# Patient Record
Sex: Male | Born: 1940 | Race: White | Hispanic: No | Smoking: Former smoker
Health system: Southern US, Community
[De-identification: ages and names within clinical notes are randomized; demographics above are authoritative.]

## PROBLEM LIST (undated history)

## (undated) DIAGNOSIS — I10 Essential (primary) hypertension: Secondary | ICD-10-CM

## (undated) DIAGNOSIS — F0390 Unspecified dementia without behavioral disturbance: Secondary | ICD-10-CM

## (undated) DIAGNOSIS — I480 Paroxysmal atrial fibrillation: Secondary | ICD-10-CM

## (undated) DIAGNOSIS — E785 Hyperlipidemia, unspecified: Secondary | ICD-10-CM

## (undated) DIAGNOSIS — N400 Enlarged prostate without lower urinary tract symptoms: Secondary | ICD-10-CM

## (undated) DIAGNOSIS — I4891 Unspecified atrial fibrillation: Secondary | ICD-10-CM

## (undated) DIAGNOSIS — M4802 Spinal stenosis, cervical region: Secondary | ICD-10-CM

## (undated) DIAGNOSIS — G4733 Obstructive sleep apnea (adult) (pediatric): Secondary | ICD-10-CM

## (undated) DIAGNOSIS — T7840XA Allergy, unspecified, initial encounter: Secondary | ICD-10-CM

## (undated) DIAGNOSIS — K2270 Barrett's esophagus without dysplasia: Secondary | ICD-10-CM

## (undated) DIAGNOSIS — K449 Diaphragmatic hernia without obstruction or gangrene: Secondary | ICD-10-CM

## (undated) DIAGNOSIS — Z5189 Encounter for other specified aftercare: Secondary | ICD-10-CM

## (undated) DIAGNOSIS — G4730 Sleep apnea, unspecified: Secondary | ICD-10-CM

## (undated) DIAGNOSIS — N529 Male erectile dysfunction, unspecified: Secondary | ICD-10-CM

## (undated) DIAGNOSIS — I499 Cardiac arrhythmia, unspecified: Secondary | ICD-10-CM

## (undated) DIAGNOSIS — M2041 Other hammer toe(s) (acquired), right foot: Secondary | ICD-10-CM

## (undated) DIAGNOSIS — K219 Gastro-esophageal reflux disease without esophagitis: Secondary | ICD-10-CM

## (undated) HISTORY — DX: Unspecified dementia, unspecified severity, without behavioral disturbance, psychotic disturbance, mood disturbance, and anxiety: F03.90

## (undated) HISTORY — PX: EYE SURGERY: SHX253

## (undated) HISTORY — DX: Obstructive sleep apnea (adult) (pediatric): G47.33

## (undated) HISTORY — PX: OTHER SURGICAL HISTORY: SHX169

## (undated) HISTORY — DX: Barrett's esophagus without dysplasia: K22.70

## (undated) HISTORY — DX: Hyperlipidemia, unspecified: E78.5

## (undated) HISTORY — PX: BASAL CELL CARCINOMA EXCISION: SHX1214

## (undated) HISTORY — DX: Diaphragmatic hernia without obstruction or gangrene: K44.9

## (undated) HISTORY — DX: Paroxysmal atrial fibrillation: I48.0

## (undated) HISTORY — DX: Allergy, unspecified, initial encounter: T78.40XA

## (undated) HISTORY — DX: Gastro-esophageal reflux disease without esophagitis: K21.9

## (undated) HISTORY — DX: Unspecified atrial fibrillation: I48.91

## (undated) HISTORY — DX: Essential (primary) hypertension: I10

## (undated) HISTORY — PX: TONSILLECTOMY: SUR1361

## (undated) HISTORY — DX: Spinal stenosis, cervical region: M48.02

## (undated) HISTORY — DX: Cardiac arrhythmia, unspecified: I49.9

## (undated) HISTORY — DX: Benign prostatic hyperplasia without lower urinary tract symptoms: N40.0

## (undated) HISTORY — DX: Male erectile dysfunction, unspecified: N52.9

## (undated) HISTORY — DX: Sleep apnea, unspecified: G47.30

## (undated) HISTORY — DX: Encounter for other specified aftercare: Z51.89

---

## 1994-10-26 ENCOUNTER — Encounter: Payer: Self-pay | Admitting: Gastroenterology

## 1998-08-24 ENCOUNTER — Ambulatory Visit (HOSPITAL_COMMUNITY): Admission: RE | Admit: 1998-08-24 | Discharge: 1998-08-24 | Payer: Self-pay | Admitting: Internal Medicine

## 1998-08-24 ENCOUNTER — Encounter: Payer: Self-pay | Admitting: Internal Medicine

## 1999-10-06 ENCOUNTER — Encounter: Payer: Self-pay | Admitting: Gastroenterology

## 1999-10-11 ENCOUNTER — Encounter: Payer: Self-pay | Admitting: Gastroenterology

## 1999-10-11 ENCOUNTER — Ambulatory Visit (HOSPITAL_COMMUNITY): Admission: RE | Admit: 1999-10-11 | Discharge: 1999-10-11 | Payer: Self-pay | Admitting: Gastroenterology

## 1999-12-30 HISTORY — PX: EXPLORATORY LAPAROTOMY W/ BOWEL RESECTION: SHX1544

## 1999-12-30 HISTORY — PX: OTHER SURGICAL HISTORY: SHX169

## 2000-01-05 ENCOUNTER — Encounter: Payer: Self-pay | Admitting: General Surgery

## 2000-01-12 ENCOUNTER — Inpatient Hospital Stay (HOSPITAL_COMMUNITY): Admission: RE | Admit: 2000-01-12 | Discharge: 2000-01-18 | Payer: Self-pay | Admitting: General Surgery

## 2000-01-12 ENCOUNTER — Encounter: Payer: Self-pay | Admitting: Gastroenterology

## 2000-01-12 ENCOUNTER — Encounter (INDEPENDENT_AMBULATORY_CARE_PROVIDER_SITE_OTHER): Payer: Self-pay | Admitting: Specialist

## 2000-01-18 ENCOUNTER — Encounter: Payer: Self-pay | Admitting: Gastroenterology

## 2000-07-04 ENCOUNTER — Encounter (INDEPENDENT_AMBULATORY_CARE_PROVIDER_SITE_OTHER): Payer: Self-pay

## 2000-07-04 ENCOUNTER — Other Ambulatory Visit: Admission: RE | Admit: 2000-07-04 | Discharge: 2000-07-04 | Payer: Self-pay | Admitting: Gastroenterology

## 2000-07-04 ENCOUNTER — Encounter: Payer: Self-pay | Admitting: Gastroenterology

## 2004-01-26 ENCOUNTER — Ambulatory Visit: Payer: Self-pay | Admitting: Cardiology

## 2004-02-16 ENCOUNTER — Ambulatory Visit: Payer: Self-pay

## 2004-03-18 ENCOUNTER — Ambulatory Visit: Payer: Self-pay | Admitting: Gastroenterology

## 2004-04-05 ENCOUNTER — Ambulatory Visit: Payer: Self-pay | Admitting: Gastroenterology

## 2005-10-26 ENCOUNTER — Ambulatory Visit: Payer: Self-pay | Admitting: Cardiology

## 2005-11-08 ENCOUNTER — Ambulatory Visit: Payer: Self-pay

## 2005-11-08 ENCOUNTER — Encounter: Payer: Self-pay | Admitting: Cardiology

## 2005-11-08 ENCOUNTER — Ambulatory Visit: Payer: Self-pay | Admitting: Cardiology

## 2005-11-10 ENCOUNTER — Ambulatory Visit: Payer: Self-pay | Admitting: Cardiology

## 2005-12-07 ENCOUNTER — Ambulatory Visit: Payer: Self-pay | Admitting: Cardiology

## 2006-06-05 ENCOUNTER — Ambulatory Visit: Payer: Self-pay | Admitting: Gastroenterology

## 2006-08-04 ENCOUNTER — Inpatient Hospital Stay (HOSPITAL_COMMUNITY): Admission: AD | Admit: 2006-08-04 | Discharge: 2006-08-05 | Payer: Self-pay | Admitting: Cardiology

## 2006-08-04 ENCOUNTER — Ambulatory Visit: Payer: Self-pay | Admitting: Cardiology

## 2006-08-08 ENCOUNTER — Ambulatory Visit: Payer: Self-pay | Admitting: Cardiology

## 2006-08-09 ENCOUNTER — Ambulatory Visit: Payer: Self-pay | Admitting: Cardiology

## 2006-08-14 ENCOUNTER — Ambulatory Visit: Payer: Self-pay | Admitting: Cardiology

## 2006-08-21 ENCOUNTER — Ambulatory Visit: Payer: Self-pay | Admitting: Cardiovascular Disease

## 2006-08-28 ENCOUNTER — Ambulatory Visit: Payer: Self-pay | Admitting: Cardiology

## 2006-09-05 ENCOUNTER — Ambulatory Visit: Payer: Self-pay | Admitting: Cardiology

## 2006-09-11 ENCOUNTER — Ambulatory Visit: Payer: Self-pay | Admitting: Cardiovascular Disease

## 2006-09-26 ENCOUNTER — Ambulatory Visit: Payer: Self-pay | Admitting: Cardiology

## 2006-10-16 ENCOUNTER — Ambulatory Visit: Payer: Self-pay | Admitting: Cardiology

## 2006-12-27 ENCOUNTER — Ambulatory Visit: Payer: Self-pay | Admitting: Cardiology

## 2007-05-08 ENCOUNTER — Ambulatory Visit: Payer: Self-pay | Admitting: Gastroenterology

## 2007-08-14 ENCOUNTER — Telehealth: Payer: Self-pay | Admitting: Gastroenterology

## 2007-09-10 ENCOUNTER — Ambulatory Visit: Payer: Self-pay | Admitting: Gastroenterology

## 2007-09-24 ENCOUNTER — Encounter: Payer: Self-pay | Admitting: Gastroenterology

## 2007-09-24 ENCOUNTER — Ambulatory Visit: Payer: Self-pay | Admitting: Gastroenterology

## 2007-09-25 ENCOUNTER — Encounter: Payer: Self-pay | Admitting: Gastroenterology

## 2007-10-08 ENCOUNTER — Ambulatory Visit: Payer: Self-pay | Admitting: Cardiology

## 2007-10-22 ENCOUNTER — Ambulatory Visit: Payer: Self-pay

## 2007-10-22 ENCOUNTER — Encounter: Payer: Self-pay | Admitting: Cardiology

## 2007-11-28 ENCOUNTER — Ambulatory Visit (HOSPITAL_COMMUNITY): Admission: RE | Admit: 2007-11-28 | Discharge: 2007-11-28 | Payer: Self-pay | Admitting: Urology

## 2007-12-12 ENCOUNTER — Ambulatory Visit (HOSPITAL_COMMUNITY): Admission: RE | Admit: 2007-12-12 | Discharge: 2007-12-12 | Payer: Self-pay | Admitting: Urology

## 2007-12-12 ENCOUNTER — Encounter (INDEPENDENT_AMBULATORY_CARE_PROVIDER_SITE_OTHER): Payer: Self-pay | Admitting: Interventional Radiology

## 2008-03-04 ENCOUNTER — Encounter: Admission: RE | Admit: 2008-03-04 | Discharge: 2008-03-04 | Payer: Self-pay | Admitting: Urology

## 2008-06-23 ENCOUNTER — Encounter: Payer: Self-pay | Admitting: Cardiology

## 2008-07-29 DIAGNOSIS — I4891 Unspecified atrial fibrillation: Secondary | ICD-10-CM

## 2008-07-29 DIAGNOSIS — Z87898 Personal history of other specified conditions: Secondary | ICD-10-CM

## 2008-07-29 DIAGNOSIS — K219 Gastro-esophageal reflux disease without esophagitis: Secondary | ICD-10-CM | POA: Insufficient documentation

## 2008-09-12 ENCOUNTER — Telehealth: Payer: Self-pay | Admitting: Cardiology

## 2008-10-14 ENCOUNTER — Ambulatory Visit: Payer: Self-pay | Admitting: Cardiology

## 2009-02-09 ENCOUNTER — Telehealth: Payer: Self-pay | Admitting: Gastroenterology

## 2009-03-11 ENCOUNTER — Ambulatory Visit: Payer: Self-pay | Admitting: Gastroenterology

## 2009-03-11 DIAGNOSIS — K2270 Barrett's esophagus without dysplasia: Secondary | ICD-10-CM

## 2009-07-07 ENCOUNTER — Telehealth: Payer: Self-pay | Admitting: Gastroenterology

## 2009-07-20 ENCOUNTER — Encounter (INDEPENDENT_AMBULATORY_CARE_PROVIDER_SITE_OTHER): Payer: Self-pay | Admitting: *Deleted

## 2009-07-23 ENCOUNTER — Ambulatory Visit: Payer: Self-pay | Admitting: Gastroenterology

## 2009-08-05 ENCOUNTER — Ambulatory Visit: Payer: Self-pay | Admitting: Gastroenterology

## 2009-08-07 ENCOUNTER — Encounter: Payer: Self-pay | Admitting: Gastroenterology

## 2009-09-30 ENCOUNTER — Ambulatory Visit: Payer: Self-pay | Admitting: Cardiology

## 2010-03-21 ENCOUNTER — Encounter: Payer: Self-pay | Admitting: Cardiology

## 2010-03-21 ENCOUNTER — Encounter: Payer: Self-pay | Admitting: Interventional Radiology

## 2010-03-30 NOTE — Letter (Signed)
Summary: Laparotomy,sm bowel resection,umbilical hernia/[facility]  Laparotomy,sm bowel resection,umbilical hernia/[facility]   Imported By: Sherian Rein 03/12/2009 07:24:11  _____________________________________________________________________  External Attachment:    Type:   Image     Comment:   External Document

## 2010-03-30 NOTE — Letter (Signed)
Summary: [facility] Instructions  [facility]  [address] [place], [place] [address]   Phone: [phone]  Fax: [phone]       [name]    [birth year]    MRN: [id number]        Procedure Day Dorna Bloom:  [facility]  08/05/09     Arrival Time:  12:30PM     Procedure Time:  1:30PM     Location of Procedure:                    _X _  [facility] (4th Floor)   PREPARATION FOR COLONOSCOPY WITH MOVIPREP   Starting 5 days prior to your procedure 07/31/09 do not eat nuts, seeds, popcorn, corn, beans, peas,  salads, or any raw vegetables.  Do not take any fiber supplements (e.g. Metamucil, Citrucel, and Benefiber).  THE DAY BEFORE YOUR PROCEDURE         DATE: 08/04/09  DAY: TUESDAY  1.  Drink clear liquids the entire day-NO SOLID FOOD  2.  Do not drink anything colored red or purple.  Avoid juices with pulp.  No orange juice.  3.  Drink at least 64 oz. (8 glasses) of fluid/clear liquids during the day to prevent dehydration and help the prep work efficiently.  CLEAR LIQUIDS INCLUDE: Water Jello Ice Popsicles Tea (sugar ok, no milk/cream) Powdered fruit flavored drinks Coffee (sugar ok, no milk/cream) Gatorade Juice: apple, white grape, white cranberry  Lemonade Clear bullion, consomm, broth Carbonated beverages (any kind) Strained chicken noodle soup Hard Candy                             4.  In the morning, mix first dose of MoviPrep solution:    Empty 1 Pouch A and 1 Pouch B into the disposable container    Add lukewarm drinking water to the top line of the container. Mix to dissolve    Refrigerate (mixed solution should be used within 24 hrs)  5.  Begin drinking the prep at 5:00 p.m. The MoviPrep container is divided by 4 marks.   Every 15 minutes drink the solution down to the next mark (approximately 8 oz) until the full liter is complete.   6.  Follow completed prep with 16 oz of clear liquid of your choice (Nothing red or purple).   Continue to drink clear liquids until bedtime.  7.  Before going to bed, mix second dose of MoviPrep solution:    Empty 1 Pouch A and 1 Pouch B into the disposable container    Add lukewarm drinking water to the top line of the container. Mix to dissolve    Refrigerate  THE DAY OF YOUR PROCEDURE      DATE: 08/05/09  DAY: WEDNESDAY  Beginning at 8:30AM (5 hours before procedure):         1. Every 15 minutes, drink the solution down to the next mark (approx 8 oz) until the full liter is complete.  2. Follow completed prep with 16 oz. of clear liquid of your choice.    3. You may drink clear liquids until 11:30AM (2 HOURS BEFORE PROCEDURE).   MEDICATION INSTRUCTIONS  Unless otherwise instructed, you should take regular prescription medications with a small sip of water   as early as possible the morning of your procedure.   Additional medication instructions: _May take regular meds.         OTHER  INSTRUCTIONS  You will need a responsible adult at least 72 years of age to accompany you and drive you home.   This person must remain in the waiting room during your procedure.  Wear loose fitting clothing that is easily removed.  Leave jewelry and other valuables at home.  However, you may wish to bring a book to read or  an iPod/MP3 player to listen to music as you wait for your procedure to start.  Remove all body piercing jewelry and leave at home.  Total time from sign-in until discharge is approximately 2-3 hours.  You should go home directly after your procedure and rest.  You can resume normal activities the  day after your procedure.  The day of your procedure you should not:   Drive   Make legal decisions   Operate machinery   Drink alcohol   Return to work  You will receive specific instructions about eating, activities and medications before you leave.    The above instructions have been reviewed and explained to me by   Clide Cliff,  RN_______________________    I fully understand and can verbalize these instructions _____________________________ Date _________

## 2010-03-30 NOTE — Procedures (Signed)
Summary: EGD and biopsy   EGD  Procedure date:  07/04/2000  Findings:      Findings: Esophagitis  Location: [facility]    EGD  Procedure date:  07/04/2000  Findings:      Findings: Esophagitis  Location: [facility]   Patient Name: [name], [name] MRN:  Procedure Procedures: Panendoscopy (EGD) CPT: 43235.    with biopsy(s)/brushing(s). CPT: D1846139.  Personnel: Endoscopist: Venita Lick. Russella Dar, MD, Clementeen Graham.  Referred By: Jonita Albee, MD.  Exam Location: Exam performed in Outpatient Clinic. Outpatient  Patient Consent: Procedure, Alternatives, Risks and Benefits discussed, consent obtained, from patient.  Indications  Evaluation of: Positive fecal occult blood test  Symptoms: Reflux symptoms  History  Pre-Exam Physical: Performed Jul 04, 2000  Cardio-pulmonary exam, HEENT exam, Abdominal exam, Extremity exam, Neurological exam, Mental status exam WNL.  Exam Exam Info: Maximum depth of insertion Duodenum, intended Duodenum. Patient position: on left side. Vocal cords not visualized. Gastric retroflexion performed. ASA Classification: II. Tolerance: excellent.  Sedation Meds: Fentanyl 50 mcg. Versed 5 mg. Cetacaine Spray 2 sprays  Monitoring: BP and pulse monitoring done. Oximetry used. Supplemental O2 given  Findings ESOPHAGEAL INFLAMMATION: [place] Classification: Grade A. Biopsy/Esoph Inflamtn taken. ICD9: Esophagitis, Reflux: 530.11. Comments: R/O short segment Barrett's.  HIATAL HERNIA: Diaphragm 41 cm from mouth. Z-line/GE Junction 39 cm from mouth. Regular, ICD9: Hernia, Hiatal: 553.3.  Assessment Abnormal examination, see findings above.  Diagnoses: 530.11: Esophagitis, Reflux.  553.3: Hernia, Hiatal.   Events  Unplanned Intervention: No unplanned interventions were required.  Unplanned Events: There were no complications. Plans Medication(s): Await pathology. Continue current medications. PPI:  Pantoprazole/Protonix 40 mg QD, for indefinitely.   Patient Education: Patient given standard instructions for: Reflux.  Disposition: After procedure patient sent to recovery. After recovery patient sent home.  Scheduling: Referring provider, to Jonita Albee, MD, Jul 05, 2000.  Clinic Visit, to [facility] T. Russella Dar, MD, [facility], around Jul 04, 2001.    This report was created from the original endoscopy report, which was reviewed and signed by the above listed endoscopist.    cc: Jonita Albee, MD       SP Surgical Pathology - STATUS: Final             By: Guilford Shi MD , Clovis Pu       Perform Date: 7 May02 15:42  Ordered By: Rica Records Date:  Facility: [facility]                              Department: CPATH  Service Report Text  [facility]   [address] [place], [place] [address]   [phone]    REPORT OF SURGICAL PATHOLOGY    Case #: [id number]   Patient Name: [name]   PID: [id number]   Pathologist: Marcie Bal, MD   DOB/Age [birth year] (Age: 70) Gender: M   Date Taken: 07/04/2000   Date Received: 07/04/2000    FINAL DIAGNOSIS    ***MICROSCOPIC EXAMINATION AND DIAGNOSIS***    ESOPHAGUS: 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 NO DYSPLASIA OR MALIGNANCY IDENTIFIED.    COMMENT   An Alcian Blue stain is performed to determine the presence of   intestinal metaplasia. The Alcian Blue stain shows intestinal   metaplasia. The control stained appropriately.    smr  Date Reported: 07/05/2000 Marcie Bal, MD   *** Electronically Signed Out By TAZ ***    Clinical information   R/O Barrett' s vs. esophagitis. (tmc)    specimen(s) obtained   Esophagus, biopsy    Gross Description   Esophagus, biopsy:   Received in formalin are tan, soft tissue fragments that are   submitted in toto. Number: 2   Size: 0.4 cm each (DB:atb 5/7)    ab/

## 2010-03-30 NOTE — Procedures (Signed)
Summary: Colonoscopy  Patient: [name] Note: All result statuses are Final unless otherwise noted.  Tests: (1) Colonoscopy (COL)   COL Colonoscopy           DONE     [facility]     [address]     [place], [place]  [address]           COLONOSCOPY PROCEDURE REPORT           PATIENT:  [name], [name]  MR#:  [id number]     BIRTHDATE:  [birth year], 68 yrs. old  GENDER:  male     ENDOSCOPIST:  [name], MD     PROCEDURE DATE:  08/05/2009     PROCEDURE:  Colonoscopy 21308     ASA CLASS:  Class II     INDICATIONS:  1) FOBT positive stool     MEDICATIONS:   Fentanyl 100 mcg IV, Versed 9 mg IV     DESCRIPTION OF PROCEDURE:   After the risks benefits and     alternatives of the procedure were thoroughly explained, informed     consent was obtained.  Digital rectal exam was performed and     revealed no abnormalities.   The LB [id number]-[id number] endoscope     was introduced through the anus and advanced to the cecum, which     was identified by both the appendix and ileocecal valve, limited     by severe spasm; unable to pass scope.    The quality of the prep     was good, using MoviPrep.  The instrument was then slowly     withdrawn as the colon was fully examined.     <<PROCEDUREIMAGES>>     FINDINGS:  A normal appearing cecum, ileocecal valve, and     appendiceal orifice were identified. The ascending, hepatic     flexure, transverse, splenic flexure, descending, sigmoid colon,     and rectum appeared unremarkable. Retroflexed views in the rectum     revealed no abnormalities. The time to cecum =  4  minutes. The     scope was then withdrawn (time =  10.75  min) from the patient and     the procedure completed.           COMPLICATIONS:  None           ENDOSCOPIC IMPRESSION:     1) Normal colon           RECOMMENDATIONS:     1) Continue current colorectal screening recommendations for     "routine risk" patients with a repeat colonoscopy in 10 years.     2)  Upper endoscopy today           Batina Dougan T. Russella Dar, MD, Clementeen Graham           CC: Robert Bellow, MD           n.     Rosalie DoctorVenita Lick. Kahlen Morais at 08/05/2009 02:05 PM           Gwendolyn Lima, [phone]  Note: An exclamation mark (!) indicates a result that was not dispersed into the flowsheet. Document Creation Date: 08/05/2009 2:07 PM _______________________________________________________________________  (1) Order result status: Final Collection or observation date-time: 08/05/2009 14:03 Requested date-time:  Receipt date-time:  Reported date-time:  Referring Physician:   Ordering Physician: [name] [phone]) Specimen Source:  Source: Launa Grill Order Number: [id number] Lab site:   Appended Document: Colonoscopy  Clinical Lists Changes  Observations: Added new observation of COLONNXTDUE: 07/2019 (08/05/2009 14:23)

## 2010-03-30 NOTE — Assessment & Plan Note (Signed)
Summary: YEARLY CHECK UP & MED REFILLS/YF   History of Present Illness Visit Type: Follow-up Visit Primary GI MD: Elie Goody MD [facility] Primary Provider: Robert Bellow, MD Chief Complaint: refill of Prevacid  pt. complains of dry mouth/throat in middle of night History of Present Illness:   This is a 70 year old male with Barrett's esophagus. He has noted frequent problems with a dry mouth in the middle of the night. He denies any reflux symptoms or any other gastrointestinal complaints. He is due screening colonoscopy in August 2011. He would like to coordinate the timing of his Barrett surveillance and screening colonoscopy if possible.   GI Review of Systems      Denies abdominal pain, acid reflux, belching, bloating, chest pain, dysphagia with liquids, dysphagia with solids, heartburn, loss of appetite, nausea, vomiting, vomiting blood, weight loss, and  weight gain.        Denies anal fissure, black tarry stools, change in bowel habit, constipation, diarrhea, diverticulosis, fecal incontinence, heme positive stool, hemorrhoids, irritable bowel syndrome, jaundice, light color stool, liver problems, rectal bleeding, and  rectal pain. Current Medications (verified): 1)  Prevacid Solutab 30 Mg  Tbdp (Lansoprazole) .... One Tablet Under Tongue Every Morning 2)  Metoprolol Tartrate 50 Mg Tabs (Metoprolol Tartrate) .... 1/2 Tab Two Times A Day 3)  Aspirin Ec 325 Mg Tbec (Aspirin) .... Take One Tablet By Mouth Daily 4)  Doxazosin Mesylate 4 Mg Tabs (Doxazosin Mesylate) .... 1/2 Tab Every Other Day 5)  Avodart 0.5 Mg Caps (Dutasteride) .Marland Kitchen.. 1 Cap Once Daily 6)  Multivitamins   Tabs (Multiple Vitamin) .Marland Kitchen.. 1 Tab Once Daily 7)  Fish Oil 1000 Mg Caps (Omega-3 Fatty Acids) .Marland Kitchen.. 1 Cap Weekly  Allergies (verified): No Known Drug Allergies  Past History:  Past Medical History: PAROXYSMAL ATRIAL FIBRILLATION (ICD-427.31) BENIGN PROSTATIC HYPERTROPHY, HX OF (ICD-V13.8) GERD  (ICD-530.81) Right renal mass, followed by Dr. Wanda Plump.  Barretts Esophagus Hiatal hernia  Past Surgical History: Exploratory laparotomy, segmental small bowel resection of a hemangioma, repair of incarcerated umbilical hernia, 12/1999 Renal Cancer Surgery RF Ablation  Family History: Family History of CVA or Stroke: Mother deceased at 74 stroke Father died at 4 of old age. Siblings:  sister is alive and well, brother is obese but otherwise  well.  No FH of Colon Cancer:  Social History: Tobacco Use - No.  Alcohol Use - no Regular Exercise - yes Drug Use - no Divorced 3 girls 1 boy Occupation: TV Producer/construction   Review of Systems       The patient complains of allergy/sinus, arthritis/joint pain, sore throat, and urination - excessive.         The pertinent positives and negatives are noted as above and in the HPI. All other ROS were reviewed and were negative.   Vital Signs:  Patient profile:   70 year old male Height:      70 inches Weight:      200.25 pounds BMI:     28.84 Pulse rate:   60 / minute Pulse rhythm:   regular BP sitting:   126 / 70  (left arm)  Vitals Entered By: Milford Cage NCMA (March 11, 2009 11:42 AM)  Physical Exam  General:  Well developed, well nourished, no acute distress. Head:  Normocephalic and atraumatic. Eyes:  PERRLA, no icterus. Mouth:  No deformity or lesions, dentition normal. Lungs:  Clear throughout to auscultation. Heart:  Regular rate and rhythm; no murmurs, rubs,  or bruits. Abdomen:  Soft, nontender  and nondistended. No masses, hepatosplenomegaly or hernias noted. Normal bowel sounds. Psych:  Alert and cooperative. Normal mood and affect.  Impression & Recommendations:  Problem # 1:  BARRETT'S ESOPHAGUS (ICD-530.85) Continue long-term antireflux measures and a daily proton pump inhibitor. Refill, Prevacid solid tabs. This is not well covered by his insurance she will contact us potentially changed grams.  Change surveillance endoscopy to August 2011 at the time of a screening colonoscopy.  Problem # 2:  SCREENING COLORECTAL-CANCER (ICD-V76.51) A ten-year screening examination is due in August of this year.  Patient Instructions: 1)  Avoid foods high in acid content ( tomatoes, citrus juices, spicy foods) . Avoid eating within 3 to 4 hours of lying down or before exercising. Do not over eat; try smaller more frequent meals. Elevate head of bed four inches when sleeping.  2)  Please schedule a follow-up appointment in 1 year. 3)  Copy sent to : Robert Bellow, MD  Appended Document: YEARLY CHECK UP & MED REFILLS/YF    Clinical Lists Changes  Medications: Changed medication from PREVACID SOLUTAB 30 MG  TBDP (LANSOPRAZOLE) one tablet under tongue every morning to PREVACID SOLUTAB 30 MG  TBDP (LANSOPRAZOLE) one tablet under tongue every morning - Signed Rx of PREVACID SOLUTAB 30 MG  TBDP (LANSOPRAZOLE) one tablet under tongue every morning;  #30 x 11;  Signed;  Entered by: Christie Nottingham CMA (AAMA);  Authorized by: Meryl Dare MD [facility]  Method used: Electronically to [facility]. [phone]*, [address], [place], [place], [place]  [address], Ph: [phone], Fax: [phone] Observations: Added new observation of EGD DUE: 09/2009 (03/11/2009 13:07) Added new observation of COLONNXTDUE: 09/2009 (03/11/2009 13:07)    Prescriptions: PREVACID SOLUTAB 30 MG  TBDP (LANSOPRAZOLE) one tablet under tongue every morning  #30 x 11   Entered by:   Christie Nottingham CMA (AAMA)   Authorized by:   Meryl Dare MD [facility]   Signed by:   Christie Nottingham CMA (AAMA) on 03/11/2009   Method used:   Electronically to        [facility]. [phone]* (retail)       [address]       [place], [place]  [address]       Ph: [phone]       Fax: [phone]   RxID:   [id number]

## 2010-03-30 NOTE — Procedures (Signed)
Summary: [facility]   Imported By: Sherian Rein 03/12/2009 07:28:36  _____________________________________________________________________  External Attachment:    Type:   Image     Comment:   External Document

## 2010-03-30 NOTE — Letter (Signed)
Summary: Patient Notice-Endo Biopsy Results  [facility]  [address] [place], [place] [address]   Phone: [phone]  Fax: [phone]        August 07, 2009 MRN: [id number]    [name] [address] [place], [place]  [address]    Dear Mr. PULEO,  I am pleased to inform you that the biopsies taken during your recent endoscopic examination did not show any evidence of cancer upon pathologic examination. The biopsies showed changes of acid reflux.  Continue with the treatment plan as outlined on the day of your      exam.  You should have a repeat endoscopic examination for Barretts in 3 years.  Please call us if you are having persistent problems or have questions about your condition that have not been fully answered at this time.  Sincerely,  Meryl Dare MD [facility]  This letter has been electronically signed by your physician.  Appended Document: Patient Notice-Endo Biopsy Results letter mailed.

## 2010-03-30 NOTE — Miscellaneous (Signed)
Summary: previsit  Clinical Lists Changes  Medications: Added new medication of MOVIPREP 100 GM  SOLR (PEG-KCL-NACL-NASULF-NA ASC-C) As directed - Signed Rx of MOVIPREP 100 GM  SOLR (PEG-KCL-NACL-NASULF-NA ASC-C) As directed;  #1 x 0;  Signed;  Entered by: Clide Cliff RN;  Authorized by: Meryl Dare MD [facility];  Method used: Electronically to [facility]. [phone]*, [address] [place], [place], [place]  [address], Ph: [phone], Fax: [phone]    Prescriptions: MOVIPREP 100 GM  SOLR (PEG-KCL-NACL-NASULF-NA ASC-C) As directed  #1 x 0   Entered by:   Clide Cliff RN   Authorized by:   Meryl Dare MD [facility]   Signed by:   Clide Cliff RN on 07/23/2009   Method used:   Electronically to        [facility]. [phone]* (retail)       [address]       [place], [place]  [address]       Ph: [phone]       Fax: [phone]   RxID:   [id number]

## 2010-03-30 NOTE — Assessment & Plan Note (Signed)
Summary: F1Y/ANAS  Medications Added METOPROLOL TARTRATE 50 MG TABS (METOPROLOL TARTRATE) 1 tab qam...1/2 tab at bedtime DOXAZOSIN MESYLATE 4 MG TABS (DOXAZOSIN MESYLATE) 1/2 tab every other day VITAMIN E 400 UNIT CAPS (VITAMIN E) 1 cap weekly      Allergies Added: NKDA  Visit Type:  1 yr f/u Primary Provider:  Robert Bellow, MD  CC:  no cardiac complaints toda y.  History of Present Illness: [name] returns today for evaluation of his paroxysmal atrial fibrillation. He had an episode about 6 months ago. Dr. Perrin Maltese increased his metoprolol to 50 mg in the morning and 25 mg at bedtime. He has had no further breakthroughs.  He remains on aspirin.  He exercises almost on a daily basis. He works out really hard every other day. No complaints of dyspnea, chest pain, or palpitations  Current Medications (verified): 1)  Prevacid Solutab 30 Mg  Tbdp (Lansoprazole) .... One Tablet Under Tongue Every Morning 2)  Metoprolol Tartrate 50 Mg Tabs (Metoprolol Tartrate) .Marland Kitchen.. 1 Tab Qam...1/2 Tab At Bedtime 3)  Aspirin Ec 325 Mg Tbec (Aspirin) .... Take One Tablet By Mouth Daily 4)  Doxazosin Mesylate 4 Mg Tabs (Doxazosin Mesylate) .... 1/2 Tab Every Other Day 5)  Avodart 0.5 Mg Caps (Dutasteride) .Marland Kitchen.. 1 Cap Once Daily 6)  Multivitamins   Tabs (Multiple Vitamin) .Marland Kitchen.. 1 Tab Once Daily 7)  Fish Oil 1000 Mg Caps (Omega-3 Fatty Acids) .Marland Kitchen.. 1 Cap Weekly 8)  Vitamin E 400 Unit Caps (Vitamin E) .Marland Kitchen.. 1 Cap Weekly  Allergies (verified): No Known Drug Allergies  Past History:  Past Medical History: Last updated: 03/11/2009 PAROXYSMAL ATRIAL FIBRILLATION (ICD-427.31) BENIGN PROSTATIC HYPERTROPHY, HX OF (ICD-V13.8) GERD (ICD-530.81) Right renal mass, followed by Dr. Wanda Plump.  Barretts Esophagus Hiatal hernia  Past Surgical History: Last updated: 03/11/2009 Exploratory laparotomy, segmental small bowel resection of a hemangioma, repair of incarcerated umbilical hernia, 12/1999 Renal Cancer Surgery  RF Ablation  Family History: Last updated: 03/11/2009 Family History of CVA or Stroke: Mother deceased at 56 stroke Father died at 62 of old age. Siblings:  sister is alive and well, brother is obese but otherwise  well.  No FH of Colon Cancer:  Social History: Last updated: 03/11/2009 Tobacco Use - No.  Alcohol Use - no Regular Exercise - yes Drug Use - no Divorced 3 girls 1 boy Occupation: TV Producer/construction   Risk Factors: Exercise: yes (07/29/2008)  Risk Factors: Smoking Status: never (07/29/2008)  Review of Systems       negative other history of present illness  Vital Signs:  Patient profile:   70 year old male Height:      70 inches Weight:      192 pounds BMI:     27.65 Pulse rate:   49 / minute Pulse rhythm:   irregular BP sitting:   140 / 80  (left arm) Cuff size:   large  Vitals Entered By: Danielle Rankin, CMA (September 30, 2009 12:25 PM)  Physical Exam  General:  Well developed, well nourished, in no acute distress. Head:  normocephalic and atraumatic Eyes:  PERRLA/EOM intact; conjunctiva and lids normal. Neck:  Neck supple, no JVD. No masses, thyromegaly or abnormal cervical nodes. Chest Wall:  no deformities or breast masses noted Lungs:  Clear bilaterally to auscultation and percussion. Heart:  Non-displaced PMI, chest non-tender; regular rate and rhythm, S1, S2 without murmurs, rubs or gallops. Carotid upstroke normal, no bruit. Normal abdominal aortic size, no bruits. Femorals normal pulses, no bruits.  Pedals normal pulses. No edema, no varicosities. Abdomen:  Bowel sounds positive; abdomen soft and non-tender without masses, organomegaly, or hernias noted. No hepatosplenomegaly. Msk:  Back normal, normal gait. Muscle strength and tone normal. Pulses:  pulses normal in all 4 extremities Extremities:  No clubbing or cyanosis. Neurologic:  Alert and oriented x 3. Skin:  Intact without lesions or rashes. Psych:  Normal affect.   Impression &  Recommendations:  Problem # 1:  PAROXYSMAL ATRIAL FIBRILLATION (ICD-427.31)  His updated medication list for this problem includes:    Metoprolol Tartrate 50 Mg Tabs (Metoprolol tartrate) .Marland Kitchen... 1 tab qam...1/2 tab at bedtime    Aspirin Ec 325 Mg Tbec (Aspirin) .Marland Kitchen... Take one tablet by mouth daily  Orders: EKG w/ Interpretation (93000)  Patient Instructions: 1)  Your physician recommends that you schedule a follow-up appointment in: 1 year with Dr. Daleen Squibb 2)  Your physician recommends that you continue on your current medications as directed. Please refer to the Current Medication list given to you today. Prescriptions: METOPROLOL TARTRATE 50 MG TABS (METOPROLOL TARTRATE) 1 tab qam...1/2 tab at bedtime  #45 x 11   Entered by:   Lisabeth Devoid RN   Authorized by:   Gaylord Shih, MD, [facility]   Signed by:   Lisabeth Devoid RN on 09/30/2009   Method used:   Electronically to        [facility]. [phone]* (retail)       [address]       [place], [place]  [address]       Ph: [phone]       Fax: [phone]   RxID:   [id number] DOXAZOSIN MESYLATE 4 MG TABS (DOXAZOSIN MESYLATE) 1/2 tab every other day  #30 x 11   Entered by:   Danielle Rankin, CMA   Authorized by:   Gaylord Shih, MD, [facility]   Signed by:   Danielle Rankin, CMA on 09/30/2009   Method used:   Electronically to        [facility]. [phone]* (retail)       [address]       [place], [place]  [address]       Ph: [phone]       Fax: [phone]   RxID:   [id number]

## 2010-03-30 NOTE — Letter (Signed)
Summary: [facility]   Imported By: Sherian Rein 03/12/2009 07:20:11  _____________________________________________________________________  External Attachment:    Type:   Image     Comment:   External Document

## 2010-03-30 NOTE — Procedures (Signed)
Summary: Upper Endoscopy  Patient: Cinque Begley Note: All result statuses are Final unless otherwise noted.  Tests: (1) Upper Endoscopy (EGD)   EGD Upper Endoscopy       DONE     [facility]     [address]     [place], [place]  [address]           ENDOSCOPY PROCEDURE REPORT           PATIENT:  [name], [name]  MR#:  [id number]     BIRTHDATE:  [birth year], 68 yrs. old  GENDER:  male     ENDOSCOPIST:  Judie Petit T. Russella Dar, MD, [facility]           PROCEDURE DATE:  08/05/2009     PROCEDURE:  EGD with biopsy     ASA CLASS:  Class II     INDICATIONS:  Barrett's Esophagus, FOBT + stool     MEDICATIONS:  There was residual sedation effect present from     prior procedure.     TOPICAL ANESTHETIC:  Exactacain Spray     DESCRIPTION OF PROCEDURE:   After the risks benefits and     alternatives of the procedure were thoroughly explained, informed     consent was obtained.  The LB [id number] endoscope was     introduced through the mouth and advanced to the second portion of     the duodenum, without limitations.  The instrument was slowly     withdrawn as the mucosa was fully examined.     <<PROCEDUREIMAGES>>     Barrett's esophagus was found in the distal esophagus. It was 2 cm     in length. Multiple biopsies were obtained and sent to pathology.     The stomach was entered and closely examined. The pylorus, antrum,     angularis, and lesser curvature were well visualized, including a     retroflexed view of the cardia and fundus. The stomach wall was     normally distensable. The scope passed easily through the pylorus     into the duodenum.  The duodenal bulb was normal in appearance, as     was the postbulbar duodenum. Retroflexed views revealed a hiatal     hernia, small. The scope was then withdrawn from the patient and     the procedure completed.     COMPLICATIONS: None           ENDOSCOPIC IMPRESSION:     1) Barrett's esophagus     2) Small hiatal hernia        RECOMMENDATIONS:     1) Anti-reflux regimen     2) PPI qam     3) Await pathology results     4) FOLLOW UP EGD FOR BARRETT'S SURVEILLANCE IN 3 YEARS IF NO     DYSPLASIA           Bailee Thall T. Russella Dar, MD, [facility]           CC:  Robert Bellow, MD           n.     Rosalie DoctorVenita Lick. Diani Jillson at 08/05/2009 02:15 PM           Gwendolyn Lima, [phone]  Note: An exclamation mark (!) indicates a result that was not dispersed into the flowsheet. Document Creation Date: 08/05/2009 2:16 PM _______________________________________________________________________  (1) Order result status: Final Collection or observation date-time: 08/05/2009 14:11 Requested date-time:  Receipt date-time:  Reported date-time:  Referring Physician:   Ordering Physician: [name] [phone]) Specimen Source:  Source: Launa Grill Order Number: [id number] Lab site:   Appended Document: Upper Endoscopy     Procedures Next Due Date:    EGD: 07/2012

## 2010-03-30 NOTE — Procedures (Signed)
Summary: Colonoscopy  Colonoscopy   Imported By: Sherian Rein 03/12/2009 08:44:10  _____________________________________________________________________  External Attachment:    Type:   Image     Comment:   External Document

## 2010-03-30 NOTE — Procedures (Signed)
Summary: EGD   EGD  Procedure date:  04/05/2004  Findings:      Barrett's, Hiatal hernia Location: [facility]    Procedures Next Due Date:    EGD: 04/2007 Patient Name: [name], [name] MRN:  Procedure Procedures: Panendoscopy (EGD) CPT: 43235.    with biopsy(s)/brushing(s). CPT: D1846139.  Personnel: Endoscopist: Venita Lick. Russella Dar, MD, [facility].  Exam Location: Exam performed in Outpatient Clinic. Outpatient  Patient Consent: Procedure, Alternatives, Risks and Benefits discussed, consent obtained, from patient. Consent was obtained by the RN.  Indications  Surveillance of: Barrett's Esophagus.  History  Current Medications: Patient is not currently taking Coumadin.  Pre-Exam Physical: Performed Apr 05, 2004  Entire physical exam was normal.  Exam Exam Info: Maximum depth of insertion Duodenum, intended Duodenum. Vocal cords not visualized. Gastric retroflexion performed. ASA Classification: II. Tolerance: excellent.  Sedation Meds: Patient assessed and found to be appropriate for moderate (conscious) sedation. Fentanyl 50 mcg. given IV. Versed 5 mg. given IV. Cetacaine Spray 2 sprays given aerosolized.  Monitoring: BP and pulse monitoring done. Oximetry used. Supplemental O2 given  Findings Normal: Proximal Esophagus to Mid Esophagus.  BARRETT'S ESOPHAGUS: Length of Barrett's 2 cm. No inflammation present. Biopsy/Barrett's taken. ICD9: Barrett's: 530.2.  HIATAL HERNIA: Regular, 3 cms. in length. ICD9: Hernia, Hiatal: 553.3. Normal: Fundus to Duodenal 2nd Portion.   Assessment  Diagnoses: 530.2: Barrett's.  553.3: Hernia, Hiatal.   Events  Unplanned Intervention: No unplanned interventions were required.  Unplanned Events: There were no complications. Plans Medication(s): Await pathology. Continue current medications. PPI: QAM, for indefinitely.   Patient Education: Patient given standard instructions for: Barrett's. Hiatal  Hernia. Reflux.  Disposition: After procedure patient sent to recovery. After recovery patient sent home.  Scheduling: EGD, to [employer]. Russella Dar, MD, [facility], around Apr 06, 2007.  Primary Care Provider, to Jonita Albee, MD,    This report was created from the original endoscopy report, which was reviewed and signed by the above listed endoscopist.    cc: Jonita Albee, MD

## 2010-03-30 NOTE — Progress Notes (Signed)
Summary: ? re recall   Phone Note Call from Patient Call back at Home Phone [phone]   Caller: Patient Call For: [facility] Reason for Call: Talk to Nurse Summary of Call: Patient wants to know if Dr Russella Dar wants to move his peocedure up do to him having blood in his stool. Patient is due for his recall in Aug [facility]) Initial call taken by: Tawni Levy,  Jul 07, 2009 11:42 AM  Follow-up for Phone Call        Left message for patient to call back Darcey Nora RN, [facility]  Jul 07, 2009 1:30 PM  Patient had a physical with Dr Perrin Maltese and had heme positive stool.  He is due for colon recall in August .  He is wondering can he just set up colon directly now or does he need a REV first.  He is only on a ASA a day.  He is having Dr Perrin Maltese send [place] a copy of the labs.  Please advise Follow-up by: Darcey Nora RN, CGRN,  Jul 07, 2009 3:23 PM  Additional Follow-up for Phone Call Additional follow up Details #1::        OK for direct COLON for 792.1 and EGD for 530.85.  Additional Follow-up by: Meryl Dare MD [facility],  Jul 07, 2009 3:40 PM    Additional Follow-up for Phone Call Additional follow up Details #2::    Colon [facility] 08/05/09 1:30, pre-visit 07/23/09 10:30 He has asked me to pick the dates for him avoiding 6/17-6/25 .  He is asked to call back if these dates are not convenient Follow-up by: Darcey Nora RN, CGRN,  Jul 07, 2009 3:58 PM

## 2010-05-27 ENCOUNTER — Observation Stay (HOSPITAL_COMMUNITY)
Admission: EM | Admit: 2010-05-27 | Discharge: 2010-05-28 | DRG: 310 | Disposition: A | Discharge status: Home / Self Care | Payer: Medicare PPO | Source: Other Acute Inpatient Hospital | Attending: Cardiology | Admitting: Cardiology

## 2010-05-27 ENCOUNTER — Telehealth: Payer: Self-pay | Admitting: Cardiology

## 2010-05-27 ENCOUNTER — Inpatient Hospital Stay (HOSPITAL_COMMUNITY): Admission: AD | Admit: 2010-05-27 | Payer: Self-pay | Source: Ambulatory Visit | Admitting: Cardiology

## 2010-05-27 DIAGNOSIS — R002 Palpitations: Secondary | ICD-10-CM

## 2010-05-27 DIAGNOSIS — K219 Gastro-esophageal reflux disease without esophagitis: Secondary | ICD-10-CM | POA: Insufficient documentation

## 2010-05-27 DIAGNOSIS — K2270 Barrett's esophagus without dysplasia: Secondary | ICD-10-CM | POA: Insufficient documentation

## 2010-05-27 DIAGNOSIS — R0989 Other specified symptoms and signs involving the circulatory and respiratory systems: Secondary | ICD-10-CM | POA: Insufficient documentation

## 2010-05-27 DIAGNOSIS — N400 Enlarged prostate without lower urinary tract symptoms: Secondary | ICD-10-CM | POA: Insufficient documentation

## 2010-05-27 DIAGNOSIS — I4891 Unspecified atrial fibrillation: Principal | ICD-10-CM | POA: Insufficient documentation

## 2010-05-27 DIAGNOSIS — Z79899 Other long term (current) drug therapy: Secondary | ICD-10-CM | POA: Insufficient documentation

## 2010-05-27 DIAGNOSIS — R0609 Other forms of dyspnea: Secondary | ICD-10-CM | POA: Insufficient documentation

## 2010-05-27 LAB — DIFFERENTIAL
Basophils Absolute: 0.1 10*3/uL (ref 0.0–0.1)
Eosinophils Absolute: 0.9 10*3/uL — ABNORMAL HIGH (ref 0.0–0.7)
Eosinophils Relative: 11 % — ABNORMAL HIGH (ref 0–5)
Lymphocytes Relative: 25 % (ref 12–46)
Neutrophils Relative %: 56 % (ref 43–77)

## 2010-05-27 LAB — CBC
Platelets: 212 10*3/uL (ref 150–400)
RDW: 13.6 % (ref 11.5–15.5)
WBC: 8 10*3/uL (ref 4.0–10.5)

## 2010-05-27 NOTE — Telephone Encounter (Signed)
lmtcb Debbie Bernarda Erck RN  

## 2010-05-28 DIAGNOSIS — I4891 Unspecified atrial fibrillation: Secondary | ICD-10-CM

## 2010-05-28 LAB — COMPREHENSIVE METABOLIC PANEL WITH GFR
ALT: 18 U/L (ref 0–53)
AST: 32 U/L (ref 0–37)
Albumin: 3.4 g/dL — ABNORMAL LOW (ref 3.5–5.2)
Alkaline Phosphatase: 54 U/L (ref 39–117)
BUN: 23 mg/dL (ref 6–23)
CO2: 27 meq/L (ref 19–32)
Calcium: 8.5 mg/dL (ref 8.4–10.5)
Chloride: 102 meq/L (ref 96–112)
Creatinine, Ser: 1.4 mg/dL (ref 0.4–1.5)
GFR calc Af Amer: >60 mL/min (ref >60)
GFR calc non Af Amer: 50 mL/min — ABNORMAL LOW (ref >60)
Glucose, Bld: 95 mg/dL (ref 70–99)
Potassium: 4.5 meq/L (ref 3.5–5.1)
Sodium: 135 meq/L (ref 135–145)
Total Bilirubin: 0.9 mg/dL (ref 0.3–1.2)
Total Protein: 6.1 g/dL (ref 6.0–8.3)

## 2010-05-28 LAB — PROTIME-INR
INR: 0.98 (ref 0.00–1.49)
Prothrombin Time: 13.2 s (ref 11.6–15.2)

## 2010-05-28 LAB — BRAIN NATRIURETIC PEPTIDE: Pro B Natriuretic peptide (BNP): 283 pg/mL — ABNORMAL HIGH (ref 0.0–100.0)

## 2010-05-28 LAB — APTT: aPTT: 28 s (ref 24–37)

## 2010-05-28 LAB — CARDIAC PANEL(CRET KIN+CKTOT+MB+TROPI)
Relative Index: INVALID (ref 0.0–2.5)
Total CK: 89 U/L (ref 7–232)

## 2010-05-28 LAB — TSH: TSH: 2.448 u[IU]/mL (ref 0.350–4.500)

## 2010-06-01 ENCOUNTER — Encounter: Payer: Self-pay | Admitting: Cardiology

## 2010-06-01 NOTE — Telephone Encounter (Signed)
Pt has question re meds. °

## 2010-06-01 NOTE — Telephone Encounter (Signed)
Returning pt call LMTCB Mylo Red RN

## 2010-06-02 ENCOUNTER — Telehealth: Payer: Self-pay | Admitting: *Deleted

## 2010-06-02 NOTE — Telephone Encounter (Signed)
Pt aware is all right to take the 3 medications as prescribed per Dr. Daleen Squibb  ( Tessalon, Qvar, & Atrovent nasal) Mylo Red RN

## 2010-06-02 NOTE — Telephone Encounter (Signed)
Pt calls today with a list of 3 new medications.  He would like Dr. Daleen Squibb to review them and make sure they are okay for him to take with his other medications. They are:  Qvar inhaler                   Tessalon  100mg                    Atrovent nasal spray  I will forward to Dr. Daleen Squibb for review and return call to pt. Mylo Red RN

## 2010-06-02 NOTE — Discharge Summary (Signed)
[name], Foster NO.:  [id number]  MEDICAL RECORD NO.:  [id number]           PATIENT TYPE:  I  LOCATION:  2012                         FACILITY:  [facility]  PHYSICIAN:  [name] Sans. Daemyn Gariepy, MD, FACCDATE OF BIRTH:  1940-09-20  DATE OF ADMISSION:  05/27/2010 DATE OF DISCHARGE:  05/28/2010                              DISCHARGE SUMMARY   PRIMARY CARDIOLOGIST:  [name] Fus C. Rhiley Tarver, MD, [facility]  DISCHARGE DIAGNOSIS:  Paroxysmal atrial fibrillation, started on Pradaxa this admission. A.  Spontaneous conversion to normal sinus rhythm.  SECONDARY DIAGNOSES: 1. Gastroesophageal reflux disease. 2. Benign prostatic hypertrophy. 3. Barrett esophagus.  PROCEDURES: NONE  REASON FOR HOSPITALIZATION:  This is a 70 year old gentleman with known paroxysmal atrial fibrillation, who presented to urgent care with complaints of atrial fibrillation.  The patient states he went into atrial fibrillation the day prior to admission, and is yet to return to normal sinus rhythm.  Heart rate elevated to 150 upon standing.  The patient was sent from the urgent care center for direct admission to [facility].  HOSPITAL COURSE:  The patient was admitted to telemetry and metoprolol was continued, was increased.  With the patient's history of paroxysmal atrial fibrillation, it was felt that he should be on anticoagulation. He was followed by anticoagulation, would elect to try Pradaxa, therefore this was initiated.  The patient was made n.p.o. after midnight for possible direct current cardioversion in the morning.  On the morning of discharge, the patient spontaneously converted to normal sinus rhythm prior to direct current cardioversion.  The patient has remained in normal sinus rhythm for approximately 5 hours at this point in time.  It was felt that the patient's initiation of albuterol inhaler that was recently started for a cough of could have deteriorate his paroxysmal atrial fibrillation,  therefore this has been discontinued. The patient states his cough has improved and does not require this medication.  Again, the patient has remained in normal sinus rhythm. Per Dr. Daleen Foster, the patient is stable for discharge as he is in normal sinus rhythm.  He will be discharged on Pradaxa with an increased dose of his metoprolol.  He will be continued on all other home medications.  DISCHARGE LABS:  Cardiac markers negative x1.  Sodium 135, potassium 4.5, BUN 23, creatinine 1.4.  WBC 8, hemoglobin 15, hematocrit 42.1, platelets 212.  DISCHARGE MEDICATIONS: 1. Pradaxa 150 mg 1 tablet twice daily. 2. Aspirin enteric-coated 81 mg 1 tablet daily. 3. Toprol-XL 50 mg 2 tablets every morning and half a tablet in the     evening. 4. Avodart 0.5 mg daily. 5. Fluticasone 1 spray daily as needed. 6. Multivitamin daily. 7. Prevacid 30 mg 1 tablet daily. 8. Please stop taking albuterol (ProAir inhaler).  FOLLOWUP PLANS AND INSTRUCTIONS: 1. The patient will follow up with Dr. Daleen Foster on June 21, 2010 at 3:15. 2. The patient showed increase activity as tolerated. 3. The patient should continue low-sodium, heart-healthy diet. 4. The patient is to call the office in the interim for any problems     or concerns.  Duration of Discharge: Greater than 30 minutes with  physician and physician extender time.   Leonette Monarch, PA-C   ______________________________ [name] Sans [name] Squibb, MD, [facility]    NB/MEDQ  D:  05/28/2010  T:  05/29/2010  Job:  [id number]  Electronically Signed by Alen Blew P.A. on 06/02/2010 10:40:49 AM Electronically Signed by [name] MD FACC on 06/02/2010 01:02:06 PM

## 2010-06-21 ENCOUNTER — Encounter: Payer: Medicare PPO | Admitting: Cardiology

## 2010-07-05 LAB — PULMONARY FUNCTION TEST

## 2010-07-09 ENCOUNTER — Telehealth: Payer: Self-pay | Admitting: Cardiology

## 2010-07-09 ENCOUNTER — Encounter: Payer: Self-pay | Admitting: *Deleted

## 2010-07-12 ENCOUNTER — Encounter: Payer: Self-pay | Admitting: Pulmonary Disease

## 2010-07-13 ENCOUNTER — Ambulatory Visit (INDEPENDENT_AMBULATORY_CARE_PROVIDER_SITE_OTHER): Payer: Medicare PPO | Admitting: Pulmonary Disease

## 2010-07-13 ENCOUNTER — Encounter: Payer: Self-pay | Admitting: Pulmonary Disease

## 2010-07-13 ENCOUNTER — Institutional Professional Consult (permissible substitution): Payer: Self-pay | Admitting: Pulmonary Disease

## 2010-07-13 DIAGNOSIS — J310 Chronic rhinitis: Secondary | ICD-10-CM

## 2010-07-13 DIAGNOSIS — J4489 Other specified chronic obstructive pulmonary disease: Secondary | ICD-10-CM

## 2010-07-13 DIAGNOSIS — R05 Cough: Secondary | ICD-10-CM

## 2010-07-13 DIAGNOSIS — K219 Gastro-esophageal reflux disease without esophagitis: Secondary | ICD-10-CM

## 2010-07-13 DIAGNOSIS — G4710 Hypersomnia, unspecified: Secondary | ICD-10-CM

## 2010-07-13 DIAGNOSIS — R059 Cough, unspecified: Secondary | ICD-10-CM | POA: Insufficient documentation

## 2010-07-13 DIAGNOSIS — J449 Chronic obstructive pulmonary disease, unspecified: Secondary | ICD-10-CM

## 2010-07-13 DIAGNOSIS — I4891 Unspecified atrial fibrillation: Secondary | ICD-10-CM

## 2010-07-13 MED ORDER — BENZONATATE 200 MG PO CAPS: 200.0000 mg | ORAL_CAPSULE | Freq: Three times a day (TID) | ORAL | Status: DC | PRN

## 2010-07-13 MED ORDER — MONTELUKAST SODIUM 10 MG PO TABS: ORAL_TABLET | ORAL | Status: DC

## 2010-07-13 NOTE — Assessment & Plan Note (Signed)
[name]                            CARDIOLOGY OFFICE NOTE   [name]                       MRN:          [id number]  DATE:10/16/2006                            DOB:          1941-02-21    SUBJECTIVE:  Mr. [name] returns today for further management of his  paroxysmal atrial fibrillation.  Since I saw him on August 28, 2006, he  has had no definite events of atrial fibrillation per his history.  He  does have brief little episodes lasting seconds of some irregularity.  He says he has had this for 30 years.   He has been on Coumadin ever since he presented to the hospital in  atrial fibrillation in June of 2008.  His INR today is 2.  He would like  very much to come off of Coumadin.   His CHAD-1 score is very low.  We have had him on aspirin in the past.  He would like to go back to 325 mg daily.   He feels like there is an association with Avodart in his atrial  fibrillation.  He stopped it on his own.  He has not consulted with Dr.  [facility], as best I can tell.   MEDICATIONS:  1. Cardura 4 mg q.h.s.  2. Vitamins, calcium, vitamin C.  3. Imipramine 175 mg q.h.s.  4. Metoprolol 25 mg p.o. twice daily.  5. Coumadin.  6. Prevacid.   OBJECTIVE:  VITAL SIGNS:  Blood pressure today 112/70, pulse 58 and  regular, weight 197, down 5 pounds.  NECK:  His carotid upstrokes are equal bilaterally without bruits.  There is no jugular venous distention.  Thyroid is not enlarged.  Trachea is midline.  LUNGS:  Clear.  HEART:  Reveals a non-displaced PMI.  Normal S1 and S2.  ABDOMEN:  Soft, good bowel sounds.  No midline bruits.  EXTREMITIES:  No clubbing, cyanosis or edema.  Pulses intact.   ASSESSMENT/PLAN:  Mr. [name] is doing well.  He has had no further  clinical episodes of paroxysmal atrial fibrillation.  He has a low [place]-  1 score.  We have switched him from Coumadin to aspirin 325 mg daily,  per his request.  I think this is  reasonable.   FOLLOWUP:  We will plan on seeing him back in six months.    Thomas C. Daleen Squibb, MD, [facility]  Electronically Signed   TCW/MedQ  DD: 10/16/2006  DT: 10/17/2006  Job #: [id number]

## 2010-07-13 NOTE — H&P (Signed)
NAME:  [name], [name] NO.:  [id number]   MEDICAL RECORD NO.:  [id number]           PATIENT TYPE:   LOCATION:                                 FACILITY:   PHYSICIAN:  Salvadore Farber, MD       DATE OF BIRTH:   DATE OF ADMISSION:  DATE OF DISCHARGE:                              HISTORY & PHYSICAL   CHIEF COMPLAINT:  Tachycardia.   HISTORY OF PRESENT ILLNESS:  [name] is a 70 year old gentleman with a  history of paroxysmal atrial fibrillation for which he is maintained on  beta blockade.  Last evening he went to check his blood pressure and  noted a rapid heart rate.  He rechecked it again this morning and noted  it to be the same, at approximately 160 beats per minute.  He has had  some very minor fatigue but no dyspnea, chest pain, or lightheadedness.   PAST MEDICAL HISTORY:  1. Paroxysmal atrial fibrillation.  2. BPH.  3. Right renal mass, followed by Dr. Wanda Plump.  4. GERD.  5. Status post abdominal surgery a number of years ago for removal of      an intestinal mass.   CURRENT MEDICATIONS:  1. Cardura 4 mg daily.  2. Prevacid daily.  3. Toprol XL 50 mg daily.  4. Aspirin 325 mg daily.   ALLERGIES:  NKDA.   SOCIAL HISTORY:  The patient exercises daily on his elliptical.  He  denies tobacco and alcohol use.   FAMILY HISTORY:  Mother died of a stroke at 18.  Father died at 65 of  old age.  A sister is alive and well.  A brother is obese but otherwise  well.   REVIEW OF SYSTEMS:  Negative in detail except as above.   PHYSICAL EXAMINATION:  GENERAL:  He is generally well-appearing and in  no distress.  VITAL SIGNS:  Heart rate 121, blood pressure 98/67.  HEENT:  Normal.  SKIN:  Normal.  MUSCULOSKELETAL:  Normal.  He has no jugular venous distension,  thyromegaly, or lymphadenopathy.  LUNGS:  Respiratory effort is normal.  Lungs are clear to auscultation.  HEART:  He is tachycardia with a regular rhythm.  No murmur or gallop.  ABDOMEN:   Soft, nondistended, nontender.  There is no  hepatosplenomegaly.  Bowel sounds normal.  EXTREMITIES:  Warm without clubbing, cyanosis, edema, or ulceration.  Carotid pulses 2+ bilaterally without bruit.  NEUROLOGIC:  He is alert and orient x3 with normal neurologic exam.   Electrocardiogram demonstrates atrial flutter with a 2:1 block and a  ventricular rate of 143 beats per minute.  There is left axis deviation  and no evidence of ischemia.   IMPRESSION:  New-onset atrial flutter with rapid ventricular response.  Blood pressure is a bit low.   RECOMMENDATIONS:  1. I will therefore admit him to the hospital for medical titration      and rate control.  2. I will initiate heparin and Coumadin.  3. Will stop his Toprol XL and use IV Cardizem to establish initial      rate  control.  4. Will hold Cardura to give some additional blood pressure room for      this.  If he is not reverted to sinus rhythm by Monday, would      consider TEE cardioversion with or without flutter ablation.      Salvadore Farber, MD  Electronically Signed     WED/MEDQ  D:  08/04/2006  T:  08/04/2006  Job:  [id number]

## 2010-07-13 NOTE — Assessment & Plan Note (Addendum)
He has a remote history of cigar smoking.  His symptoms are suggestive of asthma, and he has notice improvement in his symptoms with inhaler therapy.  He does have mild obstruction on recent spirometry.  He also has post-nasal drip, and a history of reflux.  I have written a prescription for benzonatate prn for his cough.

## 2010-07-13 NOTE — Assessment & Plan Note (Signed)
[facility]                            CARDIOLOGY OFFICE NOTE   [name], Foster                       MRN:          [id number]  DATE:08/28/2006                            DOB:          1940-10-08    [name] returns today after being discharged from the hospital with an  episode of paroxysmal atrial fibrillation.  Please see the discharge  summary for details.   He was started on anticoagulation and his INR today is 2.4.  He  continues to have episodes of paroxysmal atrial fib symptomatically.  He  had his last episode last week for about 3 hours.   He had a lot of problems with starting Avodart.  He relates specifically  4 episodes of atrial fib after starting that drug, and how it has  diminished significantly since then.   His meds are unchanged, otherwise.   His exam today, his blood pressure is 132/81, his pulse is 59 and  regular.  His weight is 202.  HEENT:  Unchanged.  NECK:  Carotid upstrokes are equal bilaterally without bruits.  There is  no JVD.  Thyroid is not enlarged, trachea is midline.  LUNGS:  Clear.  HEART:  Reveals a regular rate and rhythm without murmurs, rubs, or  gallops.  There is no displacement of his PMI.  ABDOMINAL EXAM:  Soft with good bowel sounds.  EXTREMITIES:  Reveal no edema.  Pulses are brisk.  NEURO EXAM:  Intact.   At the present time, [name] is still having episodes of paroxysmal  atrial fib.  There seems to be an association, at least with him, with  Avodart and the increased number of events.   For the time being, I have asked him to stay on Coumadin. Will see him  back in about 6 to 8 weeks to reassess.  Perhaps there is an alternative  out to Avodart.     Thomas C. Daleen Squibb, MD, [facility]  Electronically Signed    TCW/MedQ  DD: 08/28/2006  DT: 08/28/2006  Job #: [id number]   cc:   Jonita Albee, M.D.  [employer], M.D.

## 2010-07-13 NOTE — Progress Notes (Signed)
Subjective:    Patient ID: [name], male    DOB: September 09, 1940, 70 y.o.   MRN: [id number] CC: [name], [name] HPI 70 yo male with chronic cough.  He has notice a cough whenever her would get a cold since college.  After he recovered from his cold, his cough would linger on for several weeks.  This was his typical pattern until a few years ago.  He then noticed more trouble with chest congestion and getting winded.  He would also have occasional wheezing.  His cough is usually dry.  He denies hemoptysis.  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    He does get sinus congestion and post-nasal drip.  He does not think he has allergies.  He has been using flonase intermittently, and was started on zyrtec and claritin yesterday.  He has been using Qvar for several weeks, and feels like this has helped his cough and wheeze some.  He has only been using Qvar as needed, and not on a scheduled basis.  He has a history of GERD, and is followed by Dr. Russella Dar for this.  He has been using prevacid.  He denies any reflux symptoms, trouble swallowing, sore throat, globus sensation, or abdominal pain.  He was hospitalized in March 2012 for A. Fib with RVR which was felt to due to albuterol use, and as a result this medication was stopped.  He is followed by Dr. Daleen Squibb for his A. Fib.  He smoked cigars from age 79 to 4.  He used to get pneumonia frequently as a child, but was never told he had asthma before.  He owns a [employer], and produces a music television show.  He travels frequently between [place] and [place].  He denies any other recent travel.  He does not have pets, but his girlfriend has a dog.  He has not had recent sick exposures.  He does have trouble sleeping.  He snores, and will wake up with a choking sensation.  He had a sleep test years ago, and was told he has sleep apnea.  Nothing was ever done for this.  He often will get sleepy during the day, and  can fall asleep easily when sitting quiet.  He uses [place], but this does not seem to help his sleep much.  Past Medical History  Diagnosis Date  . Paroxysmal atrial fibrillation     Hospitalized on 05/27/10  . GERD (gastroesophageal reflux disease)   . BPH (benign prostatic hyperplasia)   . Barrett esophagus   . Hiatal hernia   . Renal cell carcinoma 2009    right renal mass, followed by Dr. Teodora Medici     Family History  Problem Relation Age of Onset  . Stroke Mother   . Obesity Brother   . Asthma Paternal Grandmother   . Heart attack Paternal Grandfather   . Kidney cancer Paternal Aunt      History   Social History  . Marital Status: Single    Spouse Name: N/A    Number of Children: N/A  . Years of Education: N/A   Occupational History  . televison Photographer    Social History Main Topics  . Smoking status: Former Smoker -- 10 years    Types: Cigars  . Smokeless tobacco: Never Used  . Alcohol Use: No  . Drug Use: No  . Sexually Active: Not on file   Other Topics Concern  . Not on file  Social History Narrative  . No narrative on file     Allergies  Allergen Reactions  . Proair Hfa (ZOX:[id number])     Increased heart rate     Outpatient Prescriptions Prior to Visit  Medication Sig Dispense Refill  . aspirin 81 MG EC tablet Take 81 mg by mouth daily.        . dabigatran (PRADAXA) 150 MG CAPS Take 150 mg by mouth every 12 (twelve) hours.        . dutasteride (AVODART) 0.5 MG capsule Take 0.5 mg by mouth daily.        . fluticasone (FLONASE) 50 MCG/ACT nasal spray 2 sprays by Nasal route. 1 spray daily as needed       . lansoprazole (PREVACID) 30 MG capsule Take 30 mg by mouth daily.        . metoprolol (TOPROL-XL) 50 MG 24 hr tablet Take 50 mg by mouth. 2 tablets every morning and 1/ tablet in the evening       . Multiple Vitamin (MULTIVITAMIN) tablet Take 1 tablet by mouth daily.         Review of Systems  HENT: Positive for congestion and sneezing.        Objective:   Physical Exam Filed Vitals:   07/13/10 1519 07/13/10 1532  BP:  138/80  Pulse:  58  Temp: 97.9 F (36.6 C)   TempSrc: Oral   Height: 5\' 10"  (1.778 m)   Weight: 201 lb (91.173 kg)   SpO2:  97%   General - Thin, healthy HEENT - Wears glasses, PERRLA, EOMI, Nasal septal deviation, clear sinus drainage, MP 2, decreased AP diameter of oropharynx, no LAN, no thyromegaly Cardiac - s1s2 regular, no murmur, pulses symmetric Chest - CTA Abd - soft, thin, non-tender, normal bowel sounds  Ext - no e/c/c Neuro - normal strength, CN intact, A&O x 3 Psych - normal mood/behavior Skin - no rashes  Labs from 05/27/10 reviewed and unremarkable.    Chest xray from 07/05/10 was unremarkable. Spirometry from 07/05/10>>FEV1 2.22(72%), FEV1% 69  Assessment & Plan:   Cough He has a remote history of cigar smoking.  His symptoms are suggestive of asthma, and he has notice improvement in his symptoms with inhaler therapy.  He does have mild obstruction on recent spirometry.  He also has post-nasal drip, and a history of reflux.  I have written a prescription for benzonatate prn for his cough.  Chronic obstructive asthma I have advised him to use Qvar on a scheduled basis to get maximal benefit.  I will give him a one week course of singulair.  There is concern that his recent episode of A. Fib may have been related to albuterol use, and will therefore hold off on resuming this for now.  Rhinitis Advised him to use flonase on a scheduled basis for the next several weeks.  He can use zyrtec at night for possible allergic component.  If his symptoms persist, he may need ENT evaluation due to nasal septal deviation.  Hypersomnia He reports previous sleep study years ago demonstrating sleep apnea.  He has never been on therapy for this.  He has sleep disruption, snoring, and daytime sleepiness.  He has a history of atrial fibrillation.  To further assess this will arrange for in-lab  sleep study.  GERD He is followed by Dr. Russella Dar, and is to continue with prevacid.  PAROXYSMAL ATRIAL FIBRILLATION He is followed by Dr. Daleen Squibb.    Updated Medication List Outpatient Encounter  Prescriptions as of 07/13/2010  Medication Sig Dispense Refill  . aspirin 81 MG EC tablet Take 81 mg by mouth daily.        . beclomethasone (QVAR) 80 MCG/ACT inhaler Inhale 2 puffs into the lungs 2 (two) times daily.        . cetirizine (ZYRTEC) 10 MG tablet Take 10 mg by mouth daily.        . dabigatran (PRADAXA) 150 MG CAPS Take 150 mg by mouth every 12 (twelve) hours.        Marland Kitchen doxazosin (CARDURA) 4 MG tablet Take 4 mg by mouth at bedtime.        . dutasteride (AVODART) 0.5 MG capsule Take 0.5 mg by mouth daily.        . fluticasone (FLONASE) 50 MCG/ACT nasal spray 2 sprays by Nasal route. 1 spray daily as needed       . HYDROcodone-acetaminophen (LORTAB) 7.5-500 MG per tablet As needed       . lansoprazole (PREVACID) 30 MG capsule Take 30 mg by mouth daily.        . metoprolol (TOPROL-XL) 50 MG 24 hr tablet Take 50 mg by mouth. 2 tablets every morning and 1/ tablet in the evening       . Multiple Vitamin (MULTIVITAMIN) tablet Take 1 tablet by mouth daily.        Marland Kitchen zolpidem (AMBIEN) 10 MG tablet Take 10 mg by mouth at bedtime as needed. Once a week as needed       . DISCONTD: loratadine (CLARITIN) 10 MG tablet Take 10 mg by mouth daily.        . benzonatate (TESSALON) 200 MG capsule Take 1 capsule (200 mg total) by mouth 3 (three) times daily as needed for cough.  30 capsule  1  . montelukast (SINGULAIR) 10 MG tablet One pill daily at night for one week, then as needed  30 tablet  2

## 2010-07-13 NOTE — Patient Instructions (Addendum)
Qvar two puffs twice per day, and rinse mouth after using Flonase two sprays in each nostril once daily Cetirizine 10 mg before bedtime Singulair 10 mg before bedtime for one week Will schedule sleep test Benzonatate 200 mg up to 3 times per day as needed for cough Follow up in 4 to 6 weeks

## 2010-07-13 NOTE — Progress Notes (Deleted)
Subjective:     Patient ID: [name], male   DOB: September 19, 1940, 70 y.o.   MRN: [id number]  HPI   Review of Systems  HENT: Positive for congestion and sneezing.        Objective:   Physical Exam     Assessment:     ***    Plan:     ***

## 2010-07-13 NOTE — Assessment & Plan Note (Signed)
[facility]                            CARDIOLOGY OFFICE NOTE   Foster, [name]                       MRN:          [id number]  DATE:12/27/2006                            DOB:          17-Nov-1940    Mr. [name] comes in today as an add-on because of an episode of atrial  fibrillation yesterday.   It came on unpredictably.  He had to go into urgent care, where he had  an EKG, which showed atrial fibrillation at a rate of about 105-110  beats per minute.  He had left axis deviation, no acute changes.   They gave him an extra metoprolol and he converted back to normal  rhythm.  He does not feel or have symptoms when he does convert.   He is in sinus rhythm today at a rate of 50.  He says his heart rate  usually runs around 50-60.   There is some concern whether or not he is taking metoprolol succinate  or tartrate.  He takes 50 mg each day.  On our last note, we had him  taking 25 b.i.d.  We will clarify this once he gets home.   1. He is also on multivitamin.  2. Vitamin E 400 units a day.  3. He is taking another vitamin supplement, which he asked my opinion      about today, which has 80 units of vitamin E.  4. Calcium one weekly.  5. Vitamin C weekly.  6. Prevacid unknown dose daily.  7. Enteric coated aspirin 325 a day.  8. Avodart 0.5 daily.   He denies any tachy palpitations, presyncope, syncope, orthopnea, PND,  peripheral edema.  He has had no chest pain.   EXAM TODAY:  His blood pressure is 138/80, his pulse is 50 and regular.  HEENT:  Normocephalic, atraumatic.  PERRLA.  Extraocular movements  intact.  Sclerae slightly injected.  Facial symmetry is normal.  NECK:  Supple.  Carotid upstrokes are equal bilaterally without bruits.  There is no JVD.  Thyroid is not enlarged.  Trachea is midline.  LUNGS:  Clear.  HEART:  Reveals a nondisplaced PMI.  He has a regular rate and rhythm,  occasional extrasystole.  ABDOMINAL EXAM:  Soft,  good bowel sounds, no midline bruits.  EXTREMITIES:  Without cyanosis, clubbing or edema.  Pedal pulses are  intact.  NEUROLOGIC EXAM:  Intact.  SKIN:  Shows a few ecchymoses.   Electrocardiogram again shows sinus bradycardia.   I had a long talk with [name].  I have answered all of his questions  about his vitamins, as well as tried to clarify which metoprolol he is  on.  He will call us when he gets home to make sure he is taking 50 mg  metoprolol succinate once a day.  If he is not, we will change him to  this.   I have told him that, if he has atrial fibrillation again with a heart  rate greater than 100, just to take an extra metoprolol.   I will plan on seeing him back in six  months.   If he continues to have break-throughs of frequent atrial fibrillation  that last for more than several hours, we will need to consider going  back on Coumadin.  He would rather stay on the aspirin.  He understand  the pros and cons of this.     Thomas C. Daleen Squibb, MD, [facility]  Electronically Signed    TCW/MedQ  DD: 12/27/2006  DT: 12/27/2006  Job #: [id number]   cc:   Jonita Albee, M.D.

## 2010-07-13 NOTE — Discharge Summary (Signed)
NAMEBRETTON, TANDY NO.:  [id number]   MEDICAL RECORD NO.:  [id number]          PATIENT TYPE:  INP   LOCATION:  4742                         FACILITY:  [facility]   PHYSICIAN:  Salvadore Farber, MD  DATE OF BIRTH:  [birth year]   DATE OF ADMISSION:  08/04/2006  DATE OF DISCHARGE:  08/05/2006                               DISCHARGE SUMMARY   DISCHARGE DIAGNOSES:  1. Paroxysmal atrial fib, currently normal sinus rhythm.  2. Anticoagulation therapy initiated with Coumadin and heparin during      this hospitalization.  Patient being discharged home with Coumadin.   PAST MEDICAL HISTORY:  Includes:  1. GERD with Barrett's esophagus.  2. Paroxysmal atrial fib.  3. Status post normal stress Myoview in the past.  4. Status post abdominal surgery some years ago for removal of      intestinal mass.  5. BPH.  6. Right renal mass that is being followed by Dr. Wanda Plump.   HOSPITAL COURSE:  Mr. Mckesson apparently presented to our office as a walk-  in patient on August 04, 2006.  H&P is not available at this time.  The  patient complained of less than 24 hours of rapid irregular heartbeat.  The patient apparently stated it usually stopped in a couple of hours,  but this time it did not.  He complained of mild lightheadedness on and  off.  The patient was apparently seen by Dr. Samule Ohm and admitted to [facility]  for atrial fib with rapid ventricular response.  He was anticoagulated  with heparin.  Coumadin was started per pharmacy, continued Protonix.  The patient was placed on diltiazem.  The patient was seen by Dr. Eden Emms  on day of discharge.  The patient in normal sinus rhythm, 60s to 70s.  At that time, blood pressure stable with systolic of 120.  The patient  insisting to go home. Dr. Eden Emms discharging the patient home on Toprol  XL 50 mg daily, Coumadin.  He is receiving 7.5 mg prior to discharge  today.  He will then take 5 mg every other day, rotating with 2.5 mg,  follow up  with Dr. Daleen Squibb in 1-2 weeks.  Follow up as a new patient in the  Coumadin clinic this week.  INR at time of discharge 1.9.  I have called  and left a message with the office to arrange for follow-up  appointments, as stated above.  The  patient has been given a prescription for the Coumadin, and his Toprol  XL which he was previously on.  He is to continue his aspirin at 81 mg  daily, Prevacid 30 mg daily and a multivitamin.   Duration of discharge encounter less than 30 minutes.      Dorian Pod, ACNP      Salvadore Farber, MD  Electronically Signed    MB/MEDQ  D:  08/05/2006  T:  08/05/2006  Job:  [id number]   cc:   Jonita Albee, M.D.

## 2010-07-13 NOTE — Assessment & Plan Note (Signed)
[facility]                            CARDIOLOGY OFFICE NOTE   SHAHZAIN, KIESTER                       MRN:          [id number]  DATE:10/08/2007                            DOB:          Oct 16, 1940    Mr. Hagood returns today for further management of paroxysmal atrial  fibrillation.  He has only had a couple of episodes since I saw him back  in October.  They usually are brief but on several occasions, he has had  to take a half of metoprolol.  He also takes an aspirin when he has  these events.   He denies any orthopnea, PND, or peripheral edema.  He has had no  syncope or presyncope.  He has had no anginas.   His current meds are,  1. Cardura 4 mg p.o. q.h.s.  2. Multivitamin.  3. Vitamin E.  4. Vitamin C.  5. Metoprolol 50 mg a day.  6. Prevacid 30 mg day.  7. Aspirin 325 mg a day.  8. Avodart 0.5 one p.o. daily.  9. Cetirizine 10 mg daily.   PHYSICAL EXAMINATION:  GENERAL:  Today, he is in no acute distress.  VITAL SIGNS:  His blood pressure is 118/72, his pulse is 56 and regular,  and his weight is 205.  HEENT:  Unchanged.  Carotids are full without bruits, no JVD.  Thyroid  is not enlarged.  Trachea is midline.  LUNGS:  Clear.  HEART:  Reveals a regular rate and rhythm.  No gallop.  PMI is  nondisplaced.  ABDOMINAL:  Soft.  Good bowel sounds.  EXTREMITIES:  No cyanosis, clubbing, or edema.  Pulses were intact.  NEURO:  Intact.   Electrocardiogram shows sinus bradycardia with left axis deviation.  No  significant change.   Ms. Darling is doing pretty well with very infrequent episodes of atrial  fib.  I am going to repeat his 2D echocardiogram to assess his LV  function and left atrial size.  We will continue with aspirin 325 mg a  day.  I will see him back again in 6 months.     Thomas C. Daleen Squibb, MD, [facility]  Electronically Signed    TCW/MedQ  DD: 10/08/2007  DT: 10/09/2007  Job #: [id number]

## 2010-07-14 ENCOUNTER — Encounter: Payer: Self-pay | Admitting: Pulmonary Disease

## 2010-07-14 DIAGNOSIS — J449 Chronic obstructive pulmonary disease, unspecified: Secondary | ICD-10-CM | POA: Insufficient documentation

## 2010-07-14 DIAGNOSIS — J310 Chronic rhinitis: Secondary | ICD-10-CM | POA: Insufficient documentation

## 2010-07-14 NOTE — Assessment & Plan Note (Signed)
He reports previous sleep study years ago demonstrating sleep apnea.  He has never been on therapy for this.  He has sleep disruption, snoring, and daytime sleepiness.  He has a history of atrial fibrillation.  To further assess this will arrange for in-lab sleep study.

## 2010-07-14 NOTE — Assessment & Plan Note (Signed)
Advised him to use flonase on a scheduled basis for the next several weeks.  He can use zyrtec at night for possible allergic component.  If his symptoms persist, he may need ENT evaluation due to nasal septal deviation.

## 2010-07-14 NOTE — Assessment & Plan Note (Signed)
He is followed by Dr. Russella Dar, and is to continue with prevacid.

## 2010-07-14 NOTE — Assessment & Plan Note (Signed)
I have advised him to use Qvar on a scheduled basis to get maximal benefit.  I will give him a one week course of singulair.  There is concern that his recent episode of A. Fib may have been related to albuterol use, and will therefore hold off on resuming this for now.

## 2010-07-14 NOTE — Assessment & Plan Note (Signed)
He is followed by Dr. Daleen Squibb.

## 2010-07-15 ENCOUNTER — Telehealth: Payer: Self-pay | Admitting: Pulmonary Disease

## 2010-07-15 NOTE — Telephone Encounter (Signed)
ERROR

## 2010-07-16 NOTE — Assessment & Plan Note (Signed)
[facility]                              CARDIOLOGY OFFICE NOTE   NAME:Sleep, GENIE MIRABAL                       MRN:          [id number]  DATE:10/26/2005                            DOB:          07-03-1940    SUBJECTIVE:  Mr. Bhola is a very pleasant 70 year old male patient, who is  referred to [place] today by Dr. Perrin Maltese.  The patient has a history of  palpitations in the past, and saw Dr. Daleen Squibb back in 2005.  At that time he  was set up for a stress Myoview study.  This showed no scar ischemia and an  EF of 52%.  The patient has no history of cardiac disease.  He went to Dr.  Ernestene Mention office yesterday for a routine followup.  He does occasionally have  palpitations from time to time.  He had an unusual sensation yesterday that  is difficult for him to describe.  This was nothing different from what he  has experienced in the past.  While in the office he noted that the nurses  had a hard  time getting his blood pressure, and he asked to have an EKG.  The EKG showed he was in atrial fibrillation, with RVR at a rate of 114.  There were no ischemic changes.  The patient was placed on Toprol, and asked  to follow up today.  He denies any history of chest pain, shortness of  breath, dyspnea with exertion, orthopnea, paroxysmal nocturnal dyspnea,  syncope or presyncope.   PAST MEDICAL HISTORY:  The patient denies any history of hypertension,  hyperlipidemia, diabetes mellitus, stroke or heart failure.  He does have a  history of benign prostatic hypertrophy.  He has a right renal mass that is  being followed by Dr. Wanda Plump, and had a recent CT scan without change.  He has a history of gastroesophageal reflux disease.  He is status post  abdominal surgery some years ago for removal of an intestinal mass.   CURRENT MEDICATIONS:  1. Vitamin E.  2. Aspirin 81 mg daily.  3. Cardura 4 mg q.h.s.  4. Protonix 40 mg daily.  5. Omega-3 fish oil.  6. Multivitamin.  7. DHEA 10.  8. Supra critical antioxidant.  9. Osteo-Citrate.  10.__________.  11.Optimum C.  12.Metoprolol succinate 50 mg daily.   ALLERGIES:  No known drug allergies.   SOCIAL HISTORY:  Denies any tobacco or alcohol abuse.  He exercises on the  elliptical machine every other day and gets his heart rate up to above 130  without complaints of chest pain or shortness of breath.   FAMILY HISTORY:  Significant for stroke in his mother when she was 33 years  old.  There is also a history of hypertension and diabetes in his family.  No history of premature coronary disease.   REVIEW OF SYSTEMS:  Denies any fevers, chills, melena, hematochezia,  hematuria, other review of systems are negative.   PHYSICAL EXAMINATION:  GENERAL:  He is a well-developed, well-nourished male  in no distress.  VITAL SIGNS: Blood pressure is 110/64, pulse 63,  weight 193 pounds.  HEENT:  Unremarkable.  NECK:  Without JVD.  CARDIAC:  Normal S1, S2, regular rate and rhythm without murmurs.  LUNGS:  Clear to auscultation bilaterally without wheezing, rhonchi or  rales.  ABDOMEN:  Soft, nontender, with normoactive bowel sounds, no organomegaly.  EXTREMITIES:  Without edema, calves are soft, nontender.  ENDOCRINE:  Without thyromegaly.  NEUROLOGIC:  He is alert and oriented x3.  Cranial nerves II-XII are grossly  intact.   Electrocardiogram in our office revealed sinus rhythm with a heart rate of  53, left axis deviation, no acute changes.  Electrocardiogram from Dr.  Ernestene Mention office reveals atrial fibrillation with a heart rate of 114 from  October 25, 2005.   IMPRESSION:  1. Paroxysmal atrial fibrillation.  2. Benign prostatic hypertrophy.  3. History of right renal mass, followed by Dr. Wanda Plump.  4. Gastroesophageal reflux disease.  5. Family history of stroke.   PLAN:  The patient presents today for evaluation of paroxysmal atrial  fibrillation.  He has a lack of embolic risk factors.  He is less  than 71  years of age, has no history of documented hypertension, diabetes mellitus,  stroke or heart failure.  At this point in time, given his low risk for  stroke, we would recommend full strength aspirin 325 mg daily for anti-  thrombosis.  I have also asked the patient to continue on his Toprol as  given to him by Dr. Perrin Maltese.  We will set the patient up for an echocardiogram  as well as a 48-hour Holter monitor to assess how often he is going into a-  fib, and what his rate is.  We will also check a BMET, CBC and TSH.  The  patient requests a stress Myoview study, as it has been a couple of years  since we have done that.  We will also get him set up for that.  I will have  him follow up with Dr. Daleen Squibb in the next couple of  weeks as the above testing is completed.  I have discussed the patient's  case with Dr. Riley Kill, who agreed with the above assessment and plan.                                  Tereso Newcomer, PA-C                           Arturo Morton. Riley Kill, MD, [facility]   SW/MedQ  DD:  10/26/2005 DT:  10/27/2005 Job #:  [id number]   cc:   Jonita Albee, MD

## 2010-07-16 NOTE — Assessment & Plan Note (Signed)
[facility]                         GASTROENTEROLOGY OFFICE NOTE   [name], [name]                       MRN:          [id number]  DATE:06/05/2006                            DOB:          04-11-1940    [name] returns for followup of GERD complicated by Barrett's  esophagus.  He states his reflux symptoms are under excellent control.  Biopsies from his endoscopy in February, 2006, showed no evidence of  dysplasia.  A followup endoscopy was recommended for February, 2009.  His last colonoscopy was performed in August of 2001.  He has no  dysphagia, odynophagia, weight loss, melena, hematochezia, change in  bowel habits or change in stool caliber.   CURRENT MEDICATIONS:  Listed on the chart, updated and reviewed.   MEDICATION ALLERGIES:  None known.   EXAM:  A well-developed, overweight white male in no acute distress.  Height 5 feet 10 inches, weight 201.2 pounds, blood pressure is 132/68,  pulse 60 and regular.  HEENT EXAM:  Anicteric sclera, oropharynx clear.  CHEST:  Clear to auscultation bilaterally.  CARDIAC:  Regular rate and rhythm without murmurs appreciated.  ABDOMEN:  Soft and nontender with normoactive bowel sounds.  No palpable  organomegaly, masses or hernias.   ASSESSMENT AND PLAN:  1. Gastroesophageal reflux disease complicated by Barrett's esophagus.      Maintain Prevacid SoluTab 30mg  one by mouth or sublingually every      morning.  Maintain standard antireflux measures.  Recall endoscopy      for February of 2009.  2. Average risk for colorectal cancer.  Recall colonoscopy recommended      in August, 2011.     Venita Lick. Russella Dar, MD, [facility]  Electronically Signed    MTS/MedQ  DD: 06/05/2006  DT: 06/05/2006  Job #: [id number]   cc:   Jonita Albee, M.D.

## 2010-07-16 NOTE — Assessment & Plan Note (Signed)
[facility]                              CARDIOLOGY OFFICE NOTE   IVAR, DOMANGUE                       MRN:          [id number]  DATE:12/07/2005                            DOB:          [birth year]    Mr. Lundahl returns today for further management of his atrial fibrillation.  He has been doing remarkably well, continues with his regular exercise  program.  He is concerned that he is not getting his heart rate to 130 to  keep his arteries clean.  He was told this at a health center.   He is taking Metoprolol 50 mg once a day.  His resting heart rate today is  49.  He says it is usually around 60 when he checks it.  He has had no other  symptoms or any suggestion of atrial fibrillation.  He remains on aspirin  325 a day.   His blood pressure today is 130/66, pulse 49 in sinus brady.  EKG confirms.  Weight 195.  Carotid upstrokes equal bilaterally without bruits, no JVD,  thyroid is not enlarged, trachea is midline.  Lungs are clear.  Heart  reveals a slow rate and rhythm.  Abdomen exam is soft.  Extremities show no  edema.  Pulses are intact.   ASSESSMENT/PLAN:  Mr. Stephens is doing well with his atrial fibrillation.  We  have asked him to continue his current program.  We will see him back in six  months.  I have calculated his 75% to 85% predicted heart rate as a  guideline.  I have asked him to exercise to a level of about 115 to 120.       Thomas C. Daleen Squibb, MD, [facility]     TCW/MedQ  DD:  12/07/2005  DT:  12/08/2005  Job #:  [id number]   cc:   Jonita Albee, M.D.

## 2010-07-16 NOTE — Assessment & Plan Note (Signed)
[facility]                              CARDIOLOGY OFFICE NOTE   NATHANEAL, SOMMERS                       MRN:          [id number]  DATE:11/10/2005                            DOB:          [birth year]    ADDENDUM   I have now received his blood work, which was normal, including a CBC,  chemistry and TSH.  He also already had an echocardiogram which was normal.  We will see him back in four weeks for followup.                               Thomas C. Daleen Squibb, MD, [facility]    TCW/MedQ  DD:  11/10/2005  DT:  11/11/2005  Job #:  [id number]   cc:   Jonita Albee, M.D.

## 2010-07-16 NOTE — Assessment & Plan Note (Signed)
[facility]                              CARDIOLOGY OFFICE NOTE   [name], [name]                       MRN:          [id number]  DATE:11/10/2005                            DOB:          [birth year]    Mr. [name] comes in today for further management of his paroxysmal atrial  fibrillation.  This was discovered by accident on a office visit to Dr.  Perrin Maltese.  He has been totally asymptomatic with this.   He saw Tereso Newcomer in our office on the 29th of August.  Blood work was  ordered and also stress Myoview.   He was placed on Metoprolol 50 mg once a day by Dr. Perrin Maltese and aspirin 325 mg  a day.   He has no significant risk factors of atrial fibrillation other than  advancing age.  He is 70 year old.   EXAMINATION:  VITAL SIGNS:  His exam today shows a blood pressure of 122/70.  His pulse is 62 and regular.  LUNGS:  His lungs are clear.  NECK:  Carotid upstrokes are equal without bruits and no JVD. Thyroid is not  enlarged.  HEART:  Regular.  There is no murmur.  EXTREMITIES:  No edema.  Pulses are intact.   His exercise stress Myoview showed excellent exercise tolerance.  He  exercised for 10-minutes with a MET level 11.7.  Blood pressure increased  with appropriate exercise. He was in sinus rhythm and stayed in sinus  tachycardia during his stress test.  There was no atrial fibrillation.   His perfusion scan was normal with no scars.  EF  was 49%.   He also had a 48-hour Holter monitor that was taken off today.  A scan of  this shows only some PACs and PVCs of the preliminary report but no atrial  fibrillation.   His blood work we are trying to retrieve at present.   ASSESSMENT:  1. Etiopathic process atrial fibrillation which is fairly asymptomatic.  2. Normal stress Myoview with excellent exercise tolerance.   PLAN:  1. Continue Metoprolol 50 mg once a day.  2. Continue aspirin 325 a day.  3. Arrange 2-D echocardiogram to rule out  any structural heart disease.  4. Check blood work that is pending.  5. He was advised to follow up with [place] again in about 4-weeks.                               Thomas C. Daleen Squibb, MD, [facility]    TCW/MedQ  DD:  11/10/2005  DT:  11/11/2005  Job #:  [id number]   cc:   Jonita Albee, M.D.

## 2010-07-16 NOTE — Op Note (Signed)
[place]. [facility]  Patient:    Foster Foster                       MRN: [id number] Proc. Date: 01/12/00 Adm. Date:  [id number] Attending:  Brandy Hale CC:         Venita Lick. Pleas Koch., M.D. [facility]  Dr. Robert Bellow   Operative Report  PREOPERATIVE DIAGNOSIS: 1. Small bowel tumor. 2. Incarcerated umbilical hernia.  POSTOPERATIVE DIAGNOSIS: 1. Small bowel tumor. 2. Incarcerated umbilical hernia.  OPERATION PERFORMED:  Exploratory laparotomy, segmental small bowel resection, repair of incarcerated umbilical hernia.  SURGEON:  Angelia Mould. Derrell Lolling, M.D.  ASSISTANT:  Lorne Skeens. Hoxworth, M.D.  ANESTHESIA:  INDICATIONS FOR PROCEDURE:  The patient is a 70 year old white male who has a history of antiobiotic induced colitis in 1996.  He recovered from that.  A colonoscopy in 1996 was performed consistent with colitis.  He has been well since that time.  He underwent screening virtual colonoscopy recently and that showed some thickening in the sigmoid colon and a 2.0 cm soft tissue mass in the proximal ileum of the small bowel.  The colonoscopy was then performed which showed a normal colon.  A small bowel follow-through showed a 2 cm diameter polypoid lesion in the mid to distal one third of the ileum.  It appeared to be intraluminal and possibly mobile on a stalk.  The patient was otherwise asymptomatic.  I discussed the differential diagnosis with the patient including benign and malignant etiologies as well as the risk of intussusception from this tumor.  After considering his options for about six weeks, the patient decided to have elective surgery.  He also has an incarcerated umbilical hernia which was quite large and he wanted to have that repaired at the same time.  He was brought to the operating room electively.  OPERATIVE FINDINGS:  The patient had an incarcerated umbilical hernia with a defect about 2.5 to 3 cm in diameter.  The  hernia contained incarcerated fat. In the mid to proximal ileum, there was a 2 cm diameter fatty mass which was visible easily through the wall of the ileum.  This did not appear very mobile, did not appear to be pedunculated.  Once this was removed, it appeared to be part of the wall of the ileum and when Dr. Guilford Shi opened this up, he felt that this was benign but stated that it also contained some milky fluid suggesting at least partly cystic component.  There were no other abnormal findings.  The entire small bowel was examined twice.  We found no other lesions.  The colon felt normal.  The liver, gallbladder, stomach and duodenum felt normal.  The peritoneal surfaces felt normal.  DESCRIPTION OF PROCEDURE:  Following the induction of general endotracheal anesthesia, a Foley catheter was inserted and the patients abdomen was prepped and draped in a sterile fashion.  The midline incision was made beginning just above the umbilicus and extending down below the umbilicus for about 5 or 6 cm.  Dissection was carried down through subcutaneous tissue.  We dissected out the incarcerated umbilical hernia sac, debrided all of the fact from the hernia sac, trimmed the fascia back.  We undermined the fascia so as to prepare for closure later.  The abdomen was explored with the findings as described above.  We felt that there was only a single lesion of the small bowel.  We isolated the small bowel and  divided it about 3 cm proximal and about 3 cm distal to the palpable mass, using the GIA stapling device.  Mesenteric vessels were isolated, clamped, divided and ligated with 2-0 silk ties.  The specimen was sent to pathology with gross findings by Dr. Guilford Shi as described above.  The anastomosis was created between the proximal and distal ends of the small bowel using a GIA stapling device and closure of the defect in the bowel wall was performed using a TA-60 stapling device.  This provided a very  secure closure.  We had a little bleeding from the staple line and these were oversewn with 3-0 silk ties.  We placed some extra sutures at strategic points along the staple line.  We then changed our gloves and instruments.  The abdomen was irrigated with about 2L of saline.  The mesentery was closed with interrupted sutures of 3-0 silk.  The small bowel and omentum were returned to their anatomic positions. The midline fascia was closed with a running suture of #1 PDS and several interrupted sutures of 0 Prolene were placed above and below the umbilicus to reinforce the repair in that area.  The wound was irrigated with saline and the skin closed with skin staples.  Clean bandages were placed and the patient taken to the recovery room in stable condition.  Estimated blood loss was about 150 cc.  Complications were none.  Sponge, needle and instrument counts were correct. DD:  01/12/00 TD:  01/12/00 Job: [id number]

## 2010-07-16 NOTE — Discharge Summary (Signed)
[facility]  Patient:    [name], [name]                         MRN: [id number] Adm. Date:  01/12/00 Disc. Date: 01/18/00 Attending:  Angelia Mould. Derrell Lolling, M.D. CC:         Robert Bellow, M.D.  Venita Lick. Pleas Koch., M.D. [facility]   Discharge Summary  FINAL DIAGNOSES: 1. Small bowel tumor, benign hemangioma. 2. Umbilical hernia.  OPERATION PERFORMED:  Exploratory laparotomy, segmental small bowel resection, umbilical herniorrhaphy.  HISTORY OF PRESENT ILLNESS:  This is a 70 year old white man who is in good health. She has had clostridium difficile colitis in the past. He has had routine screening colonoscopy over the years. He had a virtual colonoscopy for screening in June of 2001 at [facility] and was thought to have some thickening in the sigmoid colon and a 2.0 cm soft tissue mass in the proximal ileum of his small bowel. A colonoscopy was then performed which was normal. A small bowel series showed a polypoid lesion noted in the ileum about 1.5 cm in diameter, thought to be intraluminal. Considerations from a radiographic standpoint included lipoma, leiomyoma, leiomyosarcoma, adenomatous polyp, carcinoid tumor, ______ or adenocarcinoma. Benign etiology was suggested by the smooth nature of the lesion but there was great concern by the patient and his physicians that this might be a malignant process and might lead to an interception or obstruction. He was brought to the hospital electively for resection.  PHYSICAL EXAMINATION:  GENERAL:  Healthy, middle age gentleman in no distress.  HEENT:  Sclera clear.  NECK:  Supple, nontender, no mass.  LUNGS:  Clear to auscultation.  HEART:  Regular rate and rhythm with no murmur.  ABDOMEN:  Soft, nontender, no mass. He did have an umbilical hernia which was partially reducible.  HOSPITAL COURSE:  On the day of admission, the patient was brought to the operating room and underwent exploratory  laparotomy. The only abnormal finding in the abdomen was a soft palpable tumor within the lumen of the mid ileum. Short segmental resection was performed. Final pathology report showed a benign hemangioma. Umbilical hernia was repaired at the same time.  Postoperatively, the patient did well. He had an ileus for a few days and he also had some problems with atelectasis early on but this was short lived. He progressed in his diet and activities rather rapidly. Final pathology report was discussed with him. He was discharged on January 18, 2000. At that time, he was tolerating a diet and had bowel movements. His abdomen was soft and benign and the wounds were healing uneventfully. His staples were removed and his Steri-Strips were applied. He was discharged and asked to follow-up with me in 2 weeks. DD:  02/07/00 TD:  02/07/00 Job: [id number]

## 2010-07-20 ENCOUNTER — Encounter: Payer: Self-pay | Admitting: Pulmonary Disease

## 2010-07-20 ENCOUNTER — Encounter: Payer: Self-pay | Admitting: Gastroenterology

## 2010-07-21 ENCOUNTER — Encounter: Payer: Self-pay | Admitting: Cardiology

## 2010-07-21 ENCOUNTER — Ambulatory Visit (INDEPENDENT_AMBULATORY_CARE_PROVIDER_SITE_OTHER): Payer: Medicare PPO | Admitting: Cardiology

## 2010-07-21 VITALS — BP 118/64 | HR 52 | Resp 18 | Ht 70.0 in | Wt 197.0 lb

## 2010-07-21 DIAGNOSIS — I4891 Unspecified atrial fibrillation: Secondary | ICD-10-CM

## 2010-07-21 MED ORDER — METOPROLOL SUCCINATE ER 50 MG PO TB24: ORAL_TABLET | ORAL | Status: DC

## 2010-07-21 NOTE — Patient Instructions (Addendum)
Your physician has requested that you have an echocardiogram. Echocardiography is a painless test that uses sound waves to create images of your heart. It provides your doctor with information about the size and shape of your heart and how well your heart's chambers and valves are working. This procedure takes approximately one hour. There are no restrictions for this procedure.  Your physician recommends that you schedule a follow-up appointment in: 6 months with Dr. Daleen Squibb Your physician has recommended you make the following change in your medication: stop aspirin

## 2010-07-21 NOTE — Assessment & Plan Note (Signed)
Sleep apnea could be lowering his threshold. I have reinforced need for study. Will discontinue his ASA. Will repeat Echo to evaluate LA size and LV function. Last study in 2009.

## 2010-07-21 NOTE — H&P (Signed)
[name], [name]:  [id number]  MEDICAL RECORD NO.:  [id number]           PATIENT TYPE:  I  LOCATION:  2012                         FACILITY:  [facility]  PHYSICIAN:  Harlon Flor, MD   DATE OF BIRTH:  1940-04-13  DATE OF ADMISSION:  05/27/2010 DATE OF DISCHARGE:                             HISTORY & PHYSICAL   CARDIOLOGIST:  Maisie Fus C. Wall, MD, [facility]  ADMISSION DIAGNOSIS:  Arial fibrillation.  CHIEF COMPLAINT:  Palpitations.  HISTORY OF PRESENT ILLNESS:  Mr. [name] is a pleasant 70 year old white male with paroxysmal AFib who [facility] after being found to be in AFib that he reports has been present for at least 36 hours.  He has recently had some cough and congestion, has been treated with inhalers including albuterol.  It is noted over the last 1-2 weeks he has had increased episodes of tachy palpitations. Most of the time these episodes last only 45 hours and then resolve. However, yesterday morning he began having palpitations and it persisted all through the day yesterday and the day today.  He states when he walks around or stands up, his heart rate can get up into the 140s, but overall he has been fairly rate controlled.  He is not currently on anticoagulation by his choice but has been considering it and is willing to try Pradaxa.  Until recently, these episodes have been fairly well- controlled on Toprol-XL 50 mg in the morning and 25 mg at night.  PAST MEDICAL HISTORY: 1. Paroxysmal atrial fibrillation:  Echocardiogram in 2009 with normal     LV function.  Left atrial size was normal. 2. BPH. 3. History of Barrett esophagus. 4. Hiatal hernia. 5. Right renal mass. 6. Small-bowel resection of a hemangioma, as well as a repair of     incarcerated umbilical hernia in November 2001. 7. Renal mass status post radiofrequency fibrillation.  HOME MEDICATIONS: 1. Prevacid 30 mg daily. 2. Metoprolol 50 mg in  the morning and 25 mg at night. 3. Aspirin 325 daily. 4. Doxazosin 2 mg at bedtime. 5. Avodart 0.5 mg daily. 6. Multivitamin 1 tablet daily.  ALLERGIES:  No known drug allergies.  SOCIAL HISTORY:  The patient has never used tobacco and does not drink alcohol.  He exercises regularly at the gym.  FAMILY HISTORY:  His mother died at age 46 with a stroke.  There is no early coronary disease.  REVIEW OF SYSTEMS:  Four-review of systems obtained is negative except as stated in the HPI.  PHYSICAL EXAMINATION:  VITAL SIGNS:  Blood pressure 100/71, pulse 88, respirations 17, temperature 98.1. GENERAL:  No acute distress. HEENT:  Extraocular movements intact.  Oropharynx benign.  Nonicteric sclera. NECK:  Supple. CARDIOVASCULAR:  Irregular rhythm.  No murmurs or rubs or gallops. Jugular venous pressure is normal. LUNGS:  Clear to auscultation bilaterally. ABDOMEN:  Soft, nontender, nondistended. EXTREMITIES:  No clubbing, cyanosis or edema.  Pulses 2+ dorsalis pedis, posterior tibial, and femoral bilaterally. NEURO:  Grossly afocal.  Moves all extremities.  Alert and oriented x3. SKIN:  No rashes. LYMPH:  No lymphadenopathy.  LABORATORY DATA:  Pending.  EKG shows atrial fibrillation with a left axis deviation.  ASSESSMENT/PLAN:  Mr. Gautier is a 70 year old white male with paroxysmal atrial fibrillation who has had increase in his episodes of atrial fibrillation last week and has been in atrial fibrillation persistently for the last 36-48 hours and is symptomatic with palpitations.  His episodes of atrial fibrillation may have been exacerbated by recent use of albuterol inhalers for a cough. 1. Paroxysmal atrial fibrillation:  He is currently rate controlled     but has had atrial fibrillation relief last week.  He is not     currently on anticoagulation and would like to consider Pradaxa.  I     will start him on Lovenox 1mg /kg every 12 hours subcutaneously     and plan for a  TEE-guided cardioversion in the morning.  For now, I     will continue his current dose of Toprol.  Pradaxa to be considered     at discharge for systemic anticoagulation.  He will remain n.p.o.     after midnight. 2. Cough:  Recently with cough secondary to postnasal drip, has been     on albuterol inhalers which may have been exacerbated due to his     atrial fibrillation.  We will hold his albuterol for now.  Please note, he is on two inhalers one of which is albuterol, other which he does not have with him, is unsure what it is, these will need to be reconciled once his family members obtain his medicines.     Harlon Flor, MD     MMB/MEDQ  D:  05/27/2010  T:  05/28/2010  Job:  [id number]  cc:   Thomas C. Daleen Squibb, MD, [facility]  Electronically Signed by Meridee Score MD on 07/21/2010 07:18:19 PM

## 2010-07-21 NOTE — Progress Notes (Signed)
   Patient ID: [name], male    DOB: [birth year], 70 y.o.   MRN: [id number]  HPI  [name] returns post hospital for recurrent PAF. He was placed on Pradaxa and spontaneously converted. He has a few brief episodes since. Dr Perrin Maltese is concerned he has sleep apnea and has referred to Dr Craige Cotta for a sleep study. He is still taking ASA and has significant GERD issuee. Will stop today.  His EKG today shows Sinus bradycardia with left axis deviation.    Review of Systems  All other systems reviewed and are negative.      Physical Exam  Nursing note and vitals reviewed. Constitutional: He is oriented to person, place, and time. He appears well-developed and well-nourished.  HENT:  Head: Normocephalic and atraumatic.  Eyes: EOM are normal. Pupils are equal, round, and reactive to light.  Neck: Neck supple. No JVD present. No tracheal deviation present. No thyromegaly present.  Cardiovascular: Normal rate, regular rhythm, normal heart sounds and intact distal pulses.   No murmur heard. Pulmonary/Chest: Effort normal and breath sounds normal.  Abdominal: Soft. Bowel sounds are normal.  Musculoskeletal: Normal range of motion. He exhibits no edema.  Neurological: He is alert and oriented to person, place, and time.  Skin: Skin is warm and dry.  Psychiatric: He has a normal mood and affect.

## 2010-08-02 ENCOUNTER — Ambulatory Visit (HOSPITAL_COMMUNITY): Payer: Medicare PPO | Attending: Internal Medicine

## 2010-08-02 DIAGNOSIS — I4891 Unspecified atrial fibrillation: Secondary | ICD-10-CM | POA: Insufficient documentation

## 2010-08-02 DIAGNOSIS — I079 Rheumatic tricuspid valve disease, unspecified: Secondary | ICD-10-CM | POA: Insufficient documentation

## 2010-08-02 DIAGNOSIS — I379 Nonrheumatic pulmonary valve disorder, unspecified: Secondary | ICD-10-CM | POA: Insufficient documentation

## 2010-08-02 DIAGNOSIS — I059 Rheumatic mitral valve disease, unspecified: Secondary | ICD-10-CM | POA: Insufficient documentation

## 2010-08-05 ENCOUNTER — Encounter (HOSPITAL_BASED_OUTPATIENT_CLINIC_OR_DEPARTMENT_OTHER): Payer: Medicare PPO

## 2010-08-05 ENCOUNTER — Ambulatory Visit (HOSPITAL_BASED_OUTPATIENT_CLINIC_OR_DEPARTMENT_OTHER): Payer: Medicare PPO | Attending: Pulmonary Disease

## 2010-08-05 ENCOUNTER — Other Ambulatory Visit: Payer: Self-pay | Admitting: *Deleted

## 2010-08-05 DIAGNOSIS — Z79899 Other long term (current) drug therapy: Secondary | ICD-10-CM | POA: Insufficient documentation

## 2010-08-05 DIAGNOSIS — G4710 Hypersomnia, unspecified: Secondary | ICD-10-CM

## 2010-08-05 DIAGNOSIS — G4733 Obstructive sleep apnea (adult) (pediatric): Secondary | ICD-10-CM | POA: Insufficient documentation

## 2010-08-05 DIAGNOSIS — I4891 Unspecified atrial fibrillation: Secondary | ICD-10-CM | POA: Insufficient documentation

## 2010-08-05 MED ORDER — METOPROLOL SUCCINATE ER 50 MG PO TB24: ORAL_TABLET | ORAL | Status: DC

## 2010-08-06 ENCOUNTER — Telehealth: Payer: Self-pay | Admitting: Pulmonary Disease

## 2010-08-06 NOTE — Telephone Encounter (Signed)
He needs to keep his appt.  He has other issues besides his sleep apnea that I am addressing.  Will review his sleep study and discuss at next visit.

## 2010-08-06 NOTE — Telephone Encounter (Signed)
Spoke with pt.  He states that he had "sleep test" done this am and was advised by tech that he would need to come back for another test "with a mask". I can not tell by records in Epic what test he is referring to. He states has rov with VS scheduled for 08/12/10 and wants to know if he should cancel this appt until further testing is complete. Please advise thanks!

## 2010-08-06 NOTE — Telephone Encounter (Signed)
Called spoke with patient, advised of VS's recs.  Pt okay with this and verbalized his understanding.  Pt will keep the 6.14.12 appt @ 1630 with VS.

## 2010-08-08 ENCOUNTER — Encounter: Payer: Self-pay | Admitting: Pulmonary Disease

## 2010-08-08 DIAGNOSIS — I4891 Unspecified atrial fibrillation: Secondary | ICD-10-CM

## 2010-08-08 DIAGNOSIS — G4733 Obstructive sleep apnea (adult) (pediatric): Secondary | ICD-10-CM

## 2010-08-08 DIAGNOSIS — Z9989 Dependence on other enabling machines and devices: Secondary | ICD-10-CM | POA: Insufficient documentation

## 2010-08-08 NOTE — Procedures (Addendum)
[name], PICKERILL NO.:  [id number]  MEDICAL RECORD NO.:  [id number]          PATIENT TYPE:  OUT  LOCATION:  SLEEP CENTER                 FACILITY:  [facility]  PHYSICIAN:  Coralyn Helling, MD        DATE OF BIRTH:  [birth year]  DATE OF STUDY:  08/05/2010                           NOCTURNAL POLYSOMNOGRAM  REFERRING PHYSICIAN:  Chany Woolworth  INDICATIONS:  [name] is a 70 year old male who has a history of paroxysmal atrial fibrillation.  He also has a history of snoring, sleep disruption, and daytime sleepiness.  He is therefore referred to Sleep Lab for evaluation of hypersomnia with obstructive sleep apnea.  Height is 70 inches, weight is 195 pounds.  BMI is 28.  Neck size is 14 inches.  MEDICATIONS:  Flonase, cetirizine, benzonatate, QVAR, Pradaxa, doxazosin, fluticasone, Lortab, lansoprazole, metoprolol, multivitamin, zolpidem, and Singulair.  EPWORTH SCORE:  7..  SLEEP ARCHITECTURE:  Total recording time was 362 minutes.  Total sleep time was 250 minutes.  Sleep efficiency was 69%.  Sleep latency was 23 minutes.  REM latency was 146 minutes.  The study was notable for lack of stage III sleep.  The patient slept predominately in the supine position.  RESPIRATORY DATA:  The average ratio was 18.  Loud snoring was noted by the technician.  The overall apnea/hypopnea index was 24.2.  There were 10 central apneic events.  The remainder of the events were obstructive in nature.  The REM apnea/hypopnea index was 25.1.  The non-REM apnea/hypopnea index was 24.1.  The supine apnea/hypopnea index was 16.8.  The non-supine apnea/hypopnea index was 16.7.  OXYGEN DATA:  The baseline oxygenation was 96%.  The oxygen saturation nadir was 77%.  The study was conducted without the use of supplemental oxygen.  CARDIAC DATA:  The average heart rate was 66 and rhythm strip showed sinus rhythm with occasional PVCs.  MOVEMENT PARASOMNIA:  The periodic limb movement index was  15.8.  The patient had 6 restroom trips.  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  This was associated with significant oxygen desaturations.  In addition to diet, excise, weight reduction, I would recommend that the patient be returned to the Sleep Lab for a CPAP titration study.     Coralyn Helling, MD Diplomat, American Board of Sleep Medicine Electronically Signed    VS/MEDQ  D:  08/08/2010 09:35:49  T:  08/08/2010 21:01:59  Job:  [id number]

## 2010-08-09 ENCOUNTER — Encounter: Payer: Self-pay | Admitting: Pulmonary Disease

## 2010-08-12 ENCOUNTER — Encounter: Payer: Self-pay | Admitting: Pulmonary Disease

## 2010-08-12 ENCOUNTER — Ambulatory Visit (INDEPENDENT_AMBULATORY_CARE_PROVIDER_SITE_OTHER): Payer: Medicare PPO | Admitting: Pulmonary Disease

## 2010-08-12 DIAGNOSIS — J449 Chronic obstructive pulmonary disease, unspecified: Secondary | ICD-10-CM

## 2010-08-12 DIAGNOSIS — R05 Cough: Secondary | ICD-10-CM

## 2010-08-12 DIAGNOSIS — J310 Chronic rhinitis: Secondary | ICD-10-CM

## 2010-08-12 DIAGNOSIS — R059 Cough, unspecified: Secondary | ICD-10-CM

## 2010-08-12 DIAGNOSIS — J4489 Other specified chronic obstructive pulmonary disease: Secondary | ICD-10-CM

## 2010-08-12 DIAGNOSIS — G4733 Obstructive sleep apnea (adult) (pediatric): Secondary | ICD-10-CM

## 2010-08-12 MED ORDER — CETIRIZINE HCL 10 MG PO TABS: 10.0000 mg | ORAL_TABLET | Freq: Every day | ORAL | Status: DC | PRN

## 2010-08-12 NOTE — Assessment & Plan Note (Signed)
He is to continue Qvar.  Advised he could try to gradual reduce his inhaler use as tolerated.

## 2010-08-12 NOTE — Patient Instructions (Signed)
Check with your dentist about an oral appliance to treat sleep apnea; call if you need a referral to a dentist for this Follow up in 3 to 4 months

## 2010-08-12 NOTE — Progress Notes (Signed)
Subjective:    Patient ID: [name], male    DOB: 1940/04/10, 70 y.o.   MRN: [id number]  HPI 70 yo male with chronic cough with chronic obstructive asthma, rhinits, GERD.  Also with moderate sleep apnea.  He is here to review his sleep test>>PSG 08/05/10>>AHI 24.2, SpO2 low 77%, PLMI 15.8.  His cough has improved quite a bit.  He still has occasional cough.  He will bring up phlegm once per day.  He feels Qvar has helped.  He is not using singulair anymore.  His sinuses are okay.  He is not having much trouble with his stomach.   Review of Systems     Objective:   Physical Exam BP 118/70  Pulse 60  Temp(Src) 98.5 F (36.9 C) (Oral)  Ht 5\' 10"  (1.778 m)  Wt 202 lb 3.2 oz (91.717 kg)  BMI 29.01 kg/m2  SpO2 95%     General - Thin, healthy  HEENT - Wears glasses, PERRLA, EOMI, Nasal septal deviation, clear sinus drainage, MP 2, decreased AP diameter of oropharynx, no LAN, no thyromegaly  Cardiac - s1s2 regular, no murmur, pulses symmetric  Chest - CTA  Abd - soft, thin, non-tender, normal bowel sounds  Ext - no e/c/c  Neuro - normal strength, CN intact, A&O x 3  Psych - normal mood/behavior  Skin - no rashes  Assessment & Plan:   OSA (obstructive sleep apnea) He has moderate sleep apnea.  I have reviewed his sleep test results with the patient.  Explained how sleep apnea can affect the patient's health.  Driving precautions and importance of weight loss were discussed.  Treatment options for sleep apnea were reviewed.  He would like to try an oral appliance.  He will check with his dentist first.  He will call if he needs to get a referral.  Advised he will need follow up sleep testing after oral appliance is fitted.  Cough Improved.  He can use benzonatate as needed.    Chronic obstructive asthma He is to continue Qvar.  Advised he could try to gradual reduce his inhaler use as tolerated.  Rhinitis He can use zyrtec as needed.    Updated Medication  List Outpatient Encounter Prescriptions as of 08/12/2010  Medication Sig Dispense Refill  . beclomethasone (QVAR) 80 MCG/ACT inhaler Inhale 2 puffs into the lungs 2 (two) times daily.        . benzonatate (TESSALON) 200 MG capsule Take 1 capsule (200 mg total) by mouth 3 (three) times daily as needed for cough.  30 capsule  1  . cetirizine (ZYRTEC) 10 MG tablet Take 1 tablet (10 mg total) by mouth daily as needed for allergies.      Marland Kitchen dabigatran (PRADAXA) 150 MG CAPS Take 150 mg by mouth every 12 (twelve) hours.        Marland Kitchen doxazosin (CARDURA) 4 MG tablet Take 2 mg by mouth at bedtime.       . dutasteride (AVODART) 0.5 MG capsule Take 0.5 mg by mouth daily.        . fluticasone (FLONASE) 50 MCG/ACT nasal spray 2 sprays by Nasal route. 1 spray daily as needed       . HYDROcodone-acetaminophen (LORTAB) 7.5-500 MG per tablet As needed       . metoprolol (TOPROL-XL) 50 MG 24 hr tablet 2 tablets every morning and 1/ tablet in the evening  90 tablet  11  . Multiple Vitamin (MULTIVITAMIN) capsule Take 1 capsule by mouth daily.        Marland Kitchen  omeprazole (PRILOSEC) 20 MG capsule Take 20 mg by mouth daily.        Marland Kitchen zolpidem (AMBIEN) 10 MG tablet Take 10 mg by mouth at bedtime as needed. Once a week as needed       . DISCONTD: cetirizine (ZYRTEC) 10 MG tablet Take 10 mg by mouth daily.        Marland Kitchen DISCONTD: montelukast (SINGULAIR) 10 MG tablet One pill daily at night for one week, then as needed  30 tablet  2

## 2010-08-12 NOTE — Assessment & Plan Note (Signed)
He can use zyrtec as needed.

## 2010-08-12 NOTE — Assessment & Plan Note (Signed)
Improved.  He can use benzonatate as needed.

## 2010-08-12 NOTE — Assessment & Plan Note (Signed)
He has moderate sleep apnea.  I have reviewed his sleep test results with the patient.  Explained how sleep apnea can affect the patient's health.  Driving precautions and importance of weight loss were discussed.  Treatment options for sleep apnea were reviewed.  He would like to try an oral appliance.  He will check with his dentist first.  He will call if he needs to get a referral.  Advised he will need follow up sleep testing after oral appliance is fitted.

## 2010-08-20 ENCOUNTER — Other Ambulatory Visit: Payer: Self-pay | Admitting: Pulmonary Disease

## 2010-08-31 ENCOUNTER — Telehealth: Payer: Self-pay | Admitting: Pulmonary Disease

## 2010-08-31 DIAGNOSIS — G4733 Obstructive sleep apnea (adult) (pediatric): Secondary | ICD-10-CM

## 2010-08-31 NOTE — Telephone Encounter (Signed)
Order sent to [facility] 

## 2010-09-20 ENCOUNTER — Encounter: Payer: Self-pay | Admitting: Cardiology

## 2010-10-13 ENCOUNTER — Ambulatory Visit (INDEPENDENT_AMBULATORY_CARE_PROVIDER_SITE_OTHER): Payer: Medicare PPO | Admitting: Cardiology

## 2010-10-13 ENCOUNTER — Encounter: Payer: Self-pay | Admitting: Cardiology

## 2010-10-13 VITALS — BP 134/78 | HR 56 | Resp 12 | Ht 70.0 in | Wt 197.0 lb

## 2010-10-13 DIAGNOSIS — I4891 Unspecified atrial fibrillation: Secondary | ICD-10-CM

## 2010-10-13 NOTE — Progress Notes (Signed)
HPI [name] returns for evaluation and management of his paroxysmal A. Fib. He said a couple of short episodes.  He denies any chest pain, shortness breath, frequent palpitations, or edema. He was recently diagnosed with obstructive sleep apnea and is in the process of having treatment strategies addressed.  EKG today shows sinus bradycardia. Past Medical History  Diagnosis Date  . Atrial fibrillation   . Barrett's esophagus   . Paroxysmal atrial fibrillation     Hospitalized on 05/27/10  . GERD (gastroesophageal reflux disease)   . BPH (benign prostatic hyperplasia)   . Barrett esophagus   . Hiatal hernia   . Renal cell carcinoma 2009    right renal mass, followed by Dr. Teodora Medici  . OSA (obstructive sleep apnea) 08/05/10 PSG    AHI 24  . Chronic obstructive asthma     Past Surgical History  Procedure Date  . Renal cancer surgery rf ablation   . Exploratory laparotomy, segmental small bowel resection of a hemangioma, repair of incarcerated umbelical hernia 12/1999  . Exploratory laparotomy w/ bowel resection 12/1999    segmental sbr of a hemangioma, repair of incarcerated umbilical hernia  . Renal cancer surgery with rf ablation   . Tonsillectomy   . Basal cell carcinoma excision     Family History  Problem Relation Age of Onset  . Stroke Mother   . Obesity Brother   . Asthma Paternal Grandmother   . Heart attack Paternal Grandfather   . Kidney cancer Paternal Aunt     History   Social History  . Marital Status: Single    Spouse Name: N/A    Number of Children: N/A  . Years of Education: N/A   Occupational History  . tv producer/construction   . televison Photographer    Social History Main Topics  . Smoking status: Former Smoker -- 10 years    Types: Cigars  . Smokeless tobacco: Never Used   Comment: quit when he was 70 years old  . Alcohol Use: No  . Drug Use: No  . Sexually Active: Not on file   Other Topics Concern  . Not on file   Social History  Narrative   ** Merged History Encounter **     Allergies  Allergen Reactions  . Proair Hfa ([id number])     Increased heart rate    Current Outpatient Prescriptions  Medication Sig Dispense Refill  . beclomethasone (QVAR) 80 MCG/ACT inhaler Inhale 2 puffs into the lungs 2 (two) times daily.        . benzonatate (TESSALON) 200 MG capsule TAKE 1 CAPSULE BY MOUTH THREE TIMES DAILY AS NEEDED FOR COUGH  30 capsule  5  . cetirizine (ZYRTEC) 10 MG tablet Take 1 tablet (10 mg total) by mouth daily as needed for allergies.      Marland Kitchen dabigatran (PRADAXA) 150 MG CAPS Take 150 mg by mouth every 12 (twelve) hours.        Marland Kitchen doxazosin (CARDURA) 4 MG tablet Take 2 mg by mouth at bedtime.       . dutasteride (AVODART) 0.5 MG capsule Take 0.5 mg by mouth daily.        . fluticasone (FLONASE) 50 MCG/ACT nasal spray 2 sprays by Nasal route. 1 spray daily as needed       . HYDROcodone-acetaminophen (LORTAB) 7.5-500 MG per tablet As needed       . lansoprazole (PREVACID) 30 MG capsule Take 30 mg by mouth daily.        Marland Kitchen  metoprolol (TOPROL-XL) 50 MG 24 hr tablet 2 tablets every morning and 1/ tablet in the evening  90 tablet  11  . Multiple Vitamin (MULTIVITAMIN) capsule Take 1 capsule by mouth daily.        Marland Kitchen zolpidem (AMBIEN) 10 MG tablet Take 10 mg by mouth at bedtime as needed. Once a week as needed         ROS Negative other than HPI.   PE General Appearance: well developed, well nourished in no acute distress HEENT: symmetrical face, PERRLA, good dentition  Neck: no JVD, thyromegaly, or adenopathy, trachea midline Chest: symmetric without deformity Cardiac: PMI non-displaced, RRR, normal S1, S2, no gallop or murmur Lung: clear to ausculation and percussion Vascular: all pulses full without bruits  Abdominal: nondistended, nontender, good bowel sounds, no HSM, no bruits Extremities: no cyanosis, clubbing or edema, no sign of DVT, no varicosities  Skin: normal color, no rashes Neuro: alert and  oriented x 3, non-focal Pysch: normal affect Filed Vitals:   10/13/10 1215  BP: 134/78  Pulse: 56  Resp: 12  Height: 5\' 10"  (1.778 m)  Weight: 197 lb (89.359 kg)    EKG  Labs and Studies Reviewed.   Lab Results  Component Value Date   WBC 8.0 05/27/2010   HGB 15.0 05/27/2010   HCT 42.1 05/27/2010   MCV 85.2 05/27/2010   PLT 212 05/27/2010      Chemistry      Component Value Date/Time   NA 135 05/27/2010 2320   K 4.5 05/27/2010 2320   CL 102 05/27/2010 2320   CO2 27 05/27/2010 2320   BUN 23 05/27/2010 2320   CREATININE 1.40 05/27/2010 2320      Component Value Date/Time   CALCIUM 8.5 05/27/2010 2320   ALKPHOS 54 05/27/2010 2320   AST 32 05/27/2010 2320   ALT 18 05/27/2010 2320   BILITOT 0.9 05/27/2010 2320       No results found for this basename: CHOL   No results found for this basename: HDL   No results found for this basename: LDLCALC   No results found for this basename: TRIG   No results found for this basename: CHOLHDL   No results found for this basename: HGBA1C   Lab Results  Component Value Date   ALT 18 05/27/2010   AST 32 05/27/2010   ALKPHOS 54 05/27/2010   BILITOT 0.9 05/27/2010   Lab Results  Component Value Date   TSH 2.448 05/27/2010

## 2010-10-13 NOTE — Patient Instructions (Signed)
Your physician wants you to follow-up in: 1 YEAR You will receive a reminder letter in the mail two months in advance. If you don't receive a letter, please call our office to schedule the follow-up appointment.  

## 2010-11-29 LAB — CBC
HCT: 42.8
MCV: 89
RBC: 4.81
WBC: 5.5

## 2010-12-10 ENCOUNTER — Telehealth: Payer: Self-pay | Admitting: Cardiology

## 2010-12-10 NOTE — Telephone Encounter (Signed)
Reviewed with Peyton Najjar and no contraindications to current medications Pt is aware. Mylo Red RN

## 2010-12-10 NOTE — Telephone Encounter (Signed)
Pt wanted you to know that he is taking Montelukast 10 mg/Fluticasone nasal spray/Predinisone 10 mg. Please let him know if these are ok for him to take.  Reply by email [email or web address]

## 2010-12-16 LAB — DIFFERENTIAL
Basophils Absolute: 0
Basophils Absolute: 0.1
Basophils Relative: 1
Basophils Relative: 1
Eosinophils Absolute: 0.3
Eosinophils Relative: 4
Eosinophils Relative: 6 — ABNORMAL HIGH
Monocytes Absolute: 0.4
Monocytes Absolute: 0.5
Monocytes Relative: 7
Neutro Abs: 4.2

## 2010-12-16 LAB — CARDIAC PANEL(CRET KIN+CKTOT+MB+TROPI)
CK, MB: 1.6
CK, MB: 1.7
Total CK: 57
Troponin I: 0.03
Troponin I: 0.03

## 2010-12-16 LAB — COMPREHENSIVE METABOLIC PANEL
ALT: 23
AST: 21
Albumin: 3.4 — ABNORMAL LOW
Alkaline Phosphatase: 57
BUN: 17
Chloride: 100
Potassium: 3.8
Sodium: 135
Total Bilirubin: 0.5

## 2010-12-16 LAB — CBC
HCT: 38.5 — ABNORMAL LOW
HCT: 40.9
Hemoglobin: 13.4
MCHC: 35
Platelets: 226
RDW: 13.6
WBC: 6.3

## 2010-12-20 ENCOUNTER — Ambulatory Visit (INDEPENDENT_AMBULATORY_CARE_PROVIDER_SITE_OTHER): Payer: Medicare PPO

## 2010-12-20 VITALS — BP 118/80 | HR 93 | Ht 70.0 in | Wt 197.0 lb

## 2010-12-20 DIAGNOSIS — I4891 Unspecified atrial fibrillation: Secondary | ICD-10-CM

## 2010-12-20 MED ORDER — METOPROLOL SUCCINATE ER 50 MG PO TB24: ORAL_TABLET | ORAL | Status: DC

## 2010-12-20 NOTE — Patient Instructions (Signed)
Your physician recommends that you schedule a follow-up appointment in:  AS SCHEDULED  Your physician has recommended you make the following change in your medication:  INCREASE METOPROLOL 50 MG 2 TABS  TWICE DAILY Your physician has requested that you regularly monitor and record your blood pressure readings at home. Please use the same machine at the same time of day to check your readings and record them to bring to your follow-up visit. CHECK B/P OVER NEXT WEEK OR SO AND CALL NEXT WEEK WITH READINGS  AND UPDATE  .[name]

## 2010-12-21 ENCOUNTER — Other Ambulatory Visit: Payer: Self-pay | Admitting: Cardiology

## 2010-12-21 MED ORDER — DABIGATRAN ETEXILATE MESYLATE 150 MG PO CAPS: 150.0000 mg | ORAL_CAPSULE | Freq: Two times a day (BID) | ORAL | Status: DC

## 2010-12-23 ENCOUNTER — Ambulatory Visit: Payer: Medicare PPO | Admitting: Pulmonary Disease

## 2010-12-29 ENCOUNTER — Encounter: Payer: Self-pay | Admitting: Gastroenterology

## 2010-12-29 ENCOUNTER — Ambulatory Visit (INDEPENDENT_AMBULATORY_CARE_PROVIDER_SITE_OTHER): Payer: Medicare PPO | Admitting: Gastroenterology

## 2010-12-29 VITALS — BP 122/64 | HR 60 | Ht 70.0 in | Wt 201.0 lb

## 2010-12-29 DIAGNOSIS — K2270 Barrett's esophagus without dysplasia: Secondary | ICD-10-CM

## 2010-12-29 DIAGNOSIS — L290 Pruritus ani: Secondary | ICD-10-CM

## 2010-12-29 MED ORDER — OMEPRAZOLE 20 MG PO CPDR: 20.0000 mg | DELAYED_RELEASE_CAPSULE | Freq: Every day | ORAL | Status: DC

## 2010-12-29 NOTE — Progress Notes (Signed)
History of Present Illness: This is a 70 year old male with a history of GERD and Barrett's esophagus. He was out of Prevacid and switch to Prilosec OTC which has controlled his symptoms quite well. He relates a few days of anal itching and, after repeated episodes of scratching, he noted some bright red blood when wiping. He used an over-the-counter cortisone cream for 2 days and his symptoms abated. He underwent upper endoscopy and colonoscopy in August 2011. Denies weight loss, abdominal pain, constipation, diarrhea, change in stool caliber, melena, nausea, vomiting, dysphagia, chest pain.  Current Medications, Allergies, Past Medical History, Past Surgical History, Family History and Social History were reviewed in [employer] record.  Physical Exam: General: Well developed , well nourished, no acute distress Head: Normocephalic and atraumatic Eyes:  sclerae anicteric, EOMI Ears: Normal auditory acuity Mouth: No deformity or lesions Lungs: Clear throughout to auscultation Heart: Regular rate and rhythm; no murmurs, rubs or bruits Abdomen: Soft, non tender and non distended. No masses, hepatosplenomegaly or hernias noted. Normal Bowel sounds Musculoskeletal: Symmetrical with no gross deformities  Pulses:  Normal pulses noted Extremities: No clubbing, cyanosis, edema. Right fifth finger contracture. Neurological: Alert oriented x 4, grossly nonfocal Psychological:  Alert and cooperative. Normal mood and affect  Assessment and Recommendations:  1. Barrett's esophagus. Continue standard antireflux measures. Omeprazole 20 mg every morning long term. Endoscopy for surveillance August 2014.  2. Anal itching with scant hematochezia. I suspect hemorrhoids. He underwent colonoscopy in August 2011 which was normal. If his symptoms are frequently recurrent he is to return for further evaluation.

## 2010-12-29 NOTE — Patient Instructions (Addendum)
Your prescription for omeprazole has been sent to your pharmacy.  Rectal Care instructions have been given to you. The Hydrographic surveyor at [facility] that Dr. Russella Dar recommends is Dr. Dominica Severin at [phone]. Please call for an appointment.  cc: Robert Bellow, MD

## 2011-01-25 ENCOUNTER — Encounter: Payer: Self-pay | Admitting: Pulmonary Disease

## 2011-01-25 ENCOUNTER — Ambulatory Visit (INDEPENDENT_AMBULATORY_CARE_PROVIDER_SITE_OTHER): Payer: Medicare PPO | Admitting: Pulmonary Disease

## 2011-01-25 DIAGNOSIS — G4733 Obstructive sleep apnea (adult) (pediatric): Secondary | ICD-10-CM

## 2011-01-25 DIAGNOSIS — Z23 Encounter for immunization: Secondary | ICD-10-CM

## 2011-01-25 DIAGNOSIS — J310 Chronic rhinitis: Secondary | ICD-10-CM

## 2011-01-25 DIAGNOSIS — R05 Cough: Secondary | ICD-10-CM

## 2011-01-25 DIAGNOSIS — J449 Chronic obstructive pulmonary disease, unspecified: Secondary | ICD-10-CM

## 2011-01-25 MED ORDER — BECLOMETHASONE DIPROPIONATE 80 MCG/ACT IN AERS: 2.0000 | INHALATION_SPRAY | RESPIRATORY_TRACT | Status: DC | PRN

## 2011-01-25 NOTE — Progress Notes (Signed)
Addended by: Tommie Sams on: 01/25/2011 12:13 PM   Modules accepted: Orders

## 2011-01-25 NOTE — Assessment & Plan Note (Signed)
Improved.  Will monitor clinically for now.

## 2011-01-25 NOTE — Assessment & Plan Note (Signed)
He has done well with oral appliance.  Will defer to Dr. Myrtis Ser when he will be ready to have repeat testing with appliance in place.

## 2011-01-25 NOTE — Assessment & Plan Note (Signed)
Improved.  He can use benzonatate as needed.

## 2011-01-25 NOTE — Patient Instructions (Signed)
Flu shot today ° °Follow up in 4 months °

## 2011-01-25 NOTE — Progress Notes (Signed)
Chief Complaint  Patient presents with  . Follow-up    Pt states he has been using his oral appliance x 5-6 weeks now. Pt states he feels more rested in the mornings. Pt states his cough is gone. Pt wants flu shot    History of Present Illness: [name] is a 70 y.o. male chronic cough with chronic obstructive asthma, rhinits, GERD. Also with moderate sleep apnea.  His cough is doing better.  He was able to stop Qvar for a while.  He started this again recently because he was getting occasional rattle in his chest.  He is using this about twice per week.  His sinuses have been doing okay.  He does not have much of a cough.  His reflux is controlled.  He has been using oral appliance for 6 weeks.  He feels he is sleeping better.  Past Medical History  Diagnosis Date  . Atrial fibrillation   . Barrett's esophagus   . Paroxysmal atrial fibrillation     Hospitalized on 05/27/10  . GERD (gastroesophageal reflux disease)   . BPH (benign prostatic hyperplasia)   . Barrett esophagus   . Hiatal hernia   . Renal cell carcinoma 2009    right renal mass, followed by Dr. Teodora Medici  . OSA (obstructive sleep apnea) 08/05/10 PSG    AHI 24  . Chronic obstructive asthma     Past Surgical History  Procedure Date  . Exploratory laparotomy, segmental small bowel resection of a hemangioma, repair of incarcerated umbelical hernia 12/1999  . Exploratory laparotomy w/ bowel resection 12/1999    segmental sbr of a hemangioma, repair of incarcerated umbilical hernia  . Renal cancer surgery with rf ablation   . Tonsillectomy   . Basal cell carcinoma excision     Current Outpatient Prescriptions on File Prior to Visit  Medication Sig Dispense Refill  . dabigatran (PRADAXA) 150 MG CAPS Take 1 capsule (150 mg total) by mouth every 12 (twelve) hours.  60 capsule  6  . doxazosin (CARDURA) 4 MG tablet Take 2 mg by mouth at bedtime.       . dutasteride (AVODART) 0.5 MG capsule Take 0.5 mg by mouth  daily.        . metoprolol (TOPROL-XL) 50 MG 24 hr tablet Take 50 mg by mouth 3 (three) times daily. 2 TABS TWICE DAILY        . Multiple Vitamin (MULTIVITAMIN) capsule Take 1 capsule by mouth daily.        Marland Kitchen omeprazole (PRILOSEC) 20 MG capsule Take 1 capsule (20 mg total) by mouth daily.  30 capsule  11    Allergies  Allergen Reactions  . Proair Hfa ([id number])     Increased heart rate    Physical Exam:  Blood pressure 119/72, pulse 60, temperature 98.2 F (36.8 C), temperature source Oral, height 5\' 10"  (1.778 m), weight 209 lb 12.8 oz (95.165 kg), SpO2 97.00%.  General - Thin, healthy  HEENT - Wears glasses, PERRLA, EOMI, Nasal septal deviation, clear sinus drainage, MP 2, decreased AP diameter of oropharynx, no LAN, no thyromegaly  Cardiac - s1s2 irregular, no murmur Chest - CTA  Abd - soft, thin, non-tender, normal bowel sounds  Ext - no e/c/c  Neuro - normal strength Psych - normal mood/behavior  Skin - no rashes  Assessment/Plan:  OSA (obstructive sleep apnea) He has done well with oral appliance.  Will defer to Dr. Myrtis Ser when he will be ready to have repeat testing with  appliance in place.  Rhinitis Improved.  Will monitor clinically for now.  Cough Improved.  He can use benzonatate as needed.      Chronic obstructive asthma He can use Qvar as needed.  Will re-assess his status in 4 months before Spring since that typically has been his worst season.     Outpatient Encounter Prescriptions as of 01/25/2011  Medication Sig Dispense Refill  . benzonatate (TESSALON) 200 MG capsule Take 200 mg by mouth as needed.        . dabigatran (PRADAXA) 150 MG CAPS Take 1 capsule (150 mg total) by mouth every 12 (twelve) hours.  60 capsule  6  . doxazosin (CARDURA) 4 MG tablet Take 2 mg by mouth at bedtime.       . dutasteride (AVODART) 0.5 MG capsule Take 0.5 mg by mouth daily.        . metoprolol (TOPROL-XL) 50 MG 24 hr tablet Take 50 mg by mouth 3 (three) times  daily. 2 TABS TWICE DAILY        . Multiple Vitamin (MULTIVITAMIN) capsule Take 1 capsule by mouth daily.        Marland Kitchen omeprazole (PRILOSEC) 20 MG capsule Take 1 capsule (20 mg total) by mouth daily.  30 capsule  11  . beclomethasone (QVAR) 80 MCG/ACT inhaler Inhale 2 puffs into the lungs as needed.  1 Inhaler  2  . DISCONTD: beclomethasone (QVAR) 80 MCG/ACT inhaler Inhale 2 puffs into the lungs 2 (two) times daily.        Marland Kitchen DISCONTD: cetirizine (ZYRTEC) 10 MG tablet Take 1 tablet (10 mg total) by mouth daily as needed for allergies.      Marland Kitchen DISCONTD: fluticasone (FLONASE) 50 MCG/ACT nasal spray 2 sprays by Nasal route. 1 spray daily as needed       . DISCONTD: HYDROcodone-acetaminophen (LORTAB) 7.5-500 MG per tablet As needed       . DISCONTD: Hydrocortisone (CORTIZONE-10 EX) Apply topically as needed.        Marland Kitchen DISCONTD: zolpidem (AMBIEN) 10 MG tablet Take 10 mg by mouth at bedtime as needed. Once a week as needed         Shonika Kolasinski Pager:  [phone] 01/25/2011, 12:06 PM

## 2011-01-25 NOTE — Assessment & Plan Note (Addendum)
He can use Qvar as needed.  Will re-assess his status in 4 months before Spring since that typically has been his worst season.

## 2011-02-15 ENCOUNTER — Ambulatory Visit (INDEPENDENT_AMBULATORY_CARE_PROVIDER_SITE_OTHER): Payer: Medicare PPO

## 2011-02-15 DIAGNOSIS — J45902 Unspecified asthma with status asthmaticus: Secondary | ICD-10-CM

## 2011-02-15 DIAGNOSIS — J3089 Other allergic rhinitis: Secondary | ICD-10-CM

## 2011-02-15 DIAGNOSIS — Z79899 Other long term (current) drug therapy: Secondary | ICD-10-CM

## 2011-03-07 DIAGNOSIS — N529 Male erectile dysfunction, unspecified: Secondary | ICD-10-CM | POA: Insufficient documentation

## 2011-03-17 ENCOUNTER — Encounter: Payer: Self-pay | Admitting: Gastroenterology

## 2011-03-17 ENCOUNTER — Ambulatory Visit (INDEPENDENT_AMBULATORY_CARE_PROVIDER_SITE_OTHER): Payer: Medicare PPO | Admitting: Gastroenterology

## 2011-03-17 VITALS — BP 124/60 | HR 60 | Ht 70.0 in | Wt 209.0 lb

## 2011-03-17 DIAGNOSIS — L290 Pruritus ani: Secondary | ICD-10-CM

## 2011-03-17 DIAGNOSIS — K921 Melena: Secondary | ICD-10-CM

## 2011-03-17 MED ORDER — KETOCONAZOLE 2 % EX CREA: TOPICAL_CREAM | Freq: Three times a day (TID) | CUTANEOUS | Status: DC

## 2011-03-17 NOTE — Patient Instructions (Signed)
Continue Ketocanozole cream rectally three times a day x 2 weeks and also use the samples of Calmoseptine ointment as needed x 2 weeks to help sooth the rectal area.  Rectal Care instructions given.  cc: Robert Bellow, MD

## 2011-03-17 NOTE — Progress Notes (Signed)
History of Present Illness: This is a 71 year old male who has had persistent anal itching and occasionally notes small amounts of bright red blood on the tissue paper after itching. He underwent colonoscopy in August 2011 and was normal. He states he saw his urologist, Dr. Darvin Neighbours, who found an abnormality on rectal examination and he was advised to return here for followup. He has been using Preparation H cream intermittently with no improvement. Denies weight loss, abdominal pain, constipation, diarrhea, change in stool caliber, melena, nausea, vomiting, dysphagia, reflux symptoms, chest pain.   Current Medications, Allergies, Past Medical History, Past Surgical History, Family History and Social History were reviewed in [employer] record.  Physical Exam: General: Well developed , well nourished, no acute distress Head: Normocephalic and atraumatic Eyes:  sclerae anicteric, EOMI Ears: Normal auditory acuity Mouth: No deformity or lesions Lungs: Clear throughout to auscultation Heart: Regular rate and rhythm; no murmurs, rubs or bruits Abdomen: Soft, non tender and non distended. No masses, hepatosplenomegaly or hernias noted. Normal Bowel sounds Rectal: Anal and perianal dermatitis with patchy erythema, superficial erosions, fissuring, digital examination unremarkable with Hemoccult negative loose brown stool Musculoskeletal: Symmetrical with no gross deformities  Pulses:  Normal pulses noted Extremities: No clubbing, cyanosis, edema or deformities noted Neurological: Alert oriented x 4, grossly nonfocal Psychological:  Alert and cooperative. Normal mood and affect  Assessment and Recommendations:  1. Perianal/anal dermatitis. Suspected candida. Standard rectal care instructions. Use ketoconazole cream 3 times a day for [redacted] weeks along with a barrier ointment. If symptoms do not respond I will have advised him to followup with his dermatologist.

## 2011-04-26 ENCOUNTER — Telehealth: Payer: Self-pay

## 2011-04-26 MED ORDER — ZOLPIDEM TARTRATE 10 MG PO TABS: 10.0000 mg | ORAL_TABLET | Freq: Every evening | ORAL | Status: DC | PRN

## 2011-04-26 NOTE — Telephone Encounter (Signed)
Chart at nurses station  [id number]

## 2011-04-26 NOTE — Telephone Encounter (Signed)
LMOM THAT RX SENT INTO PHARMACY

## 2011-04-26 NOTE — Telephone Encounter (Signed)
Pt requesting refill on Zolpidem 10mg . [facility] on [address].

## 2011-04-26 NOTE — Telephone Encounter (Signed)
Done. Ready for pick up.  [name] 

## 2011-05-15 ENCOUNTER — Ambulatory Visit (INDEPENDENT_AMBULATORY_CARE_PROVIDER_SITE_OTHER): Payer: Medicare PPO | Admitting: Internal Medicine

## 2011-05-15 VITALS — BP 137/95 | HR 90 | Temp 97.7°F | Resp 18 | Ht 69.75 in | Wt 201.6 lb

## 2011-05-15 DIAGNOSIS — R059 Cough, unspecified: Secondary | ICD-10-CM

## 2011-05-15 DIAGNOSIS — I4891 Unspecified atrial fibrillation: Secondary | ICD-10-CM

## 2011-05-15 DIAGNOSIS — R0602 Shortness of breath: Secondary | ICD-10-CM

## 2011-05-15 DIAGNOSIS — G4700 Insomnia, unspecified: Secondary | ICD-10-CM

## 2011-05-15 DIAGNOSIS — I499 Cardiac arrhythmia, unspecified: Secondary | ICD-10-CM

## 2011-05-15 DIAGNOSIS — R05 Cough: Secondary | ICD-10-CM

## 2011-05-15 DIAGNOSIS — G44209 Tension-type headache, unspecified, not intractable: Secondary | ICD-10-CM

## 2011-05-15 MED ORDER — HYDROCODONE-ACETAMINOPHEN 7.5-500 MG/15ML PO SOLN: 5.0000 mL | Freq: Four times a day (QID) | ORAL | Status: DC | PRN

## 2011-05-15 MED ORDER — ZOLPIDEM TARTRATE 10 MG PO TABS: 10.0000 mg | ORAL_TABLET | Freq: Every evening | ORAL | Status: DC | PRN

## 2011-05-15 NOTE — Progress Notes (Signed)
  Subjective:    Patient ID: [name], male    DOB: 22-May-1940, 71 y.o.   MRN: [id number]  HPI 3 days persistant cough , HA assoc with cough or bending over. May be a little sob, is in afib. Is on pradaxa. No sin [place] infection but right nostril is closed.    Review of Systems See hx    Objective:   Physical Exam  Constitutional: He is oriented to person, place, and time. He appears well-developed and well-nourished. No distress.  HENT:  Nose: Mucosal edema and rhinorrhea present. Right sinus exhibits no maxillary sinus tenderness and no frontal sinus tenderness. Left sinus exhibits no maxillary sinus tenderness and no frontal sinus tenderness.  Mouth/Throat: Uvula is midline, oropharynx is clear and moist and mucous membranes are normal.  Cardiovascular: Normal rate and normal pulses.  An irregularly irregular rhythm present. Exam reveals no gallop.   Pulmonary/Chest: Effort normal and breath sounds normal. No apnea. No respiratory distress.       Oximetry 98%  Abdominal: Soft.  Musculoskeletal: Normal range of motion.  Neurological: He is alert and oriented to person, place, and time.  Skin: Skin is warm and dry.  Psychiatric: He has a normal mood and affect.          Assessment & Plan:  Atrial fib with cough HA with cough ,Nasal obstruction  See cardiology this week Lortab elixir for cough Refill Zolpidem 10mg  Homecare discussed  RTC see me this week

## 2011-05-16 ENCOUNTER — Telehealth: Payer: Self-pay

## 2011-05-16 NOTE — Telephone Encounter (Signed)
Pt calling was seen in office 05-15-11 Dr Perrin Maltese was sending rx in for cough medication pt went to pharmacy and it's not their yet, please call in rx to [facility]

## 2011-05-16 NOTE — Telephone Encounter (Signed)
Rx called in to pharmacy. 

## 2011-05-18 ENCOUNTER — Ambulatory Visit: Payer: Medicare PPO | Admitting: Physician Assistant

## 2011-05-18 ENCOUNTER — Encounter: Payer: Self-pay | Admitting: Physician Assistant

## 2011-05-18 ENCOUNTER — Encounter (HOSPITAL_COMMUNITY): Payer: Self-pay | Admitting: General Practice

## 2011-05-18 ENCOUNTER — Inpatient Hospital Stay (HOSPITAL_COMMUNITY)
Admission: AD | Admit: 2011-05-18 | Discharge: 2011-05-19 | DRG: 310 | Disposition: A | Discharge status: Home / Self Care | Payer: Medicare PPO | Source: Ambulatory Visit | Attending: Cardiology | Admitting: Cardiology

## 2011-05-18 ENCOUNTER — Ambulatory Visit (INDEPENDENT_AMBULATORY_CARE_PROVIDER_SITE_OTHER): Payer: Medicare PPO | Admitting: Physician Assistant

## 2011-05-18 VITALS — BP 119/70 | HR 160 | Ht 70.0 in | Wt 203.0 lb

## 2011-05-18 DIAGNOSIS — N400 Enlarged prostate without lower urinary tract symptoms: Secondary | ICD-10-CM | POA: Diagnosis present

## 2011-05-18 DIAGNOSIS — Z6828 Body mass index (BMI) 28.0-28.9, adult: Secondary | ICD-10-CM

## 2011-05-18 DIAGNOSIS — Z8553 Personal history of malignant neoplasm of renal pelvis: Secondary | ICD-10-CM

## 2011-05-18 DIAGNOSIS — I059 Rheumatic mitral valve disease, unspecified: Secondary | ICD-10-CM | POA: Diagnosis present

## 2011-05-18 DIAGNOSIS — K219 Gastro-esophageal reflux disease without esophagitis: Secondary | ICD-10-CM | POA: Diagnosis present

## 2011-05-18 DIAGNOSIS — I4891 Unspecified atrial fibrillation: Secondary | ICD-10-CM

## 2011-05-18 DIAGNOSIS — R05 Cough: Secondary | ICD-10-CM

## 2011-05-18 DIAGNOSIS — G4733 Obstructive sleep apnea (adult) (pediatric): Secondary | ICD-10-CM

## 2011-05-18 DIAGNOSIS — K2270 Barrett's esophagus without dysplasia: Secondary | ICD-10-CM | POA: Diagnosis present

## 2011-05-18 DIAGNOSIS — J449 Chronic obstructive pulmonary disease, unspecified: Secondary | ICD-10-CM

## 2011-05-18 DIAGNOSIS — J4489 Other specified chronic obstructive pulmonary disease: Secondary | ICD-10-CM | POA: Diagnosis present

## 2011-05-18 DIAGNOSIS — Z79899 Other long term (current) drug therapy: Secondary | ICD-10-CM

## 2011-05-18 DIAGNOSIS — Z888 Allergy status to other drugs, medicaments and biological substances status: Secondary | ICD-10-CM

## 2011-05-18 DIAGNOSIS — Z87891 Personal history of nicotine dependence: Secondary | ICD-10-CM

## 2011-05-18 DIAGNOSIS — G4700 Insomnia, unspecified: Secondary | ICD-10-CM

## 2011-05-18 LAB — CBC
HCT: 41.1 % (ref 39.0–52.0)
Hemoglobin: 13.9 g/dL (ref 13.0–17.0)
MCH: 29.7 pg (ref 26.0–34.0)
MCHC: 33.8 g/dL (ref 30.0–36.0)
MCV: 87.8 fL (ref 78.0–100.0)
Platelets: 224 K/uL (ref 150–400)
RBC: 4.68 MIL/uL (ref 4.22–5.81)
RDW: 13.3 % (ref 11.5–15.5)
WBC: 6.1 K/uL (ref 4.0–10.5)

## 2011-05-18 LAB — DIFFERENTIAL
Basophils Absolute: 0 10*3/uL (ref 0.0–0.1)
Eosinophils Absolute: 0.2 10*3/uL (ref 0.0–0.7)
Eosinophils Relative: 4 % (ref 0–5)
Lymphocytes Relative: 17 % (ref 12–46)

## 2011-05-18 LAB — COMPREHENSIVE METABOLIC PANEL WITH GFR
ALT: 13 U/L (ref 0–53)
AST: 16 U/L (ref 0–37)
Albumin: 3.4 g/dL — ABNORMAL LOW (ref 3.5–5.2)
Alkaline Phosphatase: 69 U/L (ref 39–117)
BUN: 13 mg/dL (ref 6–23)
CO2: 27 meq/L (ref 19–32)
Calcium: 9.2 mg/dL (ref 8.4–10.5)
Chloride: 99 meq/L (ref 96–112)
Creatinine, Ser: 1.16 mg/dL (ref 0.50–1.35)
GFR calc Af Amer: 71 mL/min — ABNORMAL LOW
GFR calc non Af Amer: 61 mL/min — ABNORMAL LOW
Glucose, Bld: 101 mg/dL — ABNORMAL HIGH (ref 70–99)
Potassium: 3.9 meq/L (ref 3.5–5.1)
Sodium: 137 meq/L (ref 135–145)
Total Bilirubin: 0.3 mg/dL (ref 0.3–1.2)
Total Protein: 7.2 g/dL (ref 6.0–8.3)

## 2011-05-18 LAB — CARDIAC PANEL(CRET KIN+CKTOT+MB+TROPI)
CK, MB: 1.7 ng/mL (ref 0.3–4.0)
Relative Index: INVALID (ref 0.0–2.5)
Total CK: 49 U/L (ref 7–232)
Troponin I: <0.3 ng/mL

## 2011-05-18 LAB — TSH: TSH: 1.483 u[IU]/mL (ref 0.350–4.500)

## 2011-05-18 LAB — PROTIME-INR
INR: 1.17 (ref 0.00–1.49)
Prothrombin Time: 15.1 seconds (ref 11.6–15.2)

## 2011-05-18 LAB — MAGNESIUM: Magnesium: 2.2 mg/dL (ref 1.5–2.5)

## 2011-05-18 MED ORDER — ADULT MULTIVITAMIN W/MINERALS CH
1.0000 | ORAL_TABLET | Freq: Every day | ORAL | Status: DC
  Administered 2011-05-19: 1 via ORAL
  Filled 2011-05-18: qty 1

## 2011-05-18 MED ORDER — ACETAMINOPHEN 325 MG PO TABS: 650.0000 mg | ORAL_TABLET | ORAL | Status: DC | PRN

## 2011-05-18 MED ORDER — METOPROLOL SUCCINATE ER 50 MG PO TB24
50.0000 mg | ORAL_TABLET | Freq: Every day | ORAL | Status: DC
  Administered 2011-05-18 – 2011-05-19 (×2): 50 mg via ORAL
  Filled 2011-05-18 (×2): qty 1

## 2011-05-18 MED ORDER — FLUTICASONE PROPIONATE HFA 44 MCG/ACT IN AERO
1.0000 | INHALATION_SPRAY | Freq: Two times a day (BID) | RESPIRATORY_TRACT | Status: DC
  Filled 2011-05-18: qty 10.6

## 2011-05-18 MED ORDER — DOXAZOSIN MESYLATE 2 MG PO TABS
2.0000 mg | ORAL_TABLET | Freq: Every day | ORAL | Status: DC
  Administered 2011-05-18: 2 mg via ORAL
  Filled 2011-05-18 (×2): qty 1

## 2011-05-18 MED ORDER — MONTELUKAST SODIUM 10 MG PO TABS
10.0000 mg | ORAL_TABLET | Freq: Every day | ORAL | Status: DC
  Administered 2011-05-18: 10 mg via ORAL
  Filled 2011-05-18 (×2): qty 1

## 2011-05-18 MED ORDER — KETOCONAZOLE 2 % EX CREA
TOPICAL_CREAM | Freq: Three times a day (TID) | CUTANEOUS | Status: DC
  Filled 2011-05-18: qty 15

## 2011-05-18 MED ORDER — ONDANSETRON HCL 4 MG/2ML IJ SOLN: 4.0000 mg | Freq: Four times a day (QID) | INTRAMUSCULAR | Status: DC | PRN

## 2011-05-18 MED ORDER — LORATADINE 10 MG PO TABS
10.0000 mg | ORAL_TABLET | Freq: Every day | ORAL | Status: DC
  Administered 2011-05-19: 10 mg via ORAL
  Filled 2011-05-18: qty 1

## 2011-05-18 MED ORDER — MULTIVITAMINS PO CAPS: 1.0000 | ORAL_CAPSULE | Freq: Every day | ORAL | Status: DC

## 2011-05-18 MED ORDER — FLUTICASONE PROPIONATE 50 MCG/ACT NA SUSP: 2.0000 | NASAL | Status: DC

## 2011-05-18 MED ORDER — TAMSULOSIN HCL 0.4 MG PO CAPS
0.4000 mg | ORAL_CAPSULE | Freq: Every day | ORAL | Status: DC
  Administered 2011-05-19: 0.4 mg via ORAL
  Filled 2011-05-18: qty 1

## 2011-05-18 MED ORDER — SODIUM CHLORIDE 0.9 % IJ SOLN: 3.0000 mL | INTRAMUSCULAR | Status: DC | PRN

## 2011-05-18 MED ORDER — SODIUM CHLORIDE 0.9 % IJ SOLN
3.0000 mL | Freq: Two times a day (BID) | INTRAMUSCULAR | Status: DC
  Administered 2011-05-19: 3 mL via INTRAVENOUS

## 2011-05-18 MED ORDER — NITROGLYCERIN 0.4 MG SL SUBL: 0.4000 mg | SUBLINGUAL_TABLET | SUBLINGUAL | Status: DC | PRN

## 2011-05-18 MED ORDER — DUTASTERIDE 0.5 MG PO CAPS
0.5000 mg | ORAL_CAPSULE | Freq: Every day | ORAL | Status: DC
  Administered 2011-05-19: 0.5 mg via ORAL
  Filled 2011-05-18: qty 1

## 2011-05-18 MED ORDER — PANTOPRAZOLE SODIUM 40 MG PO TBEC
40.0000 mg | DELAYED_RELEASE_TABLET | Freq: Every day | ORAL | Status: DC
  Administered 2011-05-19: 40 mg via ORAL
  Filled 2011-05-18: qty 1

## 2011-05-18 MED ORDER — SODIUM CHLORIDE 0.9 % IV SOLN: 250.0000 mL | INTRAVENOUS | Status: DC | PRN

## 2011-05-18 MED ORDER — PE-SHARK LIVER OIL-COCOA BUTTR 0.25-3-85.5 % RE SUPP
1.0000 | RECTAL | Status: DC | PRN
  Filled 2011-05-18: qty 1

## 2011-05-18 MED ORDER — ZOLPIDEM TARTRATE 5 MG PO TABS: 10.0000 mg | ORAL_TABLET | Freq: Every evening | ORAL | Status: DC | PRN

## 2011-05-18 MED ORDER — HYDROCODONE-ACETAMINOPHEN 7.5-500 MG/15ML PO SOLN: 5.0000 mL | Freq: Four times a day (QID) | ORAL | Status: DC | PRN

## 2011-05-18 MED ORDER — DILTIAZEM HCL 100 MG IV SOLR
10.0000 mg/h | INTRAVENOUS | Status: DC
  Administered 2011-05-18 – 2011-05-19 (×2): 10 mg/h via INTRAVENOUS
  Filled 2011-05-18 (×2): qty 100

## 2011-05-18 MED ORDER — DABIGATRAN ETEXILATE MESYLATE 150 MG PO CAPS
150.0000 mg | ORAL_CAPSULE | Freq: Two times a day (BID) | ORAL | Status: DC
  Administered 2011-05-18 – 2011-05-19 (×2): 150 mg via ORAL
  Filled 2011-05-18 (×3): qty 1

## 2011-05-18 NOTE — H&P (Signed)
Admission History and Physical   Date: 05/18/2011   Name: [name]  DOB: [birth year]  MRN: [id number]   PCP: Dr. Perrin Maltese  Primary Cardiologist: Dr. [name]  Primary Electrophysiologist: None   History of Present Illness:  [name] is a 71 y.o. male who presents for follow up on AFib.   He has a history of paroxysmal atrial fibrillation. He was last admitted 04/2010. He has been on Pradaxa since that time. At that time, he spontaneously converted to normal sinus rhythm. Last nuclear study was in 2007 was negative for ischemia. Echocardiogram 07/2010: EF 55-65%, grade 1 diastolic dysfunction, mild MR, mild LAE. Other history includes GERD, Barrett's esophagus, renal carcinoma status post ablative therapy, sleep apnea, asthma, BPH. He denies a history of diabetes, hypothyroidism or stroke.   In the last several weeks he has noted a headache as well as a cough. He's had similar symptoms in the past with atrial fibrillation. He saw Dr. Perrin Maltese last week with complaints of cough. He was placed on cough medication. He was noted to be in atrial fibrillation with rapid ventricular rate. He was set up for follow up today. The patient denies chest pain, shortness of breath, syncope, orthopnea, PND or significant pedal edema. He denies palpitations.    Past Medical History   Diagnosis  Date   .  Atrial fibrillation    .  Barrett's esophagus    .  Paroxysmal atrial fibrillation      Hospitalized on 05/27/10   .  GERD (gastroesophageal reflux disease)    .  BPH (benign prostatic hyperplasia)    .  Barrett esophagus    .  Hiatal hernia    .  Renal cell carcinoma  2009     right renal mass, followed by Dr. Teodora Medici   .  OSA (obstructive sleep apnea)  08/05/10 PSG     AHI 24   .  Chronic obstructive asthma      Past Surgical History   Procedure  Date   .  Exploratory laparotomy, segmental small bowel resection of a hemangioma, repair of incarcerated umbelical hernia  12/1999   .   Exploratory laparotomy w/ bowel resection  12/1999     segmental sbr of a hemangioma, repair of incarcerated umbilical hernia   .  Renal cancer surgery with rf ablation    .  Tonsillectomy    .  Basal cell carcinoma excision      Current Outpatient Prescriptions   Medication  Sig  Dispense  Refill   .  beclomethasone (QVAR) 80 MCG/ACT inhaler  Inhale 2 puffs into the lungs as needed.  1 Inhaler  2   .  Cetirizine HCl 10 MG CAPS  Take by mouth as directed.     .  dabigatran (PRADAXA) 150 MG CAPS  Take 1 capsule (150 mg total) by mouth every 12 (twelve) hours.  60 capsule  6   .  doxazosin (CARDURA) 4 MG tablet  Take 2 mg by mouth at bedtime.     .  dutasteride (AVODART) 0.5 MG capsule  Take 0.5 mg by mouth daily.     .  fluticasone (FLONASE) 50 MCG/ACT nasal spray  Place 2 sprays into the nose as directed.     Marland Kitchen  HYDROcodone-acetaminophen (LORTAB) 7.5-500 MG/15ML solution  Take 5 mLs by mouth every 6 (six) hours as needed for pain.  240 mL  0   .  ketoconazole (NIZORAL) 2 % cream  Apply topically  3 (three) times daily.  15 g  0   .  metoprolol (TOPROL-XL) 50 MG 24 hr tablet  Take 50 mg by mouth 3 (three) times daily. 2 TABS TWICE DAILY     .  montelukast (SINGULAIR) 10 MG tablet  Take 10 mg by mouth as directed.     .  Multiple Vitamin (MULTIVITAMIN) capsule  Take 1 capsule by mouth daily.     Marland Kitchen  omeprazole (PRILOSEC) 20 MG capsule  Take 1 capsule (20 mg total) by mouth daily.  30 capsule  11   .  PE-Shark Liver Oil-Cocoa Buttr (PREP-HEM RE)  Place rectally. Uses as directed     .  Tamsulosin HCl (FLOMAX) 0.4 MG CAPS  Take 0.4 mg by mouth as directed.     .  zolpidem (AMBIEN) 10 MG tablet  Take 1 tablet (10 mg total) by mouth at bedtime as needed for sleep.  90 tablet  3     Allergies:  Allergies   Allergen  Reactions   .  Proair Hfa (WUJ:[id number])      Increased heart rate     History   Substance Use Topics   .  Smoking status:  Former Smoker -- 10 years     Types:  Cigars   .   Smokeless tobacco:  Never Used     Comment: quit when he was 71 years old    .  Alcohol Use:  No     Family History   Problem  Relation  Age of Onset   .  Stroke  Mother    .  Obesity  Brother    .  Asthma  Paternal Grandmother    .  Heart attack  Paternal Grandfather    .  Kidney cancer  Paternal Aunt    .  Colon cancer  Neg Hx      ROS: Please see the history of present illness. All other systems reviewed and negative.   PHYSICAL EXAM:  VS: BP 119/70  Pulse 160  Ht 5\' 10"  (1.778 m)  Wt 203 lb (92.08 kg)  BMI 29.13 kg/m2   Well nourished, well developed, in no acute distress  HEENT: normal  Neck: no JVD  Endo: no thyromegaly  Cardiac: normal S1, S2; rapid irregularly irregular rhythm  Lungs: clear to auscultation bilaterally, no wheezing, rhonchi or rales  Abd: soft, nontender, no hepatomegaly  Ext: no edema  Skin: warm and dry  Neuro: CNs 2-12 intact, no focal abnormalities noted  Psych: normal affect   EKG: Atrial fibrillation, rate 160, left axis deviation, no acute changes   ASSESSMENT AND PLAN:   1.  Atrial fibrillation  He has a very rapid rate. However, he is fairly asymptomatic. I discussed the case today with Dr. Daleen Squibb. We have recommended admission to the hospital. I discussed this with the patient and he is in agreement. He will be admitted for rate control. I will place him on IV diltiazem and reduce his Toprol to 50 mg QD (to avoid rebound tachy). Check serial markers, TSH. Keep NPO after midnight. May need to proceed with DCCV in AM. He is clear that he has taken Pradaxa bid for the last several mos without missing a dose. Consider putting him on Flecainide at discharge with plans for outpatient ETT-Myoview to assess for ischemic heart disease. If he has no pro-arrhythmia or ischemia on his nuclear images he would be able to stay on the flecainide.    2.  Chronic  obstructive asthma  Continue current regimen    3.  GERD  Continue current regimen    4.   OSA (obstructive sleep apnea)  Continue CPAP in hospital.     Signed,  Tereso Newcomer, PA-C 4:04 PM 05/18/2011   I have taken a history, reviewed medications, allergies, PMH, SH, FH, and reviewed ROS and examined the patient.  I agree with the assessment and plan.Jesse Sans. Daleen Squibb, MD, [facility] Pager:  [phone]

## 2011-05-18 NOTE — Progress Notes (Signed)
[address] [place], [place]  [address] Phone: [phone] Fax:  [phone]  Date:  05/18/2011   Name:  [name]       DOB:  23-Feb-1941 MRN:  [id number]  PCP:  Dr. Perrin Maltese  Primary Cardiologist:  Dr. [name]  Primary Electrophysiologist:  None    History of Present Illness: [name] is a 71 y.o. male who presents for follow up on AFib.  He has a history of paroxysmal atrial fibrillation.  He was last admitted 04/2010.  He has been on Pradaxa since that time.  At that time, he spontaneously converted to normal sinus rhythm.  Last nuclear study was in 2007 was negative for ischemia.  Echocardiogram 07/2010: EF 55-65%, grade 1 diastolic dysfunction, mild MR, mild LAE.  Other history includes GERD, Barrett's esophagus, renal carcinoma status post ablative therapy, sleep apnea, asthma, BPH.  He denies a history of diabetes, hypothyroidism or stroke.  In the last several weeks he has noted a headache as well as a cough.  He's had similar symptoms in the past with atrial fibrillation. He saw Dr. Perrin Maltese last week with complaints of cough.  He was placed on cough medication.  He was noted to be in atrial fibrillation with rapid ventricular rate.  He was set up for follow up today.  The patient denies chest pain, shortness of breath, syncope, orthopnea, PND or significant pedal edema.  He denies palpitations.    Past Medical History  Diagnosis Date  . Atrial fibrillation   . Barrett's esophagus   . Paroxysmal atrial fibrillation     Hospitalized on 05/27/10  . GERD (gastroesophageal reflux disease)   . BPH (benign prostatic hyperplasia)   . Barrett esophagus   . Hiatal hernia   . Renal cell carcinoma 2009    right renal mass, followed by Dr. Teodora Medici  . OSA (obstructive sleep apnea) 08/05/10 PS[facility]    AHI 24  . Chronic obstructive asthma     Past Surgical History  Procedure Date  . Exploratory laparotomy, segmental small bowel resection of a  hemangioma, repair of incarcerated umbelical hernia 12/1999  . Exploratory laparotomy w/ bowel resection 12/1999    segmental sbr of a hemangioma, repair of incarcerated umbilical hernia  . Renal cancer surgery with rf ablation   . Tonsillectomy   . Basal cell carcinoma excision      Current Outpatient Prescriptions  Medication Sig Dispense Refill  . beclomethasone (QVAR) 80 MCG/ACT inhaler Inhale 2 puffs into the lungs as needed.  1 Inhaler  2  . Cetirizine HCl 10 MG CAPS Take by mouth as directed.      . dabigatran (PRADAXA) 150 MG CAPS Take 1 capsule (150 mg total) by mouth every 12 (twelve) hours.  60 capsule  6  . doxazosin (CARDURA) 4 MG tablet Take 2 mg by mouth at bedtime.       . dutasteride (AVODART) 0.5 MG capsule Take 0.5 mg by mouth daily.        . fluticasone (FLONASE) 50 MCG/ACT nasal spray Place 2 sprays into the nose as directed.      Marland Kitchen HYDROcodone-acetaminophen (LORTAB) 7.5-500 MG/15ML solution Take 5 mLs by mouth every 6 (six) hours as needed for pain.  240 mL  0  . ketoconazole (NIZORAL) 2 % cream Apply topically 3 (three) times daily.  15 g  0  . metoprolol (TOPROL-XL) 50 MG 24 hr tablet Take 50 mg by mouth 3 (three) times daily.  2 TABS TWICE DAILY        . montelukast (SINGULAIR) 10 MG tablet Take 10 mg by mouth as directed.      . Multiple Vitamin (MULTIVITAMIN) capsule Take 1 capsule by mouth daily.        Marland Kitchen omeprazole (PRILOSEC) 20 MG capsule Take 1 capsule (20 mg total) by mouth daily.  30 capsule  11  . PE-Shark Liver Oil-Cocoa Buttr (PREP-HEM RE) Place rectally. Uses as directed       . Tamsulosin HCl (FLOMAX) 0.4 MG CAPS Take 0.4 mg by mouth as directed.       . zolpidem (AMBIEN) 10 MG tablet Take 1 tablet (10 mg total) by mouth at bedtime as needed for sleep.  90 tablet  3    Allergies: Allergies  Allergen Reactions  . Proair Hfa (ZOX:[id number])     Increased heart rate    History  Substance Use Topics  . Smoking status: Former Smoker -- 10 years     Types: Cigars  . Smokeless tobacco: Never Used   Comment: quit when he was 71 years old  . Alcohol Use: No     Family History  Problem Relation Age of Onset  . Stroke Mother   . Obesity Brother   . Asthma Paternal Grandmother   . Heart attack Paternal Grandfather   . Kidney cancer Paternal Aunt   . Colon cancer Neg Hx      ROS:  Please see the history of present illness.   All other systems reviewed and negative.   PHYSICAL EXAM: VS:  BP 119/70  Pulse 160  Ht 5\' 10"  (1.778 m)  Wt 203 lb (92.08 kg)  BMI 29.13 kg/m2 Well nourished, well developed, in no acute distress HEENT: normal Neck: no JVD Endo: no thyromegaly Cardiac:  normal S1, S2; rapid irregularly irregular rhythm Lungs:  clear to auscultation bilaterally, no wheezing, rhonchi or rales Abd: soft, nontender, no hepatomegaly Ext: no edema Skin: warm and dry Neuro:  CNs 2-12 intact, no focal abnormalities noted Psych: normal affect  EKG:  Atrial fibrillation, rate 160, left axis deviation, no acute changes  ASSESSMENT AND PLAN:  1. Atrial fibrillation  He has a very rapid rate.  However, he is fairly asymptomatic.  I discussed the case today with Dr. Daleen Squibb.  We have recommended admission to the hospital.  I discussed this with the patient and he is in agreement.  He will be admitted for rate control.  I will place him on IV diltiazem and reduce his Toprol to 50 mg QD (to avoid rebound tachy).  Check serial markers, TSH.  Keep NPO after midnight.  May need to proceed with DCCV in AM.  He is clear that he has taken Pradaxa bid for the last several mos without missing a dose.  Consider putting him on Flecainide at discharge with plans for outpatient ETT-Myoview to assess for ischemic heart disease.  If he has no pro-arrhythmia or ischemia on his nuclear images he would be able to stay on the flecainide.     2. Chronic obstructive asthma  Continue current regimen   3. GERD  Continue current regimen   4. OSA  (obstructive sleep apnea)  Continue CPAP in hospital.       Signed, Tereso Newcomer, PA-C  4:04 PM 05/18/2011

## 2011-05-19 ENCOUNTER — Other Ambulatory Visit: Payer: Self-pay

## 2011-05-19 DIAGNOSIS — I4891 Unspecified atrial fibrillation: Principal | ICD-10-CM

## 2011-05-19 LAB — CARDIAC PANEL(CRET KIN+CKTOT+MB+TROPI)
Relative Index: INVALID (ref 0.0–2.5)
Total CK: 41 U/L (ref 7–232)
Total CK: 32 U/L (ref 7–232)

## 2011-05-19 MED ORDER — METOPROLOL SUCCINATE ER 50 MG PO TB24: 50.0000 mg | ORAL_TABLET | Freq: Every day | ORAL | Status: DC

## 2011-05-19 MED ORDER — DILTIAZEM HCL 60 MG PO TABS
60.0000 mg | ORAL_TABLET | Freq: Three times a day (TID) | ORAL | Status: DC
  Administered 2011-05-19 (×2): 60 mg via ORAL
  Filled 2011-05-19 (×4): qty 1

## 2011-05-19 MED ORDER — DILTIAZEM HCL ER COATED BEADS 180 MG PO CP24: 180.0000 mg | ORAL_CAPSULE | Freq: Every day | ORAL | Status: DC

## 2011-05-19 NOTE — Progress Notes (Signed)
Pt ambulated 500 ft without complaints or assistance.  HR 80-90's per tele.

## 2011-05-19 NOTE — Progress Notes (Signed)
Pt ambulated 700 ft in hall without complaints or assistance.  HR 70-80's.  BP post walk 138/85.  60 mg cardizem given at this time, first dose.  Will con't to monitor.

## 2011-05-19 NOTE — Discharge Summary (Signed)
CARDIOLOGY DISCHARGE SUMMARY   Patient ID: [name] MRN: [id number] DOB/AGE: 1940-03-02 71 y.o.  Admit date: 05/18/2011 Discharge date: 05/19/2011  Primary Discharge Diagnosis:  Atrial fib Secondary Discharge Diagnosis:  Patient Active Problem List  Diagnoses  . PAROXYSMAL ATRIAL FIBRILLATION  . GERD  . [name]  . Cough  . Chronic obstructive asthma  . Rhinitis  . OSA (obstructive sleep apnea)    Hospital Course: [name] is a 71 year old male with a history of PAF. He was seen in the office for a follow-up appointment and was in rapid afib. He was admitted for further evaluation and treatment.   He was started on IV Cardizem and his rate control improved. His BB was decreased to prevent bradycardia. His cardiac enzymes were negative for MI and there were no other significant abnormalities in his other labs.   On 3/21, he was evaluated by Dr Tenny Craw. His Cardizem was changed to PO. He had been continued on his Pradaxa and other meds. If this regimen is not effective, Flecainide can be considered as an outpatient.  On 05/19/2011, pm, he was ambulating with improved heart rate control and no other problems. He was considered stable for discharge, to follow-up as an outpatient.   Labs:   Lab Results  Component Value Date   WBC 6.1 05/18/2011   HGB 13.9 05/18/2011   HCT 41.1 05/18/2011   MCV 87.8 05/18/2011   PLT 224 05/18/2011    Lab 05/18/11 1840  NA 137  K 3.9  CL 99  CO2 27  BUN 13  CREATININE 1.16  CALCIUM 9.2  PROT 7.2  BILITOT 0.3  ALKPHOS 69  ALT 13  AST 16  GLUCOSE 101*    Basename 05/19/11 0540 05/18/11 2320 05/18/11 1841  CKTOTAL 32 41 49  CKMB 1.3 1.6 1.7  CKMBINDEX -- -- --  TROPONINI <0.30 <0.30 <0.30    Basename 05/18/11 1840  INR 1.17    Radiology: No results found.  EKG:  19-May-2011 05:20:43  Atrial fibrillation with slow ventricular response Abnormal ECG No significant change since last tracing Vent. rate 57 BPM PR  interval * ms QRS duration 88 ms QT/QTc 444/432 ms P-R-T axes * 6 20  FOLLOW UP PLANS AND APPOINTMENTS Discharge Orders    Future Appointments: Provider: Department: Dept Phone: Center:   06/14/2011 11:45 AM Coralyn Helling, MD [facility] [phone] None   07/18/2011 10:30 AM Jonita Albee, MD [facility] [phone] [facility]  Follow up with Dr Daleen Squibb, the office will call.   Allergies  Allergen Reactions  . Proair Hfa (AOZ:[id number])     Increased heart rate   Medication List  As of 05/19/2011  5:52 PM   STOP taking these medications         Cetirizine HCl 10 MG Caps      HYDROcodone-acetaminophen 7.5-500 MG/15ML solution      ketoconazole 2 % cream      metoprolol 50 MG tablet      PREP-HEM RE         TAKE these medications         AVODART 0.5 MG capsule   Generic drug: dutasteride   Take 0.5 mg by mouth daily.      beclomethasone 80 MCG/ACT inhaler   Commonly known as: QVAR   Inhale 1 puff into the lungs 2 (two) times daily as needed. For shortness of breath      cetirizine 10 MG tablet  Commonly known as: ZYRTEC   Take 10 mg by mouth daily.      dabigatran 150 MG Caps   Commonly known as: PRADAXA   Take 150 mg by mouth every 12 (twelve) hours.      diltiazem 180 MG 24 hr capsule   Commonly known as: CARDIZEM CD   Take 1 capsule (180 mg total) by mouth daily.      doxazosin 4 MG tablet   Commonly known as: CARDURA   Take 2 mg by mouth at bedtime.      fluticasone 50 MCG/ACT nasal spray   Commonly known as: FLONASE   Place 2 sprays into the nose as directed.      metoprolol succinate 50 MG 24 hr tablet   Commonly known as: TOPROL-XL   Take 1 tablet (50 mg total) by mouth daily. Take with or immediately following a meal.      montelukast 10 MG tablet   Commonly known as: SINGULAIR   Take 10 mg by mouth at bedtime.      multivitamin capsule   Take 1 capsule by mouth daily.      omeprazole 20 MG capsule   Commonly known as: PRILOSEC   Take 20  mg by mouth daily.      Tamsulosin HCl 0.4 MG Caps   Commonly known as: FLOMAX   Take 0.4 mg by mouth daily.      zolpidem 10 MG tablet   Commonly known as: AMBIEN   Take 5 mg by mouth at bedtime as needed. For anxiety/insomnia             BRING ALL MEDICATIONS WITH YOU TO FOLLOW UP APPOINTMENTS  Time spent with patient to include physician time: 32 min  Signed: Theodore Demark 05/19/2011, 5:52 PM Co-Sign MD

## 2011-05-19 NOTE — Progress Notes (Signed)
UR Completed. Simmons, Calixto Pavel F [phone]  

## 2011-05-19 NOTE — Progress Notes (Signed)
Subjective:No SOB or dizziness. Objective: Filed Vitals:   05/18/11 1744 05/18/11 2212 05/19/11 0332 05/19/11 0643  BP: 117/78 109/72 96/58 100/62  Pulse: 105 113 57   Temp: 97.3 F (36.3 C) 97.2 F (36.2 C) 97.5 F (36.4 C)   TempSrc: Oral Oral Oral   Resp: 20 18 16    Weight:   199 lb 8.3 oz (90.5 kg)   SpO2: 95% 95% 98%    Weight change:   Intake/Output Summary (Last 24 hours) at 05/19/11 0820 Last data filed at 05/19/11 0500  Gross per 24 hour  Intake    107 ml  Output    150 ml  Net    -43 ml    General: Alert, awake, oriented x3, in no acute distress Neck:  JVP is normal Heart: Irregular rate and rhythm, without murmurs, rubs, gallops.  Lungs: Clear to auscultation.  No rales or wheezes. Exemities:  No edema.   Neuro: Grossly intact, nonfocal.  Tele:  Atrial fibrillation.  Rates 70s  Lab Results: Results for orders placed during the hospital encounter of 05/18/11 (from the past 24 hour(s))  PROTIME-INR     Status: Normal   Collection Time   05/18/11  6:40 PM      Component Value Range   Prothrombin Time 15.1  11.6 - 15.2 (seconds)   INR 1.17  0.00 - 1.49   APTT     Status: Abnormal   Collection Time   05/18/11  6:40 PM      Component Value Range   aPTT 42 (*) 24 - 37 (seconds)  CBC     Status: Normal   Collection Time   05/18/11  6:40 PM      Component Value Range   WBC 6.1  4.0 - 10.5 (K/uL)   RBC 4.68  4.22 - 5.81 (MIL/uL)   Hemoglobin 13.9  13.0 - 17.0 (g/dL)   HCT 08.6  57.8 - 46.9 (%)   MCV 87.8  78.0 - 100.0 (fL)   MCH 29.7  26.0 - 34.0 (pg)   MCHC 33.8  30.0 - 36.0 (g/dL)   RDW 62.9  52.8 - 41.3 (%)   Platelets 224  150 - 400 (K/uL)  DIFFERENTIAL     Status: Normal   Collection Time   05/18/11  6:40 PM      Component Value Range   Neutrophils Relative 67  43 - 77 (%)   Neutro Abs 4.1  1.7 - 7.7 (K/uL)   Lymphocytes Relative 17  12 - 46 (%)   Lymphs Abs 1.0  0.7 - 4.0 (K/uL)   Monocytes Relative 12  3 - 12 (%)   Monocytes Absolute 0.7  0.1 -  1.0 (K/uL)   Eosinophils Relative 4  0 - 5 (%)   Eosinophils Absolute 0.2  0.0 - 0.7 (K/uL)   Basophils Relative 1  0 - 1 (%)   Basophils Absolute 0.0  0.0 - 0.1 (K/uL)  TSH     Status: Normal   Collection Time   05/18/11  6:40 PM      Component Value Range   TSH 1.483  0.350 - 4.500 (uIU/mL)  COMPREHENSIVE METABOLIC PANEL     Status: Abnormal   Collection Time   05/18/11  6:40 PM      Component Value Range   Sodium 137  135 - 145 (mEq/L)   Potassium 3.9  3.5 - 5.1 (mEq/L)   Chloride 99  96 - 112 (mEq/L)   CO2 27  19 - 32 (mEq/L)   Glucose, Bld 101 (*) 70 - 99 (mg/dL)   BUN 13  6 - 23 (mg/dL)   Creatinine, Ser 4.54  0.50 - 1.35 (mg/dL)   Calcium 9.2  8.4 - 09.8 (mg/dL)   Total Protein 7.2  6.0 - 8.3 (g/dL)   Albumin 3.4 (*) 3.5 - 5.2 (g/dL)   AST 16  0 - 37 (U/L)   ALT 13  0 - 53 (U/L)   Alkaline Phosphatase 69  39 - 117 (U/L)   Total Bilirubin 0.3  0.3 - 1.2 (mg/dL)   GFR calc non Af Amer 61 (*) >90 (mL/min)   GFR calc Af Amer 71 (*) >90 (mL/min)  MAGNESIUM     Status: Normal   Collection Time   05/18/11  6:40 PM      Component Value Range   Magnesium 2.2  1.5 - 2.5 (mg/dL)  CARDIAC PANEL(CRET KIN+CKTOT+MB+TROPI)     Status: Normal   Collection Time   05/18/11  6:41 PM      Component Value Range   Total CK 49  7 - 232 (U/L)   CK, MB 1.7  0.3 - 4.0 (ng/mL)   Troponin I <0.30  <0.30 (ng/mL)   Relative Index RELATIVE INDEX IS INVALID  0.0 - 2.5   CARDIAC PANEL(CRET KIN+CKTOT+MB+TROPI)     Status: Normal   Collection Time   05/18/11 11:20 PM      Component Value Range   Total CK 41  7 - 232 (U/L)   CK, MB 1.6  0.3 - 4.0 (ng/mL)   Troponin I <0.30  <0.30 (ng/mL)   Relative Index RELATIVE INDEX IS INVALID  0.0 - 2.5   CARDIAC PANEL(CRET KIN+CKTOT+MB+TROPI)     Status: Normal   Collection Time   05/19/11  5:40 AM      Component Value Range   Total CK 32  7 - 232 (U/L)   CK, MB 1.3  0.3 - 4.0 (ng/mL)   Troponin I <0.30  <0.30 (ng/mL)   Relative Index RELATIVE INDEX IS  INVALID  0.0 - 2.5     Studies/Results: No results found.  Medications: I have reviewed the patient's current medications.  Active Problems:  PAROXYSMAL ATRIAL FIBRILLATION Patient still in atrial fibrillation.  Rates are much better. Dilt IV was d/c'd this am I would initiate po dilt  May be a little more efficacious than b blocker.  Continue b blocker but at lower dose. Ambulate.  If HR controlled and asymptomatic, D/C.  If resumes at uncontrolled rate consider flecanide.    OSA (obstructive sleep apnea)  He wears mouth device  Due to see V. Sood this next week to discuss efficacy.  If would be important to clarify as success of afib rx is dependent on it.    LOS: 1 day   Dietrich Pates 05/19/2011, 8:20 AM

## 2011-06-01 ENCOUNTER — Ambulatory Visit (INDEPENDENT_AMBULATORY_CARE_PROVIDER_SITE_OTHER): Payer: Medicare PPO | Admitting: Physician Assistant

## 2011-06-01 ENCOUNTER — Encounter: Payer: Self-pay | Admitting: Physician Assistant

## 2011-06-01 VITALS — BP 132/76 | HR 79 | Resp 18 | Ht 70.0 in | Wt 205.4 lb

## 2011-06-01 DIAGNOSIS — I4891 Unspecified atrial fibrillation: Secondary | ICD-10-CM

## 2011-06-01 NOTE — Patient Instructions (Signed)
Your physician recommends that you schedule a follow-up appointment in: 2-3 months with Dr Daleen Squibb

## 2011-06-01 NOTE — Progress Notes (Signed)
[address] [place], [place]  [address] Phone: [phone] Fax:  [phone]  Date:  06/01/2011   Name:  [name]       DOB:  April 16, 1940 MRN:  [id number]  PCP:  Dr. Perrin Maltese  Primary Cardiologist:  Dr. [name]  Primary Electrophysiologist:  None    History of Present Illness: [name] is a 71 y.o. male who presents for post hospital follow up on AFib.  He has a history of paroxysmal atrial fibrillation.  Admitted 04/2010.  He was placed on Pradaxa at that time.  He spontaneously converted to NSR.  Last nuclear study in 2007 was negative for ischemia.  Echocardiogram 07/2010: EF 55-65%, grade 1 diastolic dysfunction, mild MR, mild LAE.  Other history includes GERD, Barrett's esophagus, renal carcinoma status post ablative therapy, sleep apnea, asthma, BPH.  He denies a history of diabetes, hypothyroidism or stroke.  I saw him in the office 3/20 and he was in AFib with RVR with HRs in the 160s.  We admitted him for rate control.  His rate improved with IV dilt and he was transitioned over to PO.  We had discussed +/- starting Flecainide during his admission from the office.  But, this was not done by the hospital team.    Labs: K 3.9, creatinine 1.16, ALT 13, CE's neg x 3, Hgb 13.9, TSH 1.483.  He is doing well.  The patient denies chest pain, shortness of breath, syncope, orthopnea, PND or significant pedal edema.  No palps.  His HR monitor reads close to what his ECG showed today.  His HR at home has ranged 70-80s.  No problems with his medications.   Past Medical History  Diagnosis Date  . Atrial fibrillation   . Barrett's esophagus   . Paroxysmal atrial fibrillation     Hospitalized on 05/27/10  . GERD (gastroesophageal reflux disease)   . BPH (benign prostatic hyperplasia)   . Barrett esophagus   . Hiatal hernia   . Renal cell carcinoma 2009    right renal mass, followed by Dr. Teodora Medici  . OSA (obstructive sleep apnea) 08/05/10 [facility]    AHI 24   . Chronic obstructive asthma   . Difficult intubation   . Shortness of breath     Past Surgical History  Procedure Date  . Exploratory laparotomy, segmental small bowel resection of a hemangioma, repair of incarcerated umbelical hernia 12/1999  . Exploratory laparotomy w/ bowel resection 12/1999    segmental sbr of a hemangioma, repair of incarcerated umbilical hernia  . Renal cancer surgery with rf ablation   . Tonsillectomy   . Basal cell carcinoma excision      Current Outpatient Prescriptions  Medication Sig Dispense Refill  . beclomethasone (QVAR) 80 MCG/ACT inhaler Inhale 1 puff into the lungs 2 (two) times daily as needed. For shortness of breath      . cetirizine (ZYRTEC) 10 MG tablet Take 10 mg by mouth daily.      . dabigatran (PRADAXA) 150 MG CAPS Take 150 mg by mouth every 12 (twelve) hours.      Marland Kitchen diltiazem (CARDIZEM CD) 180 MG 24 hr capsule Take 1 capsule (180 mg total) by mouth daily.  30 capsule  11  . doxazosin (CARDURA) 4 MG tablet Take 2 mg by mouth at bedtime.       . dutasteride (AVODART) 0.5 MG capsule Take 0.5 mg by mouth daily.        . fluticasone (FLONASE)  50 MCG/ACT nasal spray Place 2 sprays into the nose as directed.      . montelukast (SINGULAIR) 10 MG tablet Take 10 mg by mouth at bedtime.      . Multiple Vitamin (MULTIVITAMIN) capsule Take 1 capsule by mouth daily.        Marland Kitchen omeprazole (PRILOSEC) 20 MG capsule Take 20 mg by mouth daily.      . Tamsulosin HCl (FLOMAX) 0.4 MG CAPS Take 0.4 mg by mouth daily.       Marland Kitchen zolpidem (AMBIEN) 10 MG tablet Take 5 mg by mouth at bedtime as needed. For anxiety/insomnia      . metoprolol succinate (TOPROL-XL) 50 MG 24 hr tablet Take 1 tablet (50 mg total) by mouth daily. Take with or immediately following a meal.  30 tablet  11    Allergies: Allergies  Allergen Reactions  . Proair Hfa (ZOX:[id number])     Increased heart rate    History  Substance Use Topics  . Smoking status: Former Smoker -- 10 years     Types: Cigars  . Smokeless tobacco: Never Used   Comment: quit when he was 71 years old  . Alcohol Use: No     Family History  Problem Relation Age of Onset  . Stroke Mother   . Obesity Brother   . Asthma Paternal Grandmother   . Heart attack Paternal Grandfather   . Kidney cancer Paternal Aunt   . Colon cancer Neg Hx      PHYSICAL EXAM: VS:  BP 132/76  Pulse 79  Resp 18  Ht 5\' 10"  (1.778 m)  Wt 205 lb 6.4 oz (93.169 kg)  BMI 29.47 kg/m2 Well nourished, well developed, in no acute distress HEENT: normal Neck: no JVD Endo: no thyromegaly Cardiac:  normal S1, S2; irreg irreg rhythm, no murmur Lungs:  clear to auscultation bilaterally, no wheezing, rhonchi or rales Abd: soft, nontender, no hepatomegaly Ext: trace - 1+ bilat LE edema Skin: warm and dry Neuro:  CNs 2-12 intact, no focal abnormalities noted Psych: normal affect  EKG:  Atrial fibrillation, rate 79, left axis deviation, no acute changes  ASSESSMENT AND PLAN:  1. PAROXYSMAL ATRIAL FIBRILLATION  He is asymptomatic.  He is on Pradaxa for stroke prophylaxis.  His HR is controlled.  Discussed case with Dr. [name].  At this point, we recommend proceeding with a rate control strategy (AFFIRM).  D/w the patient and he agrees.  He does have some edema.  This may be from the diltiazem.  I asked the patient to monitor this.  Follow up with Dr. [name] in 2-3 mos.        Luna Glasgow, PA-C  11:35 AM 06/01/2011

## 2011-06-13 ENCOUNTER — Other Ambulatory Visit: Payer: Self-pay | Admitting: Family Medicine

## 2011-06-14 ENCOUNTER — Ambulatory Visit: Payer: Medicare PPO | Admitting: Pulmonary Disease

## 2011-06-21 ENCOUNTER — Encounter: Payer: Self-pay | Admitting: Physician Assistant

## 2011-07-04 ENCOUNTER — Encounter: Payer: Self-pay | Admitting: Pulmonary Disease

## 2011-07-04 ENCOUNTER — Ambulatory Visit (INDEPENDENT_AMBULATORY_CARE_PROVIDER_SITE_OTHER)
Admission: RE | Admit: 2011-07-04 | Discharge: 2011-07-04 | Disposition: A | Discharge status: Home / Self Care | Payer: Medicare PPO | Source: Ambulatory Visit | Attending: Pulmonary Disease | Admitting: Pulmonary Disease

## 2011-07-04 ENCOUNTER — Ambulatory Visit (INDEPENDENT_AMBULATORY_CARE_PROVIDER_SITE_OTHER): Payer: Medicare PPO | Admitting: Pulmonary Disease

## 2011-07-04 VITALS — BP 110/62 | HR 84 | Temp 97.5°F | Ht 70.0 in | Wt 204.2 lb

## 2011-07-04 DIAGNOSIS — G4733 Obstructive sleep apnea (adult) (pediatric): Secondary | ICD-10-CM

## 2011-07-04 DIAGNOSIS — M25559 Pain in unspecified hip: Secondary | ICD-10-CM

## 2011-07-04 DIAGNOSIS — L290 Pruritus ani: Secondary | ICD-10-CM

## 2011-07-04 DIAGNOSIS — J449 Chronic obstructive pulmonary disease, unspecified: Secondary | ICD-10-CM

## 2011-07-04 DIAGNOSIS — J310 Chronic rhinitis: Secondary | ICD-10-CM

## 2011-07-04 NOTE — Assessment & Plan Note (Signed)
Stable on nasal irrigation, flonase, and singulair.  He can use zyrtec as needed.

## 2011-07-04 NOTE — Patient Instructions (Signed)
Call Dr. Henrietta Hoover office about oral appliance for sleep apnea Chest xray today>>will call with results Will schedule breathing test (PFT)>>will call with results Follow up in 6 months

## 2011-07-04 NOTE — Assessment & Plan Note (Signed)
He will speak with his chiropractor and primary physician about this.

## 2011-07-04 NOTE — Progress Notes (Signed)
Chief Complaint  Patient presents with  . Sleep Apnea    Pt uses his oral appliance nightly. Pt c/o increase SOB w/ activity. cough is completely gone    History of Present Illness: [name] is a 71 y.o. male chronic cough with chronic obstructive asthma, rhinits, GERD. Also with moderate sleep apnea.  He has been using his oral appliance for sleep apnea.  He feels this has helped his breathing while asleep.  He had sleep test with Dr. Myrtis Ser several months ago, but has not followed up for results.  He is not having any trouble with jaw pain.  His cough has improved to the point that he does not notice it.  He feels Qvar and singulair help.  He has some sinus congestion, and uses nasal irrigation.  He does feel like he gets more winded with activity.  He had his heart medicines adjusted a few months ago for persistent tachycardia.  He has not had recent chest xray or breathing test.  He has noticed problems with back and right hip pain, and peri-anal itching.   Past Medical History  Diagnosis Date  . Atrial fibrillation   . Barrett's esophagus   . Paroxysmal atrial fibrillation     Hospitalized on 05/27/10  . GERD (gastroesophageal reflux disease)   . BPH (benign prostatic hyperplasia)   . Barrett esophagus   . Hiatal hernia   . Renal cell carcinoma 2009    right renal mass, followed by Dr. Teodora Medici  . OSA (obstructive sleep apnea) 08/05/10 [facility]    AHI 24  . Chronic obstructive asthma   . Difficult intubation   . Shortness of breath     Past Surgical History  Procedure Date  . Exploratory laparotomy, segmental small bowel resection of a hemangioma, repair of incarcerated umbelical hernia 12/1999  . Exploratory laparotomy w/ bowel resection 12/1999    segmental sbr of a hemangioma, repair of incarcerated umbilical hernia  . Renal cancer surgery with rf ablation   . Tonsillectomy   . Basal cell carcinoma excision     Current Outpatient Prescriptions on File Prior to Visit   Medication Sig Dispense Refill  . cetirizine (ZYRTEC) 10 MG tablet Take 10 mg by mouth daily.      . dabigatran (PRADAXA) 150 MG CAPS Take 150 mg by mouth every 12 (twelve) hours.      Marland Kitchen diltiazem (CARDIZEM CD) 180 MG 24 hr capsule Take 1 capsule (180 mg total) by mouth daily.  30 capsule  11  . doxazosin (CARDURA) 4 MG tablet Take 2 mg by mouth at bedtime.       . dutasteride (AVODART) 0.5 MG capsule Take 0.5 mg by mouth daily.        . fluticasone (FLONASE) 50 MCG/ACT nasal spray Place 2 sprays into the nose as directed.      . metoprolol succinate (TOPROL-XL) 50 MG 24 hr tablet Take 1 tablet (50 mg total) by mouth daily. Take with or immediately following a meal.  30 tablet  11  . montelukast (SINGULAIR) 10 MG tablet Take 10 mg by mouth at bedtime.      . Multiple Vitamin (MULTIVITAMIN) capsule Take 1 capsule by mouth daily.        Marland Kitchen omeprazole (PRILOSEC) 20 MG capsule Take 20 mg by mouth daily.      Marland Kitchen QVAR 80 MCG/ACT inhaler INHALE 1 PUFF TWICE DAILY  1 Inhaler  2  . Tamsulosin HCl (FLOMAX) 0.4 MG CAPS Take 0.4  mg by mouth daily.       Marland Kitchen zolpidem (AMBIEN) 10 MG tablet Take 5 mg by mouth at bedtime as needed. For anxiety/insomnia        Allergies  Allergen Reactions  . Proair Hfa (AVW:[id number])     Increased heart rate    Physical Exam:  Blood pressure 110/62, pulse 84, temperature 97.5 F (36.4 C), temperature source Oral, height 5\' 10"  (1.778 m), weight 204 lb 3.2 oz (92.625 kg), SpO2 97.00%.  Body mass index is 29.30 kg/(m^2).   General - Thin, healthy  HEENT - Wears glasses, PERRLA, EOMI, Nasal septal deviation, clear sinus drainage, MP 2, decreased AP diameter of oropharynx, no LAN, no thyromegaly  Cardiac - s1s2 irregular, no murmur Chest - CTA  Abd - soft, thin, non-tender, normal bowel sounds  Ext - no e/c/c  Neuro - normal strength Psych - normal mood/behavior   Assessment/Plan:   Outpatient Encounter Prescriptions as of 07/04/2011  Medication Sig Dispense  Refill  . cetirizine (ZYRTEC) 10 MG tablet Take 10 mg by mouth daily.      . dabigatran (PRADAXA) 150 MG CAPS Take 150 mg by mouth every 12 (twelve) hours.      Marland Kitchen diltiazem (CARDIZEM CD) 180 MG 24 hr capsule Take 1 capsule (180 mg total) by mouth daily.  30 capsule  11  . doxazosin (CARDURA) 4 MG tablet Take 2 mg by mouth at bedtime.       . dutasteride (AVODART) 0.5 MG capsule Take 0.5 mg by mouth daily.        . fluticasone (FLONASE) 50 MCG/ACT nasal spray Place 2 sprays into the nose as directed.      . metoprolol succinate (TOPROL-XL) 50 MG 24 hr tablet Take 1 tablet (50 mg total) by mouth daily. Take with or immediately following a meal.  30 tablet  11  . montelukast (SINGULAIR) 10 MG tablet Take 10 mg by mouth at bedtime.      . Multiple Vitamin (MULTIVITAMIN) capsule Take 1 capsule by mouth daily.        Marland Kitchen omeprazole (PRILOSEC) 20 MG capsule Take 20 mg by mouth daily.      Marland Kitchen QVAR 80 MCG/ACT inhaler INHALE 1 PUFF TWICE DAILY  1 Inhaler  2  . Tamsulosin HCl (FLOMAX) 0.4 MG CAPS Take 0.4 mg by mouth daily.       Marland Kitchen zolpidem (AMBIEN) 10 MG tablet Take 5 mg by mouth at bedtime as needed. For anxiety/insomnia        May Manrique Pager:  [phone] 07/04/2011, 4:42 PM

## 2011-07-04 NOTE — Assessment & Plan Note (Signed)
Advised that he should d/w his primary care physician.

## 2011-07-04 NOTE — Assessment & Plan Note (Signed)
I have advised him to f/u with Dr. Myrtis Ser.  If he is not adequately controlled with oral appliance, then he may need to reconsider CPAP therapy.

## 2011-07-04 NOTE — Assessment & Plan Note (Signed)
His cough is better, but he feels like he has been getting more short of breath.  Will arrange for chest xray and PFT to further assess.  Will call him with results of these.  He is to continue Qvar and singulair, pending test results.

## 2011-07-06 ENCOUNTER — Telehealth: Payer: Self-pay | Admitting: Pulmonary Disease

## 2011-07-06 NOTE — Telephone Encounter (Signed)
Dg Chest 2 View  07/04/2011  *RADIOLOGY REPORT*  Clinical Data: Shortness of breath.  Asthma.  CHEST - 2 VIEW  Comparison: 09/14/2010  Findings: Stable cardiomegaly noted without edema.  Aortic contour appears normal.  No pleural effusion.  Central airways appear within normal limits.  IMPRESSION:  1.  Stable mild cardiomegaly.  No acute findings.  Original Report Authenticated By: Dellia Cloud, M.D.    Will have my nurse inform patient that chest xray was normal.  No change to current treatment plan.

## 2011-07-07 NOTE — Telephone Encounter (Signed)
I spoke with patient about results and he verbalized understanding and had no questions 

## 2011-07-07 NOTE — Telephone Encounter (Signed)
lmomtcb x1 for pt 

## 2011-07-14 ENCOUNTER — Other Ambulatory Visit: Payer: Self-pay | Admitting: Cardiology

## 2011-07-15 ENCOUNTER — Other Ambulatory Visit: Payer: Self-pay | Admitting: Cardiology

## 2011-07-18 ENCOUNTER — Encounter: Payer: Self-pay | Admitting: Internal Medicine

## 2011-07-21 ENCOUNTER — Other Ambulatory Visit: Payer: Self-pay | Admitting: Cardiology

## 2011-07-22 NOTE — Telephone Encounter (Signed)
..   Requested Prescriptions   Pending Prescriptions Disp Refills  . PRADAXA 150 MG CAPS [Pharmacy Med Name: PRADAXA 150MG  CAPSULES] 60 capsule 0    Sig: TAKE 1 CAPSULE BY MOUTH EVERY 12 HOURS

## 2011-07-26 ENCOUNTER — Ambulatory Visit (INDEPENDENT_AMBULATORY_CARE_PROVIDER_SITE_OTHER): Payer: Medicare PPO | Admitting: Pulmonary Disease

## 2011-07-26 DIAGNOSIS — R05 Cough: Secondary | ICD-10-CM

## 2011-07-26 LAB — PULMONARY FUNCTION TEST

## 2011-07-26 NOTE — Progress Notes (Signed)
PFT done today. 

## 2011-07-29 ENCOUNTER — Encounter: Payer: Self-pay | Admitting: Pulmonary Disease

## 2011-07-29 ENCOUNTER — Telehealth: Payer: Self-pay | Admitting: Pulmonary Disease

## 2011-07-29 NOTE — Telephone Encounter (Signed)
He can continue xopenex prn for shortness of breath.

## 2011-07-29 NOTE — Telephone Encounter (Signed)
PFT 07/26/11>>FEV1 2.62(91%), FEV1% 66, TLC 6.55(101%), DLCO 94%, +BD  Will have my nurse inform patient that PFT shows changes of asthma.  He should continue Qvar and singulair with prn albuterol.  No other changes to therapy needed.

## 2011-07-29 NOTE — Telephone Encounter (Signed)
I spoke with patient about results and he verbalized understanding and had no questions. Pt states his cardiologists took him off the albuterol due to his AFIB. Pt was given xopenex after his breast and felt fine. Please advise Dr. Craige Cotta thanks

## 2011-08-16 ENCOUNTER — Ambulatory Visit: Payer: Medicare PPO

## 2011-08-16 ENCOUNTER — Encounter: Payer: Self-pay | Admitting: Pulmonary Disease

## 2011-08-16 ENCOUNTER — Ambulatory Visit (INDEPENDENT_AMBULATORY_CARE_PROVIDER_SITE_OTHER): Payer: Medicare PPO | Admitting: Family Medicine

## 2011-08-16 VITALS — BP 139/82 | HR 84 | Temp 98.3°F | Resp 16 | Ht 69.5 in | Wt 198.2 lb

## 2011-08-16 DIAGNOSIS — M25469 Effusion, unspecified knee: Secondary | ICD-10-CM

## 2011-08-16 DIAGNOSIS — M25419 Effusion, unspecified shoulder: Secondary | ICD-10-CM

## 2011-08-16 DIAGNOSIS — M25569 Pain in unspecified knee: Secondary | ICD-10-CM

## 2011-08-16 MED ORDER — TRAMADOL HCL 50 MG PO TABS: 50.0000 mg | ORAL_TABLET | Freq: Three times a day (TID) | ORAL | Status: AC | PRN

## 2011-08-16 NOTE — Patient Instructions (Signed)
Tramadol Ice, elevate.

## 2011-08-16 NOTE — Progress Notes (Signed)
Subjective: Every other day patient does to work out. He does not think he change his routine any. Since the last for 5 days he's been having pain in his left knee, a little in the right. Mild discomfort into the thighs. No warmth. No history of gout. No change in his medications.  Objective: Patient is awake and alert but seemed a little bit confused on his answering questions. Right knee no apparent effusion or tenderness. Left knee is swollen visibly, painful when he bears weight, no crepitance, ligamentous strength seems good,  Assessment: Left knee pain and effusion Mild right knee pain On blood thinner.  Plan X-ray left knee [facility] reading (PRIMARY) by  Dr. Alwyn Ren Possible small posterior joint mouse  Procedure: Sterile technique, aspirated 25 cc fluid.  Initially yellow, but he twitched and it became a little bloody with repositioning of needle.  Injected Depomedrol 80 plus 2 ml of 1% lidocaine.  Tol well.  Toild to elevate, ice.    Sent for analysis  Imp; Nonspecific effusion,  Possibly gout.  RTC if worse or if not doing better in 3 days. No NSAIDS due to his blood thinner.

## 2011-08-17 ENCOUNTER — Ambulatory Visit (INDEPENDENT_AMBULATORY_CARE_PROVIDER_SITE_OTHER): Payer: Medicare PPO | Admitting: Cardiology

## 2011-08-17 ENCOUNTER — Encounter: Payer: Self-pay | Admitting: Family Medicine

## 2011-08-17 ENCOUNTER — Encounter: Payer: Self-pay | Admitting: Cardiology

## 2011-08-17 VITALS — BP 112/58 | HR 86 | Ht 69.5 in | Wt 197.0 lb

## 2011-08-17 DIAGNOSIS — I4891 Unspecified atrial fibrillation: Secondary | ICD-10-CM

## 2011-08-17 LAB — SYNOVIAL CELL COUNT + DIFF, W/ CRYSTALS: Crystals, Fluid: NONE SEEN

## 2011-08-17 NOTE — Patient Instructions (Addendum)
Your physician wants you to follow-up in: 1 year with Dr. Wall. You will receive a reminder letter in the mail two months in advance. If you don't receive a letter, please call our office to schedule the follow-up appointment.  

## 2011-08-17 NOTE — Progress Notes (Signed)
HPI Mr [name] comes in today for his history of chronic A. fib. He denies any palpitations his heart rate in the 70s at rest. He has no problem his exercise tolerance. He denies any bleeding on anticoagulation. He is happy with a combination of diltiazem and metoprolol.  Past Medical History  Diagnosis Date  . Atrial fibrillation   . Barrett's esophagus   . Paroxysmal atrial fibrillation     Hospitalized on 05/27/10  . GERD (gastroesophageal reflux disease)   . BPH (benign prostatic hyperplasia)   . Barrett esophagus   . Hiatal hernia   . Renal cell carcinoma 2009    right renal mass, followed by Dr. Teodora Medici  . OSA (obstructive sleep apnea) 08/05/10 [facility]    AHI 24  . Chronic obstructive asthma   . Difficult intubation   . Shortness of breath     Current Outpatient Prescriptions  Medication Sig Dispense Refill  . cetirizine (ZYRTEC) 10 MG tablet Take 10 mg by mouth daily.      Marland Kitchen diltiazem (CARDIZEM CD) 180 MG 24 hr capsule Take 1 capsule (180 mg total) by mouth daily.  30 capsule  11  . doxazosin (CARDURA) 4 MG tablet Take 2 mg by mouth at bedtime.       . dutasteride (AVODART) 0.5 MG capsule Take 0.5 mg by mouth daily.        . fluticasone (FLONASE) 50 MCG/ACT nasal spray Place 2 sprays into the nose as directed.      . metoprolol succinate (TOPROL-XL) 50 MG 24 hr tablet Take 1 tablet (50 mg total) by mouth daily. Take with or immediately following a meal.  30 tablet  11  . montelukast (SINGULAIR) 10 MG tablet Take 10 mg by mouth at bedtime.      . Multiple Vitamin (MULTIVITAMIN) capsule Take 1 capsule by mouth daily.        Marland Kitchen omeprazole (PRILOSEC) 20 MG capsule Take 20 mg by mouth daily.      Marland Kitchen PRADAXA 150 MG CAPS TAKE 1 CAPSULE BY MOUTH EVERY 12 HOURS  60 capsule  0  . QVAR 80 MCG/ACT inhaler INHALE 1 PUFF TWICE DAILY  1 Inhaler  2  . Tamsulosin HCl (FLOMAX) 0.4 MG CAPS Take 0.4 mg by mouth daily.       . traMADol (ULTRAM) 50 MG tablet Take 1 tablet (50 mg total) by mouth every 8  (eight) hours as needed for pain.  30 tablet  0  . zolpidem (AMBIEN) 10 MG tablet Take 5 mg by mouth at bedtime as needed. For anxiety/insomnia      . DISCONTD: dabigatran (PRADAXA) 150 MG CAPS Take 150 mg by mouth every 12 (twelve) hours.        Allergies  Allergen Reactions  . Proair Hfa (ZOX:WRUEAVWUJ)     Increased heart rate    Family History  Problem Relation Age of Onset  . Stroke Mother   . Obesity Brother   . Asthma Paternal Grandmother   . Heart attack Paternal Grandfather   . Kidney cancer Paternal Aunt   . Colon cancer Neg Hx     History   Social History  . Marital Status: Single    Spouse Name: N/A    Number of Children: N/A  . Years of Education: N/A   Occupational History  . tv producer/construction   . televison Photographer    Social History Main Topics  . Smoking status: Former Smoker -- 10 years    Types: Cigars  .  Smokeless tobacco: Never Used   Comment: quit when he was 71 years old  . Alcohol Use: No  . Drug Use: No  . Sexually Active: Not Currently   Other Topics Concern  . Not on file   Social History Narrative   ** Merged History Encounter **     ROS ALL NEGATIVE EXCEPT THOSE NOTED IN HPI  PE  General Appearance: well developed, well nourished in no acute distress, overweight HEENT: symmetrical face, PERRLA, good dentition  Neck: no JVD, thyromegaly, or adenopathy, trachea midline Chest: symmetric without deformity Cardiac: PMI non-displaced, irregular rate and rhythm, normal S1, S2, no gallop or murmur Lung: clear to ausculation and percussion Vascular: all pulses full without bruits  Abdominal: nondistended, nontender, good bowel sounds, no HSM, no bruits Extremities: no cyanosis, clubbing or edema, no sign of DVT, no varicosities  Skin: normal color, no rashes Neuro: alert and oriented x 3, non-focal Pysch: normal affect  EKG  BMET    Component Value Date/Time   NA 137 05/18/2011 1840   K 3.9 05/18/2011 1840   CL 99  05/18/2011 1840   CO2 27 05/18/2011 1840   GLUCOSE 101* 05/18/2011 1840   BUN 13 05/18/2011 1840   CREATININE 1.16 05/18/2011 1840   CALCIUM 9.2 05/18/2011 1840   GFRNONAA 61* 05/18/2011 1840   GFRAA 71* 05/18/2011 1840    Lipid Panel  No results found for this basename: chol, trig, hdl, cholhdl, vldl, ldlcalc    CBC    Component Value Date/Time   WBC 6.1 05/18/2011 1840   RBC 4.68 05/18/2011 1840   HGB 13.9 05/18/2011 1840   HCT 41.1 05/18/2011 1840   PLT 224 05/18/2011 1840   MCV 87.8 05/18/2011 1840   MCH 29.7 05/18/2011 1840   MCHC 33.8 05/18/2011 1840   RDW 13.3 05/18/2011 1840   LYMPHSABS 1.0 05/18/2011 1840   MONOABS 0.7 05/18/2011 1840   EOSABS 0.2 05/18/2011 1840   BASOSABS 0.0 05/18/2011 1840

## 2011-08-17 NOTE — Assessment & Plan Note (Signed)
I suspect this is mostly chronic now. Continue with rate control and anticoagulation. Return the office in one year.

## 2011-09-12 ENCOUNTER — Encounter: Payer: Medicare PPO | Admitting: Internal Medicine

## 2011-09-26 ENCOUNTER — Encounter: Payer: Medicare PPO | Admitting: Internal Medicine

## 2011-10-06 ENCOUNTER — Other Ambulatory Visit: Payer: Self-pay | Admitting: Urology

## 2011-10-06 DIAGNOSIS — C649 Malignant neoplasm of unspecified kidney, except renal pelvis: Secondary | ICD-10-CM

## 2011-10-07 ENCOUNTER — Ambulatory Visit
Admission: RE | Admit: 2011-10-07 | Discharge: 2011-10-07 | Disposition: A | Discharge status: Home / Self Care | Payer: Medicare PPO | Source: Ambulatory Visit | Attending: Urology | Admitting: Urology

## 2011-10-07 DIAGNOSIS — C649 Malignant neoplasm of unspecified kidney, except renal pelvis: Secondary | ICD-10-CM

## 2011-10-07 MED ORDER — IOHEXOL 300 MG/ML  SOLN
100.0000 mL | Freq: Once | INTRAMUSCULAR | Status: AC | PRN
  Administered 2011-10-07: 100 mL via INTRAVENOUS

## 2011-10-20 ENCOUNTER — Other Ambulatory Visit: Payer: Self-pay | Admitting: Cardiology

## 2011-10-20 ENCOUNTER — Other Ambulatory Visit: Payer: Self-pay

## 2011-10-20 MED ORDER — BECLOMETHASONE DIPROPIONATE 80 MCG/ACT IN AERS: 1.0000 | INHALATION_SPRAY | Freq: Two times a day (BID) | RESPIRATORY_TRACT | Status: DC

## 2011-11-07 ENCOUNTER — Ambulatory Visit (INDEPENDENT_AMBULATORY_CARE_PROVIDER_SITE_OTHER): Payer: Medicare PPO | Admitting: Internal Medicine

## 2011-11-07 ENCOUNTER — Encounter: Payer: Self-pay | Admitting: Internal Medicine

## 2011-11-07 VITALS — BP 118/76 | HR 68 | Temp 98.0°F | Resp 16 | Ht 70.0 in | Wt 180.0 lb

## 2011-11-07 DIAGNOSIS — Z79899 Other long term (current) drug therapy: Secondary | ICD-10-CM

## 2011-11-07 DIAGNOSIS — J449 Chronic obstructive pulmonary disease, unspecified: Secondary | ICD-10-CM

## 2011-11-07 DIAGNOSIS — Z7189 Other specified counseling: Secondary | ICD-10-CM

## 2011-11-07 DIAGNOSIS — N400 Enlarged prostate without lower urinary tract symptoms: Secondary | ICD-10-CM

## 2011-11-07 DIAGNOSIS — I4891 Unspecified atrial fibrillation: Secondary | ICD-10-CM

## 2011-11-07 DIAGNOSIS — G4700 Insomnia, unspecified: Secondary | ICD-10-CM

## 2011-11-07 DIAGNOSIS — Z789 Other specified health status: Secondary | ICD-10-CM

## 2011-11-07 DIAGNOSIS — B356 Tinea cruris: Secondary | ICD-10-CM

## 2011-11-07 DIAGNOSIS — Z0000 Encounter for general adult medical examination without abnormal findings: Secondary | ICD-10-CM

## 2011-11-07 DIAGNOSIS — J301 Allergic rhinitis due to pollen: Secondary | ICD-10-CM

## 2011-11-07 LAB — POCT URINALYSIS DIPSTICK
Bilirubin, UA: NEGATIVE
Blood, UA: NEGATIVE
Glucose, UA: NEGATIVE
Ketones, UA: 15
Leukocytes, UA: NEGATIVE
Nitrite, UA: NEGATIVE
Protein, UA: NEGATIVE
Spec Grav, UA: 1.025
Urobilinogen, UA: 0.2
pH, UA: 6

## 2011-11-07 LAB — POCT UA - MICROSCOPIC ONLY
Casts, Ur, LPF, POC: NEGATIVE
Crystals, Ur, HPF, POC: NEGATIVE
RBC, urine, microscopic: NEGATIVE
Yeast, UA: NEGATIVE

## 2011-11-07 MED ORDER — BECLOMETHASONE DIPROPIONATE 80 MCG/ACT IN AERS: 1.0000 | INHALATION_SPRAY | Freq: Two times a day (BID) | RESPIRATORY_TRACT | Status: DC

## 2011-11-07 MED ORDER — ZOLPIDEM TARTRATE 10 MG PO TABS: 10.0000 mg | ORAL_TABLET | Freq: Every evening | ORAL | Status: DC | PRN

## 2011-11-07 MED ORDER — KETOCONAZOLE 2 % EX CREA: TOPICAL_CREAM | Freq: Two times a day (BID) | CUTANEOUS | Status: DC

## 2011-11-07 MED ORDER — FLUTICASONE PROPIONATE 50 MCG/ACT NA SUSP: 2.0000 | NASAL | Status: DC

## 2011-11-07 NOTE — Patient Instructions (Addendum)

## 2011-11-07 NOTE — Progress Notes (Signed)
  Subjective:    Patient ID: [name], male    DOB: 1940/03/04, 71 y.o.   MRN: [id number]  HPI Doing well Needs cpe All issues controlled, see visit list See scanned hx   Review of Systems See scanned ros    Objective:   Physical Exam  Constitutional: He is oriented to person, place, and time. He appears well-developed and well-nourished.  HENT:  Right Ear: External ear normal.  Left Ear: External ear normal.  Nose: Nose normal.  Mouth/Throat: Oropharynx is clear and moist.  Eyes: EOM are normal. Pupils are equal, round, and reactive to light.  Neck: Normal range of motion. No thyromegaly present.  Cardiovascular: Normal rate, S1 normal, S2 normal and intact distal pulses.  An irregularly irregular rhythm present. Exam reveals no gallop.   No murmur heard. Pulmonary/Chest: Effort normal and breath sounds normal.  Abdominal: Soft. Bowel sounds are normal. He exhibits no mass. There is no tenderness.  Genitourinary: Penis normal.  Musculoskeletal: Normal range of motion. He exhibits no edema and no tenderness.  Lymphadenopathy:    He has no cervical adenopathy.  Neurological: He is alert and oriented to person, place, and time. He has normal reflexes. No cranial nerve deficit. He exhibits normal muscle tone. Coordination normal.  Skin: Skin is warm and dry.  Psychiatric: He has a normal mood and affect. His behavior is normal. Judgment and thought content normal.          Assessment & Plan:  See cardiology, urology, opthalmology Meds rf 1 year

## 2011-11-08 ENCOUNTER — Encounter: Payer: Self-pay | Admitting: Radiology

## 2011-11-08 LAB — COMPREHENSIVE METABOLIC PANEL
ALT: 10 U/L (ref 0–53)
AST: 14 U/L (ref 0–37)
Albumin: 4.3 g/dL (ref 3.5–5.2)
Alkaline Phosphatase: 68 U/L (ref 39–117)
Calcium: 9.6 mg/dL (ref 8.4–10.5)
Chloride: 102 mEq/L (ref 96–112)
Potassium: 4 mEq/L (ref 3.5–5.3)

## 2011-11-08 LAB — CBC
Platelets: 266 10*3/uL (ref 150–400)
RDW: 14.4 % (ref 11.5–15.5)
WBC: 5.2 10*3/uL (ref 4.0–10.5)

## 2011-11-22 ENCOUNTER — Other Ambulatory Visit: Payer: Self-pay | Admitting: Internal Medicine

## 2012-01-10 ENCOUNTER — Ambulatory Visit (INDEPENDENT_AMBULATORY_CARE_PROVIDER_SITE_OTHER): Payer: Medicare PPO | Admitting: Pulmonary Disease

## 2012-01-10 ENCOUNTER — Encounter: Payer: Self-pay | Admitting: Pulmonary Disease

## 2012-01-10 VITALS — BP 118/80 | HR 70 | Temp 97.5°F | Ht 70.0 in | Wt 185.0 lb

## 2012-01-10 DIAGNOSIS — G4733 Obstructive sleep apnea (adult) (pediatric): Secondary | ICD-10-CM

## 2012-01-10 DIAGNOSIS — R05 Cough: Secondary | ICD-10-CM

## 2012-01-10 DIAGNOSIS — Z23 Encounter for immunization: Secondary | ICD-10-CM

## 2012-01-10 DIAGNOSIS — J310 Chronic rhinitis: Secondary | ICD-10-CM

## 2012-01-10 NOTE — Assessment & Plan Note (Signed)
Related to COPD with asthma, GERD, and post-nasal drip.  Improved.

## 2012-01-10 NOTE — Assessment & Plan Note (Signed)
He has an oral appliance.  He reports that his recent sleep study with Dr. Myrtis Ser while using oral appliance showed that his sleep apnea was worse.  The patient reports confusion about how he was supposed to use home sleep test equipment.  I am not sure the test results are reliable.  Will arrange for sleep study in lab while using oral appliance.  If his sleep apnea is not controlled with oral appliance, then he will need to reconsider using CPAP.

## 2012-01-10 NOTE — Progress Notes (Signed)
Chief Complaint  Patient presents with  . Follow-up    for OSA. Still using Oral Compliance. Patient states his dentist told him that according to study done in June his OSA is now a lot worse with the oral compliance than before. Patient now using every other day.  Would like  flu vaccine.     History of Present Illness: [name] is a 71 y.o. male with COPD with asthma, rhinitis and OSA.  His cough has improved.  He continues to use singulair, Qvar and flonase on a daily basis.  He needs his flu shot.  He had follow up home sleep study with Dr. Myrtis Ser with oral appliance.  He was confused about how the home sleep study set up worked, and does not think he set it up correctly.  His sleep apnea number was apparently much worse with oral appliance.  Tests: PSG 08/05/10>>AHI 24.2, SpO2 low 77%, PLMI 15.8. PFT 07/26/11>>FEV1 2.62(91%), FEV1% 66, TLC 6.55(101%), DLCO 94%, +BD  Past Medical History  Diagnosis Date  . Atrial fibrillation   . Barrett's esophagus   . Paroxysmal atrial fibrillation     Hospitalized on 05/27/10  . GERD (gastroesophageal reflux disease)   . BPH (benign prostatic hyperplasia)   . Barrett esophagus   . Hiatal hernia   . Renal cell carcinoma 2009    right renal mass, followed by Dr. Teodora Medici  . OSA (obstructive sleep apnea) 08/05/10 PSG    AHI 24  . Chronic obstructive asthma   . Difficult intubation   . Shortness of breath     Past Surgical History  Procedure Date  . Exploratory laparotomy, segmental small bowel resection of a hemangioma, repair of incarcerated umbelical hernia 12/1999  . Exploratory laparotomy w/ bowel resection 12/1999    segmental sbr of a hemangioma, repair of incarcerated umbilical hernia  . Renal cancer surgery with rf ablation   . Tonsillectomy   . Basal cell carcinoma excision     Current Outpatient Prescriptions on File Prior to Visit  Medication Sig Dispense Refill  . beclomethasone (QVAR) 80 MCG/ACT inhaler Inhale  1 puff into the lungs 2 (two) times daily.  1 Inhaler  12  . cetirizine (ZYRTEC) 10 MG tablet Take 10 mg by mouth daily.      Marland Kitchen diltiazem (CARDIZEM CD) 180 MG 24 hr capsule Take 1 capsule (180 mg total) by mouth daily.  30 capsule  11  . doxazosin (CARDURA) 4 MG tablet Take 2 mg by mouth at bedtime.       . dutasteride (AVODART) 0.5 MG capsule Take 0.5 mg by mouth daily.        . fluticasone (FLONASE) 50 MCG/ACT nasal spray Place 2 sprays into the nose as directed.  16 g  12  . ketoconazole (NIZORAL) 2 % cream Apply topically 2 (two) times daily.  60 g  3  . metoprolol succinate (TOPROL-XL) 50 MG 24 hr tablet Take 1 tablet (50 mg total) by mouth daily. Take with or immediately following a meal.  30 tablet  11  . montelukast (SINGULAIR) 10 MG tablet TAKE 1 TABLET BY MOUTH AT BEDTIME FOR ALLERGY, BREATHING  30 tablet  10  . Multiple Vitamin (MULTIVITAMIN) capsule Take 1 capsule by mouth daily.        Marland Kitchen omeprazole (PRILOSEC) 20 MG capsule Take 20 mg by mouth daily.      Marland Kitchen PRADAXA 150 MG CAPS TAKE 1 CAPSULE BY MOUTH EVERY 12 HOURS  60 capsule  0  . PRADAXA 150 MG CAPS TAKE 1 CAPSULE BY MOUTH EVERY 12 HOURS  60 capsule  5  . Tamsulosin HCl (FLOMAX) 0.4 MG CAPS Take 0.4 mg by mouth daily.       Marland Kitchen zolpidem (AMBIEN) 10 MG tablet Take 1 tablet (10 mg total) by mouth at bedtime as needed. For anxiety/insomnia  90 tablet  3    Allergies  Allergen Reactions  . Proair Hfa (ZOX:WRUEAVWUJ)     Increased heart rate    Physical Exam: Filed Vitals:   01/10/12 1717  BP: 118/80  Pulse: 70  Temp: 97.5 F (36.4 C)  TempSrc: Oral  Height: 5\' 10"  (1.778 m)  Weight: 185 lb (83.915 kg)  SpO2: 97%  ,  Current Encounter SPO2  01/10/12 1717 97%  11/07/11 1330 100%  08/17/11 1213 98%    Wt Readings from Last 3 Encounters:  01/10/12 185 lb (83.915 kg)  11/07/11 180 lb (81.647 kg)  08/17/11 197 lb (89.359 kg)    Body mass index is 26.54 kg/(m^2).   General - No distress ENT - No sinus tenderness,  no oral exudate, no LAN Cardiac - s1s2 regular, no murmur Chest - No wheeze/rales/dullness, good air entry, normal respiratory excursion Back - No focal tenderness Abd - Soft, non-tender, no organomegaly Ext - No edema Neuro - Normal strength Skin - No rashes Psych - Normal mood, and behavior   Assessment/Plan:  Coralyn Helling, MD [facility]/Sleep Pager:  [phone] 01/10/2012, 5:26 PM

## 2012-01-10 NOTE — Assessment & Plan Note (Signed)
Improved.  Advised he could gradual reduce his regimen as tolerated.  He should stop singulair first, and if okay he could then try to cut down his use of Qvar.

## 2012-01-10 NOTE — Patient Instructions (Signed)
Flu shot today Will schedule sleep study and call with results Can try stopping singulair>>if okay after two weeks, then can try stopping flonase>>if okay after two weeks, then try cutting down on Qvar Follow up in 6 months

## 2012-01-10 NOTE — Assessment & Plan Note (Signed)
If he is stable off of singulair, then he can try changing flonase to prn and monitor his symptoms.

## 2012-01-18 ENCOUNTER — Other Ambulatory Visit: Payer: Self-pay

## 2012-01-18 MED ORDER — OMEPRAZOLE 20 MG PO CPDR: 20.0000 mg | DELAYED_RELEASE_CAPSULE | Freq: Every day | ORAL | Status: DC

## 2012-02-07 ENCOUNTER — Ambulatory Visit (HOSPITAL_BASED_OUTPATIENT_CLINIC_OR_DEPARTMENT_OTHER): Payer: Medicare PPO | Attending: Pulmonary Disease

## 2012-02-07 DIAGNOSIS — G4733 Obstructive sleep apnea (adult) (pediatric): Secondary | ICD-10-CM | POA: Insufficient documentation

## 2012-02-16 DIAGNOSIS — G4733 Obstructive sleep apnea (adult) (pediatric): Secondary | ICD-10-CM

## 2012-02-19 ENCOUNTER — Telehealth: Payer: Self-pay | Admitting: Pulmonary Disease

## 2012-02-19 NOTE — Procedures (Signed)
NAMEDEWAINE, MOROCHO NO.:  [id number]  MEDICAL RECORD NO.:  [id number]          PATIENT TYPE:  OUT  LOCATION:  SLEEP CENTER                 FACILITY:  [facility]  PHYSICIAN:  Coralyn Helling, MD        DATE OF BIRTH:  May 03, 1940  DATE OF STUDY:  02/07/2012                           NOCTURNAL POLYSOMNOGRAM  REFERRING PHYSICIAN:  Coralyn Helling, MD  FACILITY:  [facility].  REFERRING PHYSICIAN:  Coralyn Helling, MD  INDICATION:  Mr. Torbeck is a 71 year old male who has a history of atrial fibrillation, COPD with asthma and obstructive sleep apnea.  He was originally diagnosed with sleep apnea after a sleep study on August 05, 2010 with an apnea/hypopnea index of 24.2 and oxygen saturation nadir of 77%.  He was fitted for a mandibular advancement device and treated for obstructive sleep apnea.  He continues to have difficulties with sleep disruption and difficulty tolerating his oral appliance.  He is referred to the sleep lab to assess efficacy of his oral appliance and treated for obstructive sleep apnea.  Height is 5 feet 10 inches, weight is 182 pounds, BMI is 26, neck size is 16 inches.  MEDICATIONS:  Ambien, Flomax, diltiazem, Singulair, cetirizine, tramadol, Flonase, Cardura, Toprol-XL, and Prilosec.  EPWORTH SLEEPINESS SCORE:  3.  SLEEP ARCHITECTURE:  Total recording time was 354 minutes.  Total sleep time was 180 minutes.  Sleep efficiency was 50%.  Sleep latency was 7.5 minutes.  REM latency was 83 minutes.  The patient was observed in all stages of sleep.  He slept exclusively in the supine position.  RESPIRATORY DATA:  The average respiratory rate was 14.  Mild snoring was noted by the technician.  The overall apnea/hypopnea index was 11.5. There were 15 central apneic events.  The remainder of the events were obstructive in nature.  He did have a significant REM and positional effect to his sleep-disordered breathing.  OXYGEN DATA:  The baseline  oxygenation was 98%.  The oxygen saturation nadir was 78%.  The study was conducted without the use of supplemental oxygen.  CARDIAC DATA:  The average heart rate is 80.  The rhythm strip showed atrial fibrillation with occasional PVCs.  MOVEMENT/PARASOMNIA:  The periodic limb movement index was 0.  The patient had 7 restroom trips.  IMPRESSION:  This study showed continued sleep-disordered breathing even while using his mandibular advancement device.  Options at this point would be to have him refer back to dentistry to assess and fit for his oral appliance.  Alternatively, he could consider CPAP therapy again or surgical intervention.     Coralyn Helling, MD Diplomat, American Board of Sleep Medicine    VS/MEDQ  D:  02/19/2012 10:45:54  T:  02/19/2012 12:55:30  Job:  [id number]

## 2012-02-19 NOTE — Telephone Encounter (Addendum)
PSG with oral appliance 02/07/12>>AHI 11.5, SpO2 low 78%.  Both obstructive and central events.  He will need to either have refit of oral appliance, reconsider CPAP therapy, and arrange for surgical evaluation.  Left message informing pt that he still has sleep apnea even while using oral appliance.  Asked him to call back to discuss plan.  Will have my nurse follow up with pt to confirm receipt of message.

## 2012-02-21 NOTE — Telephone Encounter (Signed)
LMTCB X1 FOR PT.  

## 2012-02-27 NOTE — Telephone Encounter (Signed)
I spoke with the pt and advised of results and options. Pt wants to think about his options and call back and let us know what he wants to do. Carron Curie, CMA

## 2012-04-17 ENCOUNTER — Other Ambulatory Visit: Payer: Self-pay | Admitting: *Deleted

## 2012-04-17 MED ORDER — DABIGATRAN ETEXILATE MESYLATE 150 MG PO CAPS: 150.0000 mg | ORAL_CAPSULE | Freq: Two times a day (BID) | ORAL | Status: DC

## 2012-04-23 ENCOUNTER — Other Ambulatory Visit: Payer: Self-pay

## 2012-04-23 MED ORDER — OMEPRAZOLE 20 MG PO CPDR: 20.0000 mg | DELAYED_RELEASE_CAPSULE | Freq: Every day | ORAL | Status: DC

## 2012-04-24 ENCOUNTER — Other Ambulatory Visit: Payer: Self-pay | Admitting: Internal Medicine

## 2012-05-15 ENCOUNTER — Other Ambulatory Visit: Payer: Self-pay | Admitting: *Deleted

## 2012-05-15 MED ORDER — DILTIAZEM HCL ER COATED BEADS 180 MG PO CP24: 180.0000 mg | ORAL_CAPSULE | Freq: Every day | ORAL | Status: DC

## 2012-05-15 MED ORDER — METOPROLOL SUCCINATE ER 50 MG PO TB24: 50.0000 mg | ORAL_TABLET | Freq: Every day | ORAL | Status: DC

## 2012-05-31 ENCOUNTER — Telehealth: Payer: Self-pay | Admitting: Cardiology

## 2012-06-06 ENCOUNTER — Encounter: Payer: Self-pay | Admitting: Gastroenterology

## 2012-06-06 ENCOUNTER — Ambulatory Visit (INDEPENDENT_AMBULATORY_CARE_PROVIDER_SITE_OTHER): Payer: Medicare PPO | Admitting: Gastroenterology

## 2012-06-06 VITALS — BP 124/82 | HR 72 | Ht 70.0 in | Wt 186.0 lb

## 2012-06-06 DIAGNOSIS — K219 Gastro-esophageal reflux disease without esophagitis: Secondary | ICD-10-CM

## 2012-06-06 DIAGNOSIS — K2270 Barrett's esophagus without dysplasia: Secondary | ICD-10-CM

## 2012-06-06 MED ORDER — OMEPRAZOLE 20 MG PO CPDR: 20.0000 mg | DELAYED_RELEASE_CAPSULE | Freq: Every day | ORAL | Status: DC

## 2012-06-06 NOTE — Patient Instructions (Addendum)
We have sent the following medications to your pharmacy for you to pick up at your convenience: Omeprazole.  You will be due for a recall Endoscopy in 07/2014. We will send you a reminder in the mail when it gets closer to that time.  Thank you for choosing me and [facility].  Venita Lick. Pleas Koch., MD., Clementeen Graham

## 2012-06-06 NOTE — Progress Notes (Signed)
History of Present Illness: This is a 71 year old male with GERD and a short segment of Barrett's esophagus. Last endoscopy was in June 2011 showing 2 cm of Barrett's without dysplasia. He's been out of omeprazole for approximately one week and notes a very mild reflux symptoms. In general he is completely asymptomatic on omeprazole. He has no other gastrointestinal complaints. He has a history of difficult intubation. He is maintained on Pradaxa for afib.  Current Medications, Allergies, Past Medical History, Past Surgical History, Family History and Social History were reviewed in [employer] record.  Physical Exam: General: Well developed , well nourished, no acute distress Head: Normocephalic and atraumatic Eyes:  sclerae anicteric, EOMI Ears: Normal auditory acuity Mouth: No deformity or lesions Lungs: Clear throughout to auscultation Heart: Regular rate and rhythm; no murmurs, rubs or bruits Abdomen: Soft, non tender and non distended. No masses, hepatosplenomegaly or hernias noted. Normal Bowel sounds Musculoskeletal: Symmetrical with no gross deformities  Pulses:  Normal pulses noted Extremities: No clubbing, cyanosis, edema or deformities noted Neurological: Alert oriented x 4, grossly nonfocal Psychological:  Alert and cooperative. Normal mood and affect  Assessment and Recommendations:  1. Barrett's esophagus. Continue omeprazole 20 mg daily. We'll change his surveillance interval to a full 5 years given his short segment of Barrett's and his history of difficult intubation and Pradaxa anticoagulation.  2. Colorectal cancer screening, average risk. Colonoscopy due in June 2021.

## 2012-07-09 ENCOUNTER — Ambulatory Visit (INDEPENDENT_AMBULATORY_CARE_PROVIDER_SITE_OTHER): Payer: Medicare PPO | Admitting: Family Medicine

## 2012-07-09 ENCOUNTER — Ambulatory Visit: Payer: Medicare PPO

## 2012-07-09 DIAGNOSIS — M2041 Other hammer toe(s) (acquired), right foot: Secondary | ICD-10-CM

## 2012-07-09 DIAGNOSIS — M79609 Pain in unspecified limb: Secondary | ICD-10-CM

## 2012-07-09 DIAGNOSIS — M2040 Other hammer toe(s) (acquired), unspecified foot: Secondary | ICD-10-CM

## 2012-07-09 NOTE — Patient Instructions (Addendum)
Wear comfortable shoes. Avoid excessive weightbearing. Return if not improving over the next couple of weeks, sooner if worse

## 2012-07-09 NOTE — Progress Notes (Signed)
Subjective: Patient has been hurting in his right foot for 2 or 3 weeks. There is a tender area in the distal mid foot. No specific injury. He has developed hammertoes, and has a little difficulty wearing some shoes.  Objective: Pedal pulses are good. He has some thickening of his toenails and dry skin of his feet. He is tender over the distal two third area of his third metatarsal, possibly fourth. No discoloration of the toes. Has a hammertoe of the second toe.  Assessment: Foot pain Hammertoe  Plan: X-ray foot  [facility] reading (PRIMARY) by  Dr. Alwyn Ren Normal metatarsals.  No stress fracture noted..  Foot pain, etiology unclear. If pain continues to persist over a couple of weeks more, would need to be rex-rayed and/or MRI.

## 2012-09-06 ENCOUNTER — Encounter: Payer: Self-pay | Admitting: Cardiology

## 2012-09-06 ENCOUNTER — Ambulatory Visit (INDEPENDENT_AMBULATORY_CARE_PROVIDER_SITE_OTHER): Payer: Medicare PPO | Admitting: Cardiology

## 2012-09-06 VITALS — BP 124/72 | HR 78 | Ht 70.0 in | Wt 178.0 lb

## 2012-09-06 DIAGNOSIS — G4733 Obstructive sleep apnea (adult) (pediatric): Secondary | ICD-10-CM

## 2012-09-06 DIAGNOSIS — I4891 Unspecified atrial fibrillation: Secondary | ICD-10-CM

## 2012-09-06 DIAGNOSIS — J449 Chronic obstructive pulmonary disease, unspecified: Secondary | ICD-10-CM

## 2012-09-06 NOTE — Patient Instructions (Addendum)
Your physician recommends that you continue on your current medications as directed. Please refer to the Current Medication list given to you today.  Your physician wants you to follow-up in:  1 year with Dr. Peter Swaziland.  You will receive a reminder letter in the mail two months in advance. If you don't receive a letter, please call our office to schedule the follow-up appointment.

## 2012-09-06 NOTE — Progress Notes (Signed)
HPI [name] returns today for evaluation and management as chronic A. fib. He is rate controlled and on anticoagulation. He has done well on this regimen. He denies any palpitations, presyncope or syncope or chest pain. He denies any bleeding or melena.  Past Medical History  Diagnosis Date  . Atrial fibrillation   . Barrett's esophagus   . Paroxysmal atrial fibrillation     Hospitalized on 05/27/10  . GERD (gastroesophageal reflux disease)   . BPH (benign prostatic hyperplasia)   . Barrett esophagus   . Hiatal hernia   . Renal cell carcinoma 2009    right renal mass, followed by Dr. Teodora Medici  . OSA (obstructive sleep apnea) 08/05/10 PSG    AHI 24  . Chronic obstructive asthma   . Difficult intubation   . Shortness of breath     Current Outpatient Prescriptions  Medication Sig Dispense Refill  . cetirizine (ZYRTEC) 10 MG tablet TAKE 1 TABLET BY MOUTH EVERY NIGHT AT BEDTIME  30 tablet  11  . dabigatran (PRADAXA) 150 MG CAPS Take 1 capsule (150 mg total) by mouth every 12 (twelve) hours.  60 capsule  5  . diltiazem (CARDIZEM CD) 180 MG 24 hr capsule Take 1 capsule (180 mg total) by mouth daily.  30 capsule  5  . dutasteride (AVODART) 0.5 MG capsule Take 0.5 mg by mouth daily.        . fluticasone (FLONASE) 50 MCG/ACT nasal spray Place 2 sprays into the nose as directed.  16 g  12  . metoprolol succinate (TOPROL-XL) 50 MG 24 hr tablet Take 1 tablet (50 mg total) by mouth daily. Take with or immediately following a meal.  30 tablet  5  . montelukast (SINGULAIR) 10 MG tablet TAKE 1 TABLET BY MOUTH AT BEDTIME FOR ALLERGY, BREATHING  30 tablet  10  . Multiple Vitamin (MULTIVITAMIN) capsule Take 1 capsule by mouth daily.        Marland Kitchen omeprazole (PRILOSEC) 20 MG capsule Take 1 capsule (20 mg total) by mouth daily.  30 capsule  11  . Tamsulosin HCl (FLOMAX) 0.4 MG CAPS Take 0.4 mg by mouth daily.       Marland Kitchen zolpidem (AMBIEN) 10 MG tablet Take 1 tablet (10 mg total) by mouth at bedtime as needed. For  anxiety/insomnia  90 tablet  3   No current facility-administered medications for this visit.    Allergies  Allergen Reactions  . Proair Hfa (ZOX:WRUEAVWUJ)     Increased heart rate    Family History  Problem Relation Age of Onset  . Stroke Mother   . Obesity Brother   . Asthma Paternal Grandmother   . Heart attack Paternal Grandfather   . Kidney cancer Paternal Aunt   . Colon cancer Neg Hx     History   Social History  . Marital Status: Single    Spouse Name: N/A    Number of Children: N/A  . Years of Education: N/A   Occupational History  . tv producer/construction   . televison Photographer    Social History Main Topics  . Smoking status: Former Smoker -- 10 years    Types: Cigars  . Smokeless tobacco: Never Used     Comment: quit when he was 72 years old  . Alcohol Use: No  . Drug Use: No  . Sexually Active: Not Currently   Other Topics Concern  . Not on file   Social History Narrative   ** Merged History Encounter **  ROS ALL NEGATIVE EXCEPT THOSE NOTED IN HPI  PE  General Appearance: well developed, well nourished in no acute distress HEENT: symmetrical face, PERRLA, good dentition  Neck: no JVD, thyromegaly, or adenopathy, trachea midline Chest: symmetric without deformity Cardiac: PMI non-displaced, irregular rate and rhythm, normal S1, S2, no gallop or murmur Lung: clear to ausculation and percussion Vascular: all pulses full without bruits  Abdominal: nondistended, nontender, good bowel sounds,  no bruits Extremities: no cyanosis, clubbing or edema, no sign of DVT, superficial varicosities  Skin: normal color, no rashes Neuro: alert and oriented x 3, non-focal Pysch: normal affect  EKG Chronic A. fib, rate 70-80, right axis deviation, no acute changes BMET    Component Value Date/Time   NA 140 11/07/2011 1429   K 4.0 11/07/2011 1429   CL 102 11/07/2011 1429   CO2 30 11/07/2011 1429   GLUCOSE 73 11/07/2011 1429   BUN 11 11/07/2011 1429    CREATININE 1.14 11/07/2011 1429   CREATININE 1.16 05/18/2011 1840   CALCIUM 9.6 11/07/2011 1429   GFRNONAA 61* 05/18/2011 1840   GFRAA 71* 05/18/2011 1840    Lipid Panel  No results found for this basename: chol, trig, hdl, cholhdl, vldl, ldlcalc    CBC    Component Value Date/Time   WBC 5.2 11/07/2011 1429   RBC 4.83 11/07/2011 1429   HGB 14.5 11/07/2011 1429   HCT 42.9 11/07/2011 1429   PLT 266 11/07/2011 1429   MCV 88.8 11/07/2011 1429   MCH 30.0 11/07/2011 1429   MCHC 33.8 11/07/2011 1429   RDW 14.4 11/07/2011 1429   LYMPHSABS 1.0 05/18/2011 1840   MONOABS 0.7 05/18/2011 1840   EOSABS 0.2 05/18/2011 1840   BASOSABS 0.0 05/18/2011 1840

## 2012-09-06 NOTE — Assessment & Plan Note (Signed)
Doing well with rate control and anticoagulation. Followup with Dr. Swaziland in one year.

## 2012-09-19 ENCOUNTER — Ambulatory Visit (INDEPENDENT_AMBULATORY_CARE_PROVIDER_SITE_OTHER): Payer: Medicare PPO | Admitting: Pulmonary Disease

## 2012-09-19 ENCOUNTER — Encounter: Payer: Self-pay | Admitting: Pulmonary Disease

## 2012-09-19 VITALS — BP 112/80 | HR 69 | Temp 98.3°F | Ht 70.0 in | Wt 185.0 lb

## 2012-09-19 DIAGNOSIS — G4733 Obstructive sleep apnea (adult) (pediatric): Secondary | ICD-10-CM

## 2012-09-19 DIAGNOSIS — J310 Chronic rhinitis: Secondary | ICD-10-CM

## 2012-09-19 DIAGNOSIS — J449 Chronic obstructive pulmonary disease, unspecified: Secondary | ICD-10-CM

## 2012-09-19 NOTE — Assessment & Plan Note (Signed)
He was not able to tolerate oral appliance.  I explained how sleep apnea can affect his a fib.  He is willing to try CPAP.  Will arrange for auto CPAP set up.

## 2012-09-19 NOTE — Patient Instructions (Signed)
Try stopping claritin first >> if okay after two weeks, then stop montelukast >> if okay after two weeks, then stop fluticasone nose spray Continue qvar Will arrange for CPAP set up  Follow up in 2 months

## 2012-09-19 NOTE — Assessment & Plan Note (Signed)
Stable.  Will try to reduce his regimen.  Will have his stop singulair.  He is to continue qvar.

## 2012-09-19 NOTE — Progress Notes (Signed)
Chief Complaint  Patient presents with  . Sleep Apnea    Has not used his oral device in about 6 months.    History of Present Illness: [name] is a 72 y.o. male with COPD with asthma, rhinitis and OSA.  He is not having cough, wheeze, sputum, chest discomfort, or sinus congestion.  He has not used his oral appliance for the past 6 months.  He is sleeping okay, but still has to take medication for his a fib.  He does still snore at night, and feels like his wind pipe closes off when asleep.  Tests: PSG 08/05/10>>AHI 24.2, SpO2 low 77%, PLMI 15.8. PFT 07/26/11>>FEV1 2.62(91%), FEV1% 66, TLC 6.55(101%), DLCO 94%, +BD PSG with oral appliance 02/07/12>>AHI 11.5, SpO2 low 78%. Both obstructive and central events.  He  has a past medical history of Atrial fibrillation; Barrett's esophagus; Paroxysmal atrial fibrillation; GERD (gastroesophageal reflux disease); BPH (benign prostatic hyperplasia); Barrett esophagus; Hiatal hernia; Renal cell carcinoma (2009); OSA (obstructive sleep apnea) (08/05/10 PSG); Chronic obstructive asthma; Difficult intubation; and Shortness of breath.  He  has past surgical history that includes exploratory laparotomy, segmental small bowel resection of a hemangioma, repair of incarcerated umbelical hernia (12/1999); Exploratory laparotomy w/ bowel resection (12/1999); renal cancer surgery with RF Ablation; Tonsillectomy; and Excision basal cell carcinoma.   Current Outpatient Prescriptions on File Prior to Visit  Medication Sig Dispense Refill  . cetirizine (ZYRTEC) 10 MG tablet TAKE 1 TABLET BY MOUTH EVERY NIGHT AT BEDTIME  30 tablet  11  . dabigatran (PRADAXA) 150 MG CAPS Take 1 capsule (150 mg total) by mouth every 12 (twelve) hours.  60 capsule  5  . diltiazem (CARDIZEM CD) 180 MG 24 hr capsule Take 1 capsule (180 mg total) by mouth daily.  30 capsule  5  . dutasteride (AVODART) 0.5 MG capsule Take 0.5 mg by mouth daily.        . fluticasone (FLONASE)  50 MCG/ACT nasal spray Place 2 sprays into the nose as directed.  16 g  12  . metoprolol succinate (TOPROL-XL) 50 MG 24 hr tablet Take 1 tablet (50 mg total) by mouth daily. Take with or immediately following a meal.  30 tablet  5  . montelukast (SINGULAIR) 10 MG tablet TAKE 1 TABLET BY MOUTH AT BEDTIME FOR ALLERGY, BREATHING  30 tablet  10  . Multiple Vitamin (MULTIVITAMIN) capsule Take 1 capsule by mouth daily.        Marland Kitchen omeprazole (PRILOSEC) 20 MG capsule Take 1 capsule (20 mg total) by mouth daily.  30 capsule  11  . Tamsulosin HCl (FLOMAX) 0.4 MG CAPS Take 0.4 mg by mouth daily.       Marland Kitchen zolpidem (AMBIEN) 10 MG tablet Take 1 tablet (10 mg total) by mouth at bedtime as needed. For anxiety/insomnia  90 tablet  3   No current facility-administered medications on file prior to visit.    Allergies  Allergen Reactions  . Proair Hfa (ZOX:WRUEAVWUJ)     Increased heart rate    Physical Exam:  General - No distress ENT - No sinus tenderness, decreased AP diameter oropharynx, no oral exudate, no LAN Cardiac - s1s2 regular, no murmur Chest - No wheeze/rales/dullness, good air entry, normal respiratory excursion Back - No focal tenderness Abd - Soft, non-tender Ext - No edema Neuro - Normal strength Skin - No rashes Psych - Normal mood, and behavior   Assessment/Plan:  Coralyn Helling, MD [facility]Critical [facility]/Sleep Pager:  [phone] 09/19/2012,  2:34 PM

## 2012-09-19 NOTE — Assessment & Plan Note (Signed)
Stable.  Will have him switch claritin to prn.  If stable, he can then stop singulair, and then flonase.

## 2012-09-29 ENCOUNTER — Other Ambulatory Visit: Payer: Self-pay | Admitting: Cardiology

## 2012-10-11 ENCOUNTER — Other Ambulatory Visit: Payer: Self-pay | Admitting: Dermatology

## 2012-10-23 ENCOUNTER — Telehealth: Payer: Self-pay | Admitting: Pulmonary Disease

## 2012-10-23 NOTE — Telephone Encounter (Signed)
lmtcb

## 2012-10-23 NOTE — Telephone Encounter (Signed)
Auto CPAP 09/25/12 to 10/08/12 >> Used on 12 of 14 nights with average 5 hrs 7 min.  Average AHI 9.9 with median CPAP 8 cm H2O and 95 th percentile CPAP 11 cm H2O.  Will have my nurse inform pt that CPAP report shows improvement in sleep apnea with CPAP.  He is to continue current set up.  Will review status in more detail at [facility] on 11/20/12

## 2012-10-24 NOTE — Telephone Encounter (Signed)
Pt is aware of results. 

## 2012-10-29 ENCOUNTER — Other Ambulatory Visit: Payer: Self-pay | Admitting: Cardiology

## 2012-11-12 ENCOUNTER — Encounter: Payer: Medicare PPO | Admitting: Internal Medicine

## 2012-11-19 ENCOUNTER — Ambulatory Visit (INDEPENDENT_AMBULATORY_CARE_PROVIDER_SITE_OTHER): Payer: Medicare PPO | Admitting: Internal Medicine

## 2012-11-19 ENCOUNTER — Encounter: Payer: Self-pay | Admitting: Internal Medicine

## 2012-11-19 VITALS — BP 124/80 | HR 74 | Temp 97.5°F | Resp 16 | Ht 70.0 in | Wt 180.0 lb

## 2012-11-19 DIAGNOSIS — N281 Cyst of kidney, acquired: Secondary | ICD-10-CM

## 2012-11-19 DIAGNOSIS — K219 Gastro-esophageal reflux disease without esophagitis: Secondary | ICD-10-CM

## 2012-11-19 DIAGNOSIS — G4700 Insomnia, unspecified: Secondary | ICD-10-CM

## 2012-11-19 DIAGNOSIS — Z789 Other specified health status: Secondary | ICD-10-CM

## 2012-11-19 DIAGNOSIS — I4891 Unspecified atrial fibrillation: Secondary | ICD-10-CM

## 2012-11-19 DIAGNOSIS — Z125 Encounter for screening for malignant neoplasm of prostate: Secondary | ICD-10-CM

## 2012-11-19 DIAGNOSIS — Z23 Encounter for immunization: Secondary | ICD-10-CM

## 2012-11-19 DIAGNOSIS — G4733 Obstructive sleep apnea (adult) (pediatric): Secondary | ICD-10-CM

## 2012-11-19 DIAGNOSIS — Z0000 Encounter for general adult medical examination without abnormal findings: Secondary | ICD-10-CM

## 2012-11-19 DIAGNOSIS — N400 Enlarged prostate without lower urinary tract symptoms: Secondary | ICD-10-CM

## 2012-11-19 DIAGNOSIS — Q619 Cystic kidney disease, unspecified: Secondary | ICD-10-CM

## 2012-11-19 DIAGNOSIS — Z7189 Other specified counseling: Secondary | ICD-10-CM

## 2012-11-19 LAB — CBC WITH DIFFERENTIAL/PLATELET
Eosinophils Absolute: 0.2 10*3/uL (ref 0.0–0.7)
Eosinophils Relative: 4 % (ref 0–5)
HCT: 41.7 % (ref 39.0–52.0)
Lymphocytes Relative: 20 % (ref 12–46)
Lymphs Abs: 0.9 10*3/uL (ref 0.7–4.0)
MCH: 30.9 pg (ref 26.0–34.0)
MCV: 89.5 fL (ref 78.0–100.0)
Monocytes Absolute: 0.5 10*3/uL (ref 0.1–1.0)
Monocytes Relative: 10 % (ref 3–12)
RBC: 4.66 MIL/uL (ref 4.22–5.81)
WBC: 4.5 10*3/uL (ref 4.0–10.5)

## 2012-11-19 LAB — COMPREHENSIVE METABOLIC PANEL
ALT: 9 U/L (ref 0–53)
BUN: 15 mg/dL (ref 6–23)
CO2: 29 mEq/L (ref 19–32)
Chloride: 104 mEq/L (ref 96–112)
Creat: 1.13 mg/dL (ref 0.50–1.35)
Glucose, Bld: 91 mg/dL (ref 70–99)
Total Bilirubin: 0.6 mg/dL (ref 0.3–1.2)

## 2012-11-19 LAB — POCT URINALYSIS DIPSTICK
Bilirubin, UA: NEGATIVE
Glucose, UA: NEGATIVE
Nitrite, UA: NEGATIVE
Spec Grav, UA: 1.02
pH, UA: 5.5

## 2012-11-19 LAB — POCT UA - MICROSCOPIC ONLY
Bacteria, U Microscopic: NEGATIVE
Crystals, Ur, HPF, POC: NEGATIVE
Mucus, UA: NEGATIVE
WBC, Ur, HPF, POC: NEGATIVE
Yeast, UA: NEGATIVE

## 2012-11-19 LAB — LIPID PANEL
Cholesterol: 169 mg/dL (ref 0–200)
HDL: 47 mg/dL (ref >39)
LDL Cholesterol: 109 mg/dL — ABNORMAL HIGH (ref 0–99)
Triglycerides: 65 mg/dL (ref <150)

## 2012-11-19 MED ORDER — ZOLPIDEM TARTRATE 10 MG PO TABS: 10.0000 mg | ORAL_TABLET | Freq: Every evening | ORAL | Status: DC | PRN

## 2012-11-19 NOTE — Patient Instructions (Signed)

## 2012-11-19 NOTE — Progress Notes (Signed)
  Subjective:    Patient ID: [name], male    DOB: [birth year], 72 y.o.   MRN: [id number]  HPI    Review of Systems  Constitutional: Negative.   HENT: Positive for neck pain.   Eyes: Negative.   Respiratory: Positive for apnea.   Cardiovascular: Negative.   Gastrointestinal: Positive for anal bleeding.  Endocrine: Negative.   Genitourinary: Negative.   Skin: Negative.   Allergic/Immunologic: Negative.   Neurological: Negative.   Hematological: Negative.   Psychiatric/Behavioral: Positive for sleep disturbance.       Objective:   Physical Exam        Assessment & Plan:

## 2012-11-19 NOTE — Progress Notes (Signed)
  Subjective:    Patient ID: [name], male    DOB: Jun 17, 1940, 72 y.o.   MRN: [id number]  HPI Doing well. See problem list. All issues txed and responding. Pulmonology is stopping his singulair, zyrtec, and fluticasone--trial.   Review of Systems  Constitutional: Negative.   HENT: Negative.   Eyes: Negative.   Respiratory: Negative.   Cardiovascular: Positive for palpitations.  Gastrointestinal: Negative.   Endocrine: Negative.   Genitourinary: Negative.   Musculoskeletal: Positive for arthralgias.  Skin: Positive for rash.  Allergic/Immunologic: Positive for environmental allergies.  Neurological: Negative.   Hematological: Negative.   Psychiatric/Behavioral: Positive for sleep disturbance.  Darvin Neighbours closely following kidney and prostate     Objective:   Physical Exam  Vitals reviewed. Constitutional: He is oriented to person, place, and time. He appears well-developed and well-nourished. No distress.  HENT:  Head: Normocephalic.  Right Ear: External ear normal.  Left Ear: External ear normal.  Nose: Nose normal.  Mouth/Throat: Oropharynx is clear and moist.  Eyes: Conjunctivae and EOM are normal. Pupils are equal, round, and reactive to light.  Neck: Normal range of motion. Neck supple. No tracheal deviation present.  Cardiovascular: Normal rate and intact distal pulses.  An irregularly irregular rhythm present. Exam reveals no friction rub.   No murmur heard. Pulmonary/Chest: Effort normal. No respiratory distress. He has no wheezes. He has no rales. He exhibits no tenderness.  Abdominal: Soft. He exhibits no mass. There is no tenderness.  Genitourinary: Rectum normal, prostate normal and penis normal.  Musculoskeletal: He exhibits tenderness.  Lymphadenopathy:    He has cervical adenopathy.  Neurological: He is alert and oriented to person, place, and time. He has normal reflexes. No cranial nerve deficit. He exhibits normal muscle tone. Coordination normal.   Psychiatric: He has a normal mood and affect. Judgment and thought content normal.          Assessment & Plan:  CPE Insomnia/Stress OSA Afib Renal mass/cyst

## 2012-11-20 ENCOUNTER — Encounter: Payer: Self-pay | Admitting: Pulmonary Disease

## 2012-11-20 ENCOUNTER — Ambulatory Visit (INDEPENDENT_AMBULATORY_CARE_PROVIDER_SITE_OTHER): Payer: Medicare PPO | Admitting: Pulmonary Disease

## 2012-11-20 VITALS — BP 122/82 | HR 78 | Temp 98.7°F | Ht 70.0 in | Wt 185.0 lb

## 2012-11-20 DIAGNOSIS — J449 Chronic obstructive pulmonary disease, unspecified: Secondary | ICD-10-CM

## 2012-11-20 DIAGNOSIS — J310 Chronic rhinitis: Secondary | ICD-10-CM

## 2012-11-20 DIAGNOSIS — G4733 Obstructive sleep apnea (adult) (pediatric): Secondary | ICD-10-CM

## 2012-11-20 NOTE — Assessment & Plan Note (Signed)
Not an active issue at present.

## 2012-11-20 NOTE — Patient Instructions (Signed)
Follow up in 6 months 

## 2012-11-20 NOTE — Assessment & Plan Note (Signed)
He is compliant with therapy and reports benefit from CPAP.    Will continue auto CPAP settings.

## 2012-11-20 NOTE — Assessment & Plan Note (Signed)
He has been able to tolerate reduction in his asthma regimen.  Will continue Qvar.

## 2012-11-20 NOTE — Progress Notes (Signed)
   Chief Complaint  Patient presents with  . Sleep Apnea    Currently using CPAP every night. Denies any problems with the machine, mask or pressure.    History of Present Illness: [name] is a 72 y.o. male with COPD with asthma, rhinitis and OSA.  He has been doing well with CPAP.  He has nasal pillows and likes this mask type.    He is not having cough, wheeze, or sputum.  He is no longer using sinus regimen or singulair.  He gets occasional sinus congestion, but this does not need treatment.  Tests: PSG 08/05/10>>AHI 24.2, SpO2 low 77%, PLMI 15.8. PFT 07/26/11>>FEV1 2.62(91%), FEV1% 66, TLC 6.55(101%), DLCO 94%, +BD PSG with oral appliance 02/07/12>>AHI 11.5, SpO2 low 78%. Both obstructive and central events. Auto CPAP 09/24/12 to 11/19/12 >> Used on 52 of 57 nights with average 4 hrs 18 min per night.  Average AHI 8.5 (mostly centrals)  He  has a past medical history of Atrial fibrillation; Barrett's esophagus; Paroxysmal atrial fibrillation; GERD (gastroesophageal reflux disease); BPH (benign prostatic hyperplasia); Barrett esophagus; Hiatal hernia; Renal cell carcinoma (2009); OSA (obstructive sleep apnea) (08/05/10 PSG); Chronic obstructive asthma; Difficult intubation; Shortness of breath; and Allergy.  He  has past surgical history that includes exploratory laparotomy, segmental small bowel resection of a hemangioma, repair of incarcerated umbelical hernia (12/1999); Exploratory laparotomy w/ bowel resection (12/1999); renal cancer surgery with RF Ablation; Tonsillectomy; Excision basal cell carcinoma; and Eye surgery.   Current Outpatient Prescriptions on File Prior to Visit  Medication Sig Dispense Refill  . beclomethasone (QVAR) 80 MCG/ACT inhaler Inhale 1 puff into the lungs 2 (two) times daily.      Marland Kitchen CARTIA XT 180 MG 24 hr capsule TAKE 1 CAPSULE BY MOUTH DAILY  30 capsule  11  . dutasteride (AVODART) 0.5 MG capsule Take 0.5 mg by mouth daily.        . metoprolol  succinate (TOPROL-XL) 50 MG 24 hr tablet TAKE 1 TABLET BY MOUTH EVERY DAY WITH OR FOLLOWING A MEAL  30 tablet  11  . Multiple Vitamin (MULTIVITAMIN) capsule Take 1 capsule by mouth daily.        Marland Kitchen omeprazole (PRILOSEC) 20 MG capsule Take 1 capsule (20 mg total) by mouth daily.  30 capsule  11  . PRADAXA 150 MG CAPS capsule TAKE 1 CAPSULE BY MOUTH EVERY 12 HOURS  60 capsule  11  . zolpidem (AMBIEN) 10 MG tablet Take 1 tablet (10 mg total) by mouth at bedtime as needed. For anxiety/insomnia  45 tablet  3   No current facility-administered medications on file prior to visit.    Allergies  Allergen Reactions  . Proair Hfa [Kdc:Albuterol]     Increased heart rate    Physical Exam:  General - No distress ENT - No sinus tenderness, decreased AP diameter oropharynx, no oral exudate, no LAN Cardiac - s1s2 regular, no murmur Chest - No wheeze/rales/dullness, good air entry, normal respiratory excursion Back - No focal tenderness Abd - Soft, non-tender Ext - No edema Neuro - Normal strength Skin - No rashes Psych - Normal mood, and behavior   Assessment/Plan:  Coralyn Helling, MD [facility] Pager:  [phone] 11/20/2012, 3:58 PM

## 2012-11-22 ENCOUNTER — Telehealth: Payer: Self-pay | Admitting: *Deleted

## 2012-11-22 NOTE — Telephone Encounter (Signed)
lmom that [name] is not covered by ins.  If pt have questions he can call the customer service on the back of his card.

## 2012-11-28 ENCOUNTER — Other Ambulatory Visit: Payer: Self-pay | Admitting: *Deleted

## 2012-11-28 MED ORDER — BECLOMETHASONE DIPROPIONATE 80 MCG/ACT IN AERS: 1.0000 | INHALATION_SPRAY | Freq: Two times a day (BID) | RESPIRATORY_TRACT | Status: DC

## 2013-01-28 ENCOUNTER — Other Ambulatory Visit: Payer: Self-pay | Admitting: Pulmonary Disease

## 2013-03-01 NOTE — Telephone Encounter (Signed)
No other info °

## 2013-03-06 ENCOUNTER — Telehealth: Payer: Self-pay | Admitting: Pulmonary Disease

## 2013-03-06 NOTE — Telephone Encounter (Signed)
Spoke with pt. C/o prod cough (? Color), wheeziing. No fever, no sweats, no nausea, no vomiting, no body aches. Pt is scheduled to come in and see TP in the AM at 10. Nothing further needed

## 2013-03-07 ENCOUNTER — Ambulatory Visit (INDEPENDENT_AMBULATORY_CARE_PROVIDER_SITE_OTHER): Payer: Medicare PPO | Admitting: Adult Health

## 2013-03-07 ENCOUNTER — Encounter (INDEPENDENT_AMBULATORY_CARE_PROVIDER_SITE_OTHER): Payer: Self-pay

## 2013-03-07 ENCOUNTER — Encounter: Payer: Self-pay | Admitting: Adult Health

## 2013-03-07 VITALS — BP 110/60 | HR 75 | Temp 97.1°F | Ht 70.0 in | Wt 190.2 lb

## 2013-03-07 DIAGNOSIS — J449 Chronic obstructive pulmonary disease, unspecified: Secondary | ICD-10-CM

## 2013-03-07 MED ORDER — AZITHROMYCIN 250 MG PO TABS: ORAL_TABLET | ORAL | Status: AC

## 2013-03-07 MED ORDER — ALBUTEROL SULFATE HFA 108 (90 BASE) MCG/ACT IN AERS: 2.0000 | INHALATION_SPRAY | RESPIRATORY_TRACT | Status: DC | PRN

## 2013-03-07 MED ORDER — PREDNISONE 10 MG PO TABS: ORAL_TABLET | ORAL | Status: DC

## 2013-03-07 NOTE — Progress Notes (Signed)
   Subjective:    Patient ID: [name], male    DOB: 10-23-1940, 73 y.o.   MRN: [id number]  HPI 73 yo male with COPD with asthma, rhinitis and OSA.   03/07/2013 Acute OV  Complains of Prod cough (clear- yellow)for past 2-3 weeks. Complains of n asal drainage( clear) and wheezing.  Denies fever, sorethroat or SOB.  Pt would like rx for rescue inhaler.  Taking zyrtec without much relief.   Patient denies any hemoptysis, chest pain, orthopnea, PND, or leg swelling. His cough is getting worse with thick, yellow mucus. Also, been wheezing more the last 2 days.    Tests: PSG 08/05/10>>AHI 24.2, SpO2 low 77%, PLMI 15.8. PFT 07/26/11>>FEV1 2.62(91%), FEV1% 66, TLC 6.55(101%), DLCO 94%, +BD PSG with oral appliance 02/07/12>>AHI 11.5, SpO2 low 78%. Both obstructive and central events. Auto CPAP 09/24/12 to 11/19/12 >> Used on 52 of 57 nights with average 4 hrs 18 min per night.  Average AHI 8.5 (mostly centrals)    Review of Systems Constitutional:   No  weight loss, night sweats,  Fevers, chills, fatigue, or  lassitude.  HEENT:   No headaches,  Difficulty swallowing,  Tooth/dental problems, or  Sore throat,                No sneezing, itching, ear ache, + nasal congestion, post nasal drip,   CV:  No chest pain,  Orthopnea, PND, swelling in lower extremities, anasarca, dizziness, palpitations, syncope.   GI  No heartburn, indigestion, abdominal pain, nausea, vomiting, diarrhea, change in bowel habits, loss of appetite, bloody stools.   Resp:    No chest wall deformity  Skin: no rash or lesions.  GU: no dysuria, change in color of urine, no urgency or frequency.  No flank pain, no hematuria   MS:  No joint pain or swelling.  No decreased range of motion.  No back pain.  Psych:  No change in mood or affect. No depression or anxiety.  No memory loss.         Objective:   Physical Exam GEN: A/Ox3; pleasant , NAD, well nourished   HEENT:  [place]/AT,  EACs-clear, TMs-wnl,  NOSE-clear, THROAT-clear, no lesions, no postnasal drip or exudate noted.   NECK:  Supple w/ fair ROM; no JVD; normal carotid impulses w/o bruits; no thyromegaly or nodules palpated; no lymphadenopathy.  RESP  Exp wheezing, no accessory muscle use, no dullness to percussion  CARD:  RRR, no m/r/g  , no peripheral edema, pulses intact, no cyanosis or clubbing.  GI:   Soft & nt; nml bowel sounds; no organomegaly or masses detected.  Musco: Warm bil, no deformities or joint swelling noted.   Neuro: alert, no focal deficits noted.    Skin: Warm, no lesions or rashes         Assessment & Plan:

## 2013-03-07 NOTE — Progress Notes (Signed)
Reviewed and agree with assessment/plan. 

## 2013-03-07 NOTE — Patient Instructions (Signed)
Zpack take as directed.  Prednisone taper over next week.  Mucinex DM Twice daily  As needed  Cough/congestion  Fluids and rest  Please contact office for sooner follow up if symptoms do not improve or worsen or seek emergency care  follow up [name]  As planned and As needed

## 2013-03-07 NOTE — Assessment & Plan Note (Signed)
Flare   Plan  Zpack take as directed.  Prednisone taper over next week.  Mucinex DM Twice daily  As needed  Cough/congestion  Fluids and rest  Please contact office for sooner follow up if symptoms do not improve or worsen or seek emergency care  follow up [name]  As planned and As needed

## 2013-03-07 NOTE — Addendum Note (Signed)
Addended by: Parke Poisson E on: 03/07/2013 11:12 AM   Modules accepted: Orders

## 2013-04-02 ENCOUNTER — Encounter: Payer: Self-pay | Admitting: Internal Medicine

## 2013-04-02 ENCOUNTER — Ambulatory Visit (INDEPENDENT_AMBULATORY_CARE_PROVIDER_SITE_OTHER): Payer: Medicare PPO | Admitting: Internal Medicine

## 2013-04-02 VITALS — BP 108/80 | HR 87 | Temp 97.8°F | Resp 20 | Ht 71.0 in | Wt 188.0 lb

## 2013-04-02 DIAGNOSIS — S51009A Unspecified open wound of unspecified elbow, initial encounter: Secondary | ICD-10-CM

## 2013-04-02 DIAGNOSIS — S81809A Unspecified open wound, unspecified lower leg, initial encounter: Secondary | ICD-10-CM

## 2013-04-02 DIAGNOSIS — S81001A Unspecified open wound, right knee, initial encounter: Secondary | ICD-10-CM

## 2013-04-02 DIAGNOSIS — S81002A Unspecified open wound, left knee, initial encounter: Secondary | ICD-10-CM

## 2013-04-02 DIAGNOSIS — Z23 Encounter for immunization: Secondary | ICD-10-CM

## 2013-04-02 DIAGNOSIS — S91009A Unspecified open wound, unspecified ankle, initial encounter: Secondary | ICD-10-CM

## 2013-04-02 DIAGNOSIS — R52 Pain, unspecified: Secondary | ICD-10-CM

## 2013-04-02 DIAGNOSIS — S61409A Unspecified open wound of unspecified hand, initial encounter: Secondary | ICD-10-CM

## 2013-04-02 DIAGNOSIS — S81009A Unspecified open wound, unspecified knee, initial encounter: Secondary | ICD-10-CM

## 2013-04-02 MED ORDER — HYDROCODONE-ACETAMINOPHEN 5-325 MG PO TABS: 1.0000 | ORAL_TABLET | Freq: Four times a day (QID) | ORAL | Status: DC | PRN

## 2013-04-02 MED ORDER — SILVER SULFADIAZINE 1 % EX CREA: 1.0000 "application " | TOPICAL_CREAM | Freq: Two times a day (BID) | CUTANEOUS | Status: DC
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
## 2013-04-02 NOTE — Patient Instructions (Signed)
Dressing Change A dressing is a material placed over wounds. It keeps the wound clean, dry, and protected from further injury. This provides an environment that favors wound healing.  BEFORE YOU BEGIN  Get your supplies together. Things you may need include:  Saline solution.  Flexible gauze dressing.  Medicated cream.  Tape.  Gloves.  Abdominal dressing pads.  Gauze squares.  Plastic bags.  Take pain medicine 30 minutes before the dressing change if you need it.  Take a shower before you do the first dressing change of the day. Use plastic wrap or a plastic bag to prevent the dressing from getting wet. REMOVING YOUR OLD DRESSING   Wash your hands with soap and water. Dry your hands with a clean towel.  Put on your gloves.  Remove any tape.  Carefully remove the old dressing. If the dressing sticks, you may dampen it with warm water to loosen it, or follow your caregiver's specific directions.  Remove any gauze or packing tape that is in your wound.  Take off your gloves.  Put the gloves, tape, gauze, or any packing tape into a plastic bag. CHANGING YOUR DRESSING  Open the supplies.  Take the cap off the saline solution.  Open the gauze package so that the gauze remains on the inside of the package.  Put on your gloves.  Clean your wound as told by your caregiver.  If you have been told to keep your wound dry, follow those instructions.  Your caregiver may tell you to do one or more of the following:  Pick up the gauze. Pour the saline solution over the gauze. Squeeze out the extra saline solution.  Put medicated cream or other medicine on your wound if you have been told to do so.  Put the solution soaked gauze only in your wound, not on the skin around it.  Pack your wound loosely or as told by your caregiver.  Put dry gauze on your wound.  Put abdominal dressing pads over the dry gauze if your wet gauze soaks through.  Tape the abdominal dressing  pads in place so they will not fall off. Do not wrap the tape completely around the affected part (arm, leg, abdomen).  Wrap the dressing pads with a flexible gauze dressing to secure it in place.  Take off your gloves. Put them in the plastic bag with the old dressing. Tie the bag shut and throw it away.  Keep the dressing clean and dry until your next dressing change.  Wash your hands. SEEK MEDICAL CARE IF:  Your skin around the wound looks red.  Your wound feels more tender or sore.  You see pus in the wound.  Your wound smells bad.  You have a fever.  Your skin around the wound has a rash that itches and burns.  You see black or yellow skin in your wound that was not there before.  You feel nauseous, throw up, and feel very tired. Document Released: 03/24/2004 Document Revised: 05/09/2011 Document Reviewed: 12/27/2010 [facility] Patient Information 2014 [place], [place]. Dabigatran oral capsules What is this medicine? DABIGATRAN (DA bi GAT ran) is an anticoagulant (blood thinner). It is used to lower the chance of stroke in people with a medical condition called atrial fibrillation. It is also used to treat or prevent blood clots in the lungs or in the veins. This medicine may be used for other purposes; ask your health care provider or pharmacist if you have questions. COMMON BRAND NAME(S): Pradaxa  What should I tell my health care provider before I take this medicine? They need to know if you have any of these conditions: -bleeding disorders -history of stomach bleeding -mechanical heart valve -kidney disease -recent or planned spinal or epidural procedure -an allergic reaction to dabigatran, other medicines, foods, dyes, or preservatives -pregnant or trying to get pregnant -breast-feeding How should I use this medicine? Take this medicine by mouth with a full glass of water. Follow the directions on the prescription label. Swallow the capsules whole. Do not break,  chew, or empty the contents from the capsule. You can take it with or without food. If it upsets your stomach, take it with food. Take your medicine at regular intervals. Do not take it more often than directed. Do not stop taking except on your doctor's advice. Stopping this medicine may increase your risk of a blot clot. Be sure to refill your prescription before you run out of medicine. A special MedGuide will be given to you by the pharmacist with each prescription and refill. Be sure to read this information carefully each time. Talk to your pediatrician regarding the use of this medicine in children. Special care may be needed. Overdosage: If you think you have taken too much of this medicine contact a poison control center or emergency room at once. NOTE: This medicine is only for you. Do not share this medicine with others. What if I miss a dose? If you miss a dose, take it as soon as you can. If your next dose is less than 6 hours away, skip the missed dose. Do not take double or extra doses. What may interact with this medicine? Do not take this medicine with any of the following medications: -certain medicines that treat or prevent blood clots like warfarin, enoxaparin, and dalteparin -mifepristone, [id number] medicine may also interact with the following: -carbamazepine -certain medicines for depression -dronedarone -ketoconazole -NSAIDs, medicines for pain and inflammation, like ibuprofen or naproxen -quinidine -rifampin -tipranavir This list may not describe all possible interactions. Give your health care provider a list of all the medicines, herbs, non-prescription drugs, or dietary supplements you use. Also tell them if you smoke, drink alcohol, or use illegal drugs. Some items may interact with your medicine. What should I watch for while using this medicine? Visit your doctor or health care professional for regular check ups. Notify your doctor or health care professional and  seek emergency treatment if you develop breathing problems; changes in vision; chest pain; severe, sudden headache; pain, swelling, warmth in the leg; trouble speaking; sudden numbness or weakness of the face, arm, or leg. These can be signs that your condition has gotten worse. If you are going to have surgery, tell your doctor or health care professional that you are taking this medicine. Tell your health care professional that you use this medicine before you have a spinal or epidural procedure. Sometimes people who take this medicine have bleeding problems around the spine when they have a spinal or epidural procedure. This bleeding is very rare. If you have a spinal or epidural procedure while on this medicine, call your health care professional immediately if you have back pain, numbness or tingling (especially in your legs and feet), muscle weakness, paralysis, or loss of bladder or bowel control. Avoid sports and activities that might cause injury while you are using this medicine. Severe falls or injuries can cause unseen bleeding. Be careful when using sharp tools or knives. Consider using an Copy. Take special  care brushing or flossing your teeth. Report any injuries, bruising, or red spots on the skin to your doctor or health care professional. What side effects may I notice from receiving this medicine? Side effects that you should report to your doctor or health care professional as soon as possible: -allergic reactions like skin rash, itching or hives, swelling of the face, lips, or tongue -feeling faint or lightheaded, falls -signs and symptoms of bleeding such as bloody or black, tarry stools; red or dark-brown urine; spitting up blood or brown material that looks like coffee grounds; red spots on the skin; unusual bruising or bleeding from the eye, gums, or nose  Side effects that usually do not require medical attention (report these to your doctor or health care professional if  they continue or are bothersome): -stomach pain -upset stomach This list may not describe all possible side effects. Call your doctor for medical advice about side effects. You may report side effects to FDA at 1-800-FDA-1088. Where should I keep my medicine? Keep out of the reach of children. Store at room temperature between 15 and 30 degrees C (59 and 86 degrees F). Keep capsules in the original container. Do not store or place capsules in any other container, such as pill boxes or pill organizers. After opening the bottle, use within 4 months. Throw away any unused medicine after 4 months. NOTE: This sheet is a summary. It may not cover all possible information. If you have questions about this medicine, talk to your doctor, pharmacist, or health care provider.  2014, [employer]. (2012-10-13 22:28:33)

## 2013-04-09 ENCOUNTER — Telehealth: Payer: Self-pay | Admitting: Internal Medicine

## 2013-04-09 NOTE — Telephone Encounter (Signed)
Patient called stating he got a phone call stating that he needs a referral for Dr Elenora Gamma (skin doctor) Please advise

## 2013-04-11 ENCOUNTER — Telehealth: Payer: Self-pay | Admitting: Radiology

## 2013-04-11 NOTE — Telephone Encounter (Signed)
Patient on his way now for his appt with Dr Allyson Sabal, please send [facility] referral Thanks

## 2013-04-12 NOTE — Telephone Encounter (Signed)
Patient has seen Dr. Allyson Sabal on 2/12

## 2013-05-14 ENCOUNTER — Telehealth: Payer: Self-pay | Admitting: Pulmonary Disease

## 2013-05-14 NOTE — Telephone Encounter (Signed)
Called and spoke with pt. He reports he has been having problems with his nose getting stopped up off and on. Wanted to know what he can take OTC. I advised him he can try OTC nasal saline spray. He will try this. Nothing further needed

## 2013-05-22 ENCOUNTER — Other Ambulatory Visit: Payer: Self-pay | Admitting: Gastroenterology

## 2013-05-22 NOTE — Telephone Encounter (Signed)
NEEDS OFFICE VISIT FOR ANY FURTHER REFILLS! 

## 2013-05-24 ENCOUNTER — Encounter: Payer: Self-pay | Admitting: Pulmonary Disease

## 2013-05-24 ENCOUNTER — Ambulatory Visit (INDEPENDENT_AMBULATORY_CARE_PROVIDER_SITE_OTHER): Payer: Medicare PPO | Admitting: Pulmonary Disease

## 2013-05-24 VITALS — BP 134/90 | HR 80 | Temp 97.1°F | Ht 70.0 in | Wt 193.8 lb

## 2013-05-24 DIAGNOSIS — G4733 Obstructive sleep apnea (adult) (pediatric): Secondary | ICD-10-CM

## 2013-05-24 DIAGNOSIS — J310 Chronic rhinitis: Secondary | ICD-10-CM

## 2013-05-24 DIAGNOSIS — J449 Chronic obstructive pulmonary disease, unspecified: Secondary | ICD-10-CM

## 2013-05-24 MED ORDER — FLUTICASONE PROPIONATE 50 MCG/ACT NA SUSP: NASAL | Status: DC

## 2013-05-24 MED ORDER — ALBUTEROL SULFATE HFA 108 (90 BASE) MCG/ACT IN AERS: 2.0000 | INHALATION_SPRAY | Freq: Four times a day (QID) | RESPIRATORY_TRACT | Status: DC | PRN

## 2013-05-24 NOTE — Assessment & Plan Note (Signed)
Will have him resume nasal irrigation and flonase.  He can also use zyrtec as needed.

## 2013-05-24 NOTE — Assessment & Plan Note (Addendum)
Good control on current settings.  He will resume using CPAP once his sinuses are better.

## 2013-05-24 NOTE — Progress Notes (Signed)
Chief Complaint  Patient presents with  . Follow-up    Pt's sleeping has improved. Hasnt been wearing CPAP for about 5 weeks d/t sinus congestion. Denies chest congestion, cough and CP.     History of Present Illness: [name] is a 73 y.o. male with COPD with asthma, rhinitis and OSA.  He was seen in January.  He was treated with Zpak and prednisone >> these helped.  He has noticed more sinus congestion over the past few weeks.  As a result he has more trouble using CPAP.  He denies chest pain, fever, sputum, wheeze, or cough.    He has not been using nasal irrigation or flonase.  He ran out of proair.  Tests: PSG 08/05/10>>AHI 24.2, SpO2 low 77%, PLMI 15.8. PFT 07/26/11>>FEV1 2.62(91%), FEV1% 66, TLC 6.55(101%), DLCO 94%, +BD PSG with oral appliance 02/07/12>>AHI 11.5, SpO2 low 78%. Both obstructive and central events. Auto CPAP 09/24/12 to 11/19/12 >> Used on 52 of 57 nights with average 4 hrs 18 min per night.  Average AHI 8.5 (mostly centrals)  He  has a past medical history of Atrial fibrillation; Barrett's esophagus; Paroxysmal atrial fibrillation; GERD (gastroesophageal reflux disease); BPH (benign prostatic hyperplasia); Barrett esophagus; Hiatal hernia; Renal cell carcinoma (2009); OSA (obstructive sleep apnea) (08/05/10 PSG); Chronic obstructive asthma; Difficult intubation; Shortness of breath; and Allergy.  He  has past surgical history that includes exploratory laparotomy, segmental small bowel resection of a hemangioma, repair of incarcerated umbelical hernia (27/2536); Exploratory laparotomy w/ bowel resection (12/1999); renal cancer surgery with RF Ablation; Tonsillectomy; Excision basal cell carcinoma; and Eye surgery.   Current Outpatient Prescriptions on File Prior to Visit  Medication Sig Dispense Refill  . beclomethasone (QVAR) 80 MCG/ACT inhaler Inhale 1 puff into the lungs 2 (two) times daily.  1 Inhaler  5  . CARTIA XT 180 MG 24 hr capsule TAKE 1 CAPSULE  BY MOUTH DAILY  30 capsule  11  . dutasteride (AVODART) 0.5 MG capsule Take 0.5 mg by mouth daily.        . fluticasone (FLONASE) 50 MCG/ACT nasal spray INSTILL 2 SPRAYS IN EACH NOSTRIL EVERY DAY AS DIRECTED  16 g  5  . metoprolol succinate (TOPROL-XL) 50 MG 24 hr tablet TAKE 1 TABLET BY MOUTH EVERY DAY WITH OR FOLLOWING A MEAL  30 tablet  11  . Multiple Vitamin (MULTIVITAMIN) capsule Take 1 capsule by mouth daily. Twice/week      . omeprazole (PRILOSEC) 20 MG capsule TAKE 1 CAPSULE BY MOUTH DAILY  30 capsule  0  . PRADAXA 150 MG CAPS capsule TAKE 1 CAPSULE BY MOUTH EVERY 12 HOURS  60 capsule  11  . zolpidem (AMBIEN) 10 MG tablet Take 5 mg by mouth at bedtime as needed. For anxiety/insomnia      . cetirizine (ZYRTEC) 10 MG tablet Take 10 mg by mouth daily.      Marland Kitchen HYDROcodone-acetaminophen (NORCO/VICODIN) 5-325 MG per tablet Take 1 tablet by mouth every 6 (six) hours as needed.  30 tablet  0  . montelukast (SINGULAIR) 10 MG tablet Take 10 mg by mouth at bedtime.      . silver sulfADIAZINE (SILVADENE) 1 % cream Apply 1 application topically 2 (two) times daily. Apply 2 times daily after soap and water cleaning.  400 g  0   No current facility-administered medications on file prior to visit.    Allergies  Allergen Reactions  . Proair Hfa [Albuterol]     Physical Exam:  General -  No distress ENT - No sinus tenderness, decreased AP diameter oropharynx, no oral exudate, no LAN Cardiac - s1s2 regular, no murmur Chest - No wheeze/rales/dullness, good air entry, normal respiratory excursion Back - No focal tenderness Abd - Soft, non-tender Ext - No edema Neuro - Normal strength Skin - No rashes Psych - Normal mood, and behavior   Assessment/Plan:  [name], MD  Pulmonary/Critical Care/Sleep Pager:  [phone] 05/24/2013, 3:52 PM

## 2013-05-24 NOTE — Patient Instructions (Signed)
Nasal irrigation (saline nasal spray) daily for two weeks, then as needed Flonase two sprays each nostril daily for two weeks, then as needed Zyrtec 10 mg daily for one week, then as needed Qvar one puff twice per day >> and rinse mouth after each use Proair two puffs up to four times per day as needed for cough, wheeze, or chest congestion Follow up in 6 months

## 2013-05-24 NOTE — Assessment & Plan Note (Signed)
Continue Qvar.  Will refill proair.

## 2013-06-21 ENCOUNTER — Other Ambulatory Visit: Payer: Self-pay | Admitting: Gastroenterology

## 2013-06-24 NOTE — Telephone Encounter (Signed)
NEEDS OFFICE VISIT FOR ANY FURTHER REFILLS! 

## 2013-07-20 ENCOUNTER — Ambulatory Visit (INDEPENDENT_AMBULATORY_CARE_PROVIDER_SITE_OTHER): Payer: Medicare PPO | Admitting: Family Medicine

## 2013-07-20 ENCOUNTER — Other Ambulatory Visit: Payer: Self-pay | Admitting: Gastroenterology

## 2013-07-20 VITALS — BP 118/70 | HR 80 | Temp 97.2°F | Resp 14 | Ht 70.0 in | Wt 185.4 lb

## 2013-07-20 DIAGNOSIS — L02419 Cutaneous abscess of limb, unspecified: Secondary | ICD-10-CM

## 2013-07-20 DIAGNOSIS — Z719 Counseling, unspecified: Secondary | ICD-10-CM

## 2013-07-20 DIAGNOSIS — L03115 Cellulitis of right lower limb: Secondary | ICD-10-CM

## 2013-07-20 DIAGNOSIS — L03119 Cellulitis of unspecified part of limb: Secondary | ICD-10-CM

## 2013-07-20 MED ORDER — DOXYCYCLINE HYCLATE 100 MG PO TABS: 100.0000 mg | ORAL_TABLET | Freq: Two times a day (BID) | ORAL | Status: DC

## 2013-07-20 NOTE — Progress Notes (Addendum)
Subjective:    Patient ID: [name], male    DOB: [birth year], 73 y.o.   MRN: [id number]  This chart was scribed for Merri Ray, MD by Erling Conte, Medical Scribe. This patient was seen in Room 12 and the patient's care was started at 12:06 PM.  Chief Complaint  Patient presents with  . Rash    left leg, back, neck and other places, x 2-3 days on leg, 3-4 months others, itchy, red     HPI  HPI Comments: [name] is a 73 y.o. male who presents to [facility] complaining of an intermittent, episodic, itchy, red rash that is localized on his right inner thigh that began 2-3 days ago. Patient states that he has had similar symptoms like this before, around 3-4 months ago, he had a rash that spread all over the back of his neck. Patient states the rash went away and then about a month later he states that it came back on his left flank and back. He states that the episodic rashes would last about 2-3 weeks and then go away. Patient denies being bit by any insects or ticks in the past couple of weeks. Patient has not taken any Benadryl or antihistamine creams for the symptoms. Patient denies any hives.  Patient had outside screen tests on May 14th and carotid artery disease screening w/ possible plaque buildup and tests indicated mild abnormality. His BMI was 26. Patient CRP was 1.43. Other wise tests all in the normal range. Patient denies any lightheadedness or dizziness. He denies any history of strokes. He states he does have history of atrial fibrillation. Patient states he has been seeing Dr. Verl Blalock at [facility], but will be going to be setting up an appointment with a new cardiologist as Dr. Verl Blalock transitioning him to new provider.   PCP: Kennon Portela, MD  Patient Active Problem List   Diagnosis Date Noted  . BPH (benign prostatic hyperplasia) 11/07/2011  . Hip pain 07/04/2011  . Perianal itch 07/04/2011  . OSA (obstructive sleep apnea)  08/08/2010  . Chronic obstructive asthma 07/14/2010  . Rhinitis 07/14/2010  . Cough 07/13/2010  . BARRETT'S ESOPHAGUS 03/11/2009  . PAROXYSMAL ATRIAL FIBRILLATION 07/29/2008  . GERD 07/29/2008   Past Medical History  Diagnosis Date  . Atrial fibrillation   . Barrett's esophagus   . Paroxysmal atrial fibrillation     Hospitalized on 05/27/10  . GERD (gastroesophageal reflux disease)   . BPH (benign prostatic hyperplasia)   . Barrett esophagus   . Hiatal hernia   . Renal cell carcinoma 2009    right renal mass, followed by Dr. Ander Slade  . OSA (obstructive sleep apnea) 08/05/10 PSG    AHI 24  . Chronic obstructive asthma   . Difficult intubation   . Shortness of breath   . Allergy    Past Surgical History  Procedure Laterality Date  . Exploratory laparotomy, segmental small bowel resection of a hemangioma, repair of incarcerated umbelical hernia  [id number]  . Exploratory laparotomy w/ bowel resection  12/1999    segmental sbr of a hemangioma, repair of incarcerated umbilical hernia  . Renal cancer surgery with rf ablation    . Tonsillectomy    . Basal cell carcinoma excision    . Eye surgery     Allergies  Allergen Reactions  . Proair Hfa [Albuterol]    Prior to Admission medications   Medication Sig Start Date End Date Taking? Authorizing Provider  albuterol (PROAIR HFA) 108 (90 BASE) MCG/ACT inhaler Inhale 2 puffs into the lungs every 6 (six) hours as needed for wheezing or shortness of breath. 05/24/13  Yes [name], MD  beclomethasone (QVAR) 80 MCG/ACT inhaler Inhale 1 puff into the lungs 2 (two) times daily. 11/28/12  Yes [name], MD  CARTIA XT 180 MG 24 hr capsule TAKE 1 CAPSULE BY MOUTH DAILY 10/29/12  Yes Liliane Shi, PA-C  cetirizine (ZYRTEC) 10 MG tablet Take 10 mg by mouth daily.   Yes Historical Provider, MD  dutasteride (AVODART) 0.5 MG capsule Take 0.5 mg by mouth daily.     Yes Historical Provider, MD  fluticasone (FLONASE) 50 MCG/ACT nasal spray INSTILL  2 SPRAYS IN EACH NOSTRIL EVERY DAY AS DIRECTED 05/24/13  Yes [name], MD  HYDROcodone-acetaminophen (NORCO/VICODIN) 5-325 MG per tablet Take 1 tablet by mouth every 6 (six) hours as needed. 04/02/13  Yes Orma Flaming, MD  metoprolol succinate (TOPROL-XL) 50 MG 24 hr tablet TAKE 1 TABLET BY MOUTH EVERY DAY WITH OR FOLLOWING A MEAL 10/29/12  Yes Scott T Kathlen Mody, PA-C  montelukast (SINGULAIR) 10 MG tablet Take 10 mg by mouth at bedtime.   Yes Historical Provider, MD  Multiple Vitamin (MULTIVITAMIN) capsule Take 1 capsule by mouth daily. Twice/week   Yes Historical Provider, MD  omeprazole (PRILOSEC) 20 MG capsule TAKE ONE CAPSULE BY MOUTH EVERY DAY   Yes [name], MD  PRADAXA 150 MG CAPS capsule TAKE 1 CAPSULE BY MOUTH EVERY 12 HOURS 09/29/12  Yes Renella Cunas, MD  silver sulfADIAZINE (SILVADENE) 1 % cream Apply 1 application topically 2 (two) times daily. Apply 2 times daily after soap and water cleaning. 04/02/13  Yes Orma Flaming, MD  zolpidem (AMBIEN) 10 MG tablet Take 5 mg by mouth at bedtime as needed. For anxiety/insomnia 11/19/12  Yes Orma Flaming, MD  doxycycline (VIBRA-TABS) 100 MG tablet Take 1 tablet (100 mg total) by mouth 2 (two) times daily. 07/20/13   [name], MD   History   Social History  . Marital Status: Single    Spouse Name: N/A    Number of Children: N/A  . Years of Education: N/A   Occupational History  . tv producer/construction   . televison Secretary/administrator    Social History Main Topics  . Smoking status: Former Smoker -- 10 years    Types: Cigars    Quit date: 02/29/1980  . Smokeless tobacco: Never Used     Comment: quit when he was 73 years old  . Alcohol Use: No  . Drug Use: No  . Sexual Activity: Not Currently   Other Topics Concern  . Not on file   Social History Narrative   ** Merged History Encounter ** Married. Education: [employer]. Exercise: Eleptical times a week for 45 minutes.             Review of Systems  Skin: Positive for rash  (localized on right inner thigh).  Neurological: Negative for dizziness and light-headedness.  All other systems reviewed and are negative.      Objective:   Physical Exam  Nursing note and vitals reviewed. Constitutional: He is oriented to person, place, and time. He appears well-developed and well-nourished. No distress.  HENT:  Head: Normocephalic and atraumatic.  Eyes: EOM are normal.  Neck: Neck supple. Carotid bruit is not present. No tracheal deviation present.  Cardiovascular: Normal rate, regular rhythm and normal heart sounds.   Pulmonary/Chest: Effort normal. No  respiratory distress.  Musculoskeletal: Normal range of motion.  Neurological: He is alert and oriented to person, place, and time.  Skin: Skin is warm and dry.  16x16cm of faint erythema on his right inner thigh. Central darkened erythema 8cmx5cm with one small darkened papule centrally. No apparent foreign body or tick. Indurated in central darkened reddened area. No fluctuance   Psychiatric: He has a normal mood and affect. His behavior is normal.    Filed Vitals:   07/20/13 1052  BP: 118/70  Pulse: 80  Temp: 97.2 F (36.2 C)  TempSrc: Oral  Resp: 14  Height: 5\' 10"  (1.778 m)  Weight: 185 lb 6.4 oz (84.097 kg)  SpO2: 99%         Assessment & Plan:  .scirbe    [name] is a 73 y.o. male Cellulitis of leg, right - Plan: doxycycline (VIBRA-TABS) 100 MG tablet, Wound culture  Counseling NOS  Appears to be cellulitis.  No apparent tick bite. No other rash at this time.   -start doxycycline 100mg  BID, warm compress, recheck in 3 days - sooner if worse.   Plans on transfer of PCP from [name] to me. Has CPE scheduled. Health screen info reviewed. Mild abnormality on carotid screening but asx and no bruit auscultated. Can follow up this and CRP with cardiologist.   Meds ordered this encounter  Medications  . doxycycline (VIBRA-TABS) 100 MG tablet    Sig: Take 1 tablet (100 mg total) by  mouth 2 (two) times daily.    Dispense:  20 tablet    Refill:  0   Patient Instructions  Start antibiotic for leg infection. Warm compresses 4-5 times per day. Recheck with Dr. Carlota Raspberry Tuesday after 9 am, or with another provider sooner if any spread of redness or worsening. Return to the clinic or go to the nearest emergency room if any of your symptoms worsen or new symptoms occur.  Cellulitis Cellulitis is an infection of the skin and the tissue beneath it. The infected area is usually red and tender. Cellulitis occurs most often in the arms and lower legs.  CAUSES  Cellulitis is caused by bacteria that enter the skin through cracks or cuts in the skin. The most common types of bacteria that cause cellulitis are Staphylococcus and Streptococcus. SYMPTOMS   Redness and warmth.  Swelling.  Tenderness or pain.  Fever. DIAGNOSIS  Your caregiver can usually determine what is wrong based on a physical exam. Blood tests may also be done. TREATMENT  Treatment usually involves taking an antibiotic medicine. HOME CARE INSTRUCTIONS   Take your antibiotics as directed. Finish them even if you start to feel better.  Keep the infected arm or leg elevated to reduce swelling.  Apply a warm cloth to the affected area up to 4 times per day to relieve pain.  Only take over-the-counter or prescription medicines for pain, discomfort, or fever as directed by your caregiver.  Keep all follow-up appointments as directed by your caregiver. SEEK MEDICAL CARE IF:   You notice red streaks coming from the infected area.  Your red area gets larger or turns dark in color.  Your bone or joint underneath the infected area becomes painful after the skin has healed.  Your infection returns in the same area or another area.  You notice a swollen bump in the infected area.  You develop new symptoms. SEEK IMMEDIATE MEDICAL CARE IF:   You have a fever.  You feel very sleepy.  You  develop vomiting or  diarrhea.  You have a general ill feeling (malaise) with muscle aches and pains. MAKE SURE YOU:   Understand these instructions.  Will watch your condition.  Will get help right away if you are not doing well or get worse. Document Released: 11/24/2004 Document Revised: 08/16/2011 Document Reviewed: 05/02/2011 [facility] Patient Information 2014 [place].     I personally performed the services described in this documentation, which was scribed in my presence. The recorded information has been reviewed and considered, and addended by me as needed.

## 2013-07-20 NOTE — Patient Instructions (Signed)
Start antibiotic for leg infection. Warm compresses 4-5 times per day. Recheck with Dr. Carlota Raspberry Tuesday after 9 am, or with another provider sooner if any spread of redness or worsening. Return to the clinic or go to the nearest emergency room if any of your symptoms worsen or new symptoms occur.  Cellulitis Cellulitis is an infection of the skin and the tissue beneath it. The infected area is usually red and tender. Cellulitis occurs most often in the arms and lower legs.  CAUSES  Cellulitis is caused by bacteria that enter the skin through cracks or cuts in the skin. The most common types of bacteria that cause cellulitis are Staphylococcus and Streptococcus. SYMPTOMS   Redness and warmth.  Swelling.  Tenderness or pain.  Fever. DIAGNOSIS  Your caregiver can usually determine what is wrong based on a physical exam. Blood tests may also be done. TREATMENT  Treatment usually involves taking an antibiotic medicine. HOME CARE INSTRUCTIONS   Take your antibiotics as directed. Finish them even if you start to feel better.  Keep the infected arm or leg elevated to reduce swelling.  Apply a warm cloth to the affected area up to 4 times per day to relieve pain.  Only take over-the-counter or prescription medicines for pain, discomfort, or fever as directed by your caregiver.  Keep all follow-up appointments as directed by your caregiver. SEEK MEDICAL CARE IF:   You notice red streaks coming from the infected area.  Your red area gets larger or turns dark in color.  Your bone or joint underneath the infected area becomes painful after the skin has healed.  Your infection returns in the same area or another area.  You notice a swollen bump in the infected area.  You develop new symptoms. SEEK IMMEDIATE MEDICAL CARE IF:   You have a fever.  You feel very sleepy.  You develop vomiting or diarrhea.  You have a general ill feeling (malaise) with muscle aches and pains. MAKE SURE  YOU:   Understand these instructions.  Will watch your condition.  Will get help right away if you are not doing well or get worse. Document Released: 11/24/2004 Document Revised: 08/16/2011 Document Reviewed: 05/02/2011 [facility] Patient Information 2014 [place].

## 2013-07-22 LAB — WOUND CULTURE
Gram Stain: NONE SEEN
Gram Stain: NONE SEEN
Gram Stain: NONE SEEN
Organism ID, Bacteria: NO GROWTH

## 2013-07-29 ENCOUNTER — Telehealth: Payer: Self-pay | Admitting: Gastroenterology

## 2013-07-29 ENCOUNTER — Other Ambulatory Visit: Payer: Self-pay | Admitting: Gastroenterology

## 2013-07-29 MED ORDER — OMEPRAZOLE 20 MG PO CPDR: DELAYED_RELEASE_CAPSULE | ORAL | Status: DC

## 2013-07-29 NOTE — Telephone Encounter (Signed)
Sent one refill to patient's pharmacy and patient to keep appt for any further refills.

## 2013-08-19 ENCOUNTER — Other Ambulatory Visit: Payer: Self-pay | Admitting: Gastroenterology

## 2013-09-13 ENCOUNTER — Ambulatory Visit (INDEPENDENT_AMBULATORY_CARE_PROVIDER_SITE_OTHER): Payer: Medicare PPO | Admitting: Internal Medicine

## 2013-09-13 VITALS — BP 110/68 | HR 83 | Temp 97.8°F | Resp 16 | Ht 69.0 in | Wt 192.0 lb

## 2013-09-13 DIAGNOSIS — W57XXXA Bitten or stung by nonvenomous insect and other nonvenomous arthropods, initial encounter: Secondary | ICD-10-CM

## 2013-09-13 DIAGNOSIS — L299 Pruritus, unspecified: Secondary | ICD-10-CM

## 2013-09-13 DIAGNOSIS — IMO0002 Reserved for concepts with insufficient information to code with codable children: Secondary | ICD-10-CM

## 2013-09-13 DIAGNOSIS — T148 Other injury of unspecified body region: Secondary | ICD-10-CM

## 2013-09-13 DIAGNOSIS — R21 Rash and other nonspecific skin eruption: Secondary | ICD-10-CM

## 2013-09-13 MED ORDER — CLOBETASOL PROPIONATE 0.05 % EX CREA: TOPICAL_CREAM | CUTANEOUS | Status: DC

## 2013-09-13 MED ORDER — DOXYCYCLINE HYCLATE 100 MG PO TABS: 100.0000 mg | ORAL_TABLET | Freq: Two times a day (BID) | ORAL | Status: DC

## 2013-09-13 NOTE — Progress Notes (Signed)
   Subjective:    Patient ID: [name], male    DOB: [birth year], 73 y.o.   MRN: [id number]  HPI  73 year old caucasian male presents to clinic today with "bug bites"  - states he stayed in hotel around [facility] and came home with these bites.  Pt has bites all over his body.  Bites are large, red, swollen, and itching as resolved some. Left fore arm red swollen minimally tender.    Review of Systems meds reviewed/hx afib    Objective:   Physical Exam  Constitutional: He is oriented to person, place, and time. He appears well-developed and well-nourished. No distress.  HENT:  Head: Normocephalic.  Eyes: EOM are normal. No scleral icterus.  Neck: Normal range of motion.  Cardiovascular: Normal rate, regular rhythm and normal heart sounds.   Pulmonary/Chest: Effort normal and breath sounds normal.  Musculoskeletal: Normal range of motion.  Neurological: He is alert and oriented to person, place, and time. He exhibits normal muscle tone. Coordination normal.  Skin: Rash noted. Rash is maculopapular and nodular. There is erythema.     Most are nodular none tender typical insect bites  Left fore arm has bug bites and confluent erythema but is not very tender yet early cellulitic appearing.  Psychiatric: He has a normal mood and affect. His behavior is normal. Thought content normal.          Assessment & Plan:  Doxycycline 100mg  bid Clobetasol ream for itching

## 2013-09-13 NOTE — Patient Instructions (Addendum)
Cellulitis Cellulitis is an infection of the skin and the tissue beneath it. The infected area is usually red and tender. Cellulitis occurs most often in the arms and lower legs.  CAUSES  Cellulitis is caused by bacteria that enter the skin through cracks or cuts in the skin. The most common types of bacteria that cause cellulitis are Staphylococcus and Streptococcus. SYMPTOMS   Redness and warmth.  Swelling.  Tenderness or pain.  Fever. DIAGNOSIS  Your caregiver can usually determine what is wrong based on a physical exam. Blood tests may also be done. TREATMENT  Treatment usually involves taking an antibiotic medicine. HOME CARE INSTRUCTIONS   Take your antibiotics as directed. Finish them even if you start to feel better.  Keep the infected arm or leg elevated to reduce swelling.  Apply a warm cloth to the affected area up to 4 times per day to relieve pain.  Only take over-the-counter or prescription medicines for pain, discomfort, or fever as directed by your caregiver.  Keep all follow-up appointments as directed by your caregiver. SEEK MEDICAL CARE IF:   You notice red streaks coming from the infected area.  Your red area gets larger or turns dark in color.  Your bone or joint underneath the infected area becomes painful after the skin has healed.  Your infection returns in the same area or another area.  You notice a swollen bump in the infected area.  You develop new symptoms. SEEK IMMEDIATE MEDICAL CARE IF:   You have a fever.  You feel very sleepy.  You develop vomiting or diarrhea.  You have a general ill feeling (malaise) with muscle aches and pains. MAKE SURE YOU:   Understand these instructions.  Will watch your condition.  Will get help right away if you are not doing well or get worse. Document Released: 11/24/2004 Document Revised: 08/16/2011 Document Reviewed: 05/02/2011 [facility] Patient Information 2015 [place], [place]. This information is  not intended to replace advice given to you by your health care provider. Make sure you discuss any questions you have with your health care provider. Insect Bite Mosquitoes, flies, fleas, bedbugs, and many other insects can bite. Insect bites are different from insect stings. A sting is when venom is injected into the skin. Some insect bites can transmit infectious diseases. SYMPTOMS  Insect bites usually turn red, swell, and itch for 2 to 4 days. They often go away on their own. TREATMENT  Your caregiver may prescribe antibiotic medicines if a bacterial infection develops in the bite. HOME CARE INSTRUCTIONS  Do not scratch the bite area.  Keep the bite area clean and dry. Wash the bite area thoroughly with soap and water.  Put ice or cool compresses on the bite area.  Put ice in a plastic bag.  Place a towel between your skin and the bag.  Leave the ice on for 20 minutes, 4 times a day for the first 2 to 3 days, or as directed.  You may apply a baking soda paste, cortisone cream, or calamine lotion to the bite area as directed by your caregiver. This can help reduce itching and swelling.  Only take over-the-counter or prescription medicines as directed by your caregiver.  If you are given antibiotics, take them as directed. Finish them even if you start to feel better. You may need a tetanus shot if:  You cannot remember when you had your last tetanus shot.  You have never had a tetanus shot.  The injury broke your skin.  If you get a tetanus shot, your arm may swell, get red, and feel warm to the touch. This is common and not a problem. If you need a tetanus shot and you choose not to have one, there is a rare chance of getting tetanus. Sickness from tetanus can be serious. SEEK IMMEDIATE MEDICAL CARE IF:   You have increased pain, redness, or swelling in the bite area.  You see a red line on the skin coming from the bite.  You have a fever.  You have joint pain.  You  have a headache or neck pain.  You have unusual weakness.  You have a rash.  You have chest pain or shortness of breath.  You have abdominal pain, nausea, or vomiting.  You feel unusually tired or sleepy. MAKE SURE YOU:   Understand these instructions.  Will watch your condition.  Will get help right away if you are not doing well or get worse. Document Released: 03/24/2004 Document Revised: 05/09/2011 Document Reviewed: 09/15/2010 [facility] Patient Information 2015 [place], [place]. This information is not intended to replace advice given to you by your health care provider. Make sure you discuss any questions you have with your health care provider.

## 2013-09-16 ENCOUNTER — Ambulatory Visit (INDEPENDENT_AMBULATORY_CARE_PROVIDER_SITE_OTHER): Payer: Medicare PPO | Admitting: Family Medicine

## 2013-09-16 ENCOUNTER — Encounter: Payer: Self-pay | Admitting: Family Medicine

## 2013-09-16 ENCOUNTER — Ambulatory Visit: Payer: Medicare PPO | Admitting: Family Medicine

## 2013-09-16 VITALS — BP 148/82 | HR 79 | Temp 98.0°F | Resp 16 | Ht 69.5 in | Wt 187.8 lb

## 2013-09-16 DIAGNOSIS — K625 Hemorrhage of anus and rectum: Secondary | ICD-10-CM

## 2013-09-16 DIAGNOSIS — M25476 Effusion, unspecified foot: Secondary | ICD-10-CM

## 2013-09-16 DIAGNOSIS — Z23 Encounter for immunization: Secondary | ICD-10-CM

## 2013-09-16 DIAGNOSIS — R221 Localized swelling, mass and lump, neck: Secondary | ICD-10-CM

## 2013-09-16 DIAGNOSIS — T148 Other injury of unspecified body region: Secondary | ICD-10-CM

## 2013-09-16 DIAGNOSIS — M25471 Effusion, right ankle: Secondary | ICD-10-CM

## 2013-09-16 DIAGNOSIS — R220 Localized swelling, mass and lump, head: Secondary | ICD-10-CM

## 2013-09-16 DIAGNOSIS — M25473 Effusion, unspecified ankle: Secondary | ICD-10-CM

## 2013-09-16 DIAGNOSIS — J3489 Other specified disorders of nose and nasal sinuses: Secondary | ICD-10-CM

## 2013-09-16 DIAGNOSIS — L290 Pruritus ani: Secondary | ICD-10-CM

## 2013-09-16 DIAGNOSIS — W57XXXA Bitten or stung by nonvenomous insect and other nonvenomous arthropods, initial encounter: Secondary | ICD-10-CM

## 2013-09-16 DIAGNOSIS — N529 Male erectile dysfunction, unspecified: Secondary | ICD-10-CM

## 2013-09-16 DIAGNOSIS — Z0000 Encounter for general adult medical examination without abnormal findings: Secondary | ICD-10-CM

## 2013-09-16 LAB — CBC
HCT: 41.6 % (ref 39.0–52.0)
HEMOGLOBIN: 14.3 g/dL (ref 13.0–17.0)
MCH: 30.1 pg (ref 26.0–34.0)
MCHC: 34.4 g/dL (ref 30.0–36.0)
MCV: 87.6 fL (ref 78.0–100.0)
Platelets: 212 10*3/uL (ref 150–400)
RBC: 4.75 MIL/uL (ref 4.22–5.81)
RDW: 14.1 % (ref 11.5–15.5)
WBC: 4.9 10*3/uL (ref 4.0–10.5)

## 2013-09-16 LAB — COMPLETE METABOLIC PANEL WITH GFR
ALBUMIN: 3.9 g/dL (ref 3.5–5.2)
ALT: 12 U/L (ref 0–53)
AST: 16 U/L (ref 0–37)
Alkaline Phosphatase: 73 U/L (ref 39–117)
BUN: 14 mg/dL (ref 6–23)
CALCIUM: 9.3 mg/dL (ref 8.4–10.5)
CHLORIDE: 102 meq/L (ref 96–112)
CO2: 29 mEq/L (ref 19–32)
Creat: 1.2 mg/dL (ref 0.50–1.35)
GFR, Est African American: 68 mL/min
GFR, Est Non African American: 59 mL/min — ABNORMAL LOW
GLUCOSE: 84 mg/dL (ref 70–99)
POTASSIUM: 4.6 meq/L (ref 3.5–5.3)
SODIUM: 139 meq/L (ref 135–145)
TOTAL PROTEIN: 7.2 g/dL (ref 6.0–8.3)
Total Bilirubin: 0.6 mg/dL (ref 0.2–1.2)

## 2013-09-16 LAB — LIPID PANEL
CHOL/HDL RATIO: 3.3 ratio
CHOLESTEROL: 157 mg/dL (ref 0–200)
HDL: 48 mg/dL (ref >39)
LDL Cholesterol: 97 mg/dL (ref 0–99)
Triglycerides: 61 mg/dL (ref <150)
VLDL: 12 mg/dL (ref 0–40)

## 2013-09-16 NOTE — Progress Notes (Addendum)
Subjective:  This chart was scribed for [name], MD, by Neta Ehlers, ED Scribe. This patient's care was started at 3:22 PM.    Patient ID: [name], male    DOB: [birth year], 73 y.o.   MRN: [id number]  HPI  [name] is a 73 y.o. male. PCP: Kennon Portela, MD  [name] is a newly established pt to me, prior pt of [name]. He is here for a complete physical. He has a h/o multiple medical problems including atrial fibrilation, asthma, GERD with Barrett's esophagus, and BPH.  Previously followed by Dr. Verl Blalock at [facility] and followed by Dr. Fuller Plan for gastroenterology.   1. GERD with Barrett's esophagus: endoscopy four years ago in June 2011. Continued on PPI qam with plan on follow-up in three years. No dysplasia on biopsy in 2011.   2. Colonoscopy in June 2011. Normal with repeat in 10 years.   3. Vaccines:  Pneumonia vaccine:  Pt had not received Prevnar. His last pneumonia vaccination was in February 2009 with a prior in January 2001.  Shingles vaccine: The pt received a shingles vaccination in September 2011.  Tetanus: He had a Tdap in Feb 2015. The pt reports he receives influenza vaccine annually.  4. Optho and dentist: Dr. Hassell Done follows him for optho; his last check-up was over a year ago. The pt reports he visits the dentist several times a week.   5. Prostate cancer screening: PSA in September 2014 normal at 2.79.  In the office, the pt report he is regularly followed by a urologist for BPH.   6. Last EKG was in September 2014. Showed A-fib.   7. Exercise: In the office, the pt reports he exercises approximately 50 minutes 3-4 days a week.   8. He has multiple other concerns identified on paperwork today.           1. He reports he was treated by [name] three days ago for multiple bug bites and was diagnosed with bedbugs; the pt reports improvement with the prescribed medication.         2. He also requests information about erectile  dysfunction. He has noticed in the past few months an inability to sustain firmness of erection. He has intercourse with his wife approximately once a month. He has not taken any medication. He is followed by a cardiologist and plans on following up with them re adverse effects of medications. Will contact me for prescription if needed.          3. Additionally, he reports anal bleeding with itching after using wipes which occurs approximately once a month and has occurred for approximately six months. He states he notices the blood on wipes only after scratching at the itch. He has an appointment with his gastroenterologist next week.         4. He also endorses right ankle swelling which is not associated with pain and which he first noticed yesterday. He denies weakness or decreased ROM associated with the numbness.        5. He also reports a growth to the left nare which he noticed several months ago. There is intermittent crusting associated with the growth, but no pain.         6. [name] also states a h/o RF ablation to his right kidney for a growth performed approximately 3  years ago. He is following up with Dr. Rosana Hoes regularly. He denies difficulty urinating or hematuria.  7. Re obstructive sleep apnea, he states he uses CPAP machine regularly.         8. He denies using the Albuterol inhaler, and reports his cardiologist cautions him against using it. He reports usage of Qvar.         9. 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 Screening tests in May overall normal. He did have a CRP of 1.43 and slight elevated BMI at 26 as well as possible plaque build-up of carotid.        10. He also reports a site to his right second toe is being monitored by an orthopedic.    Patient Active Problem List   Diagnosis Date Noted  . BPH (benign prostatic hyperplasia) 11/07/2011  . Hip pain 07/04/2011  . Perianal itch  07/04/2011  . OSA (obstructive sleep apnea) 08/08/2010  . Chronic obstructive asthma 07/14/2010  . Rhinitis 07/14/2010  . Cough 07/13/2010  . BARRETT'S ESOPHAGUS 03/11/2009  . PAROXYSMAL ATRIAL FIBRILLATION 07/29/2008  . GERD 07/29/2008   Past Medical History  Diagnosis Date  . Atrial fibrillation   . Barrett's esophagus   . Paroxysmal atrial fibrillation     Hospitalized on 05/27/10  . GERD (gastroesophageal reflux disease)   . BPH (benign prostatic hyperplasia)   . Barrett esophagus   . Hiatal hernia   . Renal cell carcinoma 2009    right renal mass, followed by Dr. Ander Slade  . OSA (obstructive sleep apnea) 08/05/10 [facility]    AHI 24  . Chronic obstructive asthma   . Difficult intubation   . Shortness of breath   . Allergy    Past Surgical History  Procedure Laterality Date  . Exploratory laparotomy, segmental small bowel resection of a hemangioma, repair of incarcerated umbelical hernia  [id number]/5638  . Exploratory laparotomy w/ bowel resection  12/1999    segmental sbr of a hemangioma, repair of incarcerated umbilical hernia  . Renal cancer surgery with rf ablation    . Tonsillectomy    . Basal cell carcinoma excision    . Eye surgery      Allergies  Allergen Reactions  . Proair Hfa [Albuterol]     Prior to Admission medications   Medication Sig Start Date End Date Taking? Authorizing Provider  Apoaequorin (PREVAGEN PO) Take by mouth.   Yes Historical Provider, MD  beclomethasone (QVAR) 80 MCG/ACT inhaler Inhale 1 puff into the lungs 2 (two) times daily. 11/28/12  Yes [name], MD  CARTIA XT 180 MG 24 hr capsule TAKE 1 CAPSULE BY MOUTH DAILY 10/29/12  Yes Liliane Shi, PA-C  cetirizine (ZYRTEC) 10 MG tablet Take 10 mg by mouth daily.   Yes Historical Provider, MD  doxycycline (VIBRA-TABS) 100 MG tablet Take 1 tablet (100 mg total) by mouth 2 (two) times daily. 09/13/13  Yes Orma Flaming, MD  dutasteride (AVODART) 0.5 MG capsule Take 0.5 mg by mouth daily.     Yes  Historical Provider, MD  Glucos-Chondroit-Hyaluron-MSM (GLUCOSAMINE CHONDROITIN JOINT PO) Take by mouth.   Yes Historical Provider, MD  metoprolol succinate (TOPROL-XL) 50 MG 24 hr tablet TAKE 1 TABLET BY MOUTH EVERY DAY WITH OR FOLLOWING A MEAL 10/29/12  Yes Liliane Shi, PA-C  Multiple Vitamin (MULTIVITAMIN) capsule Take 1 capsule by mouth daily. Twice/week   Yes Historical Provider, MD  omega-3 fish oil (MAXEPA) 1000 MG CAPS capsule Take by mouth.   Yes Historical Provider, MD  omeprazole ([place])  20 MG capsule TAKE ONE CAPSULE BY MOUTH EVERY DAY 08/19/13  Yes [name], MD  OVER THE COUNTER MEDICATION Sleep apnea machine   Yes Historical Provider, MD  PRADAXA 150 MG CAPS capsule TAKE 1 CAPSULE BY MOUTH EVERY 12 HOURS 09/29/12  Yes Renella Cunas, MD  zolpidem (AMBIEN) 10 MG tablet Take 5 mg by mouth at bedtime as needed. For anxiety/insomnia 11/19/12  Yes Orma Flaming, MD  albuterol [facility] HFA) 108 (90 BASE) MCG/ACT inhaler Inhale 2 puffs into the lungs every 6 (six) hours as needed for wheezing or shortness of breath. 05/24/13   [name], MD  clobetasol cream (TEMOVATE) 0.05 % Apply bid to itchy rash but not on face or genitals 09/13/13   Orma Flaming, MD  fluticasone [facility]) 50 MCG/ACT nasal spray INSTILL 2 SPRAYS IN EACH NOSTRIL EVERY DAY AS DIRECTED 05/24/13   [name], MD   Review of Systems 13 point review of systems per patient health survey noted.  Negative other than as indicated on reviewed nursing note.      Objective:   Physical Exam  Vitals reviewed. Constitutional: He is oriented to person, place, and time. He appears well-developed and well-nourished.  HENT:  Head: Normocephalic and atraumatic.  Right Ear: External ear normal.  Left Ear: External ear normal.  Mouth/Throat: Oropharynx is clear and moist.  Nasal exam: Filiform projections flesh-colored in the lateral nose just inside the nares. Non-obstructive. No apparent bleeding or discharge.   Eyes:  Conjunctivae and EOM are normal. Pupils are equal, round, and reactive to light.  Neck: Normal range of motion. Neck supple. No thyromegaly present.  Cardiovascular: Normal rate, regular rhythm, normal heart sounds and intact distal pulses.   Pulmonary/Chest: Effort normal and breath sounds normal. No respiratory distress. He has no wheezes.  Abdominal: Soft. He exhibits no distension. There is no tenderness.  Genitourinary:  Rectal external: Some dried stool with slight erythema and raw appearance of perianal skin without active bleeding or hemorrhoids.  DRE was deferred as he will follow-up with urology in next few weeks.   Musculoskeletal: Normal range of motion. He exhibits no edema and no tenderness.  Right ankle: No bony tenderness. Calf non-tender and no calf swelling. Full ROM of ankle. NVI distally. No pain with ROM or exam.   Lymphadenopathy:    He has no cervical adenopathy.  Neurological: He is alert and oriented to person, place, and time. He has normal reflexes.  Skin: Skin is warm and dry.  Multiple seborrheic keratoses.  A few round erythematous patches in lines of two to three on right side of back. A few scattered patches of erythema to left leg in lines of two to three.  Psychiatric: He has a normal mood and affect. His behavior is normal.    Filed Vitals:   09/16/13 1510  BP: 148/82  Pulse: 79  Temp: 98 F (36.7 C)  TempSrc: Oral  Resp: 16  Height: 5' 9.5" (1.765 m)  Weight: 187 lb 12.8 oz (85.186 kg)  SpO2: 98%     Visual Acuity Screening   Right eye Left eye Both eyes  Without correction: 20/20 20/25 20/20   With correction:      Fall and depression screen. Depression is 0/2. On the fall screening, he had one fall in the past year.     Assessment & Plan:   [name] is a 73 y.o. male Annual physical exam - Plan: COMPLETE METABOLIC PANEL WITH GFR, Lipid panel,  CBC  - -anticipatory guidance as below in AVS, screening labs above. Health maintenance  items as above in HPI discussed/recommended as applicable.   - prevnar given for updated pneumonia vaccination.   Deferred prostate testing or EKG as planning on follow up with urology and cardiology in near future.   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 Will have evaluated by ent for removal/bx.   Pruritus ani, Rectal bleeding  -suspect pruritus ani with scratching of irritated area causing intermittent bleeding. Discussed keeping area free of residual stool, but not "over cleaning" or over wiping.  Other dietary avoidance measures as below. If persistent -  May need GI or colorectal surgeon eval.    Bedbug bites - improving. No sign of secondary infection at this time., but ok to continue doxy, and topical steroid as needed for sx care.   Ankle swelling, right - NKI, and pain free rom. No calf symptoms.  XR deferred today, but if not improving in next week, or worsening sooner - RTC for recheck.    Erectile dysfunction, unspecified erectile dysfunction type  - discussed possible trial of Viagra or LEvitra, and some of the possible side effects or reactions, not limited to possible sensorineural hearing loss, blue vision, but will have him discuss this medicine with his cardiologist to make sure it is safe for him to take these. If cardiologist ok's this, I can send in a low dose trial, but can rtc to discuss further.   Need for pneumococcal vaccination, Need for prophylactic vaccination against Streptococcus pneumoniae (pneumococcus) - Prevnar as above.   RTC precautions.   There are other unrelated non-urgent complaints, but time does not permit me to address these routine issues at today's visit. I've requested another appointment to review these additional issues. Plan on recheck in next 4-6 weeks.      Meds ordered this encounter  Medications  . cetirizine (ZYRTEC) 10 MG tablet    Sig: Take 10 mg by mouth daily.  Marland Kitchen OVER THE COUNTER  MEDICATION    Sig: Sleep apnea machine  . Apoaequorin (PREVAGEN PO)    Sig: Take by mouth.  . Glucos-Chondroit-Hyaluron-MSM (GLUCOSAMINE CHONDROITIN JOINT PO)    Sig: Take by mouth.  . omega-3 fish oil (MAXEPA) 1000 MG CAPS capsule    Sig: Take by mouth.   Patient Instructions  Keep follow up with your other specialists as discussed.  If ankle swelling not improved into next week - return to discuss further.  We will refer you to ENT for the mass in your nose. See information below on pruritus ani - if itching not improving in next few weeks, or bleeding persists - discuss with gastroenterologist or return to discuss further.  Eye doctor offices I recommend: [facility], [place].  Other pneumonia vaccine given today.   Follow up with me in next 4-6 weeks to make sure we covered the concerns today and follow up some of what was discussed.   Return to the clinic or go to the nearest emergency room if any of your symptoms worsen or new symptoms occur.  Anal Pruritus Anal pruritus is an itching of the anus, which is often due to increased moisture of the skin around the anus. Moisture may be due to sweating or a small amount of remaining stool. The itching and scratching can cause further skin damage.  CAUSES   Poor hygiene.  Excessive moisture from sweating or residual stool in the anal  area.  Perfumed soaps and sprays and colored toilet paper.  Chemicals in the foods you eat.  Dietary factors such as caffeine, beer, milk products, chocolate, nuts, citrus fruits, tomatoes, spicy seasonings, jalapeno peppers, and salsa.  Hemorrhoids, infections, and other anal diseases.  Excessive washing.  Overuse of laxatives.  Skin disorders (psoriasis, eczema, or seborrhea). HOME CARE INSTRUCTIONS   Practice good hygiene.  Clean the anal area gently with wet toilet paper, baby wipes, or a wet washcloth after every bowel movement and at bedtime. Avoid using soaps on the anal area.  Dry the area thoroughly. Pat the area dry with toilet paper or a towel.  Do not scrub the anal area with anything, even toilet paper.  Try not to scratch the itchy area. Scratching produces more damage, which makes the itching worse.  Take sitz baths in warm water for 15 to 20 minutes, 2 to 3 times a day. Pat the area dry with a soft cloth after each bath.  Zinc oxide ointment or a moisture barrier cream can be applied several times daily to protect the skin.  Only take medicines as directed by your caregiver.  Talk to your caregiver about fiber supplements. These are helpful in normalizing the stool if you have frequent loose stools.  Wear cotton underwear and loose clothing.  Do not use irritants such as bubble baths, scented toilet paper, or genital deodorants. SEEK MEDICAL CARE IF:   Itching does not improve in several days or gets worse.  You have a fever.  There are problems with increased pain, swelling, or redness. MAKE SURE YOU:   Understand these instructions.  Will watch your condition.  Will get help right away if you are not doing well or get worse. Document Released: 08/16/2010 Document Revised: 05/09/2011 Document Reviewed: 08/16/2010 [facility] Patient Information 2015 [place], [place]. This information is not intended to replace advice given to you by your health care provider. Make sure you discuss any questions you have with your health care provider.   Keeping you healthy  Get these tests  Blood pressure- Have your blood pressure checked once a year by your healthcare provider.  Normal blood pressure is 120/80  Weight- Have your body mass index (BMI) calculated to screen for obesity.  BMI is a measure of body fat based on height and weight. You can also calculate your own BMI at ViewBank[employer].si.  Cholesterol- Have your cholesterol checked every year.  Diabetes- Have your blood sugar checked regularly if you have high blood pressure, high  cholesterol, have a family history of diabetes or if you are overweight.  Screening for Colon Cancer- Colonoscopy starting at age 51.  Screening may begin sooner depending on your family history and other health conditions. Follow up colonoscopy as directed by your Gastroenterologist.  Screening for Prostate Cancer- Both blood work (PSA) and a rectal exam help screen for Prostate Cancer.  Screening begins at age 42 with African-American men and at age 88 with Caucasian men.  Screening may begin sooner depending on your family history.  Take these medicines  Aspirin- One aspirin daily can help prevent Heart disease and Stroke.  Flu shot- Every fall.  Tetanus- Every 10 years.  Zostavax- Once after the age of 53 to prevent Shingles.  Pneumonia shot- Once after the age of 63; if you are younger than 33, ask your healthcare provider if you need a Pneumonia shot.  Take these steps  Don't smoke- If you do smoke, talk to your doctor about quitting.  For tips on how to quit, go to www.smokefree.gov or call 1-800-QUIT-NOW.  Be physically active- Exercise 5 days a week for at least 30 minutes.  If you are not already physically active start slow and gradually work up to 30 minutes of moderate physical activity.  Examples of moderate activity include walking briskly, mowing the yard, dancing, swimming, bicycling, etc.  Eat a healthy diet- Eat a variety of healthy food such as fruits, vegetables, low fat milk, low fat cheese, yogurt, lean meant, poultry, fish, beans, tofu, etc. For more information go to www.thenutritionsource.org  Drink alcohol in moderation- Limit alcohol intake to less than two drinks a day. Never drink and drive.  Dentist- Brush and floss twice daily; visit your dentist twice a year.  Depression- Your emotional health is as important as your physical health. If you're feeling down, or losing interest in things you would normally enjoy please talk to your healthcare  provider.  Eye exam- Visit your eye doctor every year.  Safe sex- If you may be exposed to a sexually transmitted infection, use a condom.  Seat belts- Seat belts can save your life; always wear one.  Smoke/Carbon Monoxide detectors- These detectors need to be installed on the appropriate level of your home.  Replace batteries at least once a year.  Skin cancer- When out in the sun, cover up and use sunscreen 15 SPF or higher.  Violence- If anyone is threatening you, please tell your healthcare provider.  Living Will/ Health care power of attorney- Speak with your healthcare provider and family.     I personally performed the services described in this documentation, which was scribed in my presence. The recorded information has been reviewed and considered, and addended by me as needed.

## 2013-09-16 NOTE — Patient Instructions (Signed)
Keep follow up with your other specialists as discussed.  If ankle swelling not improved into next week - return to discuss further.  We will refer you to ENT for the mass in your nose. See information below on pruritus ani - if itching not improving in next few weeks, or bleeding persists - discuss with gastroenterologist or return to discuss further.  Eye doctor offices I recommend: [facility], [place].  Other pneumonia vaccine given today.   Follow up with me in next 4-6 weeks to make sure we covered the concerns today and follow up some of what was discussed.   Return to the clinic or go to the nearest emergency room if any of your symptoms worsen or new symptoms occur.  Anal Pruritus Anal pruritus is an itching of the anus, which is often due to increased moisture of the skin around the anus. Moisture may be due to sweating or a small amount of remaining stool. The itching and scratching can cause further skin damage.  CAUSES   Poor hygiene.  Excessive moisture from sweating or residual stool in the anal area.  Perfumed soaps and sprays and colored toilet paper.  Chemicals in the foods you eat.  Dietary factors such as caffeine, beer, milk products, chocolate, nuts, citrus fruits, tomatoes, spicy seasonings, jalapeno peppers, and salsa.  Hemorrhoids, infections, and other anal diseases.  Excessive washing.  Overuse of laxatives.  Skin disorders (psoriasis, eczema, or seborrhea). HOME CARE INSTRUCTIONS   Practice good hygiene.  Clean the anal area gently with wet toilet paper, baby wipes, or a wet washcloth after every bowel movement and at bedtime. Avoid using soaps on the anal area. Dry the area thoroughly. Pat the area dry with toilet paper or a towel.  Do not scrub the anal area with anything, even toilet paper.  Try not to scratch the itchy area. Scratching produces more damage, which makes the itching worse.  Take sitz baths in warm water for 15 to 20  minutes, 2 to 3 times a day. Pat the area dry with a soft cloth after each bath.  Zinc oxide ointment or a moisture barrier cream can be applied several times daily to protect the skin.  Only take medicines as directed by your caregiver.  Talk to your caregiver about fiber supplements. These are helpful in normalizing the stool if you have frequent loose stools.  Wear cotton underwear and loose clothing.  Do not use irritants such as bubble baths, scented toilet paper, or genital deodorants. SEEK MEDICAL CARE IF:   Itching does not improve in several days or gets worse.  You have a fever.  There are problems with increased pain, swelling, or redness. MAKE SURE YOU:   Understand these instructions.  Will watch your condition.  Will get help right away if you are not doing well or get worse. Document Released: 08/16/2010 Document Revised: 05/09/2011 Document Reviewed: 08/16/2010 [facility] Patient Information 2015 [place], [place]. This information is not intended to replace advice given to you by your health care provider. Make sure you discuss any questions you have with your health care provider.   Keeping you healthy  Get these tests  Blood pressure- Have your blood pressure checked once a year by your healthcare provider.  Normal blood pressure is 120/80  Weight- Have your body mass index (BMI) calculated to screen for obesity.  BMI is a measure of body fat based on height and weight. You can also calculate your own BMI at ViewBanking.si.  Cholesterol- Have your cholesterol checked every year.  Diabetes- Have your blood sugar checked regularly if you have high blood pressure, high cholesterol, have a family history of diabetes or if you are overweight.  Screening for Colon Cancer- Colonoscopy starting at age 54.  Screening may begin sooner depending on your family history and other health conditions. Follow up colonoscopy as directed by your  Gastroenterologist.  Screening for Prostate Cancer- Both blood work (PSA) and a rectal exam help screen for Prostate Cancer.  Screening begins at age 27 with African-American men and at age 76 with Caucasian men.  Screening may begin sooner depending on your family history.  Take these medicines  Aspirin- One aspirin daily can help prevent Heart disease and Stroke.  Flu shot- Every fall.  Tetanus- Every 10 years.  Zostavax- Once after the age of 63 to prevent Shingles.  Pneumonia shot- Once after the age of 67; if you are younger than 33, ask your healthcare provider if you need a Pneumonia shot.  Take these steps  Don't smoke- If you do smoke, talk to your doctor about quitting.  For tips on how to quit, go to www.smokefree.gov or call 1-800-QUIT-NOW.  Be physically active- Exercise 5 days a week for at least 30 minutes.  If you are not already physically active start slow and gradually work up to 30 minutes of moderate physical activity.  Examples of moderate activity include walking briskly, mowing the yard, dancing, swimming, bicycling, etc.  Eat a healthy diet- Eat a variety of healthy food such as fruits, vegetables, low fat milk, low fat cheese, yogurt, lean meant, poultry, fish, beans, tofu, etc. For more information go to www.thenutritionsource.org  Drink alcohol in moderation- Limit alcohol intake to less than two drinks a day. Never drink and drive.  Dentist- Brush and floss twice daily; visit your dentist twice a year.  Depression- Your emotional health is as important as your physical health. If you're feeling down, or losing interest in things you would normally enjoy please talk to your healthcare provider.  Eye exam- Visit your eye doctor every year.  Safe sex- If you may be exposed to a sexually transmitted infection, use a condom.  Seat belts- Seat belts can save your life; always wear one.  Smoke/Carbon Monoxide detectors- These detectors need to be installed on  the appropriate level of your home.  Replace batteries at least once a year.  Skin cancer- When out in the sun, cover up and use sunscreen 15 SPF or higher.  Violence- If anyone is threatening you, please tell your healthcare provider.  Living Will/ Health care power of attorney- Speak with your healthcare provider and family.

## 2013-09-19 ENCOUNTER — Other Ambulatory Visit: Payer: Self-pay | Admitting: Cardiology

## 2013-09-19 ENCOUNTER — Other Ambulatory Visit: Payer: Self-pay | Admitting: *Deleted

## 2013-09-19 ENCOUNTER — Other Ambulatory Visit: Payer: Self-pay | Admitting: Gastroenterology

## 2013-09-19 MED ORDER — DABIGATRAN ETEXILATE MESYLATE 150 MG PO CAPS: 150.0000 mg | ORAL_CAPSULE | Freq: Two times a day (BID) | ORAL | Status: DC

## 2013-09-19 NOTE — Telephone Encounter (Signed)
PT WALKED IN FOR REFILL

## 2013-09-23 ENCOUNTER — Encounter: Payer: Self-pay | Admitting: *Deleted

## 2013-09-25 ENCOUNTER — Encounter: Payer: Self-pay | Admitting: Gastroenterology

## 2013-09-25 ENCOUNTER — Ambulatory Visit (INDEPENDENT_AMBULATORY_CARE_PROVIDER_SITE_OTHER): Payer: Medicare PPO | Admitting: Gastroenterology

## 2013-09-25 VITALS — BP 118/86 | HR 64 | Ht 69.5 in | Wt 185.5 lb

## 2013-09-25 DIAGNOSIS — K2270 Barrett's esophagus without dysplasia: Secondary | ICD-10-CM

## 2013-09-25 DIAGNOSIS — K921 Melena: Secondary | ICD-10-CM

## 2013-09-25 MED ORDER — OMEPRAZOLE 20 MG PO CPDR: DELAYED_RELEASE_CAPSULE | ORAL | Status: DC

## 2013-09-25 NOTE — Progress Notes (Signed)
    History of Present Illness: This is a 73 year old male with short segment Barrett's esophagus. His reflux symptoms are under very good control on omeprazole 20 mg daily with surveillance EGD planned for a five-year interval. For about 2 years he had intermittent small amounts of blood noted on the tissue paper when wiping and occasional rectal itching his symptoms only lasted for a day at a time when they occurred and the last time symptoms occurred were about 6-8 months ago. He has no other GI complaints. He underwent colonoscopy and EGD in 07/2009. Colonoscopy was normal and EGD showed short segment Barrett's. Noted mild hematochezia and rectal itching several times about 6 months ago but none since.   Current Medications, Allergies, Past Medical History, Past Surgical History, Family History and Social History were reviewed in [employer] record.  Physical Exam: General: Well developed , well nourished, no acute distress Head: Normocephalic and atraumatic Eyes:  sclerae anicteric, EOMI Ears: Normal auditory acuity Mouth: No deformity or lesions Lungs: Clear throughout to auscultation Heart: Regular rate and rhythm; no murmurs, rubs or bruits Abdomen: Soft, non tender and non distended. No masses, hepatosplenomegaly or hernias noted. Normal Bowel sounds Musculoskeletal: Symmetrical with no gross deformities  Pulses:  Normal pulses noted Extremities: No clubbing, cyanosis, edema or deformities noted Neurological: Alert oriented x 4, grossly nonfocal Psychological:  Alert and cooperative. Normal mood and affect  Assessment and Recommendations:  1. Barrett's esophagus. Continue standard antireflux measures and omeprazole 20 mg daily. Surveillance endoscopy recommended for 5 years in June 2016. He has a history of difficult intubation so was scheduled the hospital.  2. Minor hematochezia and rectal itching which resolved 6-8 months ago. Suspected hemorrhoidal  symptoms. OTC Prep H supp daily as needed. If these symptoms are frequently recurrent consider colonoscopy.

## 2013-09-25 NOTE — Patient Instructions (Signed)
We have sent the following medications to your pharmacy for you to pick up at your convenience:Omeprazole.   Thank you for choosing me and [facility].  Malcolm T. Stark, Jr., MD., FACG   

## 2013-09-27 ENCOUNTER — Ambulatory Visit: Payer: Medicare PPO | Admitting: Gastroenterology

## 2013-10-02 ENCOUNTER — Other Ambulatory Visit: Payer: Self-pay | Admitting: Otolaryngology

## 2013-10-04 ENCOUNTER — Telehealth: Payer: Self-pay

## 2013-10-04 NOTE — Telephone Encounter (Signed)
Pt states he has an appointment with [facility] today at 9:30 Pt states in order for his insurance to cover it, he will need a "referral" from  Dr Carlota Raspberry inorder for the insurance to cover Please call pt to advise

## 2013-10-04 NOTE — Telephone Encounter (Signed)
Lm for rtn call.  What is he going to the eye dr for?  We have not addressed any eye issues at recent OV's. He did have a CPE with Dr. Carlota Raspberry in July. Will forward to Dr. Carlota Raspberry when pt calls back with reason for visit.

## 2013-10-04 NOTE — Telephone Encounter (Signed)
This issue has been adressed. Per ins. He does not need a referral

## 2013-10-16 ENCOUNTER — Other Ambulatory Visit: Payer: Self-pay | Admitting: Physician Assistant

## 2013-10-16 ENCOUNTER — Other Ambulatory Visit: Payer: Self-pay | Admitting: Cardiology

## 2013-11-11 ENCOUNTER — Ambulatory Visit (INDEPENDENT_AMBULATORY_CARE_PROVIDER_SITE_OTHER): Payer: Medicare PPO | Admitting: Family Medicine

## 2013-11-11 ENCOUNTER — Encounter: Payer: Self-pay | Admitting: Family Medicine

## 2013-11-11 VITALS — BP 130/92 | HR 75 | Temp 97.7°F | Resp 16 | Ht 69.5 in | Wt 183.0 lb

## 2013-11-11 DIAGNOSIS — L290 Pruritus ani: Secondary | ICD-10-CM

## 2013-11-11 DIAGNOSIS — Z23 Encounter for immunization: Secondary | ICD-10-CM

## 2013-11-11 DIAGNOSIS — R220 Localized swelling, mass and lump, head: Secondary | ICD-10-CM

## 2013-11-11 DIAGNOSIS — N529 Male erectile dysfunction, unspecified: Secondary | ICD-10-CM

## 2013-11-11 DIAGNOSIS — R221 Localized swelling, mass and lump, neck: Secondary | ICD-10-CM

## 2013-11-11 DIAGNOSIS — C642 Malignant neoplasm of left kidney, except renal pelvis: Secondary | ICD-10-CM | POA: Insufficient documentation

## 2013-11-11 DIAGNOSIS — J3489 Other specified disorders of nose and nasal sinuses: Secondary | ICD-10-CM

## 2013-11-11 DIAGNOSIS — G4700 Insomnia, unspecified: Secondary | ICD-10-CM

## 2013-11-11 MED ORDER — ZOLPIDEM TARTRATE 5 MG PO TABS: 2.5000 mg | ORAL_TABLET | Freq: Every evening | ORAL | Status: DC | PRN

## 2013-11-11 NOTE — Patient Instructions (Signed)
Follow up in next 6 months.   Start with 1/2 of lower dose of ambien, but up to full pill if needed.   Discuss viagra or levitra and safety in using this with your cardiologist at upcoming visit.   Return to the clinic or go to the nearest emergency room if any of your symptoms worsen or new symptoms occur.

## 2013-11-11 NOTE — Progress Notes (Signed)
Subjective:    Patient ID: [name], male    DOB: September 20, 1940, 73 y.o.   MRN: [id number] This chart was scribed for [name] Agreste, MD by Cathie Hoops, ED Scribe. The patient was seen in Room 28. The patient's care was started at 3:38 PM.   11/11/2013  Follow-up  HPI HPI Comments: [name] is a 73 y.o. male who presents to the [facility] for a follow-up visit.  Pt is currently doing well.   Last seen by me for a physical on July 20, multiple concerns addressed to me at that visit, see details. Requested a  follow-up appointment to further address concerns from physical.  1.) Right Ankle Swelling without note of injury Pt denies any symptoms at this time. Pt has been wearing a bandage around his second right hammer toe.  2.) Pruritus ani  Pt denies any symptoms at this time.  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   4.) Ambien Pt takes half of a 10 mg tablet of Ambien and is currently running out. Pt denies sleepwalking or trouble waking up with strange behaviors. Pt denies any adverse effects.   5.) Viagra Pt has a scheduled appointment with cardiology in 3 weeks to discuss possible Viagra usage.  Review of Systems  All other systems reviewed and are negative.   Patient Active Problem List   Diagnosis Date Noted  . BPH (benign prostatic hyperplasia) 11/07/2011  . Hip pain 07/04/2011  . Perianal itch 07/04/2011  . OSA (obstructive sleep apnea) 08/08/2010  . Chronic obstructive asthma 07/14/2010  . Rhinitis 07/14/2010  . Cough 07/13/2010  . BARRETT'S ESOPHAGUS 03/11/2009  . PAROXYSMAL ATRIAL FIBRILLATION 07/29/2008  . GERD 07/29/2008   Past Medical History  Diagnosis Date  . Atrial fibrillation   . Barrett's esophagus   . Paroxysmal atrial fibrillation     Hospitalized on 05/27/10  . GERD (gastroesophageal reflux disease)   . BPH (benign prostatic hyperplasia)   .  Barrett esophagus   . Hiatal hernia   . Renal cell carcinoma 2009    right renal mass, followed by Dr. Ander Slade  . OSA (obstructive sleep apnea) 08/05/10 [facility]    AHI 24  . Chronic obstructive asthma   . Difficult intubation   . Shortness of breath   . Allergy    Past Surgical History  Procedure Laterality Date  . Exploratory laparotomy, segmental small bowel resection of a hemangioma, repair of incarcerated umbelical hernia  00/1749  . Exploratory laparotomy w/ bowel resection  12/1999    segmental sbr of a hemangioma, repair of incarcerated umbilical hernia  . Renal cancer surgery with rf ablation    . Tonsillectomy    . Basal cell carcinoma excision    . Eye surgery     Allergies  Allergen Reactions  . Proair Hfa [Albuterol]    Prior to Admission medications   Medication Sig Start Date End Date Taking? Authorizing Provider  albuterol (PROAIR HFA) 108 (90 BASE) MCG/ACT inhaler Inhale 2 puffs into the lungs every 6 (six) hours as needed for wheezing or shortness of breath. 05/24/13  Yes [name], MD  AMBULATORY NON FORMULARY MEDICATION CPAP at night   Yes Historical Provider, MD  beclomethasone (QVAR) 80 MCG/ACT inhaler Inhale 1 puff into the lungs 2 (two) times daily. 11/28/12  Yes Vineet Sood, MD  CARTIA XT 180 MG 24 hr capsule TAKE 1 CAPSULE  BY MOUTH DAILY 10/17/13  Yes [name], MD  cetirizine (ZYRTEC) 10 MG tablet Take 10 mg by mouth as needed.    Yes Historical Provider, MD  dutasteride (AVODART) 0.5 MG capsule Take 0.5 mg by mouth daily.     Yes Historical Provider, MD  fluticasone (FLONASE) 50 MCG/ACT nasal spray INSTILL 2 SPRAYS IN EACH NOSTRIL EVERY DAY AS DIRECTED 05/24/13  Yes [name], MD  Glucos-Chondroit-Hyaluron-MSM (GLUCOSAMINE CHONDROITIN JOINT PO) Take by mouth.   Yes Historical Provider, MD  metoprolol succinate (TOPROL-XL) 50 MG 24 hr tablet TAKE 1 TABLET BY MOUTH EVERY DAY WITH OR FOLLOWING A MEAL 10/17/13  Yes [name], MD  Multiple Vitamin  (MULTIVITAMIN) capsule Take 1 capsule by mouth daily. Twice/week   Yes Historical Provider, MD  omeprazole (PRILOSEC) 20 MG capsule TAKE ONE CAPSULE BY MOUTH EVERY DAY 09/25/13  Yes [name], MD  OVER THE COUNTER MEDICATION Sleep apnea machine   Yes Historical Provider, MD  PRADAXA 150 MG CAPS capsule TAKE ONE CAPSULE BY MOUTH TWICE DAILY 10/17/13  Yes [name], MD  zolpidem (AMBIEN) 10 MG tablet Take 5 mg by mouth at bedtime as needed. For anxiety/insomnia 11/19/12  Yes Orma Flaming, MD  Apoaequorin (PREVAGEN PO) Take 1 tablet by mouth daily.     Historical Provider, MD  omega-3 fish oil (MAXEPA) 1000 MG CAPS capsule Take by mouth.    Historical Provider, MD   History   Social History  . Marital Status: Single    Spouse Name: N/A    Number of Children: N/A  . Years of Education: N/A   Occupational History  . tv producer/construction   . televison Secretary/administrator    Social History Main Topics  . Smoking status: Former Smoker -- 10 years    Types: Cigars    Quit date: 02/29/1980  . Smokeless tobacco: Never Used     Comment: quit when he was 73 years old  . Alcohol Use: No  . Drug Use: No  . Sexual Activity: Not Currently   Other Topics Concern  . Not on file   Social History Narrative   ** Merged History Encounter ** Married. Education: [employer]. Exercise: Eleptical times a week for 45 minutes.            Objective:  Triage Vitals: BP 130/92  Pulse 75  Temp(Src) 97.7 F (36.5 C) (Oral)  Resp 16  Ht 5' 9.5" (1.765 m)  Wt 183 lb (83.008 kg)  BMI 26.65 kg/m2  SpO2 98%  Physical Exam  Vitals reviewed. Constitutional: He is oriented to person, place, and time. He appears well-developed and well-nourished.  HENT:  Head: Atraumatic.  Nose: Nose normal.  No masses visualized inside the nares.   Eyes: EOM are normal. Pupils are equal, round, and reactive to light.  Neck: No JVD present. Carotid bruit is not present.  Cardiovascular: Normal rate, regular rhythm and  normal heart sounds.   No murmur heard. Pulmonary/Chest: Effort normal and breath sounds normal. He has no rales.  Musculoskeletal: He exhibits no edema.  No ankle edema and is non-tender.  Neurological: He is alert and oriented to person, place, and time.  Skin: Skin is warm and dry.  Psychiatric: He has a normal mood and affect.    Assessment & Plan:  3:51 PM- Patient informed of current plan for treatment and evaluation and agrees with plan at this time. LEHI PHIFER is a 73 y.o. male Need for prophylactic vaccination  and inoculation against influenza - Plan: Flu Vaccine QUAD 36+ mos IM given.   Insomnia - Plan: zolpidem (AMBIEN) 5 MG tablet  - chronic, intermittent and has tolerated low dose of Ambien for some time without side effects or parasomnias. Advised to try 1/2 of 5mg  dose, but can take up to 5mg  qhs prn.   Pruritus ani - improved.  Discussed continued avoidance of trigger foods and local sx care if recurs.   Erectile dysfunction, unspecified erectile dysfunction type - plans to discuss viagra or levitra safety with upcoming cardiology appointment.   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  By appearance of path report, appeared to be benign.   Recheck in 6 months.   Meds ordered this encounter  Medications  . zolpidem (AMBIEN) 5 MG tablet    Sig: Take 0.5-1 tablets (2.5-5 mg total) by mouth at bedtime as needed. For anxiety/insomnia    Dispense:  30 tablet    Refill:  0   Patient Instructions  Follow up in next 6 months.   Start with 1/2 of lower dose of ambien, but up to full pill if needed.   Discuss viagra or levitra and safety in using this with your cardiologist at upcoming visit.   Return to the clinic or go to the nearest emergency room if any of your symptoms worsen or new symptoms occur.   I personally performed the services described in this documentation, which was scribed in my presence. The recorded information has been reviewed and  considered, and addended by me as needed.

## 2013-11-12 ENCOUNTER — Other Ambulatory Visit: Payer: Self-pay | Admitting: Cardiology

## 2013-11-14 ENCOUNTER — Other Ambulatory Visit: Payer: Self-pay | Admitting: *Deleted

## 2013-11-14 MED ORDER — METOPROLOL SUCCINATE ER 50 MG PO TB24: ORAL_TABLET | ORAL | Status: DC

## 2013-11-14 MED ORDER — DILTIAZEM HCL ER COATED BEADS 180 MG PO CP24: ORAL_CAPSULE | ORAL | Status: DC

## 2013-11-19 ENCOUNTER — Other Ambulatory Visit: Payer: Self-pay | Admitting: Internal Medicine

## 2013-11-25 ENCOUNTER — Telehealth: Payer: Self-pay | Admitting: Cardiology

## 2013-11-25 ENCOUNTER — Encounter: Payer: Medicare PPO | Admitting: Family Medicine

## 2013-11-25 NOTE — Telephone Encounter (Signed)
Pt wants to know if he needs to fast when he come in for his appt tomorrow?

## 2013-11-25 NOTE — Telephone Encounter (Signed)
Returned call to patient no answer.Left message on personal voice mail appointment with Dr.Jordan tomorrow 11/26/13 is at 3:30 pm too late to fast.We can give you lab orders to go another day.

## 2013-11-26 ENCOUNTER — Ambulatory Visit (INDEPENDENT_AMBULATORY_CARE_PROVIDER_SITE_OTHER): Payer: Medicare PPO | Admitting: Cardiology

## 2013-11-26 ENCOUNTER — Encounter: Payer: Self-pay | Admitting: Cardiology

## 2013-11-26 VITALS — BP 140/95 | HR 78 | Ht 70.0 in | Wt 183.4 lb

## 2013-11-26 DIAGNOSIS — I482 Chronic atrial fibrillation, unspecified: Secondary | ICD-10-CM

## 2013-11-26 DIAGNOSIS — I4891 Unspecified atrial fibrillation: Secondary | ICD-10-CM

## 2013-11-26 MED ORDER — DABIGATRAN ETEXILATE MESYLATE 150 MG PO CAPS: 150.0000 mg | ORAL_CAPSULE | Freq: Two times a day (BID) | ORAL | Status: DC

## 2013-11-26 MED ORDER — DILTIAZEM HCL ER COATED BEADS 180 MG PO CP24: 180.0000 mg | ORAL_CAPSULE | Freq: Every day | ORAL | Status: DC

## 2013-11-26 MED ORDER — METOPROLOL SUCCINATE ER 50 MG PO TB24: 50.0000 mg | ORAL_TABLET | Freq: Every day | ORAL | Status: DC

## 2013-11-26 NOTE — Progress Notes (Signed)
Sarita Haver Date of Birth: 1940/09/16 Medical Record #[id number]  History of Present Illness: Mr. Colocho is seen today to establish care. He is a former patient of Dr. Verl Blalock. He has a history of atrial fibrillation dating back to 2012. Initially this was paroxysmal but multiple Ecgs since then show Afib with controlled rate. He is managed with metoprolol, diltiazem, and pradaxa. Typical HR is 60-90. When he works out in gym [place] may go up to 155-165. He denies any chest pain, SOB, or palpitations. No dizziness. Needs Rx refilled.     Medication List       This list is accurate as of: 11/26/13  5:57 PM.  Always use your most recent med list.               albuterol 108 (90 BASE) MCG/ACT inhaler  Commonly known as:  PROAIR HFA  Inhale 2 puffs into the lungs every 6 (six) hours as needed for wheezing or shortness of breath.     AMBULATORY NON FORMULARY MEDICATION  CPAP at night     AVODART 0.5 MG capsule  Generic drug:  dutasteride  Take 0.5 mg by mouth daily.     beclomethasone 80 MCG/ACT inhaler  Commonly known as:  QVAR  Inhale 1 puff into the lungs 2 (two) times daily.     cetirizine 10 MG tablet  Commonly known as:  ZYRTEC  Take 10 mg by mouth as needed.     dabigatran 150 MG Caps capsule  Commonly known as:  PRADAXA  Take 1 capsule (150 mg total) by mouth 2 (two) times daily.     diltiazem 180 MG 24 hr capsule  Commonly known as:  CARTIA XT  Take 1 capsule (180 mg total) by mouth daily. TAKE 1 CAPSULE BY MOUTH DAILY     fluticasone 50 MCG/ACT nasal spray  Commonly known as:  FLONASE  INSTILL 2 SPRAYS IN EACH NOSTRIL EVERY DAY AS DIRECTED     GLUCOSAMINE CHONDROITIN JOINT PO  Take by mouth.     metoprolol succinate 50 MG 24 hr tablet  Commonly known as:  TOPROL-XL  Take 1 tablet (50 mg total) by mouth daily. TAKE 1 TABLET BY MOUTH EVERY DAY WITH OR FOLLOWING A MEAL     multivitamin capsule  Take 1 capsule by mouth daily. Twice/week     omega-3 fish oil 1000  MG Caps capsule  Commonly known as:  MAXEPA  Take by mouth.     omeprazole 20 MG capsule  Commonly known as:  PRILOSEC  TAKE ONE CAPSULE BY MOUTH EVERY DAY     OVER THE COUNTER MEDICATION  Sleep apnea machine     PREVAGEN PO  Take 1 tablet by mouth daily.     zolpidem 5 MG tablet  Commonly known as:  AMBIEN  Take 0.5-1 tablets (2.5-5 mg total) by mouth at bedtime as needed. For anxiety/insomnia         Allergies  Allergen Reactions  . Proair Hfa [Albuterol]     Past Medical History  Diagnosis Date  . Atrial fibrillation   . Barrett's esophagus   . Paroxysmal atrial fibrillation     Hospitalized on 05/27/10  . GERD (gastroesophageal reflux disease)   . BPH (benign prostatic hyperplasia)   . Barrett esophagus   . Hiatal hernia   . Renal cell carcinoma 2009    right renal mass, followed by Dr. Ander Slade  . OSA (obstructive sleep apnea) 08/05/10 [facility]    AHI  24  . Chronic obstructive asthma   . Difficult intubation   . Shortness of breath   . Allergy     Past Surgical History  Procedure Laterality Date  . Exploratory laparotomy, segmental small bowel resection of a hemangioma, repair of incarcerated umbelical hernia  32/9518  . Exploratory laparotomy w/ bowel resection  12/1999    segmental sbr of a hemangioma, repair of incarcerated umbilical hernia  . Renal cancer surgery with rf ablation    . Tonsillectomy    . Basal cell carcinoma excision    . Eye surgery      History   Social History  . Marital Status: Single    Spouse Name: N/A    Number of Children: N/A  . Years of Education: N/A   Occupational History  . tv producer/construction   . televison Secretary/administrator    Social History Main Topics  . Smoking status: Former Smoker -- 10 years    Types: Cigars    Quit date: 02/29/1980  . Smokeless tobacco: Never Used     Comment: quit when he was 73 years old  . Alcohol Use: No  . Drug Use: No  . Sexual Activity: Not Currently   Other Topics Concern  .  None   Social History Narrative   ** Merged History Encounter ** Married. Education: [employer]. Exercise: Eleptical times a week for 45 minutes.           Family History  Problem Relation Age of Onset  . Stroke Mother   . Diabetes Mother   . Obesity Brother   . Diabetes Brother   . Asthma Paternal Grandmother   . Heart attack Paternal Grandfather   . Kidney cancer Paternal Aunt   . Colon cancer Neg Hx   . Diabetes Father     Review of Systems: As noted in HPI.  All other systems were reviewed and are negative.  Physical Exam: BP 140/95  Pulse 78  Ht 5\' 10"  (1.778 m)  Wt 183 lb 6.4 oz (83.19 kg)  BMI 26.32 kg/m2 Filed Weights   11/26/13 1555  Weight: 183 lb 6.4 oz (83.19 kg)   GENERAL:  Well appearing WM in NAD HEENT:  PERRL, EOMI, sclera are clear. Oropharynx is clear. NECK:  No jugular venous distention, carotid upstroke brisk and symmetric, no bruits, no thyromegaly or adenopathy LUNGS:  Clear to auscultation bilaterally CHEST:  Unremarkable HEART:  IRRR,  PMI not displaced or sustained,S1 and S2 within normal limits, no S3, no S4: no clicks, no rubs, no murmurs ABD:  Soft, nontender. BS +, no masses or bruits. No hepatomegaly, no splenomegaly EXT:  2 + pulses throughout, no edema, no cyanosis no clubbing SKIN:  Warm and dry.  No rashes NEURO:  Alert and oriented x 3. Cranial nerves II through XII intact. PSYCH:  Cognitively intact   LABORATORY DATA:   Assessment / Plan: 1. Atrial fibrillation. Appears to be permanent now. Rate well controlled. Continue rate control medications and anticoagulation. I will follow up in one year.

## 2013-11-26 NOTE — Patient Instructions (Signed)
Continue your current therapy  I will see you in one year   

## 2013-12-26 ENCOUNTER — Other Ambulatory Visit: Payer: Self-pay | Admitting: Pulmonary Disease

## 2014-02-11 ENCOUNTER — Telehealth: Payer: Self-pay | Admitting: Pulmonary Disease

## 2014-02-11 MED ORDER — BECLOMETHASONE DIPROPIONATE 80 MCG/ACT IN AERS: 1.0000 | INHALATION_SPRAY | Freq: Two times a day (BID) | RESPIRATORY_TRACT | Status: DC

## 2014-02-11 NOTE — Telephone Encounter (Signed)
Pt aware that he needs OV with VS. This has been made for 02/13/14 at 9:15am. Refill has been sent in.

## 2014-02-11 NOTE — Telephone Encounter (Signed)
Pt needs OV. Left message for pt to call back.

## 2014-02-13 ENCOUNTER — Encounter: Payer: Self-pay | Admitting: Pulmonary Disease

## 2014-02-13 ENCOUNTER — Ambulatory Visit (INDEPENDENT_AMBULATORY_CARE_PROVIDER_SITE_OTHER): Payer: Medicare PPO | Admitting: Pulmonary Disease

## 2014-02-13 VITALS — BP 138/72 | HR 74 | Temp 97.8°F | Ht 70.0 in | Wt 179.8 lb

## 2014-02-13 DIAGNOSIS — G4733 Obstructive sleep apnea (adult) (pediatric): Secondary | ICD-10-CM

## 2014-02-13 DIAGNOSIS — J3089 Other allergic rhinitis: Secondary | ICD-10-CM

## 2014-02-13 DIAGNOSIS — J449 Chronic obstructive pulmonary disease, unspecified: Secondary | ICD-10-CM

## 2014-02-13 NOTE — Patient Instructions (Signed)
Can try changing Qvar to one puff at night time Follow up in 1 year

## 2014-02-13 NOTE — Progress Notes (Signed)
   Chief Complaint  Patient presents with  . Follow-up    Pt reports good tolerance of CPAP--has been traveling the past 2.5 months and did not take his machine with him. No breathing issues present.    History of Present Illness: [name] is a 73 y.o. male with COPD with asthma, rhinitis and OSA.  His breathing and sinus have been doing well.  He does not use flonase all the time.  He is using Qvar bid.  He does not use albuterol much.  He is not having cough, wheeze, or sputum.  He goes to the gym for 50 minutes 3 times per week >> no breathing problems when he exercises.  He is not having any trouble with his CPAP.  He does not always take this with him when he travels.  Tests: PSG 08/05/10>>AHI 24.2, SpO2 low 77%, PLMI 15.8. PFT 07/26/11>>FEV1 2.62(91%), FEV1% 66, TLC 6.55(101%), DLCO 94%, +BD PSG with oral appliance 02/07/12>>AHI 11.5, SpO2 low 78%. Both obstructive and central events. Auto CPAP 01/14/14 to 02/12/14 >> used on 17 of 30 nights with average 4 hrs and 55 min.  Average AHI is 11 with median CPAP 7 cm H2O and 95 th percentile CPAP 9 cm H20.  Mostly central events.   PMHx >> Atrial fibrillation, Barrett esophagus, GERD, BPH, hiatal hernia, Renal cell carcinoma 2009  PSHx, Medications, Allergies, Fhx, Shx reviewed.  Physical Exam: Blood pressure 138/72, pulse 74, temperature 97.8 F (36.6 C), temperature source Oral, height 5\' 10"  (1.778 m), weight 179 lb 12.8 oz (81.557 kg), SpO2 99 %. Body mass index is 25.8 kg/(m^2).  General - No distress ENT - No sinus tenderness, decreased AP diameter oropharynx, no oral exudate, no LAN Cardiac - s1s2 regular, no murmur Chest - No wheeze/rales/dullness, good air entry, normal respiratory excursion Back - No focal tenderness Abd - Soft, non-tender Ext - No edema Neuro - Normal strength Skin - No rashes Psych - Normal mood, and behavior   Assessment/Plan:  COPD with asthma. Plan: - advised he could try decrease  Qvar to one puff qhs - continue prn albuterol  OSA >> he is compliant with CPAP and reports benefit. Plan: - continue auto CPAP  Upper airway cough syndrome with PND and GERD. Plan: - continue prn flonase, zyrtec  [name], MD [facility] Pager:  [phone] 02/13/2014, 9:30 AM

## 2014-03-17 ENCOUNTER — Ambulatory Visit: Payer: Medicare PPO | Admitting: Family Medicine

## 2014-04-14 IMAGING — CT CT ABD-PEL WO/W CM
2 of 12 series · 13 of 46 positions shown, 18 images · IV contrast (WATER)
Comparison: Abdominal MRI 11/28/2007.  Images from CT biopsy
12/12/2007.

CLINICAL DATA: Follow-up right renal cell carcinoma status post
biopsy 12/12/2007.  No reported specific treatment.

CT ABDOMEN AND PELVIS WITHOUT AND WITH CONTRAST
TECHNIQUE: Multidetector CT imaging of the abdomen and pelvis was
performed without contrast material in one or both body regions,
followed by contrast material(s) and further sections in one or
both body regions.
Contrast: 100mL OMNIPAQUE IOHEXOL 300 MG/ML  SOLN

[Series 4: arterial/portal venous · axial · arterial · 0.82mm/px · z∈[-402,+6]mm · 12 of 292 slices shown, 16 images]
[im 16/292  soft-tissue]
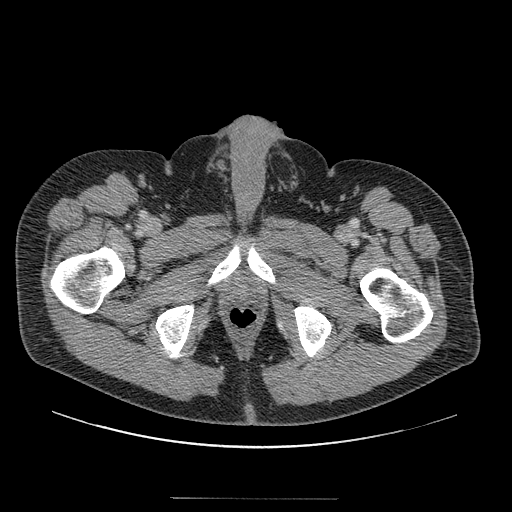
[im 16/292  bone]
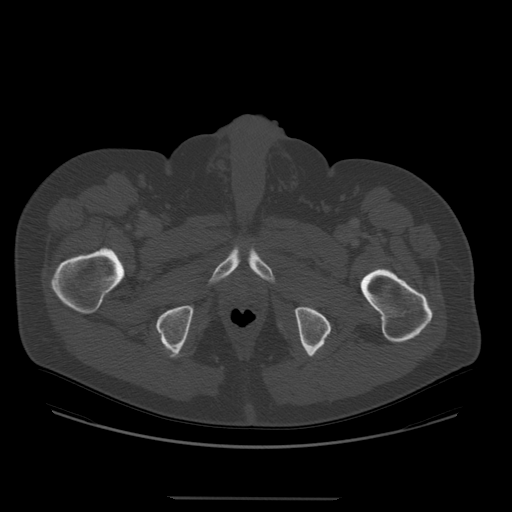
[im 46/292  soft-tissue]
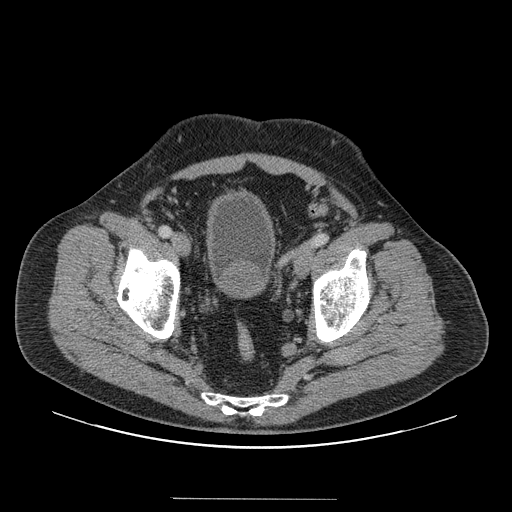
[im 77/292  soft-tissue]
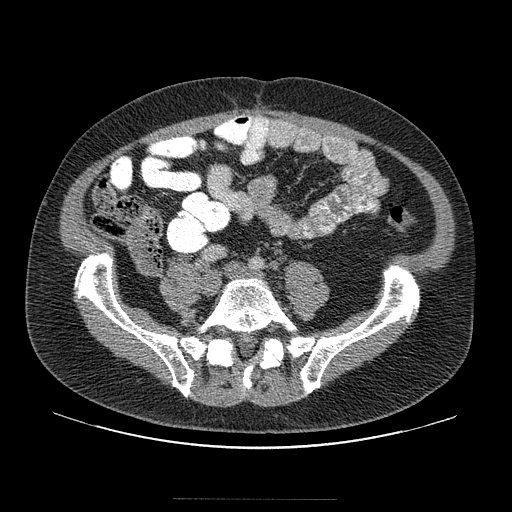
[im 108/292  soft-tissue]
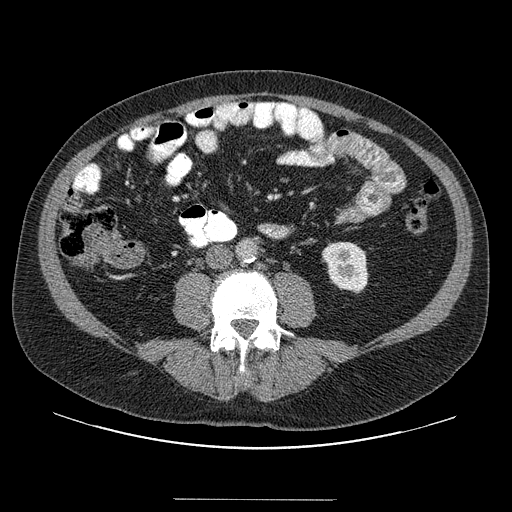
[im 138/292  soft-tissue]
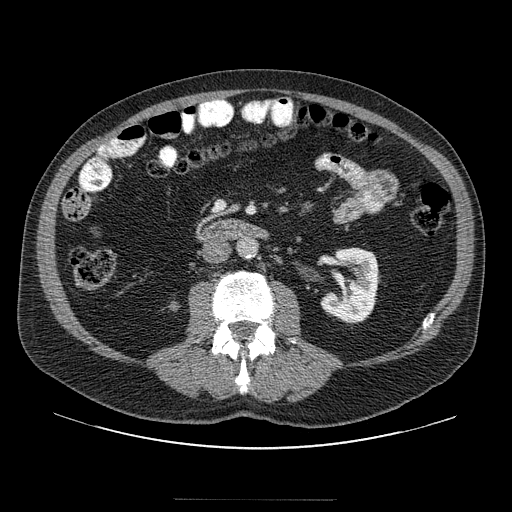
[im 154/292  soft-tissue]
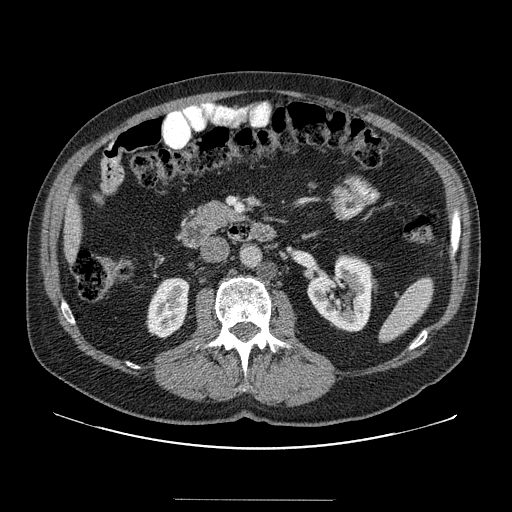
[im 184/292  soft-tissue]
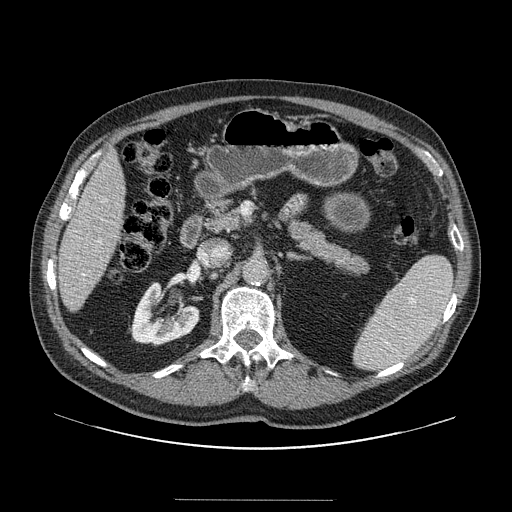
[im 215/292  soft-tissue]
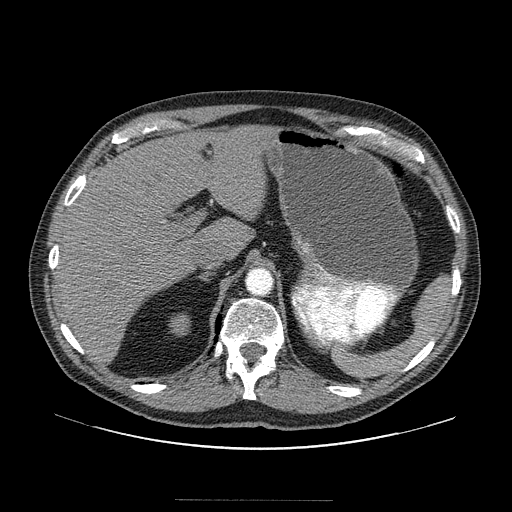
[im 230/292  lung]
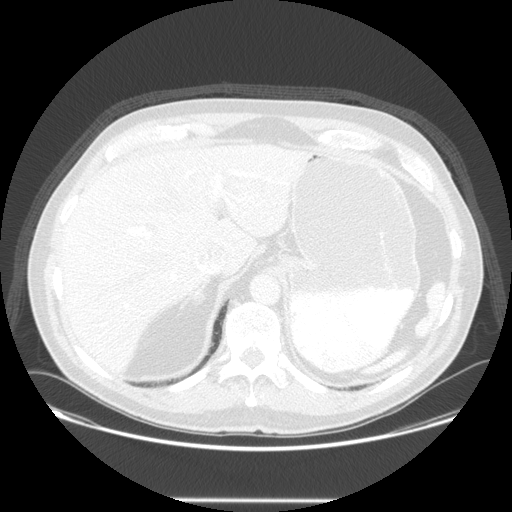
[im 246/292  soft-tissue]
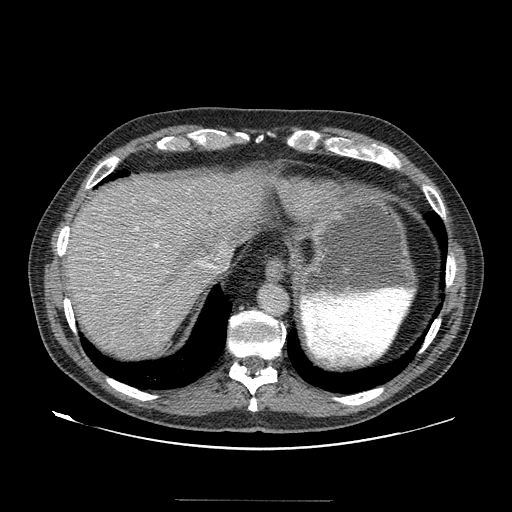
[im 246/292  lung]
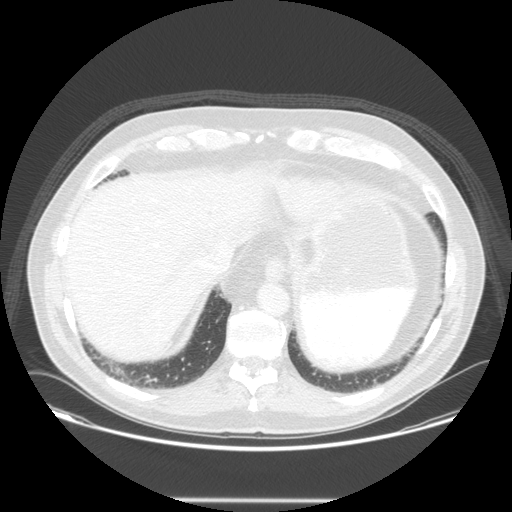
[im 246/292  bone]
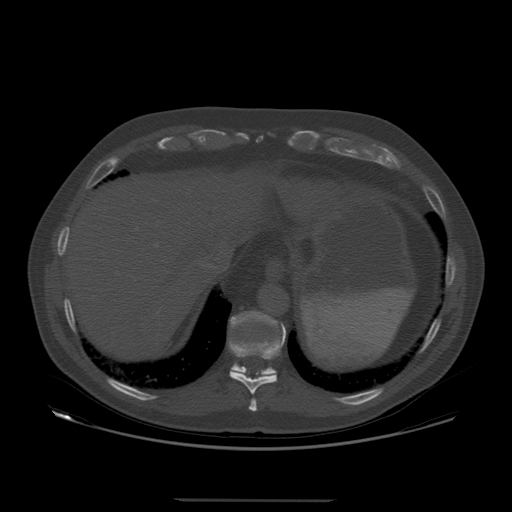
[im 261/292  lung]
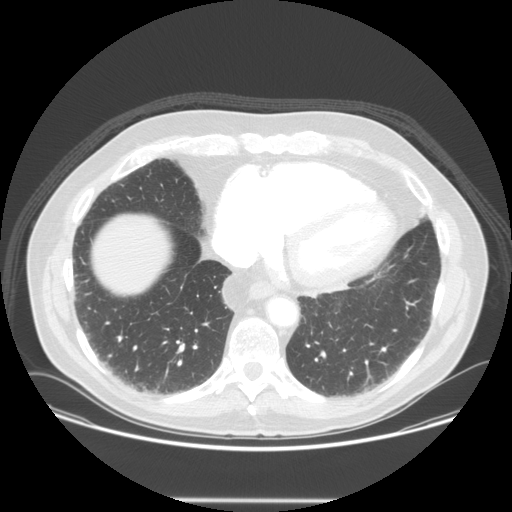
[im 276/292  soft-tissue]
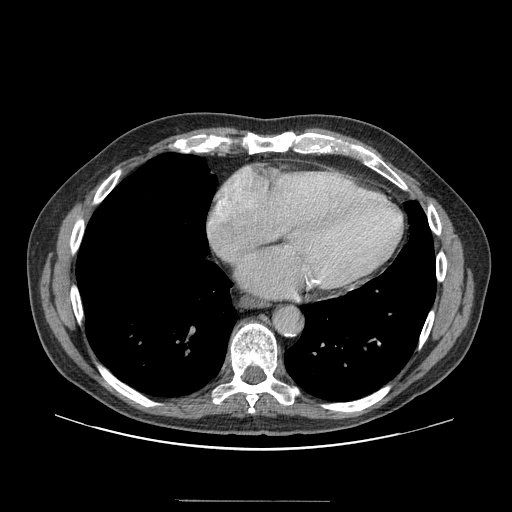
[im 276/292  lung]
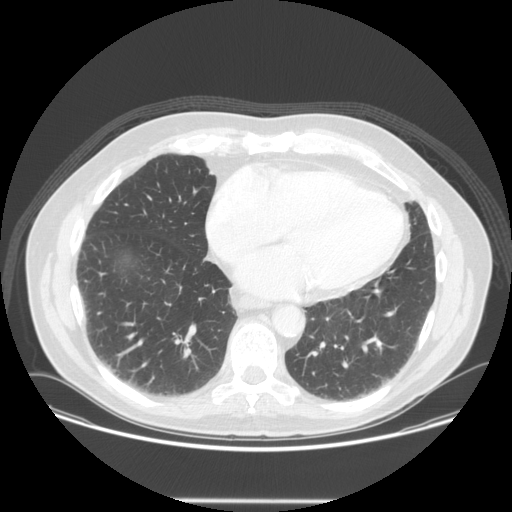

[Series 300: cor w/o · coronal · non-contrast · 0.82mm/px · 1 of 133 slices shown, 2 images]
[im 67/133  soft-tissue]
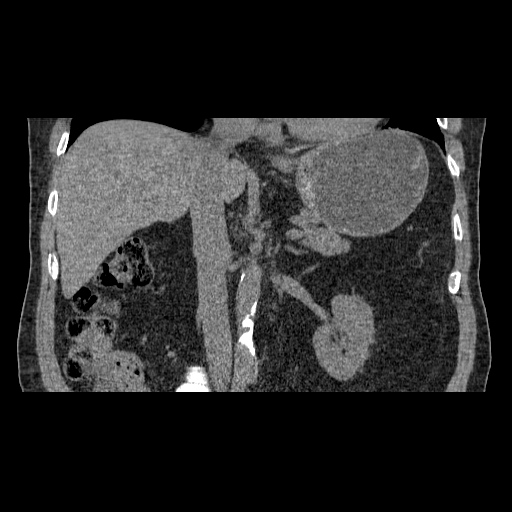
[im 67/133  bone]
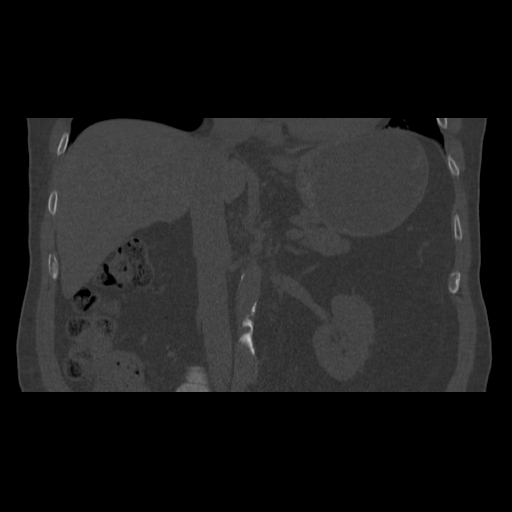

[13 of 46 positions shown; findings below may reference images not displayed]

FINDINGS: On MRI, there was a 1.8 x 1.2 x 1.5 cm lesion involving
the posterior suprahilar lip of the right kidney. On CT, this is
best seen following contrast and measures approximately 3.0 x 2.2 x
2.4 cm.  This demonstrates a small calcification superiorly.  There
is an apparent fatty component medially.  The soft tissue component
laterally demonstrates no significant enhancement.

The left kidney appears unremarkable.  There is no adrenal mass.
There is no retroperitoneal lymphadenopathy. A 1.7 cm
retroperitoneal nodule posterior and to the left of the aorta is
unchanged, corresponding with a probable lymphocele on MRI.  The
renal veins and IVC appear normal.

The lung bases are clear.  The liver, spleen and pancreas appear
normal.  The gallbladder is contracted.  There is no biliary
dilatation.

No bowel abnormalities are seen.  The prostate gland is markedly
enlarged, measuring 6.2 x 5.9 cm transverse.  No bladder
abnormalities are evident.

There is a hemangioma within the L4 vertebral body and a unilateral
left-sided L5 pars defect.  No metastases are identified.
IMPRESSION: 1.  The previously demonstrated lesion involving the upper pole of
the right kidney has changed its appearance over the nearly 4-year
interval. Although the overall size is larger, there is a fatty
component medially and the soft tissue component laterally does not
show significant enhancement.  Current appearance suggests prior
percutaneous ablation - correlate clinically. If there has been no
treatment of this lesion, the fatty component suggests the
possibility of an angiomyolipoma (although prior biopsy indicated
renal cell carcinoma).  If there has been no treatment to explain
the change in appearance/enlargement, continued follow-up may be
warranted.
2.  Stable left periaortic nodule, cystic on MRI and likely a
lymphocele.
3.  No evidence of metastatic disease.

## 2014-05-13 ENCOUNTER — Ambulatory Visit (INDEPENDENT_AMBULATORY_CARE_PROVIDER_SITE_OTHER): Payer: Medicare PPO | Admitting: Internal Medicine

## 2014-05-13 VITALS — BP 98/62 | HR 140 | Temp 98.7°F | Resp 16 | Ht 69.0 in | Wt 178.0 lb

## 2014-05-13 DIAGNOSIS — R000 Tachycardia, unspecified: Secondary | ICD-10-CM

## 2014-05-13 DIAGNOSIS — H1089 Other conjunctivitis: Secondary | ICD-10-CM

## 2014-05-13 DIAGNOSIS — A499 Bacterial infection, unspecified: Secondary | ICD-10-CM | POA: Diagnosis not present

## 2014-05-13 DIAGNOSIS — I4891 Unspecified atrial fibrillation: Secondary | ICD-10-CM | POA: Diagnosis not present

## 2014-05-13 DIAGNOSIS — J40 Bronchitis, not specified as acute or chronic: Secondary | ICD-10-CM | POA: Diagnosis not present

## 2014-05-13 DIAGNOSIS — H109 Unspecified conjunctivitis: Secondary | ICD-10-CM

## 2014-05-13 MED ORDER — ALBUTEROL SULFATE HFA 108 (90 BASE) MCG/ACT IN AERS: 2.0000 | INHALATION_SPRAY | RESPIRATORY_TRACT | Status: DC | PRN

## 2014-05-13 MED ORDER — HYDROCOD POLST-CHLORPHEN POLST 10-8 MG/5ML PO LQCR: 5.0000 mL | Freq: Two times a day (BID) | ORAL | Status: DC | PRN

## 2014-05-13 MED ORDER — OFLOXACIN 0.3 % OP SOLN: 1.0000 [drp] | OPHTHALMIC | Status: DC

## 2014-05-13 MED ORDER — AZITHROMYCIN 500 MG PO TABS: 500.0000 mg | ORAL_TABLET | Freq: Every day | ORAL | Status: DC

## 2014-05-13 NOTE — Progress Notes (Signed)
   Subjective:    Patient ID: [name]., male    DOB: 02-22-1941, 74 y.o.   MRN: [id number]  HPI Has cough, wheezing, is using albuterol inhaler sparingly. Has biteral purulrnt discharge from each eye. Knows afib too fast but has no diizzy, sob, cp,, or syncope Review of Systems     Objective:   Physical Exam  Constitutional: He is oriented to person, place, and time. He appears well-developed and well-nourished. No distress.  HENT:  Head: Normocephalic.  Right Ear: External ear normal.  Left Ear: External ear normal.  Nose: Mucosal edema and rhinorrhea present. No sinus tenderness. No epistaxis.  Mouth/Throat: Oropharynx is clear and moist.  Eyes: EOM are normal. Pupils are equal, round, and reactive to light. Right eye exhibits discharge and exudate. Left eye exhibits discharge and exudate. Right conjunctiva is injected. Left conjunctiva is injected.    Copious purulent discharge Red , swollen upper and lower bilateral eyelids.  Vision normal  Cardiovascular: Intact distal pulses.  An irregularly irregular rhythm present. Tachycardia present.  Exam reveals no gallop.   No murmur heard. Pulmonary/Chest: Effort normal. No tachypnea. No respiratory distress. He has no decreased breath sounds. He has wheezes. He has rhonchi. He has no rales.  Musculoskeletal: Normal range of motion.  Neurological: He is alert and oriented to person, place, and time. He has normal strength. No cranial nerve deficit or sensory deficit. He exhibits normal muscle tone. Coordination and gait normal.          Assessment & Plan:  Acute bronchitis/Bacterial conjunctivitis Atrial fibrillation with RVR/120 per minute/see cardiologist this week to evaluate.  Ocuflox opthalmic/Zithromax 500mg  5d Tussionex prn

## 2014-05-13 NOTE — Patient Instructions (Signed)
Acute Bronchitis Bronchitis is inflammation of the airways that extend from the windpipe into the lungs (bronchi). The inflammation often causes mucus to develop. This leads to a cough, which is the most common symptom of bronchitis.  In acute bronchitis, the condition usually develops suddenly and goes away over time, usually in a couple weeks. Smoking, allergies, and asthma can make bronchitis worse. Repeated episodes of bronchitis may cause further lung problems.  CAUSES Acute bronchitis is most often caused by the same virus that causes a cold. The virus can spread from person to person (contagious) through coughing, sneezing, and touching contaminated objects. SIGNS AND SYMPTOMS   Cough.   Fever.   Coughing up mucus.   Body aches.   Chest congestion.   Chills.   Shortness of breath.   Sore throat.  DIAGNOSIS  Acute bronchitis is usually diagnosed through a physical exam. Your health care provider will also ask you questions about your medical history. Tests, such as chest X-rays, are sometimes done to rule out other conditions.  TREATMENT  Acute bronchitis usually goes away in a couple weeks. Oftentimes, no medical treatment is necessary. Medicines are sometimes given for relief of fever or cough. Antibiotic medicines are usually not needed but may be prescribed in certain situations. In some cases, an inhaler may be recommended to help reduce shortness of breath and control the cough. A cool mist vaporizer may also be used to help thin bronchial secretions and make it easier to clear the chest.  HOME CARE INSTRUCTIONS  Get plenty of rest.   Drink enough fluids to keep your urine clear or pale yellow (unless you have a medical condition that requires fluid restriction). Increasing fluids may help thin your respiratory secretions (sputum) and reduce chest congestion, and it will prevent dehydration.   Take medicines only as directed by your health care provider.  If  you were prescribed an antibiotic medicine, finish it all even if you start to feel better.  Avoid smoking and secondhand smoke. Exposure to cigarette smoke or irritating chemicals will make bronchitis worse. If you are a smoker, consider using nicotine gum or skin patches to help control withdrawal symptoms. Quitting smoking will help your lungs heal faster.   Reduce the chances of another bout of acute bronchitis by washing your hands frequently, avoiding people with cold symptoms, and trying not to touch your hands to your mouth, nose, or eyes.   Keep all follow-up visits as directed by your health care provider.  SEEK MEDICAL CARE IF: Your symptoms do not improve after 1 week of treatment.  SEEK IMMEDIATE MEDICAL CARE IF:  You develop an increased fever or chills.   You have chest pain.   You have severe shortness of breath.  You have bloody sputum.   You develop dehydration.  You faint or repeatedly feel like you are going to pass out.  You develop repeated vomiting.  You develop a severe headache. MAKE SURE YOU:   Understand these instructions.  Will watch your condition.  Will get help right away if you are not doing well or get worse. Document Released: 03/24/2004 Document Revised: 07/01/2013 Document Reviewed: 08/07/2012 [facility] Patient Information 2015 [place], [place]. This information is not intended to replace advice given to you by your health care provider. Make sure you discuss any questions you have with your health care provider. Bacterial Conjunctivitis Bacterial conjunctivitis, commonly called pink eye, is an inflammation of the clear membrane that covers the white part of the eye (conjunctiva).  The inflammation can also happen on the underside of the eyelids. The blood vessels in the conjunctiva become inflamed, causing the eye to become red or pink. Bacterial conjunctivitis may spread easily from one eye to another and from person to person  (contagious).  CAUSES  Bacterial conjunctivitis is caused by bacteria. The bacteria may come from your own skin, your upper respiratory tract, or from someone else with bacterial conjunctivitis. SYMPTOMS  The normally white color of the eye or the underside of the eyelid is usually pink or red. The pink eye is usually associated with irritation, tearing, and some sensitivity to light. Bacterial conjunctivitis is often associated with a thick, yellowish discharge from the eye. The discharge may turn into a crust on the eyelids overnight, which causes your eyelids to stick together. If a discharge is present, there may also be some blurred vision in the affected eye. DIAGNOSIS  Bacterial conjunctivitis is diagnosed by your caregiver through an eye exam and the symptoms that you report. Your caregiver looks for changes in the surface tissues of your eyes, which may point to the specific type of conjunctivitis. A sample of any discharge may be collected on a cotton-tip swab if you have a severe case of conjunctivitis, if your cornea is affected, or if you keep getting repeat infections that do not respond to treatment. The sample will be sent to a lab to see if the inflammation is caused by a bacterial infection and to see if the infection will respond to antibiotic medicines. TREATMENT   Bacterial conjunctivitis is treated with antibiotics. Antibiotic eyedrops are most often used. However, antibiotic ointments are also available. Antibiotics pills are sometimes used. Artificial tears or eye washes may ease discomfort. HOME CARE INSTRUCTIONS   To ease discomfort, apply a cool, clean washcloth to your eye for 10-20 minutes, 3-4 times a day.  Gently wipe away any drainage from your eye with a warm, wet washcloth or a cotton ball.  Wash your hands often with soap and water. Use paper towels to dry your hands.  Do not share towels or washcloths. This may spread the infection.  Change or wash your  pillowcase every day.  You should not use eye makeup until the infection is gone.  Do not operate machinery or drive if your vision is blurred.  Stop using contact lenses. Ask your caregiver how to sterilize or replace your contacts before using them again. This depends on the type of contact lenses that you use.  When applying medicine to the infected eye, do not touch the edge of your eyelid with the eyedrop bottle or ointment tube. SEEK IMMEDIATE MEDICAL CARE IF:   Your infection has not improved within 3 days after beginning treatment.  You had yellow discharge from your eye and it returns.  You have increased eye pain.  Your eye redness is spreading.  Your vision becomes blurred.  You have a fever or persistent symptoms for more than 2-3 days.  You have a fever and your symptoms suddenly get worse.  You have facial pain, redness, or swelling. MAKE SURE YOU:   Understand these instructions.  Will watch your condition.  Will get help right away if you are not doing well or get worse. Document Released: 02/14/2005 Document Revised: 07/01/2013 Document Reviewed: 07/18/2011 [facility] Patient Information 2015 [place], [place]. This information is not intended to replace advice given to you by your health care provider. Make sure you discuss any questions you have with your health care provider.

## 2014-05-14 ENCOUNTER — Telehealth: Payer: Self-pay | Admitting: Cardiology

## 2014-05-14 NOTE — Telephone Encounter (Signed)
Pt had an episode,where his heart was speeding up.Pt has bronchitis,pt wonder if he needs to see Dr [place]ique.

## 2014-05-14 NOTE — Telephone Encounter (Signed)
Returned call to patient no answer.LMTC. 

## 2014-05-16 NOTE — Telephone Encounter (Signed)
Spoke to patient received a message you wanted me to call you about your heart rate.Stated he has bronchitis and saw his PCP this week.Stated medication prescribed.Stated he was prescribed Albuterol inhaler.Stated he will only use if needed.Stated he had a problem with Albuterol last year caused fast heart beat.Stated his heart rate when he exercises is around 120 to 130 bpm,at rest 58 to 73 bpm.Advised to continue same medications.Advised to keep appointment with Dr.Jordan in [place] to call sooner if needed.

## 2014-05-19 ENCOUNTER — Ambulatory Visit (INDEPENDENT_AMBULATORY_CARE_PROVIDER_SITE_OTHER): Payer: Medicare PPO | Admitting: Family Medicine

## 2014-05-19 ENCOUNTER — Encounter: Payer: Self-pay | Admitting: Family Medicine

## 2014-05-19 VITALS — BP 120/81 | HR 102 | Temp 97.8°F | Resp 16 | Ht 69.5 in | Wt 174.0 lb

## 2014-05-19 DIAGNOSIS — G4700 Insomnia, unspecified: Secondary | ICD-10-CM

## 2014-05-19 DIAGNOSIS — J449 Chronic obstructive pulmonary disease, unspecified: Secondary | ICD-10-CM | POA: Diagnosis not present

## 2014-05-19 DIAGNOSIS — I482 Chronic atrial fibrillation, unspecified: Secondary | ICD-10-CM

## 2014-05-19 DIAGNOSIS — M2041 Other hammer toe(s) (acquired), right foot: Secondary | ICD-10-CM | POA: Diagnosis not present

## 2014-05-19 DIAGNOSIS — Z9989 Dependence on other enabling machines and devices: Secondary | ICD-10-CM

## 2014-05-19 DIAGNOSIS — G4733 Obstructive sleep apnea (adult) (pediatric): Secondary | ICD-10-CM

## 2014-05-19 MED ORDER — ZOLPIDEM TARTRATE 5 MG PO TABS: 2.5000 mg | ORAL_TABLET | Freq: Every evening | ORAL | Status: DC | PRN

## 2014-05-19 NOTE — Progress Notes (Signed)
Subjective:  This chart was scribed for [name] Ray, MD by [name], ED Scribe at [facility].The patient was seen in exam room 25 and the patient's care was started at 1:24 PM.   Patient ID: [name] Fetters., male    DOB: 1940-04-20, 74 y.o.   MRN: [id number]   Chief Complaint  Patient presents with  . Bronchitis    follow up on bronchitis  . Atrial Fibrillation    6 month follow up  . Insomnia    6 month follow up   HPI HPI Comments: [name] Beg. is a 74 y.o. male who presents to [facility] for a 6 month follow up.   Atrial Fibrillation: He is followed by [name] at [facility] last visit September 25 th 2015. A-fib controlled rate he takes pradaxa, metoprolol and diltiazem. Initially was paroxsymal A-Fib but not permanent planned on follow up in September 2016.  Bronchitis: He was seen [name] on December 2015, he is on QVAR BID, and rare albuterol use. Episodic Flonase use for allergies. He his also on CPAP for OSA. Optional trial of one puff qhs of his QVAR at then and continued on his albuterol. He was last seen by [name] on six days ago for a cough and wheezing. He was treated with ocuflox eye drops, azithromycin 500 mg for 5 days and tussionex if needed. Last dose of azithromycin was yesterday. He has not side effects with his medication. No change from telephone note with cardiologist three days ago. He denies current wheezing, SOB and fever.  Insomnia: He has tolerated low dose of Ambien in the past. recommended 2.5-5 mg qhs prn at his visit in September 2015. He typically takes half a dose of Ambien once or twice a week. This has been helpful.  Last labs drawn on 09/16/2013: Borderline creatinine at 1.20 and eGFR at 59  He is going to [place] August 1st will return on the 28 th of August. Pt exercises regularly.   Patient Active Problem List   Diagnosis Date Noted  . BPH (benign prostatic hyperplasia)  11/07/2011  . Hip pain 07/04/2011  . Perianal itch 07/04/2011  . OSA (obstructive sleep apnea) 08/08/2010  . Chronic obstructive asthma 07/14/2010  . Rhinitis 07/14/2010  . Cough 07/13/2010  . BARRETT'S ESOPHAGUS 03/11/2009  . Atrial fibrillation 07/29/2008  . GERD 07/29/2008   Past Medical History  Diagnosis Date  . Atrial fibrillation   . Barrett's esophagus   . Paroxysmal atrial fibrillation     Hospitalized on 05/27/10  . GERD (gastroesophageal reflux disease)   . BPH (benign prostatic hyperplasia)   . Barrett esophagus   . Hiatal hernia   . Renal cell carcinoma 2009    right renal mass, followed by Dr. Ander Slade  . OSA (obstructive sleep apnea) 08/05/10 [facility]    AHI 24  . Chronic obstructive asthma   . Difficult intubation   . Shortness of breath   . Allergy    Past Surgical History  Procedure Laterality Date  . Exploratory laparotomy, segmental small bowel resection of a hemangioma, repair of incarcerated umbelical hernia  41/6384  . Exploratory laparotomy w/ bowel resection  12/1999    segmental sbr of a hemangioma, repair of incarcerated umbilical hernia  . Renal cancer surgery with rf ablation    . Tonsillectomy    . Basal cell carcinoma excision    . Eye surgery  Allergies  Allergen Reactions  . Proair Hfa [Albuterol]    Prior to Admission medications   Medication Sig Start Date End Date Taking? Authorizing Provider  albuterol (PROAIR HFA) 108 (90 BASE) MCG/ACT inhaler Inhale 2 puffs into the lungs every 6 (six) hours as needed for wheezing or shortness of breath. 05/24/13   [name], MD  albuterol (PROVENTIL HFA;VENTOLIN HFA) 108 (90 BASE) MCG/ACT inhaler Inhale 2 puffs into the lungs every 4 (four) hours as needed for wheezing or shortness of breath (cough, shortness of breath or wheezing.). 05/13/14   Orma Flaming, MD  AMBULATORY NON FORMULARY MEDICATION CPAP at night    Historical Provider, MD  Apoaequorin (PREVAGEN PO) Take 1 tablet by mouth daily.      Historical Provider, MD  azithromycin (ZITHROMAX) 500 MG tablet Take 1 tablet (500 mg total) by mouth daily. 05/13/14   Orma Flaming, MD  beclomethasone (QVAR) 80 MCG/ACT inhaler Inhale 1 puff into the lungs 2 (two) times daily. 02/11/14   [name], MD  cetirizine (ZYRTEC) 10 MG tablet Take 10 mg by mouth as needed.     Historical Provider, MD  chlorpheniramine-HYDROcodone (TUSSIONEX PENNKINETIC ER) 10-8 MG/5ML LQCR Take 5 mLs by mouth every 12 (twelve) hours as needed for cough (cough). 05/13/14   Orma Flaming, MD  dabigatran (PRADAXA) 150 MG CAPS capsule Take 1 capsule (150 mg total) by mouth 2 (two) times daily. 11/26/13   [name], MD  diltiazem (CARTIA XT) 180 MG 24 hr capsule Take 1 capsule (180 mg total) by mouth daily. TAKE 1 CAPSULE BY MOUTH DAILY 11/26/13   [name], MD  dutasteride (AVODART) 0.5 MG capsule Take 0.5 mg by mouth daily.      Historical Provider, MD  fluticasone (FLONASE) 50 MCG/ACT nasal spray INSTILL 2 SPRAYS IN EACH NOSTRIL EVERY DAY AS DIRECTED 05/24/13   [name], MD  Glucos-Chondroit-Hyaluron-MSM (GLUCOSAMINE CHONDROITIN JOINT PO) Take by mouth.    Historical Provider, MD  metoprolol succinate (TOPROL-XL) 50 MG 24 hr tablet Take 1 tablet (50 mg total) by mouth daily. TAKE 1 TABLET BY MOUTH EVERY DAY WITH OR FOLLOWING A MEAL 11/26/13   [name], MD  Multiple Vitamin (MULTIVITAMIN) capsule Take 1 capsule by mouth daily. Twice/week    Historical Provider, MD  ofloxacin (OCUFLOX) 0.3 % ophthalmic solution Place 1 drop into the left eye every 4 (four) hours. 05/13/14   Orma Flaming, MD  omega-3 fish oil (MAXEPA) 1000 MG CAPS capsule Take by mouth.    Historical Provider, MD  omeprazole (PRILOSEC) 20 MG capsule TAKE ONE CAPSULE BY MOUTH EVERY DAY 09/25/13   [name], MD  OVER THE COUNTER MEDICATION Sleep apnea machine    Historical Provider, MD  zolpidem (AMBIEN) 5 MG tablet Take 0.5-1 tablets (2.5-5 mg total) by mouth at bedtime as needed. For  anxiety/insomnia 11/11/13   [name], MD   History   Social History  . Marital Status: Single    Spouse Name: N/A  . Number of Children: N/A  . Years of Education: N/A   Occupational History  . tv producer/construction   . televison Secretary/administrator    Social History Main Topics  . Smoking status: Former Smoker -- 10 years    Types: Cigars    Quit date: 02/29/1980  . Smokeless tobacco: Never Used     Comment: quit when he was 74 years old  . Alcohol Use: No  . Drug Use: No  .  Sexual Activity: Not Currently   Other Topics Concern  . Not on file   Social History Narrative   ** Merged History Encounter ** Married. Education: [employer]. Exercise: Eleptical times a week for 45 minutes.          Review of Systems  Constitutional: Negative for fever, fatigue and unexpected weight change.  Eyes: Negative for visual disturbance.  Respiratory: Negative for cough, chest tightness, shortness of breath and wheezing.   Cardiovascular: Negative for chest pain, palpitations and leg swelling.  Gastrointestinal: Negative for abdominal pain and blood in stool.  Neurological: Negative for dizziness, light-headedness and headaches.      Objective:  BP 120/81 mmHg  Pulse 102  Temp(Src) 97.8 F (36.6 C) (Oral)  Resp 16  Ht 5' 9.5" (1.765 m)  Wt 174 lb (78.926 kg)  BMI 25.34 kg/m2  SpO2 99% Physical Exam  Constitutional: He is oriented to person, place, and time. He appears well-developed and well-nourished. No distress.  HENT:  Head: Normocephalic and atraumatic.  Right Ear: Tympanic membrane, external ear and ear canal normal.  Left Ear: Tympanic membrane, external ear and ear canal normal.  Nose: No rhinorrhea.  Mouth/Throat: Oropharynx is clear and moist and mucous membranes are normal. No oropharyngeal exudate or posterior oropharyngeal erythema.  Eyes: Conjunctivae and EOM are normal. Pupils are equal, round, and reactive to light.  Neck: Normal range of motion. Neck supple. No  JVD present. Carotid bruit is not present.  Cardiovascular: Normal rate, regular rhythm, normal heart sounds and intact distal pulses.   No murmur heard. Pulmonary/Chest: Effort normal and breath sounds normal. No respiratory distress. He has no wheezes. He has no rhonchi. He has no rales.  Abdominal: Soft. There is no tenderness.  Musculoskeletal: Normal range of motion. He exhibits no edema.  Hammer toe at the right second toe.  Lymphadenopathy:    He has no cervical adenopathy.  Neurological: He is alert and oriented to person, place, and time.  Skin: Skin is warm and dry. No rash noted.  Strap across his toes on his right second and third toe.  Psychiatric: He has a normal mood and affect. His behavior is normal.  Nursing note and vitals reviewed.  Office Visit on 09/16/2013  Component Date Value Ref Range Status  . Sodium 09/16/2013 139  135 - 145 mEq/L Final  . Potassium 09/16/2013 4.6  3.5 - 5.3 mEq/L Final  . Chloride 09/16/2013 102  96 - 112 mEq/L Final  . CO2 09/16/2013 29  19 - 32 mEq/L Final  . Glucose, Bld 09/16/2013 84  70 - 99 mg/dL Final  . BUN 09/16/2013 14  6 - 23 mg/dL Final  . Creat 09/16/2013 1.20  0.50 - 1.35 mg/dL Final  . Total Bilirubin 09/16/2013 0.6  0.2 - 1.2 mg/dL Final  . Alkaline Phosphatase 09/16/2013 73  39 - 117 U/L Final  . AST 09/16/2013 16  0 - 37 U/L Final  . ALT 09/16/2013 12  0 - 53 U/L Final  . Total Protein 09/16/2013 7.2  6.0 - 8.3 g/dL Final  . Albumin 09/16/2013 3.9  3.5 - 5.2 g/dL Final  . Calcium 09/16/2013 9.3  8.4 - 10.5 mg/dL Final  . GFR, Est African American 09/16/2013 68   Final  . GFR, Est Non African American 09/16/2013 59*  Final   Comment:  The estimated GFR is a calculation valid for adults (>=40 years old)                          that uses the CKD-EPI algorithm to adjust for age and sex. It is                            not to be used for children, pregnant women, hospitalized patients,                              patients on dialysis, or with rapidly changing kidney function.                          According to the NKDEP, eGFR >89 is normal, 60-89 shows mild                          impairment, 30-59 shows moderate impairment, 15-29 shows severe                          impairment and <15 is ESRD.                             Marland Kitchen Cholesterol 09/16/2013 157  0 - 200 mg/dL Final   Comment: ATP III Classification:                                < 200        mg/dL        Desirable                               200 - 239     mg/dL        Borderline High                               >= 240        mg/dL        High                             . Triglycerides 09/16/2013 61  <150 mg/dL Final  . HDL 09/16/2013 48  >39 mg/dL Final  . Total CHOL/HDL Ratio 09/16/2013 3.3   Final  . VLDL 09/16/2013 12  0 - 40 mg/dL Final  . LDL Cholesterol 09/16/2013 97  0 - 99 mg/dL Final   Comment:                            Total Cholesterol/HDL Ratio:CHD Risk                                                 Coronary Heart Disease Risk Table  Men       Women                                   1/2 Average Risk              3.4        3.3                                       Average Risk              5.0        4.4                                    2X Average Risk              9.6        7.1                                    3X Average Risk             23.4       11.0                          Use the calculated Patient Ratio above and the CHD Risk table                           to determine the patient's CHD Risk.                          ATP III Classification (LDL):                                < 100        mg/dL         Optimal                               100 - 129     mg/dL         Near or Above Optimal                               130 - 159     mg/dL         Borderline High                               160 - 189     mg/dL          High                                > 190        mg/dL         Very High                             .  WBC 09/16/2013 4.9  4.0 - 10.5 K/uL Final  . RBC 09/16/2013 4.75  4.22 - 5.81 MIL/uL Final  . Hemoglobin 09/16/2013 14.3  13.0 - 17.0 g/dL Final  . HCT 09/16/2013 41.6  39.0 - 52.0 % Final  . MCV 09/16/2013 87.6  78.0 - 100.0 fL Final  . MCH 09/16/2013 30.1  26.0 - 34.0 pg Final  . MCHC 09/16/2013 34.4  30.0 - 36.0 g/dL Final  . RDW 09/16/2013 14.1  11.5 - 15.5 % Final  . Platelets 09/16/2013 212  150 - 400 K/uL Final      Assessment & Plan:    Kaye Mitro. is a 74 y.o. male Insomnia - Plan: zolpidem (AMBIEN) 5 MG tablet  -stable with infrequent use of low dose ambien. Refilled.   Chronic atrial fibrillation - Plan: Basic metabolic panel  Labs pending.  Stable. Cont same meds and routine follow up with cardiology.   Hammer toe of right foot - Plan: Ambulatory referral to Podiatry  -refer to podiatry.   COPD with asthma  -recent bronchitis now resolving. Clear on exam. Has albuterol if needed, tolerating lower dose of Qvar.   OSA on CPAP  -continue CPAP.    Plan on physical in approx 5-6 months.   Meds ordered this encounter  Medications  . zolpidem (AMBIEN) 5 MG tablet    Sig: Take 0.5-1 tablets (2.5-5 mg total) by mouth at bedtime as needed. For anxiety/insomnia    Dispense:  30 tablet    Refill:  0   Patient Instructions  Follow up after trip to [place] and out [place] your trip!  For your cough - the azithromycin (antibiotic) is still in you sytem for another 5 days. Albuterol if needed. If no cough at night, you don't need the cough syrup.   For the atrial fibrillation, continue same medication and follow up in September with [name].   Continue ambien - lowest effective dose. CPAP as prescribed for sleep apnea.   I will refer to Dr. Prudence Davidson for your hammer toe.   Return to the clinic or go to the nearest emergency room if any of your symptoms  worsen or new symptoms occur.          I personally performed the services described in this documentation, which was scribed in my presence. The recorded information has been reviewed and considered, and addended by me as needed.

## 2014-05-19 NOTE — Patient Instructions (Addendum)
Follow up after trip to [place] and out [place] your trip!  For your cough - the azithromycin (antibiotic) is still in you sytem for another 5 days. Albuterol if needed. If no cough at night, you don't need the cough syrup.   For the atrial fibrillation, continue same medication and follow up in September with [name].   Continue ambien - lowest effective dose. CPAP as prescribed for sleep apnea.   I will refer to Dr. Prudence Davidson for your hammer toe.   Return to the clinic or go to the nearest emergency room if any of your symptoms worsen or new symptoms occur.

## 2014-05-20 LAB — BASIC METABOLIC PANEL
BUN: 12 mg/dL (ref 6–23)
CALCIUM: 9.8 mg/dL (ref 8.4–10.5)
CO2: 28 mEq/L (ref 19–32)
Chloride: 100 mEq/L (ref 96–112)
Creat: 1.15 mg/dL (ref 0.50–1.35)
GLUCOSE: 85 mg/dL (ref 70–99)
POTASSIUM: 4.2 meq/L (ref 3.5–5.3)
Sodium: 140 mEq/L (ref 135–145)

## 2014-07-04 ENCOUNTER — Encounter: Payer: Self-pay | Admitting: Gastroenterology

## 2014-07-04 ENCOUNTER — Ambulatory Visit (INDEPENDENT_AMBULATORY_CARE_PROVIDER_SITE_OTHER): Payer: Medicare PPO | Admitting: Gastroenterology

## 2014-07-04 ENCOUNTER — Telehealth: Payer: Self-pay

## 2014-07-04 VITALS — BP 128/80 | HR 88 | Ht 69.5 in | Wt 180.1 lb

## 2014-07-04 DIAGNOSIS — I482 Chronic atrial fibrillation, unspecified: Secondary | ICD-10-CM

## 2014-07-04 DIAGNOSIS — Z7901 Long term (current) use of anticoagulants: Secondary | ICD-10-CM

## 2014-07-04 DIAGNOSIS — K2270 Barrett's esophagus without dysplasia: Secondary | ICD-10-CM | POA: Diagnosis not present

## 2014-07-04 NOTE — Patient Instructions (Addendum)
You have been scheduled for an endoscopy. Please follow written instructions given to you at your visit today. If you use inhalers (even only as needed), please bring them with you on the day of your procedure.   Thank you for choosing me and [facility].  Pricilla Riffle. Dagoberto Ligas., MD., Marval Regal  cc: Merri Ray, MD

## 2014-07-04 NOTE — Progress Notes (Signed)
    History of Present Illness: This is a 74 year old male with a history of Barrett's esophagus diagnosed in 2002. Last EGD in June 2011 did not show Barrett's on biopsies. He was recommended to have a 3 year surveillance endoscopy and he is now returning. He is maintained on Pradaxa for atrial fibrillation. Reflux symptoms are well-controlled on omeprazole 20 mg daily.  Review of Systems: Pertinent positive and negative review of systems were noted in the above HPI section. All other review of systems were otherwise negative.  Current Medications, Allergies, Past Medical History, Past Surgical History, Family History and Social History were reviewed in [employer] record.  Physical Exam: General: Well developed, well nourished, no acute distress Head: Normocephalic and atraumatic Eyes:  sclerae anicteric, EOMI Ears: Normal auditory acuity Mouth: No deformity or lesions Neck: Supple, no masses or thyromegaly Lungs: Clear throughout to auscultation Heart: Regular rate and rhythm; no murmurs, rubs or bruits Abdomen: Soft, non tender and non distended. No masses, hepatosplenomegaly or hernias noted. Normal Bowel sounds Musculoskeletal: Symmetrical with no gross deformities  Skin: No lesions on visible extremities Pulses:  Normal pulses noted Extremities: No clubbing, cyanosis, edema or deformities noted Neurological: Alert oriented x 4, grossly nonfocal Cervical Nodes:  No significant cervical adenopathy Inguinal Nodes: No significant inguinal adenopathy Psychological:  Alert and cooperative. Normal mood and affect  Assessment and Recommendations:  1. Atrial fibrillation maintained on Pradaxa. Will hold Pradaxa for 2 days prior to endoscopic procedures - will instruct when and how to resume after procedure. Additional rare but real risk of stroke or other vascular clotting events off Pradaxa so explained and need to seek urgent help if any signs of these problems  occur. Will communicate by phone or EMR with patient's prescribing provider to confirm that holding Pradaxa is reasonable in this case.

## 2014-07-04 NOTE — Assessment & Plan Note (Addendum)
He is overdue for a 3 year interval surveillance endoscopy. Continue omeprazole 20 mg daily and standard antireflux measures. Schedule endoscopy. The risks (including bleeding, perforation, infection, missed lesions, medication reactions and possible hospitalization or surgery if complications occur), benefits, and alternatives to endoscopy with possible biopsy and possible dilation were discussed with the patient and they consent to proceed.

## 2014-07-04 NOTE — Telephone Encounter (Signed)
He may hold Pradaxa for endoscopy procedure 48-72 hours.  Consepcion Utt Martinique MD, [facility]

## 2014-07-04 NOTE — Telephone Encounter (Signed)
  07/04/2014   RE: [name]. DOB: 04/19/1940 MRN: [id number]   Dear [name],    We have scheduled the above patient for an endoscopic procedure. Our records show that he is on anticoagulation therapy.   Please advise as to how long the patient may come off his therapy of Pradaxa prior to the procedure, which is scheduled for 07/29/14.  Please route your answer to Marlon Pel, CMA   Sincerely,    Marlon Pel, CMA

## 2014-07-07 ENCOUNTER — Encounter: Payer: Self-pay | Admitting: General Practice

## 2014-07-07 NOTE — Telephone Encounter (Signed)
Patient notified to hold Pradaxa 48 hours prior to his EGD per [name]. Pt verbalized understanding.

## 2014-07-07 NOTE — Telephone Encounter (Signed)
Left a message for patient to return my call. 

## 2014-07-08 ENCOUNTER — Telehealth: Payer: Self-pay | Admitting: Cardiology

## 2014-07-08 NOTE — Telephone Encounter (Signed)
Pt called in wanting Dr. Doug Sou approval to take Viagra. Please f/u with pt  Thanks

## 2014-07-08 NOTE — Telephone Encounter (Signed)
Returned call to patient Dr.Jordan advised ok to take Viagra.

## 2014-07-21 ENCOUNTER — Telehealth: Payer: Self-pay

## 2014-07-21 ENCOUNTER — Other Ambulatory Visit: Payer: Self-pay

## 2014-07-21 DIAGNOSIS — K2270 Barrett's esophagus without dysplasia: Secondary | ICD-10-CM

## 2014-07-21 NOTE — Telephone Encounter (Signed)
I called and explained to the patient that he needs to be rescheduled to the hospital due to hx of difficult intubation  He is not having any current problems and will be rescheduled to 08/26/14 11:30 with Dr. Fuller Plan.  He will need to hold his pradaxa for 48 hours prior to the procedure He verbalized understanding of new instructions.

## 2014-07-21 NOTE — Telephone Encounter (Signed)
-----   Message from [name], MD sent at 07/21/2014  3:40 PM EDT ----- Regarding: FW: Difficult Intubatio Please reschedule at [facility].   ----- Message -----    From: Osvaldo Angst, CRNA    Sent: 07/21/2014   3:20 PM      To: [name], MD Subject: Difficult Intubatio                            Dr. Fuller Plan,  In this pt's medical history is listed difficult intubation.  We were unable to locate the source of that diagnosis.  Consequently I telephoned the patient.  He reports he is a difficult intubation and this was noted during his anesthetic for a surgical procedure in 2001 at [facility].  Due to this history he does not qualify for care at [facility].  Thanks,  Osvaldo Angst

## 2014-07-24 ENCOUNTER — Telehealth: Payer: Self-pay | Admitting: Cardiology

## 2014-07-24 DIAGNOSIS — I482 Chronic atrial fibrillation, unspecified: Secondary | ICD-10-CM

## 2014-07-24 NOTE — Telephone Encounter (Signed)
Pt called in wanting to know if he was going to need to do fasting labs prior to coming in on 9/29. Please call  Thanks

## 2014-07-24 NOTE — Telephone Encounter (Signed)
Returned call to patient no answer.LMTC. 

## 2014-07-25 NOTE — Telephone Encounter (Signed)
Received call from patient fasting lab orders mailed to patient.Stated he will have done 1 week before appointment with Dr.Jordan.

## 2014-07-29 ENCOUNTER — Encounter: Payer: Medicare PPO | Admitting: Gastroenterology

## 2014-08-21 ENCOUNTER — Encounter (HOSPITAL_COMMUNITY): Payer: Self-pay | Admitting: *Deleted

## 2014-08-26 ENCOUNTER — Ambulatory Visit (HOSPITAL_COMMUNITY): Payer: Medicare PPO | Admitting: Anesthesiology

## 2014-08-26 ENCOUNTER — Encounter (HOSPITAL_COMMUNITY)
Admission: RE | Disposition: A | Discharge status: Home / Self Care | Payer: Self-pay | Source: Ambulatory Visit | Attending: Gastroenterology

## 2014-08-26 ENCOUNTER — Encounter (HOSPITAL_COMMUNITY): Payer: Self-pay | Admitting: Gastroenterology

## 2014-08-26 ENCOUNTER — Ambulatory Visit (HOSPITAL_COMMUNITY)
Admission: RE | Admit: 2014-08-26 | Discharge: 2014-08-26 | Disposition: A | Discharge status: Home / Self Care | Payer: Medicare PPO | Source: Ambulatory Visit | Attending: Gastroenterology | Admitting: Gastroenterology

## 2014-08-26 DIAGNOSIS — K2980 Duodenitis without bleeding: Secondary | ICD-10-CM | POA: Diagnosis not present

## 2014-08-26 DIAGNOSIS — Z87891 Personal history of nicotine dependence: Secondary | ICD-10-CM | POA: Insufficient documentation

## 2014-08-26 DIAGNOSIS — J45909 Unspecified asthma, uncomplicated: Secondary | ICD-10-CM | POA: Diagnosis not present

## 2014-08-26 DIAGNOSIS — Z7901 Long term (current) use of anticoagulants: Secondary | ICD-10-CM | POA: Insufficient documentation

## 2014-08-26 DIAGNOSIS — K2270 Barrett's esophagus without dysplasia: Secondary | ICD-10-CM | POA: Diagnosis not present

## 2014-08-26 DIAGNOSIS — I4891 Unspecified atrial fibrillation: Secondary | ICD-10-CM | POA: Insufficient documentation

## 2014-08-26 DIAGNOSIS — J449 Chronic obstructive pulmonary disease, unspecified: Secondary | ICD-10-CM | POA: Diagnosis not present

## 2014-08-26 DIAGNOSIS — K449 Diaphragmatic hernia without obstruction or gangrene: Secondary | ICD-10-CM | POA: Insufficient documentation

## 2014-08-26 DIAGNOSIS — K3189 Other diseases of stomach and duodenum: Secondary | ICD-10-CM

## 2014-08-26 DIAGNOSIS — Z79899 Other long term (current) drug therapy: Secondary | ICD-10-CM | POA: Insufficient documentation

## 2014-08-26 DIAGNOSIS — G4730 Sleep apnea, unspecified: Secondary | ICD-10-CM | POA: Insufficient documentation

## 2014-08-26 DIAGNOSIS — K219 Gastro-esophageal reflux disease without esophagitis: Secondary | ICD-10-CM | POA: Diagnosis not present

## 2014-08-26 HISTORY — DX: Other hammer toe(s) (acquired), right foot: M20.41

## 2014-08-26 HISTORY — PX: ESOPHAGOGASTRODUODENOSCOPY (EGD) WITH PROPOFOL: SHX5813

## 2014-08-26 SURGERY — ESOPHAGOGASTRODUODENOSCOPY (EGD) WITH PROPOFOL
Anesthesia: Monitor Anesthesia Care

## 2014-08-26 MED ORDER — PROPOFOL 10 MG/ML IV BOLUS
INTRAVENOUS | Status: AC
  Filled 2014-08-26: qty 20

## 2014-08-26 MED ORDER — LACTATED RINGERS IV SOLN
INTRAVENOUS | Status: DC | PRN
Start: 2014-08-26 — End: 2014-08-26
  Administered 2014-08-26: 10:00:00 via INTRAVENOUS

## 2014-08-26 MED ORDER — BUTAMBEN-TETRACAINE-BENZOCAINE 2-2-14 % EX AERO
INHALATION_SPRAY | CUTANEOUS | Status: DC | PRN
  Administered 2014-08-26: 1 via TOPICAL

## 2014-08-26 MED ORDER — LACTATED RINGERS IV SOLN
Freq: Once | INTRAVENOUS | Status: AC
  Administered 2014-08-26: 1000 mL via INTRAVENOUS

## 2014-08-26 MED ORDER — SODIUM CHLORIDE 0.9 % IV SOLN: INTRAVENOUS | Status: DC

## 2014-08-26 MED ORDER — PROPOFOL 10 MG/ML IV BOLUS
INTRAVENOUS | Status: DC | PRN
  Administered 2014-08-26 (×3): 20 mg via INTRAVENOUS
  Administered 2014-08-26: 10 mg via INTRAVENOUS
  Administered 2014-08-26 (×2): 20 mg via INTRAVENOUS
  Administered 2014-08-26: 10 mg via INTRAVENOUS
  Administered 2014-08-26: 20 mg via INTRAVENOUS

## 2014-08-26 MED ORDER — ONDANSETRON HCL 4 MG/2ML IJ SOLN
INTRAMUSCULAR | Status: DC | PRN
  Administered 2014-08-26: 4 mg via INTRAVENOUS

## 2014-08-26 MED ORDER — MIDAZOLAM HCL 5 MG/5ML IJ SOLN
INTRAMUSCULAR | Status: DC | PRN
  Administered 2014-08-26: 1 mg via INTRAVENOUS

## 2014-08-26 MED ORDER — MIDAZOLAM HCL 2 MG/2ML IJ SOLN
INTRAMUSCULAR | Status: AC
  Filled 2014-08-26: qty 2

## 2014-08-26 MED ORDER — LIDOCAINE HCL 1 % IJ SOLN
INTRAMUSCULAR | Status: DC | PRN
  Administered 2014-08-26: 50 mg via INTRADERMAL

## 2014-08-26 SURGICAL SUPPLY — 15 items

## 2014-08-26 NOTE — Op Note (Signed)
[facility] [place], [address]   ENDOSCOPY PROCEDURE REPORT  PATIENT: St. Foster, [name]  MR#: [id number] BIRTHDATE: 1940/07/13 , 62  yrs. old GENDER: male ENDOSCOPIST: [name], MD, [facility] PROCEDURE DATE:  08/26/2014 PROCEDURE:  EGD w/ biopsy ASA CLASS:     Class III INDICATIONS:  history of Barrett's esophagus. MEDICATIONS: Monitored anesthesia care and Per Anesthesia TOPICAL ANESTHETIC: none DESCRIPTION OF PROCEDURE: After the risks benefits and alternatives of the procedure were thoroughly explained, informed consent was obtained.  The Pentax Gastroscope [id number] endoscope was introduced through the mouth and advanced to the second portion of the duodenum , Without limitations.  The instrument was slowly withdrawn as the mucosa was fully examined.    ESOPHAGUS: There was a 2cm segment of Barrett's esophagus in the distal esophagus.  The length of circumferential Barrett's was 1cm (Prague C1) and total length was 2cm (Prague M2).  There was no nodular mucosa noted in the Barrett's segment.  Barrett's Surveillance (4 quadrant biopsies).  Four quadrant biopsies for were taken every 1cm within the Barrett's segment.  The esophagus otherwise appeared normal. DUODENUM: A single nodule was located in the bulb.  It measured 4 mm.  Multiple biopsies was performed. The rest of the bulb and descending duodenum appeared norma. STOMACH: The mucosa and folds of the stomach appeared normal. Retroflexed views revealed a small hiatal hernia.     The scope was then withdrawn from the patient and the procedure completed.  COMPLICATIONS: There were no immediate complications.  ENDOSCOPIC IMPRESSION: 1.   Barrett's esophagus in the distal esophagus; multiple biopsies performed 2.   Nodule in the duodenal bulb; multiple biopsies performed 3.   Small hiatal hernia  RECOMMENDATIONS: 1.  Anti-reflux regimen long term 2.  Continue PPI daily long term 3.   Await pathology results 4.  Resume Pradaxa in 2 days 5.  EGD in 3 years  if no dysplasia  eSigned:  [name], MD, [facility] 08/26/2014 11:22 AM

## 2014-08-26 NOTE — Anesthesia Preprocedure Evaluation (Signed)
Anesthesia Evaluation  Patient identified by MRN, date of birth, ID band Patient awake    Reviewed: Allergy & Precautions, H&P , NPO status , Patient's Chart, lab work & pertinent test results, reviewed documented beta blocker date and time   History of Anesthesia Complications (+) DIFFICULT AIRWAY  Airway Mallampati: II  TM Distance: >3 FB Neck ROM: full    Dental no notable dental hx. (+) Dental Advisory Given   Pulmonary asthma , sleep apnea and Continuous Positive Airway Pressure Ventilation , COPDformer smoker,  breath sounds clear to auscultation  Pulmonary exam normal       Cardiovascular Exercise Tolerance: Good Normal cardiovascular exam+ dysrhythmias Atrial Fibrillation Rhythm:regular Rate:Normal  ECG - AF   Neuro/Psych negative neurological ROS  negative psych ROS   GI/Hepatic negative GI ROS, Neg liver ROS, GERD-  Medicated and Controlled,  Endo/Other  negative endocrine ROS  Renal/GU negative Renal ROS  negative genitourinary   Musculoskeletal   Abdominal   Peds  Hematology negative hematology ROS (+)   Anesthesia Other Findings   Reproductive/Obstetrics negative OB ROS                             Anesthesia Physical Anesthesia Plan  ASA: III  Anesthesia Plan: MAC   Post-op Pain Management:    Induction:   Airway Management Planned:   Additional Equipment:   Intra-op Plan:   Post-operative Plan:   Informed Consent: I have reviewed the patients History and Physical, chart, labs and discussed the procedure including the risks, benefits and alternatives for the proposed anesthesia with the patient or authorized representative who has indicated his/her understanding and acceptance.   Dental Advisory Given  Plan Discussed with: CRNA and Surgeon  Anesthesia Plan Comments:         Anesthesia Quick Evaluation

## 2014-08-26 NOTE — Anesthesia Postprocedure Evaluation (Signed)
  Anesthesia Post-op Note  Patient: [name].  Procedure(s) Performed: Procedure(s) (LRB): ESOPHAGOGASTRODUODENOSCOPY (EGD) WITH PROPOFOL (N/A)  Patient Location: PACU  Anesthesia Type: MAC  Level of Consciousness: awake and alert   Airway and Oxygen Therapy: Patient Spontanous Breathing  Post-op Pain: mild  Post-op Assessment: Post-op Vital signs reviewed, Patient's Cardiovascular Status Stable, Respiratory Function Stable, Patent Airway and No signs of Nausea or vomiting  Last Vitals:  Filed Vitals:   08/26/14 1155  BP:   Pulse: 62  Temp:   Resp: 20    Post-op Vital Signs: stable   Complications: No apparent anesthesia complications

## 2014-08-26 NOTE — H&P (Addendum)
History of Present Illness: This is a 74 year old male with a history of Barrett's esophagus diagnosed in 2002. Last EGD in June 2011 did not show Barrett's on biopsies. He was recommended to have a 3 year surveillance endoscopy and he is now returning. He is maintained on Pradaxa for atrial fibrillation. Reflux symptoms are well-controlled on omeprazole 20 mg daily.  Review of Systems: Pertinent positive and negative review of systems were noted in the above HPI section. All other review of systems were otherwise negative.  Current Medications, Allergies, Past Medical History, Past Surgical History, Family History and Social History were reviewed in [employer] record.  Physical Exam: General: Well developed, well nourished, no acute distress Head: Normocephalic and atraumatic Eyes: sclerae anicteric, EOMI Ears: Normal auditory acuity Mouth: No deformity or lesions Neck: Supple, no masses or thyromegaly Lungs: Clear throughout to auscultation Heart: Regular rate and rhythm; no murmurs, rubs or bruits Abdomen: Soft, non tender and non distended. No masses, hepatosplenomegaly or hernias noted. Normal Bowel sounds Musculoskeletal: Symmetrical with no gross deformities  Skin: No lesions on visible extremities Pulses: Normal pulses noted Extremities: No clubbing, cyanosis, edema or deformities noted Neurological: Alert oriented x 4, grossly nonfocal Cervical Nodes: No significant cervical adenopathy Inguinal Nodes: No significant inguinal adenopathy Psychological: Alert and cooperative. Normal mood and affect  Assessment and Recommendations:  1. Atrial fibrillation maintained on Pradaxa. Will hold Pradaxa for 2 days prior to endoscopic procedures - will instruct when and how to resume after procedure. Additional rare but real risk of stroke or other vascular clotting events off Pradaxa so explained and need to seek urgent help if any signs of these problems occur.  Will communicate by phone or EMR with patient's prescribing provider to confirm that holding Pradaxa is reasonable in this case.   2. Barrett's esophagus, overdue for surveillance EGD. Continue omeprazole 20 mg daily and standard antireflux measures. The risks (including bleeding, perforation, infection, missed lesions, medication reactions and possible hospitalization or surgery if complications occur), benefits, and alternatives to endoscopy with possible biopsy and possible dilation were discussed with the patient and they consent to proceed.

## 2014-08-26 NOTE — Transfer of Care (Signed)
Immediate Anesthesia Transfer of Care Note  Patient: [name].  Procedure(s) Performed: Procedure(s): ESOPHAGOGASTRODUODENOSCOPY (EGD) WITH PROPOFOL (N/A)  Patient Location: PACU and Endoscopy Unit  Anesthesia Type:MAC  Level of Consciousness: oriented and patient cooperative  Airway & Oxygen Therapy: Patient Spontanous Breathing and Patient connected to nasal cannula oxygen  Post-op Assessment: Report given to RN and Post -op Vital signs reviewed and stable  Post vital signs: Reviewed and stable  Last Vitals:  Filed Vitals:   08/26/14 0950  Temp: 36.8 C  Resp: 15    Complications: No apparent anesthesia complications

## 2014-08-26 NOTE — Discharge Instructions (Signed)
Hiatal Hernia A hiatal hernia occurs when part of your stomach slides above the muscle that separates your abdomen from your chest (diaphragm). You can be born with a hiatal hernia (congenital), or it may develop over time. In almost all cases of hiatal hernia, only the top part of the stomach pushes through.  Many people have a hiatal hernia with no symptoms. The larger the hernia, the more likely that you will have symptoms. In some cases, a hiatal hernia allows stomach acid to flow back into the tube that carries food from your mouth to your stomach (esophagus). This may cause heartburn symptoms. Severe heartburn symptoms may mean you have developed a condition called gastroesophageal reflux disease (GERD).  CAUSES  Hiatal hernias are caused by a weakness in the opening (hiatus) where your esophagus passes through your diaphragm to attach to the upper part of your stomach. You may be born with a weakness in your hiatus, or a weakness can develop. RISK FACTORS Older age is a major risk factor for a hiatal hernia. Anything that increases pressure on your diaphragm can also increase your risk of a hiatal hernia. This includes:  Pregnancy.  Excess weight.  Frequent constipation. SIGNS AND SYMPTOMS  People with a hiatal hernia often have no symptoms. If symptoms develop, they are almost always caused by GERD. They may include:  Heartburn.  Belching.  Indigestion.  Trouble swallowing.  Coughing or wheezing.  Sore throat.  Hoarseness.  Chest pain. DIAGNOSIS  A hiatal hernia is sometimes found during an exam for another problem. Your health care provider may suspect a hiatal hernia if you have symptoms of GERD. Tests may be done to diagnose GERD. These may include:  X-rays of your stomach or chest.  An upper gastrointestinal (GI) series. This is an X-ray exam of your GI tract involving the use of a chalky liquid that you swallow. The liquid shows up clearly on the X-ray.  Endoscopy.  This is a procedure to look into your stomach using a thin, flexible tube that has a tiny camera and light on the end of it. TREATMENT  If you have no symptoms, you may not need treatment. If you have symptoms, treatment may include:  Dietary and lifestyle changes to help reduce GERD symptoms.  Medicines. These may include:  Over-the-counter antacids.  Medicines that make your stomach empty more quickly.  Medicines that block the production of stomach acid (H2 blockers).  Stronger medicines to reduce stomach acid (proton pump inhibitors).  You may need surgery to repair the hernia if other treatments are not helping. HOME CARE INSTRUCTIONS   Take all medicines as directed by your health care provider.  Quit smoking, if you smoke.  Try to achieve and maintain a healthy body weight.  Eat frequent small meals instead of three large meals a day. This keeps your stomach from getting too full.  Eat slowly.  Do not lie down right after eating.  Do noteat 1-2 hours before bed.   Do not drink beverages with caffeine. These include cola, coffee, cocoa, and tea.  Do not drink alcohol.  Avoid foods that can make symptoms of GERD worse. These may include:  Fatty foods.  Citrus fruits.  Other foods and drinks that contain acid.  Avoid putting pressure on your belly. Anything that puts pressure on your belly increases the amount of acid that may be pushed up into your esophagus.   Avoid bending over, especially after eating.  Raise the head of your bed  by putting blocks under the legs. This keeps your head and esophagus higher than your stomach.  Do not wear tight clothing around your chest or stomach.  Try not to strain when having a bowel movement, when urinating, or when lifting heavy objects. SEEK MEDICAL CARE IF:  Your symptoms are not controlled with medicines or lifestyle changes.  You are having trouble swallowing.  You have coughing or wheezing that will not  go away. SEEK IMMEDIATE MEDICAL CARE IF:  Your pain is getting worse.  Your pain spreads to your arms, neck, jaw, teeth, or back.  You have shortness of breath.  You sweat for no reason.  You feel sick to your stomach (nauseous) or vomit.  You vomit blood.  You have bright red blood in your stools.  You have black, tarry stools.  Document Released: 05/07/2003 Document Revised: 07/01/2013 Document Reviewed: 02/01/2013 [facility] Patient Information 2015 [place], [place]. This information is not intended to replace advice given to you by your health care provider. Make sure you discuss any questions you have with your health care provider. Barrett's Esophagus Barrett's esophagus occurs when the lining of the esophagus is damaged. The esophagus is the tube that carries food from the mouth to the stomach. With Barrett's esophagus, the lining of the esophagus gets replaced by material that is similar to the lining in the intestines. This process is called intestinal metaplasia. A small number of people with Barrett's esophagus develop esophageal cancer. CAUSES  The exact cause of Barrett's esophagus is unknown. SYMPTOMS  Most people with Barrett's esophagus do not have symptoms. However, many patients also have gastroesophageal reflux disease (GERD). GERD can cause heartburn, trouble swallowing, and a dry cough. DIAGNOSIS Barrett's esophagus is diagnosed by an exam called upper gastrointestinal endoscopy. A thin, flexible tube (endoscope) is passed down the esophagus. The endoscope has a light and camera on the end. Your caregiver uses the endoscope to view the inside of the esophagus. A tissue sample may also be taken and examined under a microscope (biopsy). If cancer cells are found during the biopsy, this condition is called dysplasia. TREATMENT  If you have no dysplasia or low-grade dysplasia, your caregiver may recommend no treatment or only taking medicines to treat GERD. Sometimes,  taking acid-blocking drugs to treat GERD helps improve the tissue affected by Barrett's esophagus. Your caregiver may also recommend periodic esophageal exams. If you have high-grade dysplasia, treatment may include removing the damaged parts of the esophagus. This can be done by heating, freezing, or surgically removing the tissue. In some cases, surgery may be done to remove most of the esophagus. The stomach is then attached to the remaining portion of the esophagus. HOME CARE INSTRUCTIONS  Take acid-blocking drugs for GERD if recommended by your caregiver.  Keep all follow-up appointments as directed by your caregiver. You may need periodic esophageal exams. SEEK IMMEDIATE MEDICAL CARE IF:  You have chest pain.  You have trouble swallowing.  You vomit blood or material that looks like coffee grounds.  Your stools are bright red or dark. Document Released: 05/07/2003 Document Revised: 08/16/2011 Document Reviewed: 04/26/2011 [facility] Patient Information 2015 [place], [place]. This information is not intended to replace advice given to you by your health care provider. Make sure you discuss any questions you have with your health care provider. Esophagogastroduodenoscopy Care After Refer to this sheet in the next few weeks. These instructions provide you with information on caring for yourself after your procedure. Your caregiver may also give you more  specific instructions. Your treatment has been planned according to current medical practices, but problems sometimes occur. Call your caregiver if you have any problems or questions after your procedure.  HOME CARE INSTRUCTIONS  Do not eat or drink anything until the numbing medicine (local anesthetic) has worn off and your gag reflex has returned. You will know that the local anesthetic has worn off when you can swallow comfortably.  Do not drive for 12 hours after the procedure or as directed by your caregiver.  Only take medicines as  directed by your caregiver. SEEK MEDICAL CARE IF:   You cannot stop coughing.  You are not urinating at all or less than usual. SEEK IMMEDIATE MEDICAL CARE IF:  You have difficulty swallowing.  You cannot eat or drink.  You have worsening throat or chest pain.  You have dizziness, lightheadedness, or you faint.  You have nausea or vomiting.  You have chills.  You have a fever.  You have severe abdominal pain.  You have black, tarry, or bloody stools. Document Released: 02/01/2012 Document Reviewed: 02/01/2012 [facility] Patient Information 2015 [place]. This information is not intended to replace advice given to you by your health care provider. Make sure you discuss any questions you have with your health care provider.

## 2014-08-27 ENCOUNTER — Encounter (HOSPITAL_COMMUNITY): Payer: Self-pay | Admitting: Gastroenterology

## 2014-08-28 ENCOUNTER — Encounter: Payer: Self-pay | Admitting: Gastroenterology

## 2014-09-26 ENCOUNTER — Other Ambulatory Visit: Payer: Self-pay | Admitting: Gastroenterology

## 2014-10-08 ENCOUNTER — Encounter: Payer: Self-pay | Admitting: Gastroenterology

## 2014-11-20 LAB — BASIC METABOLIC PANEL
BUN: 17 mg/dL (ref 7–25)
CALCIUM: 9.3 mg/dL (ref 8.6–10.3)
CO2: 28 mmol/L (ref 20–31)
CREATININE: 1.25 mg/dL — AB (ref 0.70–1.18)
Chloride: 101 mmol/L (ref 98–110)
GLUCOSE: 97 mg/dL (ref 65–99)
Potassium: 4.6 mmol/L (ref 3.5–5.3)
Sodium: 138 mmol/L (ref 135–146)

## 2014-11-20 LAB — LIPID PANEL
CHOL/HDL RATIO: 3.3 ratio (ref <=5.0)
CHOLESTEROL: 179 mg/dL (ref 125–200)
HDL: 54 mg/dL (ref >=40)
LDL Cholesterol: 112 mg/dL (ref <130)
Triglycerides: 67 mg/dL (ref <150)
VLDL: 13 mg/dL (ref <30)

## 2014-11-20 LAB — HEPATIC FUNCTION PANEL
ALK PHOS: 68 U/L (ref 40–115)
ALT: 13 U/L (ref 9–46)
AST: 18 U/L (ref 10–35)
Albumin: 4 g/dL (ref 3.6–5.1)
BILIRUBIN DIRECT: 0.1 mg/dL (ref <=0.2)
BILIRUBIN INDIRECT: 0.5 mg/dL (ref 0.2–1.2)
BILIRUBIN TOTAL: 0.6 mg/dL (ref 0.2–1.2)
Total Protein: 7 g/dL (ref 6.1–8.1)

## 2014-11-25 ENCOUNTER — Other Ambulatory Visit: Payer: Self-pay | Admitting: Cardiology

## 2014-11-25 NOTE — Telephone Encounter (Signed)
REFILL 

## 2014-11-27 ENCOUNTER — Ambulatory Visit (INDEPENDENT_AMBULATORY_CARE_PROVIDER_SITE_OTHER): Payer: Medicare PPO | Admitting: Cardiology

## 2014-11-27 ENCOUNTER — Encounter: Payer: Self-pay | Admitting: Cardiology

## 2014-11-27 ENCOUNTER — Other Ambulatory Visit: Payer: Self-pay

## 2014-11-27 VITALS — BP 130/70 | HR 74 | Ht 70.0 in | Wt 177.2 lb

## 2014-11-27 DIAGNOSIS — I482 Chronic atrial fibrillation, unspecified: Secondary | ICD-10-CM

## 2014-11-27 MED ORDER — DABIGATRAN ETEXILATE MESYLATE 150 MG PO CAPS: 150.0000 mg | ORAL_CAPSULE | Freq: Two times a day (BID) | ORAL | Status: DC

## 2014-11-27 NOTE — Patient Instructions (Signed)
Continue your current therapy  I will see you again in one year.   

## 2014-11-27 NOTE — Progress Notes (Signed)
[name]. Date of Birth: 03/05/1940 Medical Record #[id number]  History of Present Illness: [name] is seen for follow up Afib.He has a history of atrial fibrillation dating back to 2012. Initially this was paroxysmal but multiple Ecgs since then show Afib with controlled rate. He is managed with metoprolol, diltiazem, and pradaxa. Typical HR is 60-90. When he works out in gym Center may go up to 140. He works out vigorously every other day. He denies any chest pain, SOB, or palpitations. No dizziness. Notes slight ankle swelling intermittently. He did have upper EGD in June which showed Barrett's esophagus without dysplasia or malignancy.     Medication List       This list is accurate as of: 11/27/14  8:16 AM.  Always use your most recent med list.               albuterol 108 (90 BASE) MCG/ACT inhaler  Commonly known as:  PROVENTIL HFA;VENTOLIN HFA  Inhale 2 puffs into the lungs every 4 (four) hours as needed for wheezing or shortness of breath (cough, shortness of breath or wheezing.).     AMBULATORY NON FORMULARY MEDICATION  CPAP at night     AMBULATORY NON FORMULARY MEDICATION  UDO's Oil 3-6-9 Blend Take 1 tbsp every day     AMBULATORY NON FORMULARY MEDICATION  Cardio Protegen Liquid 2 tbsp every morning     AMBULATORY NON FORMULARY MEDICATION  Inner Eco Liquid Take 1 tbsp every morning     AMBULATORY NON FORMULARY MEDICATION  Herbal Aloe Take 2 tbsp every morning     AMBULATORY NON FORMULARY MEDICATION  Carnivora  Take 1 tablet twice a day     beclomethasone 80 MCG/ACT inhaler  Commonly known as:  QVAR  Inhale 1 puff into the lungs 2 (two) times daily.     cetirizine 10 MG tablet  Commonly known as:  ZYRTEC  Take 10 mg by mouth as needed for allergies.     dabigatran 150 MG Caps capsule  Commonly known as:  PRADAXA  Take 1 capsule (150 mg total) by mouth 2 (two) times daily.     diltiazem 180 MG 24 hr capsule  Commonly known as:  CARTIA XT  Take 1  capsule (180 mg total) by mouth daily. TAKE 1 CAPSULE BY MOUTH DAILY     finasteride 5 MG tablet  Commonly known as:  PROSCAR  Take 1 tablet by mouth daily.     fluticasone 50 MCG/ACT nasal spray  Commonly known as:  FLONASE  INSTILL 2 SPRAYS IN EACH NOSTRIL EVERY DAY AS DIRECTED     metoprolol succinate 50 MG 24 hr tablet  Commonly known as:  TOPROL-XL  Take 1 tablet (50 mg total) by mouth daily. TAKE 1 TABLET BY MOUTH EVERY DAY WITH OR FOLLOWING A MEAL     omeprazole 20 MG capsule  Commonly known as:  PRILOSEC  TAKE 1 CAPSULE BY MOUTH EVERY DAY     PREVAGEN PO  Take 1 tablet by mouth daily.     zolpidem 5 MG tablet  Commonly known as:  AMBIEN  Take 0.5-1 tablets (2.5-5 mg total) by mouth at bedtime as needed. For anxiety/insomnia         Allergies  Allergen Reactions  . Proair Hfa [Albuterol]     Only use in emergency due to has heart problems    Past Medical History  Diagnosis Date  . Atrial fibrillation   . Barrett's esophagus   .  Paroxysmal atrial fibrillation     Hospitalized on 05/27/10  . GERD (gastroesophageal reflux disease)   . BPH (benign prostatic hyperplasia)   . Barrett esophagus   . Hiatal hernia   . OSA (obstructive sleep apnea) 08/05/10 [facility]    AHI 24  . Chronic obstructive asthma   . Allergy   . Renal cell carcinoma 2009    right renal mass, followed by Dr. Ander Slade  . Difficult intubation 13 or 14 years ago, at [facility] long    airway was up near nostrils with intubation per patient with bowel surgery  . Hammer toe of right foot     Past Surgical History  Procedure Laterality Date  . Exploratory laparotomy, segmental small bowel resection of a hemangioma, repair of incarcerated umbelical hernia  37/1062  . Exploratory laparotomy w/ bowel resection  12/1999    segmental sbr of a hemangioma, repair of incarcerated umbilical hernia  . Renal cancer surgery with rf ablation    . Tonsillectomy    . Basal cell carcinoma excision    . Eye  surgery      cataracts done  . Esophagogastroduodenoscopy (egd) with propofol N/A 08/26/2014    Procedure: ESOPHAGOGASTRODUODENOSCOPY (EGD) WITH PROPOFOL;  Surgeon: [name], MD;  Location: [facility] ENDOSCOPY;  Service: Endoscopy;  Laterality: N/A;    Social History   Social History  . Marital Status: Single    Spouse Name: N/A  . Number of Children: N/A  . Years of Education: N/A   Occupational History  . tv producer/construction   . televison Secretary/administrator    Social History Main Topics  . Smoking status: Former Smoker -- 10 years    Types: Cigars    Quit date: 02/29/1980  . Smokeless tobacco: Never Used     Comment: quit when he was 73 years old  . Alcohol Use: No  . Drug Use: No  . Sexual Activity: Not Currently   Other Topics Concern  . None   Social History Narrative   ** Merged History Encounter ** Married. Education: [employer]. Exercise: Eleptical times a week for 45 minutes.           Family History  Problem Relation Age of Onset  . Stroke Mother   . Diabetes Mother   . Obesity Brother   . Diabetes Brother   . Asthma Paternal Grandmother   . Heart attack Paternal Grandfather   . Kidney cancer Paternal Aunt   . Colon cancer Neg Hx   . Diabetes Father     Review of Systems: As noted in HPI.  All other systems were reviewed and are negative.  Physical Exam: BP 130/70 mmHg  Pulse 74  Ht 5\' 10"  (1.778 m)  Wt 80.377 kg (177 lb 3.2 oz)  BMI 25.43 kg/m2 Filed Weights   11/27/14 0801  Weight: 80.377 kg (177 lb 3.2 oz)   GENERAL:  Well appearing WM in NAD HEENT:  PERRL, EOMI, sclera are clear. Oropharynx is clear. NECK:  No jugular venous distention, carotid upstroke brisk and symmetric, no bruits, no thyromegaly or adenopathy LUNGS:  Clear to auscultation bilaterally CHEST:  Unremarkable HEART:  IRRR,  PMI not displaced or sustained,S1 and S2 within normal limits, no S3, no S4: no clicks, no rubs, no murmurs ABD:  Soft, nontender. BS +, no masses or  bruits. No hepatomegaly, no splenomegaly EXT:  2 + pulses throughout, no edema, no cyanosis no clubbing SKIN:  Warm and dry.  No rashes NEURO:  Alert and  oriented x 3. Cranial nerves II through XII intact. PSYCH:  Cognitively intact   LABORATORY DATA:  Lab Results  Component Value Date   WBC 4.9 09/16/2013   HGB 14.3 09/16/2013   HCT 41.6 09/16/2013   PLT 212 09/16/2013   GLUCOSE 97 11/19/2014   CHOL 179 11/19/2014   TRIG 67 11/19/2014   HDL 54 11/19/2014   LDLCALC 112 11/19/2014   ALT 13 11/19/2014   AST 18 11/19/2014   NA 138 11/19/2014   K 4.6 11/19/2014   CL 101 11/19/2014   CREATININE 1.25* 11/19/2014   BUN 17 11/19/2014   CO2 28 11/19/2014   TSH 2.005 11/19/2012   PSA 2.79 11/19/2012   INR 1.17 05/18/2011    Assessment / Plan: 1. Atrial fibrillation. Permanent.  Rate well controlled. Continue rate control medications and anticoagulation. I will follow up in one year.  2. CKD stage 2. Stable.  3. Barrett's esophagus.

## 2014-12-01 ENCOUNTER — Encounter: Payer: Medicare PPO | Admitting: Family Medicine

## 2014-12-27 ENCOUNTER — Other Ambulatory Visit: Payer: Self-pay | Admitting: Cardiology

## 2015-01-08 ENCOUNTER — Ambulatory Visit
Admission: RE | Admit: 2015-01-08 | Discharge: 2015-01-08 | Disposition: A | Discharge status: Home / Self Care | Payer: Medicare PPO | Source: Ambulatory Visit | Attending: Family Medicine | Admitting: Family Medicine

## 2015-01-08 ENCOUNTER — Other Ambulatory Visit: Payer: Self-pay | Admitting: Family Medicine

## 2015-01-08 DIAGNOSIS — M542 Cervicalgia: Secondary | ICD-10-CM

## 2015-01-14 ENCOUNTER — Other Ambulatory Visit: Payer: Self-pay | Admitting: Family Medicine

## 2015-01-14 DIAGNOSIS — M50322 Other cervical disc degeneration at C5-C6 level: Secondary | ICD-10-CM

## 2015-01-16 ENCOUNTER — Ambulatory Visit
Admission: RE | Admit: 2015-01-16 | Discharge: 2015-01-16 | Disposition: A | Discharge status: Home / Self Care | Payer: Medicare PPO | Source: Ambulatory Visit | Attending: Family Medicine | Admitting: Family Medicine

## 2015-01-16 ENCOUNTER — Other Ambulatory Visit: Payer: Medicare PPO

## 2015-01-16 DIAGNOSIS — M50322 Other cervical disc degeneration at C5-C6 level: Secondary | ICD-10-CM

## 2015-03-27 ENCOUNTER — Ambulatory Visit (INDEPENDENT_AMBULATORY_CARE_PROVIDER_SITE_OTHER): Payer: Medicare PPO | Admitting: Podiatry

## 2015-03-27 ENCOUNTER — Encounter: Payer: Self-pay | Admitting: Podiatry

## 2015-03-27 VITALS — BP 132/80 | HR 67 | Resp 16 | Ht 70.0 in | Wt 183.0 lb

## 2015-03-27 DIAGNOSIS — Q828 Other specified congenital malformations of skin: Secondary | ICD-10-CM | POA: Diagnosis not present

## 2015-03-27 NOTE — Progress Notes (Signed)
   Subjective:    Patient ID: [name]., male    DOB: September 03, 1940, 75 y.o.   MRN: [id number]  HPI this patient presents to the office with a painful callus on the bottom of his right forefoot. He says he was treated in my office in June 2016 and has been pain free since the callus was to provided at that point, he says the pain from the callus has now returned and he presents the office for further evaluation and treatment    Review of Systems  All other systems reviewed and are negative.      Objective:   Physical Exam GENERAL APPEARANCE: Alert, conversant. Appropriately groomed. No acute distress.  VASCULAR: Pedal pulses palpable at  [facility] and PT bilateral.  Capillary refill time is immediate to all digits,  Normal temperature gradient.  Digital hair growth is present bilateral  NEUROLOGIC: sensation is normal to 5.07 monofilament at 5/5 sites bilateral.  Light touch is intact bilateral, Muscle strength normal.  MUSCULOSKELETAL: acceptable muscle strength, tone and stability bilateral.  Intrinsic muscluature intact bilateral.  Rectus appearance of foot and digits noted bilateral. Hammer toe deformity 2 nd B/L  DERMATOLOGIC: skin color, texture, and turgor are within normal limits.  No preulcerative lesions or ulcers  are seen, no interdigital maceration noted.  No open lesions present.  . No drainage noted. Porokeratosis sub 2,3 right forefoot. NAILS  Asymptomatic onychomycosis B/L        Assessment & Plan:  Porokeratosis Right Foot.  Debride porokeratosis.  RTC prn  Gardiner Barefoot DPM

## 2015-05-21 ENCOUNTER — Ambulatory Visit (INDEPENDENT_AMBULATORY_CARE_PROVIDER_SITE_OTHER): Payer: Medicare PPO | Admitting: Podiatry

## 2015-05-21 ENCOUNTER — Encounter: Payer: Self-pay | Admitting: Podiatry

## 2015-05-21 DIAGNOSIS — Q828 Other specified congenital malformations of skin: Secondary | ICD-10-CM

## 2015-05-21 DIAGNOSIS — M79676 Pain in unspecified toe(s): Secondary | ICD-10-CM | POA: Diagnosis not present

## 2015-05-21 DIAGNOSIS — B351 Tinea unguium: Secondary | ICD-10-CM | POA: Diagnosis not present

## 2015-05-21 NOTE — Progress Notes (Signed)
Patient ID: [name]., male   DOB: January 11, 1941, 75 y.o.   MRN: [id number] Complaint:  Visit Type: Patient returns to my office for continued preventative foot care services. Complaint: Patient states" my nails have grown long and thick and become painful to walk and wear shoes". The patient presents for preventative foot care services. No changes to ROS.  Painful callus both feet.  Podiatric Exam: Vascular: dorsalis pedis and posterior tibial pulses are palpable bilateral. Capillary return is immediate. Temperature gradient is WNL. Skin turgor WNL  Sensorium: Normal Semmes Weinstein monofilament test. Normal tactile sensation bilaterally. Nail Exam: Pt has thick disfigured discolored nails with subungual debris noted bilateral entire nail hallux through fifth toenails Ulcer Exam: There is no evidence of ulcer or pre-ulcerative changes or infection. Orthopedic Exam: Muscle tone and strength are WNL. No limitations in general ROM. No crepitus or effusions noted. Foot type and digits show no abnormalities. Bony prominences are unremarkable. Skin:  Porokeratosis sub 3 right and dorsolateral aspect 5th metatarsal left foot.. No infection or ulcers  Diagnosis:  Onychomycosis, , Pain in right toe, pain in left toes Porokeratosis B/L  Treatment & Plan Procedures and Treatment: Consent by patient was obtained for treatment procedures. The patient understood the discussion of treatment and procedures well. All questions were answered thoroughly reviewed. Debridement of mycotic and hypertrophic toenails, 1 through 5 bilateral and clearing of subungual debris. No ulceration, no infection noted. Debride porokeratosis B/ Return Visit-Office Procedure: Patient instructed to return to the office for a follow up visit 10 weeks  for continued evaluation and treatment.    Gardiner Barefoot DPM

## 2015-06-09 ENCOUNTER — Ambulatory Visit (INDEPENDENT_AMBULATORY_CARE_PROVIDER_SITE_OTHER): Payer: Medicare PPO | Admitting: Physician Assistant

## 2015-06-09 ENCOUNTER — Encounter: Payer: Self-pay | Admitting: Physician Assistant

## 2015-06-09 VITALS — BP 130/64 | HR 77 | Temp 98.2°F | Resp 16

## 2015-06-09 DIAGNOSIS — S61412A Laceration without foreign body of left hand, initial encounter: Secondary | ICD-10-CM

## 2015-06-09 NOTE — Patient Instructions (Addendum)
IF you received an x-ray today, you will receive an invoice from [facility] Radiology. Please contact [facility]iology at [phone] with questions or concerns regarding your invoice.   IF you received labwork today, you will receive an invoice from [employer]. Please contact [facility] at [phone] with questions or concerns regarding your invoice.   Our billing staff will not be able to assist you with questions regarding bills from these companies.  You will be contacted with the lab results as soon as they are available. The fastest way to get your results is to activate your My Chart account. Instructions are located on the last page of this paperwork. If you have not heard from [place] regarding the results in 2 weeks, please contact this office.    Keep the wound covered for 24 hours.  You can then take off the bandage and was it twice per day with soap and water.  You may opt to wear a bandage during the day, so that the wound does not snag against materials.   Please return for alarming symptoms as we discussed.  Laceration Care, Adult A laceration is a cut that goes through all of the layers of the skin and into the tissue that is right under the skin. Some lacerations heal on their own. Others need to be closed with stitches (sutures), staples, skin adhesive strips, or skin glue. Proper laceration care minimizes the risk of infection and helps the laceration to heal better. HOW TO CARE FOR YOUR LACERATION If sutures or staples were used:  Keep the wound clean and dry.  If you were given a bandage (dressing), you should change it at least one time per day or as told by your health care provider. You should also change it if it becomes wet or dirty.  Keep the wound completely dry for the first 24 hours or as told by your health care provider. After that time, you may shower or bathe. However, make sure that the wound is not soaked in water until after  the sutures or staples have been removed.  Clean the wound one time each day or as told by your health care provider:  Wash the wound with soap and water.  Rinse the wound with water to remove all soap.  Pat the wound dry with a clean towel. Do not rub the wound.  After cleaning the wound, apply a thin layer of antibiotic ointmentas told by your health care provider. This will help to prevent infection and keep the dressing from sticking to the wound.  Have the sutures or staples removed as told by your health care provider. If skin adhesive strips were used:  Keep the wound clean and dry.  If you were given a bandage (dressing), you should change it at least one time per day or as told by your health care provider. You should also change it if it becomes dirty or wet.  Do not get the skin adhesive strips wet. You may shower or bathe, but be careful to keep the wound dry.  If the wound gets wet, pat it dry with a clean towel. Do not rub the wound.  Skin adhesive strips fall off on their own. You may trim the strips as the wound heals. Do not remove skin adhesive strips that are still stuck to the wound. They will fall off in time. If skin glue was used:  Try to keep the wound dry, but you may briefly wet  it in the shower or bath. Do not soak the wound in water, such as by swimming.  After you have showered or bathed, gently pat the wound dry with a clean towel. Do not rub the wound.  Do not do any activities that will make you sweat heavily until the skin glue has fallen off on its own.  Do not apply liquid, cream, or ointment medicine to the wound while the skin glue is in place. Using those may loosen the film before the wound has healed.  If you were given a bandage (dressing), you should change it at least one time per day or as told by your health care provider. You should also change it if it becomes dirty or wet.  If a dressing is placed over the wound, be careful not to  apply tape directly over the skin glue. Doing that may cause the glue to be pulled off before the wound has healed.  Do not pick at the glue. The skin glue usually remains in place for 5-10 days, then it falls off of the skin. General Instructions  Take over-the-counter and prescription medicines only as told by your health care provider.  If you were prescribed an antibiotic medicine or ointment, take or apply it as told by your doctor. Do not stop using it even if your condition improves.  To help prevent scarring, make sure to cover your wound with sunscreen whenever you are outside after stitches are removed, after adhesive strips are removed, or when glue remains in place and the wound is healed. Make sure to wear a sunscreen of at least 30 SPF.  Do not scratch or pick at the wound.  Keep all follow-up visits as told by your health care provider. This is important.  Check your wound every day for signs of infection. Watch for:  Redness, swelling, or pain.  Fluid, blood, or pus.  Raise (elevate) the injured area above the level of your heart while you are sitting or lying down, if possible. SEEK MEDICAL CARE IF:  You received a tetanus shot and you have swelling, severe pain, redness, or bleeding at the injection site.  You have a fever.  A wound that was closed breaks open.  You notice a bad smell coming from your wound or your dressing.  You notice something coming out of the wound, such as wood or glass.  Your pain is not controlled with medicine.  You have increased redness, swelling, or pain at the site of your wound.  You have fluid, blood, or pus coming from your wound.  You notice a change in the color of your skin near your wound.  You need to change the dressing frequently due to fluid, blood, or pus draining from the wound.  You develop a new rash.  You develop numbness around the wound. SEEK IMMEDIATE MEDICAL CARE IF:  You develop severe swelling  around the wound.  Your pain suddenly increases and is severe.  You develop painful lumps near the wound or on skin that is anywhere on your body.  You have a red streak going away from your wound.  The wound is on your hand or foot and you cannot properly move a finger or toe.  The wound is on your hand or foot and you notice that your fingers or toes look pale or bluish.   This information is not intended to replace advice given to you by your health care provider. Make sure you discuss any  questions you have with your health care provider.   Document Released: 02/14/2005 Document Revised: 07/01/2014 Document Reviewed: 02/10/2014 Elsevier Interactive Patient Education [employer].

## 2015-06-09 NOTE — Progress Notes (Addendum)
[facility] [address] [place] [address] [phone]  Date:  06/09/2015   Name:  [name].   DOB:  07/25/1940   MRN:  [id number]  PCP:  Marylene Land, MD    History of Present Illness:  [name]. is a 75 y.o. male patient who presents to [facility] for cc of laceration of hand. Yesterday afternoon, patient tripped near his door. He he believes that his hand hit the metal latch of the door frame. He then was having considerable bleeding in this area. He attempted to cleanse this. In place a dressing on it. He became concerned when he would not stop bleeding.  Patient is up-to-date on his tetanus. He reports no numbness or tingling of the hand. There is been no swelling. He has no change in the range of motion. No fever or purulent drainage.   Patient Active Problem List   Diagnosis Date Noted  . Duodenal nodule   . BPH (benign prostatic hyperplasia) 11/07/2011  . Hip pain 07/04/2011  . Perianal itch 07/04/2011  . OSA (obstructive sleep apnea) 08/08/2010  . Chronic obstructive asthma ([place]) 07/14/2010  . Rhinitis 07/14/2010  . Cough 07/13/2010  . Barrett's esophagus 03/11/2009  . Atrial fibrillation ([place]) 07/29/2008  . GERD 07/29/2008    Past Medical History  Diagnosis Date  . Atrial fibrillation ([place])   . Barrett's esophagus   . Paroxysmal atrial fibrillation ([place])     Hospitalized on 05/27/10  . GERD (gastroesophageal reflux disease)   . BPH (benign prostatic hyperplasia)   . Barrett esophagus   . Hiatal hernia   . OSA (obstructive sleep apnea) 08/05/10 PSG    AHI 24  . Chronic obstructive asthma   . Allergy   . Renal cell carcinoma 2009    right renal mass, followed by Dr. Ander Slade  . Difficult intubation 13 or 14 years ago, at [facility] long    airway was up near nostrils with intubation per patient with bowel surgery  . Hammer toe of right foot     Past Surgical History  Procedure Laterality Date  . Exploratory laparotomy,  segmental small bowel resection of a hemangioma, repair of incarcerated umbelical hernia  [id number]  . Exploratory laparotomy w/ bowel resection  12/1999    segmental sbr of a hemangioma, repair of incarcerated umbilical hernia  . Renal cancer surgery with rf ablation    . Tonsillectomy    . Basal cell carcinoma excision    . Eye surgery      cataracts done  . Esophagogastroduodenoscopy (egd) with propofol N/A 08/26/2014    Procedure: ESOPHAGOGASTRODUODENOSCOPY (EGD) WITH PROPOFOL;  Surgeon: [name], MD;  Location: [facility] ENDOSCOPY;  Service: Endoscopy;  Laterality: N/A;    Social History  Substance Use Topics  . Smoking status: Former Smoker -- 10 years    Types: Cigars    Quit date: 02/29/1980  . Smokeless tobacco: Never Used     Comment: quit when he was 75 years old  . Alcohol Use: No    Family History  Problem Relation Age of Onset  . Stroke Mother   . Diabetes Mother   . Obesity Brother   . Diabetes Brother   . Asthma Paternal Grandmother   . Heart attack Paternal Grandfather   . Kidney cancer Paternal Aunt   . Colon cancer Neg Hx   . Diabetes Father     Allergies  Allergen Reactions  . Proair Hfa [Albuterol]  Palpitations    Only use in emergency due to has heart problems    Medication list has been reviewed and updated.  Current Outpatient Prescriptions on File Prior to Visit  Medication Sig Dispense Refill  . albuterol (PROVENTIL HFA;VENTOLIN HFA) 108 (90 BASE) MCG/ACT inhaler Inhale 2 puffs into the lungs every 4 (four) hours as needed for wheezing or shortness of breath (cough, shortness of breath or wheezing.). 1 Inhaler 1  . AMBULATORY NON FORMULARY MEDICATION CPAP at night    . AMBULATORY NON FORMULARY MEDICATION UDO's Oil 3-6-9 Blend Take 1 tbsp every day    . AMBULATORY NON FORMULARY MEDICATION Cardio Protegen Liquid 2 tbsp every morning    . AMBULATORY NON FORMULARY MEDICATION Inner Eco Liquid Take 1 tbsp every morning    . AMBULATORY NON  FORMULARY MEDICATION Herbal Aloe Take 2 tbsp every morning    . AMBULATORY NON FORMULARY MEDICATION Carnivora  Take 1 tablet twice a day    . Apoaequorin (PREVAGEN PO) Take 1 tablet by mouth daily.     . beclomethasone (QVAR) 80 MCG/ACT inhaler Inhale 1 puff into the lungs 2 (two) times daily. (Patient taking differently: Inhale 1 puff into the lungs daily. ) 8.7 g 5  . CARTIA XT 180 MG 24 hr capsule TAKE ONE CAPSULE BY MOUTH DAILY 30 capsule 9  . cetirizine (ZYRTEC) 10 MG tablet Take 10 mg by mouth as needed for allergies.     Marland Kitchen dabigatran (PRADAXA) 150 MG CAPS capsule Take 1 capsule (150 mg total) by mouth 2 (two) times daily. 60 capsule 11  . dutasteride (AVODART) 0.5 MG capsule TK 1 C PO QD  11  . finasteride (PROSCAR) 5 MG tablet Take 1 tablet by mouth daily.    . fluticasone (FLONASE) 50 MCG/ACT nasal spray INSTILL 2 SPRAYS IN EACH NOSTRIL EVERY DAY AS DIRECTED 16 g 5  . metoprolol succinate (TOPROL-XL) 50 MG 24 hr tablet TAKE 1 TABLET BY MOUTH EVERY DAY WITH OR FOLLOWING A MEAL 30 tablet 9  . omeprazole (PRILOSEC) 20 MG capsule TAKE 1 CAPSULE BY MOUTH EVERY DAY 30 capsule 11  . zolpidem (AMBIEN) 5 MG tablet Take 0.5-1 tablets (2.5-5 mg total) by mouth at bedtime as needed. For anxiety/insomnia 30 tablet 0   No current facility-administered medications on file prior to visit.    ROS ROS otherwise unremarkable unless listed above.   Physical Examination: BP 130/64 mmHg  Pulse 77  Temp(Src) 98.2 F (36.8 C) (Oral)  Resp 16  SpO2 98% Ideal Body Weight:    Physical Exam  Constitutional: He is oriented to person, place, and time. He appears well-developed and well-nourished. No distress.  HENT:  Head: Normocephalic and atraumatic.  Eyes: Conjunctivae and EOM are normal. Pupils are equal, round, and reactive to light.  Cardiovascular: Normal rate.   Pulmonary/Chest: Effort normal. No respiratory distress.  Neurological: He is alert and oriented to person, place, and time.   Skin: Skin is warm and dry. He is not diaphoretic.  u-shaped oblique laceration at dorsal left hand.  Extends into dermis.  No change in rom.  Normal capillary refill.  Sensation intact.  Psychiatric: He has a normal mood and affect. His behavior is normal.   Procedure: verbal consent obtained.  .5% bupivicaine placed at the wound site.  clenased with soap and water rigorously.  5-0 ethilon sutures utilized to place simple interrupted sutures along the 2.4 cm wound at the dorsum of left hand.  Cleansed with normal saline. Dressings applied.  Assessment and Plan: Arnette Lanoux. is a 75 y.o. male who is here today for left hand laceration -tdap utd -discussed laceration care. --rtc in 7 days for suture removal.  Alarming sxs discussed to warrant a more immediate return.  Hand laceration, left, initial encounter  Ivar Drape, PA-C [facility] 06/09/2015 12:36 PM

## 2015-06-12 ENCOUNTER — Telehealth: Payer: Self-pay | Admitting: Physician Assistant

## 2015-06-12 NOTE — Telephone Encounter (Signed)
In error

## 2015-06-12 NOTE — Progress Notes (Signed)
Please refer to note. Please refer to history of present illness documentation. I could not tell where exactly the laceration was.

## 2015-07-06 ENCOUNTER — Ambulatory Visit (INDEPENDENT_AMBULATORY_CARE_PROVIDER_SITE_OTHER): Payer: Medicare PPO | Admitting: Pulmonary Disease

## 2015-07-06 ENCOUNTER — Encounter: Payer: Self-pay | Admitting: Pulmonary Disease

## 2015-07-06 VITALS — BP 118/78 | HR 77 | Ht 70.0 in | Wt 187.8 lb

## 2015-07-06 DIAGNOSIS — G4733 Obstructive sleep apnea (adult) (pediatric): Secondary | ICD-10-CM | POA: Diagnosis not present

## 2015-07-06 DIAGNOSIS — J4520 Mild intermittent asthma, uncomplicated: Secondary | ICD-10-CM | POA: Diagnosis not present

## 2015-07-06 NOTE — Patient Instructions (Signed)
Try stopping Qvar  Follow up in 1 year

## 2015-07-06 NOTE — Progress Notes (Signed)
Current Outpatient Prescriptions on File Prior to Visit  Medication Sig  . albuterol (PROVENTIL HFA;VENTOLIN HFA) 108 (90 BASE) MCG/ACT inhaler Inhale 2 puffs into the lungs every 4 (four) hours as needed for wheezing or shortness of breath (cough, shortness of breath or wheezing.).  Marland Kitchen AMBULATORY NON FORMULARY MEDICATION CPAP at night  . AMBULATORY NON FORMULARY MEDICATION UDO's Oil 3-6-9 Blend Take 1 tbsp every day  . AMBULATORY NON FORMULARY MEDICATION Cardio Protegen Liquid 2 tbsp every morning  . AMBULATORY NON FORMULARY MEDICATION Inner Eco Liquid Take 1 tbsp every morning  . AMBULATORY NON FORMULARY MEDICATION Herbal Aloe Take 2 tbsp every morning  . AMBULATORY NON FORMULARY MEDICATION Carnivora  Take 1 tablet twice a day  . Apoaequorin (PREVAGEN PO) Take 1 tablet by mouth daily.   . beclomethasone (QVAR) 80 MCG/ACT inhaler Inhale 1 puff into the lungs 2 (two) times daily. (Patient taking differently: Inhale 1 puff into the lungs daily. )  . CARTIA XT 180 MG 24 hr capsule TAKE ONE CAPSULE BY MOUTH DAILY  . cetirizine (ZYRTEC) 10 MG tablet Take 10 mg by mouth as needed for allergies.   Marland Kitchen dabigatran (PRADAXA) 150 MG CAPS capsule Take 1 capsule (150 mg total) by mouth 2 (two) times daily.  Marland Kitchen dutasteride (AVODART) 0.5 MG capsule TK 1 C PO QD  . finasteride (PROSCAR) 5 MG tablet Take 1 tablet by mouth daily.  . fluticasone (FLONASE) 50 MCG/ACT nasal spray INSTILL 2 SPRAYS IN EACH NOSTRIL EVERY DAY AS DIRECTED  . metoprolol succinate (TOPROL-XL) 50 MG 24 hr tablet TAKE 1 TABLET BY MOUTH EVERY DAY WITH OR FOLLOWING A MEAL  . omeprazole (PRILOSEC) 20 MG capsule TAKE 1 CAPSULE BY MOUTH EVERY DAY  . zolpidem (AMBIEN) 5 MG tablet Take 0.5-1 tablets (2.5-5 mg total) by mouth at bedtime as needed. For anxiety/insomnia   No current facility-administered medications on file prior to visit.    Chief Complaint  Patient presents with  . Follow-up    Denies any current issues with asthma. Pt  reports starting "Transformational Breath" every other day and he feels that this will help with his breathing overall in addtiona to normal exercise routine.     Tests PSG 08/05/10>>AHI 24.2, SpO2 low 77%, PLMI 15.8. PFT 07/26/11>>FEV1 2.62(91%), FEV1% 66, TLC 6.55(101%), DLCO 94%, +BD PSG with oral appliance 02/07/12>>AHI 11.5, SpO2 low 78%. Both obstructive and central events. Auto CPAP 01/14/14 to 02/12/14 >> used on 17 of 30 nights with average 4 hrs and 55 min.  Average AHI is 11 with median CPAP 7 cm H2O and 95 th percentile CPAP 9 cm H20.  Mostly central events.  Past medical history Atrial fibrillation, Barrett esophagus, GERD, BPH, hiatal hernia, Renal cell carcinoma 2009  Past surgical history, Family history, Social history, Allergies reviewed  Vital signs BP 118/78 mmHg  Pulse 77  Ht 5\' 10"  (1.778 m)  Wt 187 lb 12.8 oz (85.186 kg)  BMI 26.95 kg/m2  SpO2 98%  History of Present Illness: [name]. is a 75 y.o. male with COPD with asthma, rhinitis and OSA.  He has been doing well with CPAP.  No issues with mask fit.  Occasionally gets mouth dryness >> uses biotene.  He is not having trouble with his breathing.  He uses qvar daily.  Hasn't needed to use albuterol.  His son got him a trip to [place] for plane flight in [employer].  He needs clearance letter to fly.  Physical Exam:  General -  No distress ENT - No sinus tenderness, decreased AP diameter oropharynx, no oral exudate, no LAN Cardiac - s1s2 regular, no murmur Chest - No wheeze/rales/dullness, good air entry, normal respiratory excursion Back - No focal tenderness Abd - Soft, non-tender Ext - No edema Neuro - Normal strength Skin - No rashes Psych - Normal mood, and behavior   Assessment/Plan:  Mild intermittent asthma. - he can try stopping Qvar and monitor his symptoms - continue prn albuterol  OSA >> he is compliant with CPAP and reports benefit. - continue auto CPAP  Upper airway  cough syndrome with PND and GERD. Plan: - continue prn flonase, zyrtec  Patient Instructions  Try stopping Qvar  Follow up in 1 year     [name], MD [facility] Pager:  [phone] 07/06/2015, 12:10 PM

## 2015-07-29 ENCOUNTER — Ambulatory Visit (INDEPENDENT_AMBULATORY_CARE_PROVIDER_SITE_OTHER): Payer: Medicare PPO | Admitting: Podiatry

## 2015-07-29 ENCOUNTER — Encounter: Payer: Self-pay | Admitting: Podiatry

## 2015-07-29 DIAGNOSIS — Q828 Other specified congenital malformations of skin: Secondary | ICD-10-CM

## 2015-07-29 DIAGNOSIS — M79676 Pain in unspecified toe(s): Secondary | ICD-10-CM | POA: Diagnosis not present

## 2015-07-29 DIAGNOSIS — B351 Tinea unguium: Secondary | ICD-10-CM | POA: Diagnosis not present

## 2015-07-29 NOTE — Progress Notes (Signed)
Patient ID: [name]., male   DOB: 1940/04/11, 75 y.o.   MRN: [id number] Complaint:  Visit Type: Patient returns to my office for continued preventative foot care services. Complaint: Patient states" my nails have grown long and thick and become painful to walk and wear shoes". The patient presents for preventative foot care services. No changes to ROS.  Painful callus right foot.  Podiatric Exam: Vascular: dorsalis pedis and posterior tibial pulses are palpable bilateral. Capillary return is immediate. Temperature gradient is WNL. Skin turgor WNL  Sensorium: Normal Semmes Weinstein monofilament test. Normal tactile sensation bilaterally. Nail Exam: Pt has thick disfigured discolored nails with subungual debris noted bilateral entire nail hallux through fifth toenails Ulcer Exam: There is no evidence of ulcer or pre-ulcerative changes or infection. Orthopedic Exam: Muscle tone and strength are WNL. No limitations in general ROM. No crepitus or effusions noted. Foot type and digits show no abnormalities. Bony prominences are unremarkable. Skin:  Porokeratosis sub 3 right . No infection or ulcers  Diagnosis:  Onychomycosis, , Pain in right toe, pain in left toes Porokeratosis   Treatment & Plan Procedures and Treatment: Consent by patient was obtained for treatment procedures. The patient understood the discussion of treatment and procedures well. All questions were answered thoroughly reviewed. Debridement of mycotic and hypertrophic toenails, 1 through 5 bilateral and clearing of subungual debris. No ulceration, no infection noted. Debride porokeratosis sub 3 right.  Patient wearing toe brace second toe right. Return Visit-Office Procedure: Patient instructed to return to the office for a follow up visit 9 weeks  for continued evaluation and treatment.    Gardiner Barefoot DPM

## 2015-09-03 ENCOUNTER — Telehealth: Payer: Self-pay | Admitting: Gastroenterology

## 2015-09-03 NOTE — Telephone Encounter (Signed)
Patient with new onset fecal incontinence.  He is going to see his primary care tomorrow.  He will call back if Dr. Sandi Mariscal wants his appt moved up.  I set him up an appt for 10/12/15 .  He will call me back if he wants to discuss moving up the appt

## 2015-09-04 ENCOUNTER — Telehealth: Payer: Self-pay | Admitting: Gastroenterology

## 2015-09-04 NOTE — Telephone Encounter (Signed)
Patient is rescheduled to 09/09/15 3:00.  Beaumont Austad at Dr. Deboraha Sprang office notified and she will notify the patient

## 2015-09-09 ENCOUNTER — Ambulatory Visit (INDEPENDENT_AMBULATORY_CARE_PROVIDER_SITE_OTHER): Payer: Medicare PPO | Admitting: Gastroenterology

## 2015-09-09 VITALS — BP 136/72 | HR 96 | Ht 69.5 in | Wt 189.4 lb

## 2015-09-09 DIAGNOSIS — R159 Full incontinence of feces: Secondary | ICD-10-CM

## 2015-09-09 DIAGNOSIS — K625 Hemorrhage of anus and rectum: Secondary | ICD-10-CM

## 2015-09-09 DIAGNOSIS — B369 Superficial mycosis, unspecified: Secondary | ICD-10-CM

## 2015-09-09 MED ORDER — FLUCONAZOLE 100 MG PO TABS: 100.0000 mg | ORAL_TABLET | Freq: Every day | ORAL | Status: DC

## 2015-09-09 MED ORDER — KETOCONAZOLE 2 % EX CREA: 1.0000 "application " | TOPICAL_CREAM | Freq: Three times a day (TID) | CUTANEOUS | Status: DC

## 2015-09-09 NOTE — Patient Instructions (Signed)
We have sent the following medications to your pharmacy for you to pick up at your convenience:fluconazole to take one tablet by mouth x 7 days. Also we sent ketoconazole to use rectally three times a day x 2 weeks.  Use only baby wipes after each bowel movement.   Use Desitin barrier cream in between bowel movements to put on perianal area only. Not on the rectum.  RECTAL CARE INSTRUCTIONS:  1. Sitz Baths twice a day for 10 minutes each. 2. Thoroughly clean and dry the rectum. 3. Put Tucks pad against the rectum at night. 4. Clean the rectum with Balenol lotion after each bowel movement.  Thank you for choosing me and [facility].  Pricilla Riffle. Dagoberto Ligas., MD., Marval Regal

## 2015-09-09 NOTE — Progress Notes (Signed)
    History of Present Illness: This is a 75 year old male referred by Dr. Sandi Mariscal for perianal itching, perianal rash, fecal incontinence and small volume rectal bleeding. Symptoms began a few weeks ago. He was evaluated by Dr. Sandi Mariscal and treated for perianal dermatitis with Diflucan tablets and miconazole/hydrocortisone cream. Symptoms have not resolved. The patient notes minor bleeding only after anal wiping. He has not noted change in bowel habits prior this. Fecal incontinence began after the rash developed and he has not previously had problems with incontinence. He previously underwent colonoscopy in June 2011 which was normal.   Current Medications, Allergies, Past Medical History, Past Surgical History, Family History and Social History were reviewed in [employer] record.  Physical Exam: General: Well developed, well nourished, no acute distress Head: Normocephalic and atraumatic Eyes:  sclerae anicteric, EOMI Ears: Normal auditory acuity Mouth: No deformity or lesions Lungs: Clear throughout to auscultation Heart: Regular rate and rhythm; no murmurs, rubs or bruits Abdomen: Soft, non tender and non distended. No masses, hepatosplenomegaly or hernias noted. Normal Bowel sounds Rectal: marked perianal dermatitis, normal sphincter tone, slightly decreased anal squeeze, heme negative soft brown stool in vault.  Musculoskeletal: Symmetrical with no gross deformities  Pulses:  Normal pulses noted Extremities: No clubbing, cyanosis, edema or deformities noted Neurological: Alert oriented x 4, grossly nonfocal Psychological:  Alert and cooperative. Normal mood and affect  Assessment:  1. Perianal/anal dermatitis. Suspected candida.  2. Fecal incontinence, likely secondary to #1.  3. Small volume hematochezia with wiping, likely secondary to #1.    Recommendations:   Standard rectal care instructions. Baby wipes only for next month. Ketoconazole cream  perianally TID for 2 weeks. Desitin ointment perianally TID 2-3 hours after Ketoconazole. Fluconazole 100 mg po daily for 7 days. Consider colonoscopy if minor rectal bleeding persists. If symptoms do not respond consider dermatology evaluation. REV in 4 weeks with me or APP.   cc: Derinda Late, MD

## 2015-09-28 ENCOUNTER — Other Ambulatory Visit: Payer: Self-pay | Admitting: Gastroenterology

## 2015-09-30 ENCOUNTER — Encounter: Payer: Self-pay | Admitting: Podiatry

## 2015-09-30 ENCOUNTER — Ambulatory Visit (INDEPENDENT_AMBULATORY_CARE_PROVIDER_SITE_OTHER): Payer: Medicare PPO | Admitting: Podiatry

## 2015-09-30 ENCOUNTER — Ambulatory Visit: Payer: Medicare PPO | Admitting: Podiatry

## 2015-09-30 DIAGNOSIS — B351 Tinea unguium: Secondary | ICD-10-CM

## 2015-09-30 DIAGNOSIS — Q828 Other specified congenital malformations of skin: Secondary | ICD-10-CM

## 2015-09-30 DIAGNOSIS — M79676 Pain in unspecified toe(s): Secondary | ICD-10-CM

## 2015-09-30 NOTE — Progress Notes (Signed)
Patient ID: [name]., male   DOB: Apr 28, 1940, 75 y.o.   MRN: [id number] Complaint:  Visit Type: Patient returns to my office for continued preventative foot care services. Complaint: Patient states" my nails have grown long and thick and become painful to walk and wear shoes". The patient presents for preventative foot care services. No changes to ROS.  Painful callus right foot.  Podiatric Exam: Vascular: dorsalis pedis and posterior tibial pulses are palpable bilateral. Capillary return is immediate. Temperature gradient is WNL. Skin turgor WNL  Sensorium: Normal Semmes Weinstein monofilament test. Normal tactile sensation bilaterally. Nail Exam: Pt has thick disfigured discolored nails with subungual debris noted bilateral entire nail hallux through fifth toenails Ulcer Exam: There is no evidence of ulcer or pre-ulcerative changes or infection. Orthopedic Exam: Muscle tone and strength are WNL. No limitations in general ROM. No crepitus or effusions noted. Foot type and digits show no abnormalities. Bony prominences are unremarkable. Skin:  Porokeratosis sub 3 right . No infection or ulcers  Diagnosis:  Onychomycosis, , Pain in right toe, pain in left toes Porokeratosis   Treatment & Plan Procedures and Treatment: Consent by patient was obtained for treatment procedures. The patient understood the discussion of treatment and procedures well. All questions were answered thoroughly reviewed. Debridement of mycotic and hypertrophic toenails, 1 through 5 bilateral and clearing of subungual debris. No ulceration, no infection noted. Debride porokeratosis sub 3 right.  Patient wearing toe brace second toe right. Return Visit-Office Procedure: Patient instructed to return to the office for a follow up visit 9 weeks  for continued evaluation and treatment.    Gardiner Barefoot DPM

## 2015-10-06 ENCOUNTER — Ambulatory Visit: Payer: Medicare PPO | Admitting: Physician Assistant

## 2015-10-12 ENCOUNTER — Ambulatory Visit: Payer: Medicare PPO | Admitting: Gastroenterology

## 2015-10-26 ENCOUNTER — Encounter: Payer: Self-pay | Admitting: Physician Assistant

## 2015-10-26 ENCOUNTER — Ambulatory Visit (INDEPENDENT_AMBULATORY_CARE_PROVIDER_SITE_OTHER): Payer: Medicare PPO | Admitting: Physician Assistant

## 2015-10-26 VITALS — BP 110/72 | HR 74 | Ht 70.0 in | Wt 190.1 lb

## 2015-10-26 DIAGNOSIS — L309 Dermatitis, unspecified: Secondary | ICD-10-CM

## 2015-10-26 NOTE — Progress Notes (Signed)
Subjective:    Patient ID: [name]., male    DOB: October 28, 1940, 75 y.o.   MRN: [id number]  HPI [name] is a pleasant 75 year old white male who comes in today for follow-up after visit with Dr. Fuller Foster in July 2017 with perianal itching and rash which was felt most consistent with perianal candidiasis. Patient was given a course of ketoconazole ointment 3 times a day 2 weeks, Desitin ointment 3 times a day 2 weeks and fluconazole tablets 100 mg 7 days. He also did a course of sitz bath for 2 weeks. He says he feels much better at this point and hasn't had any symptoms since he finished the medications about 3 weeks ago. He has not seen any evidence of rectal bleeding on the tissue or in his stools. No complaints of rectal discomfort.   Patient also has history of Barrett's esophagus and will be due for follow-up exam in his June 2019 Last colonoscopy 2011 was normal.  Review of Systems Pertinent positive and negative review of systems were noted in the above HPI section.  All other review of systems was otherwise negative.  Outpatient Encounter Prescriptions as of 10/26/2015  Medication Sig  . albuterol (PROVENTIL HFA;VENTOLIN HFA) 108 (90 BASE) MCG/ACT inhaler Inhale 2 puffs into the lungs every 4 (four) hours as needed for wheezing or shortness of breath (cough, shortness of breath or wheezing.).  Marland Kitchen AMBULATORY NON FORMULARY MEDICATION CPAP at night  . AMBULATORY NON FORMULARY MEDICATION UDO's Oil 3-6-9 Blend Take 1 tbsp every day  . AMBULATORY NON FORMULARY MEDICATION Cardio Protegen Liquid 2 tbsp every morning  . AMBULATORY NON FORMULARY MEDICATION Inner Eco Liquid Take 1 tbsp every morning  . AMBULATORY NON FORMULARY MEDICATION Herbal Aloe Take 2 tbsp every morning  . AMBULATORY NON FORMULARY MEDICATION Carnivora  Take 1 tablet twice a day  . Apoaequorin (PREVAGEN PO) Take 1 tablet by mouth daily.   . beclomethasone (QVAR) 80 MCG/ACT inhaler Inhale 1 puff into the lungs 2 (two)  times daily. (Patient taking differently: Inhale 1 puff into the lungs daily. )  . CARTIA XT 180 MG 24 hr capsule TAKE ONE CAPSULE BY MOUTH DAILY  . cetirizine (ZYRTEC) 10 MG tablet Take 10 mg by mouth as needed for allergies.   Marland Kitchen dabigatran (PRADAXA) 150 MG CAPS capsule Take 1 capsule (150 mg total) by mouth 2 (two) times daily.  Marland Kitchen dutasteride (AVODART) 0.5 MG capsule TK 1 C PO QD  . finasteride (PROSCAR) 5 MG tablet Take 1 tablet by mouth daily.  . fluconazole (DIFLUCAN) 100 MG tablet Take 1 tablet (100 mg total) by mouth daily.  . fluticasone (FLONASE) 50 MCG/ACT nasal spray INSTILL 2 SPRAYS IN EACH NOSTRIL EVERY DAY AS DIRECTED  . metoprolol succinate (TOPROL-XL) 50 MG 24 hr tablet TAKE 1 TABLET BY MOUTH EVERY DAY WITH OR FOLLOWING A MEAL  . omeprazole (PRILOSEC) 20 MG capsule TAKE ONE CAPSULE BY MOUTH EVERY DAY  . zolpidem (AMBIEN) 5 MG tablet Take 0.5-1 tablets (2.5-5 mg total) by mouth at bedtime as needed. For anxiety/insomnia  . [DISCONTINUED] ketoconazole (NIZORAL) 2 % cream Apply 1 application topically 3 (three) times daily. X 2 weeks   No facility-administered encounter medications on file as of 10/26/2015.    Allergies  Allergen Reactions  . Proair Hfa [Albuterol] Palpitations    Only use in emergency due to has heart problems   Patient Active Problem List   Diagnosis Date Noted  . Duodenal nodule   .  BPH (benign prostatic hyperplasia) 11/07/2011  . Hip pain 07/04/2011  . Perianal itch 07/04/2011  . OSA (obstructive sleep apnea) 08/08/2010  . Chronic obstructive asthma ([place]) 07/14/2010  . Rhinitis 07/14/2010  . Cough 07/13/2010  . Barrett's esophagus 03/11/2009  . Atrial fibrillation ([place]) 07/29/2008  . GERD 07/29/2008   Social History   Social History  . Marital status: Single    Spouse name: N/A  . Number of children: N/A  . Years of education: N/A   Occupational History  . tv producer/construction   . televison Secretary/administrator    Social History Main Topics  .  Smoking status: Former Smoker    Years: 10.00    Types: Cigars    Quit date: 02/29/1980  . Smokeless tobacco: Never Used     Comment: quit when he was 75 years old  . Alcohol use No  . Drug use: No  . Sexual activity: Not Currently   Other Topics Concern  . Not on file   Social History Narrative   ** Merged History Encounter ** Married. Education: [employer]. Exercise: Eleptical times a week for 45 minutes.           [name] family history includes Asthma in his paternal grandmother; Diabetes in his brother, father, and mother; Heart attack in his paternal grandfather; Kidney cancer in his paternal aunt; Obesity in his brother; Stroke in his mother.      Objective:    Vitals:   10/26/15 0830  BP: 110/72  Pulse: 74    Physical Exam  well-developed older white male in no acute distress, pleasant blood pressure 110/72 pulse 74. HEENT; nontraumatic normocephalic EOMI PERRLA sclera anicteric, Cardiovascular; regular rate and rhythm with S1-S2 no murmur rub or gallop, Pulmonary; clear bilaterally, Abdomen; soft, nontender no palpable mass or hepatosplenomegaly bowel sounds are present, Rectal; exam, no digital exam done on examination of the perianal area there is no evidence of rash, erythema or excoriations he does have a little fecal staining. Extremities; no clubbing cyanosis or edema skin warm and dry, Neuropsych ;mood and affect appropriate       Assessment & Foster:   #68 75 year old white male with perianal candidiasis resolved #2 small volume hematochezia likely secondary to perianal irritation also resolved #3 history of Barrett's esophagus-last exam 2016  Foster; Patient is advised to continue to use moistened tissue to keep the perianal area clean, and we will start with Desitin ointment if he has any recurrence of irritation. Pt  will be put in for recall EGD for June 2019 with Dr. Fuller Foster for follow-up of Barrett's He will be due for follow-up colonoscopy in 2021 for  screening He will follow-up with Dr. Fuller Foster or myself on an as-needed basis.   [name] S Esterwood PA-C 10/26/2015   Cc: [name] Late, MD

## 2015-10-26 NOTE — Progress Notes (Signed)
Reviewed and agree with management plan.  Anaelle Dunton T. Eshaal Duby, MD FACG 

## 2015-10-26 NOTE — Patient Instructions (Addendum)
Follow up as needed with Dr. Fuller Plan.  There is a recall date of 07/2017 for your next Endoscopy with Dr. Fuller Plan.

## 2015-10-27 ENCOUNTER — Other Ambulatory Visit: Payer: Self-pay | Admitting: Cardiology

## 2015-10-27 NOTE — Telephone Encounter (Signed)
Rx request sent to pharmacy.  

## 2015-10-29 ENCOUNTER — Other Ambulatory Visit: Payer: Self-pay | Admitting: Cardiology

## 2015-10-29 MED ORDER — DILTIAZEM HCL ER COATED BEADS 180 MG PO CP24: 180.0000 mg | ORAL_CAPSULE | Freq: Every day | ORAL | 11 refills | Status: DC

## 2015-10-29 NOTE — Telephone Encounter (Signed)
Rx request sent to pharmacy.  

## 2015-12-02 ENCOUNTER — Telehealth: Payer: Self-pay | Admitting: Cardiology

## 2015-12-02 ENCOUNTER — Encounter: Payer: Self-pay | Admitting: Podiatry

## 2015-12-02 ENCOUNTER — Ambulatory Visit (INDEPENDENT_AMBULATORY_CARE_PROVIDER_SITE_OTHER): Payer: Medicare PPO | Admitting: Podiatry

## 2015-12-02 VITALS — BP 128/78 | HR 75 | Resp 14

## 2015-12-02 DIAGNOSIS — B351 Tinea unguium: Secondary | ICD-10-CM

## 2015-12-02 DIAGNOSIS — M79676 Pain in unspecified toe(s): Secondary | ICD-10-CM | POA: Diagnosis not present

## 2015-12-02 DIAGNOSIS — Q828 Other specified congenital malformations of skin: Secondary | ICD-10-CM | POA: Diagnosis not present

## 2015-12-02 NOTE — Progress Notes (Signed)
Patient ID: [name]., male   DOB: 08/27/1940, 75 y.o.   MRN: [id number] Complaint:  Visit Type: Patient returns to my office for continued preventative foot care services. Complaint: Patient states" my nails have grown long and thick and become painful to walk and wear shoes". The patient presents for preventative foot care services. No changes to ROS.  Painful callus right foot.  Podiatric Exam: Vascular: dorsalis pedis and posterior tibial pulses are palpable bilateral. Capillary return is immediate. Temperature gradient is WNL. Skin turgor WNL  Sensorium: Normal Semmes Weinstein monofilament test. Normal tactile sensation bilaterally. Nail Exam: Pt has thick disfigured discolored nails with subungual debris noted bilateral entire nail hallux through fifth toenails Ulcer Exam: There is no evidence of ulcer or pre-ulcerative changes or infection. Orthopedic Exam: Muscle tone and strength are WNL. No limitations in general ROM. No crepitus or effusions noted. Foot type and digits show no abnormalities. Bony prominences are unremarkable. Skin:  Porokeratosis sub 3 right . No infection or ulcers  Diagnosis:  Onychomycosis, , Pain in right toe, pain in left toes Porokeratosis   Treatment & Plan Procedures and Treatment: Consent by patient was obtained for treatment procedures. The patient understood the discussion of treatment and procedures well. All questions were answered thoroughly reviewed. Debridement of mycotic and hypertrophic toenails, 1 through 5 bilateral and clearing of subungual debris. No ulceration, no infection noted. Debride porokeratosis sub 3 right.   Return Visit-Office Procedure: Patient instructed to return to the office for a follow up visit 10 weeks  for continued evaluation and treatment.    Gardiner Barefoot DPM

## 2015-12-02 NOTE — Telephone Encounter (Signed)
He needs dental surgery,wants to know about stopping his Pradaxa. Please call ,so he can schedule his surgery.

## 2015-12-03 NOTE — Telephone Encounter (Signed)
Returned call to patient no answer.LMTC. 

## 2015-12-04 NOTE — Telephone Encounter (Signed)
Follow up ° ° ° ° ° °Returning a call to the nurse °

## 2015-12-04 NOTE — Telephone Encounter (Signed)
He may hold Pradaxa for 2 days prior to oral surgery then resume post procedure.  Alee Gressman [place] MD, [facility] '

## 2015-12-04 NOTE — Telephone Encounter (Signed)
Returned call to patient.He stated he needs to have oral surgery for a infected root.Stated oral surgeon wants him to hold Pradaxa.Advised I will send message to [place] for advice.

## 2015-12-07 NOTE — Telephone Encounter (Signed)
Returned call to patient.Dr.Jordan advised ok to hold Pradaxa 2 days prior to dental surgery then resume post procedure.

## 2015-12-08 NOTE — Telephone Encounter (Signed)
Note faxed to oral surgeon Dr.Stanley at fax # [phone].

## 2015-12-16 ENCOUNTER — Telehealth: Payer: Self-pay | Admitting: Cardiology

## 2015-12-16 NOTE — Telephone Encounter (Signed)
refaxed to number provided.

## 2015-12-16 NOTE — Telephone Encounter (Signed)
New message      Dr Dorothyann Peng did not get the clearance to hold pradaxa.  Please refax it to [phone]

## 2015-12-27 ENCOUNTER — Other Ambulatory Visit: Payer: Self-pay | Admitting: Cardiology

## 2016-01-07 ENCOUNTER — Encounter: Payer: Self-pay | Admitting: Cardiology

## 2016-01-07 ENCOUNTER — Ambulatory Visit (INDEPENDENT_AMBULATORY_CARE_PROVIDER_SITE_OTHER): Payer: Medicare PPO | Admitting: Cardiology

## 2016-01-07 VITALS — BP 133/81 | HR 61 | Ht 70.0 in | Wt 187.0 lb

## 2016-01-07 DIAGNOSIS — I482 Chronic atrial fibrillation, unspecified: Secondary | ICD-10-CM

## 2016-01-07 NOTE — Progress Notes (Signed)
[name]. Date of Birth: 22-Dec-1940 Medical Record [id number]  History of Present Illness: Mr. [name] is seen for follow up Afib.He has a history of atrial fibrillation dating back to 2012. Initially this was paroxysmal but multiple Ecgs since then show Afib with controlled rate. He is managed with metoprolol, diltiazem, and pradaxa. Typical HR is 60-90. When he works out in gym [place] may go up to 140. He works out vigorously every other day for one hour. He denies any chest pain, SOB, or palpitations. No dizziness. Notes slight ankle swelling intermittently. He still uses CPAP.     Medication List       Accurate as of 01/07/16  4:25 PM. Always use your most recent med list.          albuterol 108 (90 Base) MCG/ACT inhaler Commonly known as:  PROVENTIL HFA;VENTOLIN HFA Inhale 2 puffs into the lungs every 4 (four) hours as needed for wheezing or shortness of breath (cough, shortness of breath or wheezing.).   AMBULATORY NON FORMULARY MEDICATION CPAP at night   AMBULATORY NON FORMULARY MEDICATION UDO's Oil 3-6-9 Blend Take 1 tbsp every day   AMBULATORY NON FORMULARY MEDICATION Cardio Protegen Liquid 2 tbsp every morning   AMBULATORY NON FORMULARY MEDICATION Inner Eco Liquid Take 1 tbsp every morning   AMBULATORY NON FORMULARY MEDICATION Herbal Aloe Take 2 tbsp every morning   AMBULATORY NON FORMULARY MEDICATION Carnivora  Take 1 tablet twice a day   beclomethasone 80 MCG/ACT inhaler Commonly known as:  QVAR Inhale 1 puff into the lungs 2 (two) times daily.   cetirizine 10 MG tablet Commonly known as:  ZYRTEC Take 10 mg by mouth as needed for allergies.   dabigatran 150 MG Caps capsule Commonly known as:  PRADAXA Take 1 capsule (150 mg total) by mouth 2 (two) times daily.   diltiazem 180 MG 24 hr capsule Commonly known as:  CARTIA XT Take 1 capsule (180 mg total) by mouth daily.   dutasteride 0.5 MG capsule Commonly known as:  AVODART TK 1 C PO QD     finasteride 5 MG tablet Commonly known as:  PROSCAR Take 1 tablet by mouth daily.   fluconazole 100 MG tablet Commonly known as:  DIFLUCAN Take 1 tablet (100 mg total) by mouth daily.   fluticasone 50 MCG/ACT nasal spray Commonly known as:  FLONASE INSTILL 2 SPRAYS IN EACH NOSTRIL EVERY DAY AS DIRECTED   metoprolol succinate 50 MG 24 hr tablet Commonly known as:  TOPROL-XL TAKE 1 TABLET BY MOUTH EVERY DAY WITH OR FOLLOWING A MEAL   omeprazole 20 MG capsule Commonly known as:  PRILOSEC TAKE ONE CAPSULE BY MOUTH EVERY DAY   PREVAGEN PO Take 1 tablet by mouth daily.   zolpidem 5 MG tablet Commonly known as:  AMBIEN Take 0.5-1 tablets (2.5-5 mg total) by mouth at bedtime as needed. For anxiety/insomnia        Allergies  Allergen Reactions  . Proair Hfa [Albuterol] Palpitations    Only use in emergency due to has heart problems    Past Medical History:  Diagnosis Date  . Allergy   . Atrial fibrillation ([place])   . Barrett esophagus   . Barrett's esophagus   . BPH (benign prostatic hyperplasia)   . Chronic obstructive asthma   . Difficult intubation 13 or 14 years ago, at [facility] long   airway was up near nostrils with intubation per patient with bowel surgery  . ED (erectile dysfunction)   .  GERD (gastroesophageal reflux disease)   . Hammer toe of right foot   . Hiatal hernia   . OSA (obstructive sleep apnea) 08/05/10 [facility]   AHI 24  . Paroxysmal atrial fibrillation ([place])    Hospitalized on 05/27/10  . Renal cell carcinoma 2009   right renal mass, followed by Dr. Ander Slade    Past Surgical History:  Procedure Laterality Date  . BASAL CELL CARCINOMA EXCISION    . ESOPHAGOGASTRODUODENOSCOPY (EGD) WITH PROPOFOL N/A 08/26/2014   Procedure: ESOPHAGOGASTRODUODENOSCOPY (EGD) WITH PROPOFOL;  Surgeon: [name], MD;  Location: [facility] ENDOSCOPY;  Service: Endoscopy;  Laterality: N/A;  . EXPLORATORY LAPAROTOMY W/ BOWEL RESECTION  12/1999   segmental sbr of a hemangioma,  repair of incarcerated umbilical hernia  . exploratory laparotomy, segmental small bowel resection of a hemangioma, repair of incarcerated umbelical hernia  [id number]  . EYE SURGERY     cataracts done  . renal cancer surgery with RF Ablation    . TONSILLECTOMY      Social History   Social History  . Marital status: Single    Spouse name: N/A  . Number of children: N/A  . Years of education: N/A   Occupational History  . tv producer/construction   . televison Secretary/administrator    Social History Main Topics  . Smoking status: Former Smoker    Years: 10.00    Types: Cigars    Quit date: 02/29/1980  . Smokeless tobacco: Never Used     Comment: quit when he was 75 years old  . Alcohol use No  . Drug use: No  . Sexual activity: Not Currently   Other Topics Concern  . None   Social History Narrative   ** Merged History Encounter ** Married. Education: [employer]. Exercise: Eleptical times a week for 45 minutes.           Family History  Problem Relation Age of Onset  . Stroke Mother   . Diabetes Mother   . Obesity Brother   . Diabetes Brother   . Diabetes Father   . Asthma Paternal Grandmother   . Heart attack Paternal Grandfather   . Kidney cancer Paternal Aunt   . Colon cancer Neg Hx     Review of Systems: As noted in HPI.  All other systems were reviewed and are negative.  Physical Exam: BP 133/81   Pulse 61   Ht 5\' 10"  (1.778 m)   Wt 187 lb (84.8 kg)   BMI 26.83 kg/m  Filed Weights   01/07/16 1612  Weight: 187 lb (84.8 kg)   GENERAL:  Well appearing WM in NAD HEENT:  PERRL, EOMI, sclera are clear. Oropharynx is clear. NECK:  No jugular venous distention, carotid upstroke brisk and symmetric, no bruits, no thyromegaly or adenopathy LUNGS:  Clear to auscultation bilaterally CHEST:  Unremarkable HEART:  IRRR,  PMI not displaced or sustained,S1 and S2 within normal limits, no S3, no S4: no clicks, no rubs, no murmurs ABD:  Soft, nontender. BS +, no masses or bruits.  No hepatomegaly, no splenomegaly EXT:  2 + pulses throughout, no edema, no cyanosis no clubbing SKIN:  Warm and dry.  No rashes NEURO:  Alert and oriented x 3. Cranial nerves II through XII intact. PSYCH:  Cognitively intact   LABORATORY DATA:  Lab Results  Component Value Date   WBC 4.9 09/16/2013   HGB 14.3 09/16/2013   HCT 41.6 09/16/2013   PLT 212 09/16/2013   GLUCOSE 97 11/19/2014   CHOL  179 11/19/2014   TRIG 67 11/19/2014   HDL 54 11/19/2014   LDLCALC 112 11/19/2014   ALT 13 11/19/2014   AST 18 11/19/2014   NA 138 11/19/2014   K 4.6 11/19/2014   CL 101 11/19/2014   CREATININE 1.25 (H) 11/19/2014   BUN 17 11/19/2014   CO2 28 11/19/2014   TSH 2.005 11/19/2012   PSA 2.79 11/19/2012   INR 1.17 05/18/2011   Ecg today shows Afib with rate 67. Otherwise normal. I have personally reviewed and interpreted this study.  Assessment / Plan: 1. Atrial fibrillation. Permanent.  Rate well controlled. Continue rate control medications and anticoagulation. I will follow up in one year. He is due to have lab work with Dr. Sandi Mariscal.  2. CKD stage 2. Stable.  3. Barrett's esophagus.

## 2016-01-07 NOTE — Patient Instructions (Signed)
Continue your current therapy  I will see you in one year   

## 2016-01-28 ENCOUNTER — Other Ambulatory Visit: Payer: Self-pay

## 2016-01-28 MED ORDER — DABIGATRAN ETEXILATE MESYLATE 150 MG PO CAPS: 150.0000 mg | ORAL_CAPSULE | Freq: Two times a day (BID) | ORAL | 6 refills | Status: DC

## 2016-01-29 ENCOUNTER — Other Ambulatory Visit: Payer: Self-pay

## 2016-01-29 MED ORDER — DABIGATRAN ETEXILATE MESYLATE 150 MG PO CAPS: 150.0000 mg | ORAL_CAPSULE | Freq: Two times a day (BID) | ORAL | 6 refills | Status: DC

## 2016-02-10 ENCOUNTER — Ambulatory Visit (INDEPENDENT_AMBULATORY_CARE_PROVIDER_SITE_OTHER): Payer: Medicare PPO | Admitting: Podiatry

## 2016-02-10 ENCOUNTER — Encounter: Payer: Self-pay | Admitting: Podiatry

## 2016-02-10 VITALS — Ht 70.0 in | Wt 187.0 lb

## 2016-02-10 DIAGNOSIS — B351 Tinea unguium: Secondary | ICD-10-CM

## 2016-02-10 DIAGNOSIS — M79676 Pain in unspecified toe(s): Secondary | ICD-10-CM

## 2016-02-10 NOTE — Progress Notes (Signed)
Patient ID: [name]., male   DOB: September 17, 1940, 75 y.o.   MRN: [id number] Complaint:  Visit Type: Patient returns to my office for continued preventative foot care services. Complaint: Patient states" my nails have grown long and thick and become painful to walk and wear shoes". The patient presents for preventative foot care services. No changes to ROS.  Painful callus right foot.  Podiatric Exam: Vascular: dorsalis pedis and posterior tibial pulses are palpable bilateral. Capillary return is immediate. Temperature gradient is WNL. Skin turgor WNL  Sensorium: Normal Semmes Weinstein monofilament test. Normal tactile sensation bilaterally. Nail Exam: Pt has thick disfigured discolored nails with subungual debris noted bilateral entire nail hallux through fifth toenails Ulcer Exam: There is no evidence of ulcer or pre-ulcerative changes or infection. Orthopedic Exam: Muscle tone and strength are WNL. No limitations in general ROM. No crepitus or effusions noted. Foot type and digits show no abnormalities. Bony prominences are unremarkable. Skin:  Asymptomatic  Porokeratosis sub 3 right . No infection or ulcers  Diagnosis:  Onychomycosis, , Pain in right toe, pain in left toes Porokeratosis   Treatment & Plan Procedures and Treatment: Consent by patient was obtained for treatment procedures. The patient understood the discussion of treatment and procedures well. All questions were answered thoroughly reviewed. Debridement of mycotic and hypertrophic toenails, 1 through 5 bilateral and clearing of subungual debris. No ulceration, no infection noted.   Return Visit-Office Procedure: Patient instructed to return to the office for a follow up visit 10 weeks  for continued evaluation and treatment.    Gardiner Barefoot DPM

## 2016-02-25 ENCOUNTER — Ambulatory Visit (INDEPENDENT_AMBULATORY_CARE_PROVIDER_SITE_OTHER): Payer: Medicare PPO | Admitting: Family Medicine

## 2016-02-25 VITALS — BP 136/82 | HR 86 | Temp 97.5°F | Ht 70.0 in | Wt 191.0 lb

## 2016-02-25 DIAGNOSIS — R430 Anosmia: Secondary | ICD-10-CM | POA: Insufficient documentation

## 2016-02-25 DIAGNOSIS — L03115 Cellulitis of right lower limb: Secondary | ICD-10-CM

## 2016-02-25 DIAGNOSIS — H833X3 Noise effects on inner ear, bilateral: Secondary | ICD-10-CM | POA: Insufficient documentation

## 2016-02-25 DIAGNOSIS — J324 Chronic pansinusitis: Secondary | ICD-10-CM | POA: Insufficient documentation

## 2016-02-25 DIAGNOSIS — H9113 Presbycusis, bilateral: Secondary | ICD-10-CM | POA: Insufficient documentation

## 2016-02-25 DIAGNOSIS — H6123 Impacted cerumen, bilateral: Secondary | ICD-10-CM | POA: Insufficient documentation

## 2016-02-25 MED ORDER — CEPHALEXIN 500 MG PO CAPS: 500.0000 mg | ORAL_CAPSULE | Freq: Two times a day (BID) | ORAL | 0 refills | Status: DC

## 2016-02-25 NOTE — Patient Instructions (Addendum)
   IF you received an x-ray today, you will receive an invoice from [facility]. Please contact [facility] at [phone] with questions or concerns regarding your invoice.   IF you received labwork today, you will receive an invoice from [facility]. Please contact [facility] at [phone] with questions or concerns regarding your invoice.   Our billing staff will not be able to assist you with questions regarding bills from these companies.  You will be contacted with the lab results as soon as they are available. The fastest way to get your results is to activate your My Chart account. Instructions are located on the last page of this paperwork. If you have not heard from us regarding the results in 2 weeks, please contact this office.     Cellulitis, Adult Cellulitis is a skin infection. The infected area is usually red and tender. This condition occurs most often in the arms and lower legs. The infection can travel to the muscles, blood, and underlying tissue and become serious. It is very important to get treated for this condition. What are the causes? Cellulitis is caused by bacteria. The bacteria enter through a break in the skin, such as a cut, burn, insect bite, open sore, or crack. What increases the risk? This condition is more likely to occur in people who:  Have a weak defense system (immune system).  Have open wounds on the skin such as cuts, burns, bites, and scrapes. Bacteria can enter the body through these open wounds.  Are older.  Have diabetes.  Have a type of long-lasting (chronic) liver disease (cirrhosis) or kidney disease.  Use IV drugs. What are the signs or symptoms? Symptoms of this condition include:  Redness, streaking, or spotting on the skin.  Swollen area of the skin.  Tenderness or pain when an area of the skin is touched.  Warm skin.  Fever.  Chills.  Blisters. How is this diagnosed? This condition is diagnosed  based on a medical history and physical exam. You may also have tests, including:  Blood tests.  Lab tests.  Imaging tests. How is this treated? Treatment for this condition may include:  Medicines, such as antibiotic medicines or antihistamines.  Supportive care, such as rest and application of cold or warm cloths (cold or warm compresses) to the skin.  Hospital care, if the condition is severe. The infection usually gets better within 1-2 days of treatment. Follow these instructions at home:  Take over-the-counter and prescription medicines only as told by your health care provider.  If you were prescribed an antibiotic medicine, take it as told by your health care provider. Do not stop taking the antibiotic even if you start to feel better.  Drink enough fluid to keep your urine clear or pale yellow.  Do not touch or rub the infected area.  Raise (elevate) the infected area above the level of your heart while you are sitting or lying down.  Apply warm or cold compresses to the affected area as told by your health care provider.  Keep all follow-up visits as told by your health care provider. This is important. These visits let your health care provider make sure a more serious infection is not developing. Contact a health care provider if:  You have a fever.  Your symptoms do not improve within 1-2 days of starting treatment.  Your bone or joint underneath the infected area becomes painful after the skin has healed.  Your infection returns in the same area   or another area.  You notice a swollen bump in the infected area.  You develop new symptoms.  You have a general ill feeling (malaise) with muscle aches and pains. Get help right away if:  Your symptoms get worse.  You feel very sleepy.  You develop vomiting or diarrhea that persists.  You notice red streaks coming from the infected area.  Your red area gets larger or turns dark in color. This information  is not intended to replace advice given to you by your health care provider. Make sure you discuss any questions you have with your health care provider. Document Released: 11/24/2004 Document Revised: 06/25/2015 Document Reviewed: 12/24/2014 Elsevier Interactive Patient Education  2017 Elsevier Inc.  

## 2016-02-25 NOTE — Progress Notes (Signed)
Chief Complaint  Patient presents with  . Abrasion    Right leg has a sore X 1 week    HPI Pt reports that somewhere between 4-7 days ago something hit his leg and the right leg on the lateral aspect started oozing.  He takes Pradaxa. He reports that the wound was still open and then his dog licked on it.  Now his area is tender with erythema.    Past Medical History:  Diagnosis Date  . Allergy   . Atrial fibrillation ([place])   . Barrett esophagus   . Barrett's esophagus   . BPH (benign prostatic hyperplasia)   . Chronic obstructive asthma   . Difficult intubation 13 or 14 years ago, at [facility] long   airway was up near nostrils with intubation per patient with bowel surgery  . ED (erectile dysfunction)   . GERD (gastroesophageal reflux disease)   . Hammer toe of right foot   . Hiatal hernia   . OSA (obstructive sleep apnea) 08/05/10 [facility]   AHI 24  . Paroxysmal atrial fibrillation ([place])    Hospitalized on 05/27/10  . Renal cell carcinoma 2009   right renal mass, followed by Dr. Ander Slade    Current Outpatient Prescriptions  Medication Sig Dispense Refill  . beclomethasone (QVAR) 80 MCG/ACT inhaler Inhale 1 puff into the lungs 2 (two) times daily. (Patient taking differently: Inhale 1 puff into the lungs daily. ) 8.7 g 5  . cetirizine (ZYRTEC) 10 MG tablet Take 10 mg by mouth as needed for allergies.     Marland Kitchen dabigatran (PRADAXA) 150 MG CAPS capsule Take 1 capsule (150 mg total) by mouth 2 (two) times daily. 60 capsule 6  . diltiazem (CARTIA XT) 180 MG 24 hr capsule Take 1 capsule (180 mg total) by mouth daily. 30 capsule 11  . finasteride (PROSCAR) 5 MG tablet Take 1 tablet by mouth daily.    . fluconazole (DIFLUCAN) 100 MG tablet Take 1 tablet (100 mg total) by mouth daily. 7 tablet 0  . fluticasone (FLONASE) 50 MCG/ACT nasal spray INSTILL 2 SPRAYS IN EACH NOSTRIL EVERY DAY AS DIRECTED 16 g 5  . metoprolol succinate (TOPROL-XL) 50 MG 24 hr tablet TAKE 1 TABLET BY MOUTH EVERY DAY  WITH OR FOLLOWING A MEAL 30 tablet 11  . omeprazole (PRILOSEC) 20 MG capsule TAKE ONE CAPSULE BY MOUTH EVERY DAY 30 capsule 11  . zolpidem (AMBIEN) 5 MG tablet Take 0.5-1 tablets (2.5-5 mg total) by mouth at bedtime as needed. For anxiety/insomnia 30 tablet 0  . albuterol (PROVENTIL HFA;VENTOLIN HFA) 108 (90 BASE) MCG/ACT inhaler Inhale 2 puffs into the lungs every 4 (four) hours as needed for wheezing or shortness of breath (cough, shortness of breath or wheezing.). (Patient not taking: Reported on 02/25/2016) 1 Inhaler 1  . AMBULATORY NON FORMULARY MEDICATION CPAP at night    . AMBULATORY NON FORMULARY MEDICATION UDO's Oil 3-6-9 Blend Take 1 tbsp every day    . AMBULATORY NON FORMULARY MEDICATION Cardio Protegen Liquid 2 tbsp every morning    . AMBULATORY NON FORMULARY MEDICATION Inner Eco Liquid Take 1 tbsp every morning    . AMBULATORY NON FORMULARY MEDICATION Herbal Aloe Take 2 tbsp every morning    . AMBULATORY NON FORMULARY MEDICATION Carnivora  Take 1 tablet twice a day    . Apoaequorin (PREVAGEN PO) Take 1 tablet by mouth daily.     . cephALEXin (KEFLEX) 500 MG capsule Take 1 capsule (500 mg total) by mouth 2 (two) times  daily. 14 capsule 0  . dutasteride (AVODART) 0.5 MG capsule TK 1 C PO QD  11   No current facility-administered medications for this visit.     Allergies:  Allergies  Allergen Reactions  . Proair Hfa [Albuterol] Palpitations    Only use in emergency due to has heart problems    Past Surgical History:  Procedure Laterality Date  . BASAL CELL CARCINOMA EXCISION    . ESOPHAGOGASTRODUODENOSCOPY (EGD) WITH PROPOFOL N/A 08/26/2014   Procedure: ESOPHAGOGASTRODUODENOSCOPY (EGD) WITH PROPOFOL;  Surgeon: [name], MD;  Location: [facility];  Service: Endoscopy;  Laterality: N/A;  . EXPLORATORY LAPAROTOMY W/ BOWEL RESECTION  12/1999   segmental sbr of a hemangioma, repair of incarcerated umbilical hernia  . exploratory laparotomy, segmental small bowel  resection of a hemangioma, repair of incarcerated umbelical hernia  [id number]  . EYE SURGERY     cataracts done  . renal cancer surgery with RF Ablation    . TONSILLECTOMY      Social History   Social History  . Marital status: Single    Spouse name: N/A  . Number of children: N/A  . Years of education: N/A   Occupational History  . tv producer/construction   . televison Secretary/administrator    Social History Main Topics  . Smoking status: Former Smoker    Years: 10.00    Types: Cigars    Quit date: 02/29/1980  . Smokeless tobacco: Never Used     Comment: quit when he was 75 years old  . Alcohol use No  . Drug use: No  . Sexual activity: Not Currently   Other Topics Concern  . None   Social History Narrative   ** Merged History Encounter ** Married. Education: [employer]. Exercise: Eleptical times a week for 45 minutes.           ROS No fevers or chills No numbness or tingling in his feet  Objective: Vitals:   02/25/16 1619  BP: 136/82  Pulse: 86  Temp: 97.5 F (36.4 C)  TempSrc: Oral  SpO2: 99%  Weight: 191 lb (86.6 kg)  Height: 5\' 10"  (1.778 m)    Physical Exam  Constitutional: He appears well-developed and well-nourished.  HENT:  Head: Normocephalic and atraumatic.  Eyes: Conjunctivae and EOM are normal.  Musculoskeletal:       Right lower leg: He exhibits tenderness, swelling and laceration. He exhibits no bony tenderness and no deformity.       Legs:    Assessment and Plan Tierney was seen today for abrasion.  Diagnoses and all orders for this visit:  Cellulitis of right lower extremity  Will treat for cellulitis with Keflex Gave home care Either pt has an underlying hematoma (pt on pradaxa) or fluctuance due to exudate (dog licking wound) Will treat empirically -     cephALEXin (KEFLEX) 500 MG capsule; Take 1 capsule (500 mg total) by mouth 2 (two) times daily.     [facility]

## 2016-03-07 ENCOUNTER — Telehealth: Payer: Self-pay | Admitting: Cardiology

## 2016-03-07 NOTE — Telephone Encounter (Signed)
Pt called and said Dr Sandi Mariscal wants him to take [facility]. Pt said Dr Martinique had told him not to take this medicine. Please have Dr Martinique tell him what he needs to do. Pt said Dr Martinique might need to give Dr Sandi Mariscal a call.He says he needs to know something today please.

## 2016-03-08 NOTE — Telephone Encounter (Signed)
Returned call to patient left message on personal voice mail Dr.Jordan advised ok to use Ventolin inhaler.

## 2016-04-14 ENCOUNTER — Ambulatory Visit: Payer: Medicare PPO | Admitting: Podiatry

## 2016-04-21 ENCOUNTER — Encounter: Payer: Self-pay | Admitting: Podiatry

## 2016-04-21 ENCOUNTER — Ambulatory Visit (INDEPENDENT_AMBULATORY_CARE_PROVIDER_SITE_OTHER): Payer: Medicare PPO | Admitting: Podiatry

## 2016-04-21 VITALS — Ht 70.0 in | Wt 191.0 lb

## 2016-04-21 DIAGNOSIS — B351 Tinea unguium: Secondary | ICD-10-CM

## 2016-04-21 DIAGNOSIS — M79676 Pain in unspecified toe(s): Secondary | ICD-10-CM | POA: Diagnosis not present

## 2016-04-21 NOTE — Progress Notes (Signed)
Patient ID: [name] F Tanksley Jr., male   DOB: 10/13/1940, 75 y.o.   MRN: [id number] Complaint:  Visit Type: Patient returns to my office for continued preventative foot care services. Complaint: Patient states" my nails have grown long and thick and become painful to walk and wear shoes". The patient presents for preventative foot care services. No changes to ROS.  Callus right foot is no longer painful  Podiatric Exam: Vascular: dorsalis pedis and posterior tibial pulses are palpable bilateral. Capillary return is immediate. Temperature gradient is WNL. Skin turgor WNL  Sensorium: Normal Semmes Weinstein monofilament test. Normal tactile sensation bilaterally. Nail Exam: Pt has thick disfigured discolored nails with subungual debris noted bilateral entire nail hallux through fifth toenails Ulcer Exam: There is no evidence of ulcer or pre-ulcerative changes or infection. Orthopedic Exam: Muscle tone and strength are WNL. No limitations in general ROM. No crepitus or effusions noted. Foot type and digits show no abnormalities. Bony prominences are unremarkable. Skin:  Asymptomatic  Porokeratosis sub 3 right . No infection or ulcers  Diagnosis:  Onychomycosis, , Pain in right toe, pain in left toes Porokeratosis   Treatment & Plan Procedures and Treatment: Consent by patient was obtained for treatment procedures. The patient understood the discussion of treatment and procedures well. All questions were answered thoroughly reviewed. Debridement of mycotic and hypertrophic toenails, 1 through 5 bilateral and clearing of subungual debris. No ulceration, no infection noted.   Return Visit-Office Procedure: Patient instructed to return to the office for a follow up visit 10 weeks  for continued evaluation and treatment.    Windy Dudek DPM 

## 2016-06-29 ENCOUNTER — Encounter: Payer: Self-pay | Admitting: Podiatry

## 2016-06-29 ENCOUNTER — Ambulatory Visit (INDEPENDENT_AMBULATORY_CARE_PROVIDER_SITE_OTHER): Payer: Medicare PPO | Admitting: Podiatry

## 2016-06-29 DIAGNOSIS — B351 Tinea unguium: Secondary | ICD-10-CM | POA: Diagnosis not present

## 2016-06-29 DIAGNOSIS — M79676 Pain in unspecified toe(s): Secondary | ICD-10-CM | POA: Diagnosis not present

## 2016-06-29 NOTE — Progress Notes (Signed)
Patient ID: [name]., male   DOB: 12/20/1940, 76 y.o.   MRN: [id number] Complaint:  Visit Type: Patient returns to my office for continued preventative foot care services. Complaint: Patient states" my nails have grown long and thick and become painful to walk and wear shoes". The patient presents for preventative foot care services. No changes to ROS.  Callus right foot is no longer painful  Podiatric Exam: Vascular: dorsalis pedis and posterior tibial pulses are palpable bilateral. Capillary return is immediate. Temperature gradient is WNL. Skin turgor WNL  Sensorium: Normal Semmes Weinstein monofilament test. Normal tactile sensation bilaterally. Nail Exam: Pt has thick disfigured discolored nails with subungual debris noted bilateral entire nail hallux through fifth toenails Ulcer Exam: There is no evidence of ulcer or pre-ulcerative changes or infection. Orthopedic Exam: Muscle tone and strength are WNL. No limitations in general ROM. No crepitus or effusions noted. Foot type and digits show no abnormalities. Bony prominences are unremarkable. Skin:  Asymptomatic  Porokeratosis sub 3 right . No infection or ulcers  Diagnosis:  Onychomycosis, , Pain in right toe, pain in left toes Porokeratosis   Treatment & Plan Procedures and Treatment: Consent by patient was obtained for treatment procedures. The patient understood the discussion of treatment and procedures well. All questions were answered thoroughly reviewed. Debridement of mycotic and hypertrophic toenails, 1 through 5 bilateral and clearing of subungual debris. No ulceration, no infection noted.   Return Visit-Office Procedure: Patient instructed to return to the office for a follow up visit 10 weeks  for continued evaluation and treatment.    Gardiner Barefoot DPM

## 2016-07-05 ENCOUNTER — Ambulatory Visit (INDEPENDENT_AMBULATORY_CARE_PROVIDER_SITE_OTHER): Payer: Medicare PPO | Admitting: Pulmonary Disease

## 2016-07-05 ENCOUNTER — Encounter: Payer: Self-pay | Admitting: Pulmonary Disease

## 2016-07-05 VITALS — BP 122/82 | HR 58 | Ht 70.0 in | Wt 190.4 lb

## 2016-07-05 DIAGNOSIS — G4733 Obstructive sleep apnea (adult) (pediatric): Secondary | ICD-10-CM

## 2016-07-05 DIAGNOSIS — J4520 Mild intermittent asthma, uncomplicated: Secondary | ICD-10-CM

## 2016-07-05 NOTE — Patient Instructions (Signed)
Follow up in 1 year.

## 2016-07-05 NOTE — Progress Notes (Signed)
Current Outpatient Prescriptions on File Prior to Visit  Medication Sig  . albuterol (PROVENTIL HFA;VENTOLIN HFA) 108 (90 BASE) MCG/ACT inhaler Inhale 2 puffs into the lungs every 4 (four) hours as needed for wheezing or shortness of breath (cough, shortness of breath or wheezing.).  Marland Kitchen AMBULATORY NON FORMULARY MEDICATION CPAP at night  . AMBULATORY NON FORMULARY MEDICATION UDO's Oil 3-6-9 Blend Take 1 tbsp every day  . AMBULATORY NON FORMULARY MEDICATION Cardio Protegen Liquid 2 tbsp every morning  . AMBULATORY NON FORMULARY MEDICATION Inner Eco Liquid Take 1 tbsp every morning  . AMBULATORY NON FORMULARY MEDICATION Herbal Aloe Take 2 tbsp every morning  . AMBULATORY NON FORMULARY MEDICATION Carnivora  Take 1 tablet twice a day  . Apoaequorin (PREVAGEN PO) Take 1 tablet by mouth daily.   . dabigatran (PRADAXA) 150 MG CAPS capsule Take 1 capsule (150 mg total) by mouth 2 (two) times daily.  Marland Kitchen diltiazem (CARTIA XT) 180 MG 24 hr capsule Take 1 capsule (180 mg total) by mouth daily.  Marland Kitchen dutasteride (AVODART) 0.5 MG capsule TK 1 C PO QD  . finasteride (PROSCAR) 5 MG tablet Take 1 tablet by mouth daily.  . fluconazole (DIFLUCAN) 100 MG tablet Take 1 tablet (100 mg total) by mouth daily.  . fluticasone (FLONASE) 50 MCG/ACT nasal spray INSTILL 2 SPRAYS IN EACH NOSTRIL EVERY DAY AS DIRECTED  . metoprolol succinate (TOPROL-XL) 50 MG 24 hr tablet TAKE 1 TABLET BY MOUTH EVERY DAY WITH OR FOLLOWING A MEAL  . omeprazole (PRILOSEC) 20 MG capsule TAKE ONE CAPSULE BY MOUTH EVERY DAY  . zolpidem (AMBIEN) 5 MG tablet Take 0.5-1 tablets (2.5-5 mg total) by mouth at bedtime as needed. For anxiety/insomnia  . cetirizine (ZYRTEC) 10 MG tablet Take 10 mg by mouth as needed for allergies.    No current facility-administered medications on file prior to visit.     Chief Complaint  Patient presents with  . Follow-up    Denies any problems with asthma. Pt notes rare events of wheezing while off of QVAR. // Wears  CPAP nightly. Denies problems with mask/pressure. Pt notes some congestion which makes it difficult to use the CPAP machine. Pt uses Flonase when he hsa congestion. DME: [facility]     Sleep tests PSG 08/05/10>>AHI 24.2, SpO2 low 77%, PLMI 15.8. PSG with oral appliance 02/07/12>>AHI 11.5, SpO2 low 78%. Both obstructive and central events. Auto CPAP 01/14/14 to 02/12/14 >> used on 17 of 30 nights with average 4 hrs and 55 min.  Average AHI is 11 with median CPAP 7 cm H2O and 95 th percentile CPAP 9 cm H20.  Mostly central events.  Pulmonary tests PFT 07/26/11>>FEV1 2.62(91%), FEV1% 66, TLC 6.55(101%), DLCO 94%, +BD  Past medical history Atrial fibrillation, Barrett esophagus, GERD, BPH, hiatal hernia, Renal cell carcinoma 2009  Past surgical history, Family history, Social history, Allergies reviewed  Vital signs BP 122/82 (BP Location: Left Arm, Cuff Size: Normal)   Pulse (!) 58   Ht 5\' 10"  (1.778 m)   Wt 190 lb 6.4 oz (86.4 kg)   SpO2 98%   BMI 27.32 kg/m   History of Present Illness: [name]. is a 76 y.o. male with COPD with asthma, rhinitis and OSA.  No issues with breathing.  Occasional sinus congestion.  Uses flonase prn.  Uses Qvar prn if he gets chest congestion.  No issues with CPAP set up.    Physical Exam:   General - pleasant Eyes - pupils reactive ENT - no  sinus tenderness, no oral exudate, no LAN Cardiac - regular, no murmur Chest - no wheeze, rales Abd - soft, non tender Ext - no edema Skin - no rashes Neuro - normal strength Psych - normal mood   Assessment/Plan:  Mild intermittent asthma. - continue prn Qvar, albuterol  Obstructive sleep apnea. - he is compliant with CPAP and reports benefit - continue auto CPAP  Upper airway cough syndrome with PND. - continue prn flonase  Patient Instructions  Follow up in 1 year    [name], MD [facility] Pager:  [phone] 07/05/2016, 10:29 AM

## 2016-09-07 ENCOUNTER — Ambulatory Visit: Payer: Medicare PPO | Admitting: Podiatry

## 2016-09-23 ENCOUNTER — Ambulatory Visit
Admission: RE | Admit: 2016-09-23 | Discharge: 2016-09-23 | Disposition: A | Discharge status: Home / Self Care | Payer: Medicare PPO | Source: Ambulatory Visit | Attending: Family Medicine | Admitting: Family Medicine

## 2016-09-23 ENCOUNTER — Other Ambulatory Visit: Payer: Self-pay | Admitting: Family Medicine

## 2016-09-23 ENCOUNTER — Other Ambulatory Visit: Payer: Self-pay | Admitting: Gastroenterology

## 2016-09-23 DIAGNOSIS — M79672 Pain in left foot: Secondary | ICD-10-CM

## 2016-10-26 ENCOUNTER — Other Ambulatory Visit: Payer: Self-pay | Admitting: Gastroenterology

## 2016-10-26 ENCOUNTER — Other Ambulatory Visit: Payer: Self-pay | Admitting: *Deleted

## 2016-10-26 MED ORDER — DILTIAZEM HCL ER COATED BEADS 180 MG PO CP24: 180.0000 mg | ORAL_CAPSULE | Freq: Every day | ORAL | 11 refills | Status: DC

## 2016-10-26 MED ORDER — METOPROLOL SUCCINATE ER 50 MG PO TB24: ORAL_TABLET | ORAL | 5 refills | Status: DC

## 2016-11-02 ENCOUNTER — Other Ambulatory Visit: Payer: Self-pay | Admitting: Dermatology

## 2016-11-28 ENCOUNTER — Other Ambulatory Visit: Payer: Self-pay | Admitting: Gastroenterology

## 2016-12-02 ENCOUNTER — Telehealth: Payer: Self-pay | Admitting: Pulmonary Disease

## 2016-12-02 DIAGNOSIS — G4733 Obstructive sleep apnea (adult) (pediatric): Secondary | ICD-10-CM

## 2016-12-02 DIAGNOSIS — Z9989 Dependence on other enabling machines and devices: Principal | ICD-10-CM

## 2016-12-02 NOTE — Telephone Encounter (Signed)
Done order faxed and confirmation received

## 2016-12-02 NOTE — Telephone Encounter (Signed)
Spoke with Kenney Houseman, she states pt's CPAP machine stopped working. It wont even come on when plugged in. New order for CPAP sent to [facility]. Patient at [name] now. FYI VS, FYI PCC's   [employer] [phone]

## 2016-12-02 NOTE — Telephone Encounter (Signed)
Noted  

## 2016-12-28 ENCOUNTER — Other Ambulatory Visit: Payer: Self-pay | Admitting: Gastroenterology

## 2017-01-05 NOTE — Progress Notes (Signed)
[name]. Date of Birth: 26-Jun-1940 Medical Record #[id number]  History of Present Illness: [name] is seen for follow up Afib.He has a history of atrial fibrillation dating back to 2012. Initially this was paroxysmal but multiple Ecgs since then show persistent Afib with controlled rate. He is managed with metoprolol, diltiazem, and pradaxa.  He works out vigorously every other day for one hour. He denies any chest pain, SOB, or palpitations. No dizziness or lightheadedness.  He still uses CPAP.   Allergies as of 01/09/2017      Reactions   Tamsulosin Other (See Comments)   dizziness   Proair Hfa [albuterol] Palpitations   Only use in emergency due to has heart problems      Medication List        Accurate as of 01/09/17  9:38 AM. Always use your most recent med list.          albuterol 108 (90 Base) MCG/ACT inhaler Commonly known as:  PROVENTIL HFA;VENTOLIN HFA Inhale 2 puffs into the lungs every 4 (four) hours as needed for wheezing or shortness of breath (cough, shortness of breath or wheezing.).   AMBULATORY NON FORMULARY MEDICATION CPAP at night   AMBULATORY NON FORMULARY MEDICATION UDO's Oil 3-6-9 Blend Take 1 tbsp every day   AMBULATORY NON FORMULARY MEDICATION Cardio Protegen Liquid 2 tbsp every morning   AMBULATORY NON FORMULARY MEDICATION Inner Eco Liquid Take 1 tbsp every morning   AMBULATORY NON FORMULARY MEDICATION Herbal Aloe Take 2 tbsp every morning   AMBULATORY NON FORMULARY MEDICATION Carnivora  Take 1 tablet twice a day   cetirizine 10 MG tablet Commonly known as:  ZYRTEC Take 10 mg by mouth as needed for allergies.   dabigatran 150 MG Caps capsule Commonly known as:  PRADAXA Take 1 capsule (150 mg total) by mouth 2 (two) times daily.   diltiazem 180 MG 24 hr capsule Commonly known as:  CARTIA XT Take 1 capsule (180 mg total) by mouth daily.   dutasteride 0.5 MG capsule Commonly known as:  AVODART TK 1 C PO QD     finasteride 5 MG tablet Commonly known as:  PROSCAR Take 1 tablet by mouth daily.   fluconazole 100 MG tablet Commonly known as:  DIFLUCAN Take 1 tablet (100 mg total) by mouth daily.   fluticasone 50 MCG/ACT nasal spray Commonly known as:  FLONASE INSTILL 2 SPRAYS IN EACH NOSTRIL EVERY DAY AS DIRECTED   metoprolol succinate 50 MG 24 hr tablet Commonly known as:  TOPROL-XL TAKE 1 TABLET BY MOUTH EVERY DAY WITH OR FOLLOWING A MEAL   omeprazole 20 MG capsule Commonly known as:  PRILOSEC TAKE ONE CAPSULE BY MOUTH EVERY DAY   pravastatin 20 MG tablet Commonly known as:  PRAVACHOL Take 20 mg daily by mouth.   PREVAGEN PO Take 1 tablet by mouth daily.   zolpidem 5 MG tablet Commonly known as:  AMBIEN Take 0.5-1 tablets (2.5-5 mg total) by mouth at bedtime as needed. For anxiety/insomnia        Allergies  Allergen Reactions  . Tamsulosin Other (See Comments)    dizziness  . Proair Hfa [Albuterol] Palpitations    Only use in emergency due to has heart problems    Past Medical History:  Diagnosis Date  . Allergy   . Atrial fibrillation ([place])   . Barrett esophagus   . Barrett's esophagus   . BPH (benign prostatic hyperplasia)   . Chronic obstructive asthma   . Difficult  intubation 13 or 14 years ago, at [facility] long   airway was up near nostrils with intubation per patient with bowel surgery  . ED (erectile dysfunction)   . GERD (gastroesophageal reflux disease)   . Hammer toe of right foot   . Hiatal hernia   . OSA (obstructive sleep apnea) 08/05/10 PS[facility]   AHI 24  . Paroxysmal atrial fibrillation ([place])    Hospitalized on 05/27/10  . Renal cell carcinoma 2009   right renal mass, followed by Dr. Ander Slade    Past Surgical History:  Procedure Laterality Date  . BASAL CELL CARCINOMA EXCISION    . EXPLORATORY LAPAROTOMY W/ BOWEL RESECTION  12/1999   segmental sbr of a hemangioma, repair of incarcerated umbilical hernia  . exploratory laparotomy, segmental small  bowel resection of a hemangioma, repair of incarcerated umbelical hernia  38/2505  . EYE SURGERY     cataracts done  . renal cancer surgery with RF Ablation    . TONSILLECTOMY      Social History   Socioeconomic History  . Marital status: Single    Spouse name: None  . Number of children: None  . Years of education: None  . Highest education level: None  Social Needs  . Financial resource strain: None  . Food insecurity - worry: None  . Food insecurity - inability: None  . Transportation needs - medical: None  . Transportation needs - non-medical: None  Occupational History  . Occupation: Engineer, manufacturing  . Occupation: Warehouse manager  Tobacco Use  . Smoking status: Former Smoker    Years: 10.00    Types: Cigars    Last attempt to quit: 02/29/1980    Years since quitting: 36.8  . Smokeless tobacco: Never Used  . Tobacco comment: quit when he was 76 years old  Substance and Sexual Activity  . Alcohol use: No  . Drug use: No  . Sexual activity: Not Currently  Other Topics Concern  . None  Social History Narrative   ** Merged History Encounter ** Married. Education: [employer]. Exercise: Eleptical times a week for 45 minutes.           Family History  Problem Relation Age of Onset  . Stroke Mother   . Diabetes Mother   . Obesity Brother   . Diabetes Brother   . Diabetes Father   . Asthma Paternal Grandmother   . Heart attack Paternal Grandfather   . Kidney cancer Paternal Aunt   . Colon cancer Neg Hx     Review of Systems: As noted in HPI.  All other systems were reviewed and are negative.  Physical Exam: BP 100/64   Pulse 63   Ht 5\' 10"  (1.778 m)   Wt 188 lb (85.3 kg)   BMI 26.98 kg/m  Filed Weights   01/09/17 0916  Weight: 188 lb (85.3 kg)   GENERAL:  Well appearing WM in NAD HEENT:  PERRL, EOMI, sclera are clear. Oropharynx is clear. NECK:  No jugular venous distention, carotid upstroke brisk and symmetric, no bruits, no thyromegaly or  adenopathy LUNGS:  Clear to auscultation bilaterally CHEST:  Unremarkable HEART:  IRRR,  PMI not displaced or sustained,S1 and S2 within normal limits, no S3, no S4: no clicks, no rubs, no murmurs ABD:  Soft, nontender. BS +, no masses or bruits. No hepatomegaly, no splenomegaly EXT:  2 + pulses throughout, no edema, no cyanosis no clubbing SKIN:  Warm and dry.  No rashes NEURO:  Alert and oriented x  3. Cranial nerves II through XII intact. PSYCH:  Cognitively intact   LABORATORY DATA:  Lab Results  Component Value Date   WBC 4.9 09/16/2013   HGB 14.3 09/16/2013   HCT 41.6 09/16/2013   PLT 212 09/16/2013   GLUCOSE 97 11/19/2014   CHOL 179 11/19/2014   TRIG 67 11/19/2014   HDL 54 11/19/2014   LDLCALC 112 11/19/2014   ALT 13 11/19/2014   AST 18 11/19/2014   NA 138 11/19/2014   K 4.6 11/19/2014   CL 101 11/19/2014   CREATININE 1.25 (H) 11/19/2014   BUN 17 11/19/2014   CO2 28 11/19/2014   TSH 2.005 11/19/2012   PSA 2.79 11/19/2012   INR 1.17 05/18/2011   Dated 06/02/16: creatinine 1.3.    Ecg today shows Afib with rate 63. Otherwise normal. I have personally reviewed and interpreted this study.  Assessment / Plan: 1. Atrial fibrillation. Permanent.  Rate well controlled on metoprolol and diltiazem. Continue rate control medications and anticoagulation with Pradaxa. Renal function is stable.I will follow up in one year.  2. CKD stage 2. Stable.  3. Barrett's esophagus.

## 2017-01-09 ENCOUNTER — Encounter: Payer: Self-pay | Admitting: Cardiology

## 2017-01-09 ENCOUNTER — Ambulatory Visit: Payer: Medicare PPO | Admitting: Cardiology

## 2017-01-09 VITALS — BP 100/64 | HR 63 | Ht 70.0 in | Wt 188.0 lb

## 2017-01-09 DIAGNOSIS — I482 Chronic atrial fibrillation, unspecified: Secondary | ICD-10-CM

## 2017-01-09 NOTE — Patient Instructions (Signed)
Continue your current therapy  I will see you in one year   

## 2017-01-30 ENCOUNTER — Other Ambulatory Visit: Payer: Self-pay

## 2017-01-30 MED ORDER — DABIGATRAN ETEXILATE MESYLATE 150 MG PO CAPS: 150.0000 mg | ORAL_CAPSULE | Freq: Two times a day (BID) | ORAL | 6 refills | Status: DC

## 2017-02-08 ENCOUNTER — Encounter: Payer: Self-pay | Admitting: Podiatry

## 2017-02-08 ENCOUNTER — Ambulatory Visit: Payer: Medicare PPO | Admitting: Podiatry

## 2017-02-08 DIAGNOSIS — M79676 Pain in unspecified toe(s): Secondary | ICD-10-CM

## 2017-02-08 DIAGNOSIS — B351 Tinea unguium: Secondary | ICD-10-CM

## 2017-02-08 NOTE — Progress Notes (Signed)
Patient ID: [name]., male   DOB: 03-Mar-1940, 76 y.o.   MRN: [id number] Complaint:  Visit Type: Patient returns to my office for continued preventative foot care services. Complaint: Patient states" my nails have grown long and thick and become painful to walk and wear shoes". The patient presents for preventative foot care services. No changes to ROS.  Callus right foot is no longer painful.  Patient has not been seen for 7 months.  Podiatric Exam: Vascular: dorsalis pedis and posterior tibial pulses are palpable bilateral. Capillary return is immediate. Temperature gradient is WNL. Skin turgor WNL  Sensorium: Normal Semmes Weinstein monofilament test. Normal tactile sensation bilaterally. Nail Exam: Pt has thick disfigured discolored nails with subungual debris noted bilateral entire nail hallux through fifth toenails Ulcer Exam: There is no evidence of ulcer or pre-ulcerative changes or infection. Orthopedic Exam: Muscle tone and strength are WNL. No limitations in general ROM. No crepitus or effusions noted. Foot type and digits show no abnormalities. Bony prominences are unremarkable. Skin:    Porokeratosis sub 3 right asymptomatic . No infection or ulcers  Diagnosis:  Onychomycosis, , Pain in right toe, pain in left toes   Treatment & Plan Procedures and Treatment: Consent by patient was obtained for treatment procedures. The patient understood the discussion of treatment and procedures well. All questions were answered thoroughly reviewed. Debridement of mycotic and hypertrophic toenails, 1 through 5 bilateral and clearing of subungual debris. No ulceration, no infection noted.   Return Visit-Office Procedure: Patient instructed to return to the office for a follow up visit 10 weeks  for continued evaluation and treatment.    Gardiner Barefoot DPM

## 2017-02-24 ENCOUNTER — Other Ambulatory Visit (HOSPITAL_COMMUNITY): Payer: Self-pay | Admitting: Family Medicine

## 2017-02-24 DIAGNOSIS — I6523 Occlusion and stenosis of bilateral carotid arteries: Secondary | ICD-10-CM

## 2017-03-01 ENCOUNTER — Ambulatory Visit (HOSPITAL_COMMUNITY)
Admission: RE | Admit: 2017-03-01 | Discharge: 2017-03-01 | Disposition: A | Discharge status: Home / Self Care | Payer: Medicare PPO | Source: Ambulatory Visit | Attending: Vascular Surgery | Admitting: Vascular Surgery

## 2017-03-01 DIAGNOSIS — I6523 Occlusion and stenosis of bilateral carotid arteries: Secondary | ICD-10-CM | POA: Diagnosis present

## 2017-03-01 LAB — VAS US CAROTID
LCCADSYS: -90 cm/s
LCCAPDIAS: 28 cm/s
LEFT ECA DIAS: -10 cm/s
LEFT VERTEBRAL DIAS: 10 cm/s
LICADSYS: -80 cm/s
LICAPDIAS: -24 cm/s
LICAPSYS: -87 cm/s
Left CCA dist dias: -16 cm/s
Left CCA prox sys: 151 cm/s
Left ICA dist dias: -37 cm/s
RIGHT CCA MID DIAS: -12 cm/s
RIGHT ECA DIAS: -9 cm/s
RIGHT VERTEBRAL DIAS: -8 cm/s
Right CCA prox dias: 15 cm/s
Right CCA prox sys: 124 cm/s
Right cca dist sys: -81 cm/s

## 2017-03-07 ENCOUNTER — Encounter: Payer: Self-pay | Admitting: Gastroenterology

## 2017-03-07 ENCOUNTER — Ambulatory Visit: Payer: Medicare PPO | Admitting: Gastroenterology

## 2017-03-07 VITALS — BP 110/70 | HR 66 | Ht 70.0 in | Wt 190.2 lb

## 2017-03-07 DIAGNOSIS — K2270 Barrett's esophagus without dysplasia: Secondary | ICD-10-CM | POA: Diagnosis not present

## 2017-03-07 NOTE — Progress Notes (Signed)
    History of Present Illness: This is a 77 year old male returning for follow-up of Barrett's esophagus.  He has excellent control of his reflux symptoms taking daily omeprazole.  He has no gastrointestinal complaints.  He is due for surveillance endoscopy this summer.  He is maintained on Pradaxa for afib  Current Medications, Allergies, Past Medical History, Past Surgical History, Family History and Social History were reviewed in [employer] record.  Physical Exam: General: Well developed, well nourished, no acute distress Head: Normocephalic and atraumatic Eyes:  sclerae anicteric, EOMI Ears: Normal auditory acuity Mouth: No deformity or lesions Lungs: Clear throughout to auscultation Heart: Regular rate and rhythm; no murmurs, rubs or bruits Abdomen: Soft, non tender and non distended. No masses, hepatosplenomegaly or hernias noted. Normal Bowel sounds Rectal: not done Musculoskeletal: Symmetrical with no gross deformities  Pulses:  Normal pulses noted Extremities: No clubbing, cyanosis, edema or deformities noted Neurological: Alert oriented x 4, some last night he did memory deficits Psychological:  Alert and cooperative. Normal mood and affect  Assessment and Recommendations:  1.  Barrett's esophagus, short segment without dysplasia.  Continue omeprazole 20 mg daily and standard antireflux measures.  His surveillance endoscopy is due in June 2019.  2. Afib,  chronic anticoagulation with Pradaxa.   3 CRC screening, average risk. A 10-year screening colonoscopy is due in June 2021.

## 2017-03-07 NOTE — Patient Instructions (Signed)
You will be due for a recall Upper Endoscopy in 07/2017. We will send you a reminder in the mail when it gets closer to that time.  Normal BMI (Body Mass Index- based on height and weight) is between 23 and 30. Your BMI today is Body mass index is 27.3 kg/m. Marland Kitchen Please consider follow up  regarding your BMI with your Primary Care Provider.  Thank you for choosing me and [facility].  Pricilla Riffle. Dagoberto Ligas., MD., Marval Regal

## 2017-03-29 ENCOUNTER — Other Ambulatory Visit: Payer: Self-pay

## 2017-03-29 MED ORDER — DILTIAZEM HCL ER COATED BEADS 180 MG PO CP24: 180.0000 mg | ORAL_CAPSULE | Freq: Every day | ORAL | 11 refills | Status: DC

## 2017-04-11 ENCOUNTER — Other Ambulatory Visit (HOSPITAL_COMMUNITY): Payer: Self-pay | Admitting: Family Medicine

## 2017-04-11 ENCOUNTER — Ambulatory Visit (HOSPITAL_COMMUNITY)
Admission: RE | Admit: 2017-04-11 | Discharge: 2017-04-11 | Disposition: A | Discharge status: Home / Self Care | Payer: Medicare PPO | Source: Ambulatory Visit | Attending: Vascular Surgery | Admitting: Vascular Surgery

## 2017-04-11 DIAGNOSIS — Z09 Encounter for follow-up examination after completed treatment for conditions other than malignant neoplasm: Secondary | ICD-10-CM | POA: Diagnosis present

## 2017-04-11 DIAGNOSIS — I714 Abdominal aortic aneurysm, without rupture, unspecified: Secondary | ICD-10-CM

## 2017-05-01 ENCOUNTER — Other Ambulatory Visit: Payer: Self-pay | Admitting: *Deleted

## 2017-05-01 MED ORDER — METOPROLOL SUCCINATE ER 50 MG PO TB24: ORAL_TABLET | ORAL | 8 refills | Status: DC

## 2017-05-10 ENCOUNTER — Encounter: Payer: Self-pay | Admitting: Podiatry

## 2017-05-10 ENCOUNTER — Ambulatory Visit: Payer: Medicare PPO | Admitting: Podiatry

## 2017-05-10 DIAGNOSIS — M79676 Pain in unspecified toe(s): Secondary | ICD-10-CM

## 2017-05-10 DIAGNOSIS — B351 Tinea unguium: Secondary | ICD-10-CM

## 2017-05-10 NOTE — Progress Notes (Signed)
Patient ID: [name]., male   DOB: 1940/09/18, 77 y.o.   MRN: [id number] Complaint:  Visit Type: Patient returns to my office for continued preventative foot care services. Complaint: Patient states" my nails have grown long and thick and become painful to walk and wear shoes". The patient presents for preventative foot care services. No changes to ROS.  Callus right foot is no longer painful.  Patient has not been seen for 7 months.  Podiatric Exam: Vascular: dorsalis pedis and posterior tibial pulses are palpable bilateral. Capillary return is immediate. Temperature gradient is WNL. Skin turgor WNL  Sensorium: Normal Semmes Weinstein monofilament test. Normal tactile sensation bilaterally. Nail Exam: Pt has thick disfigured discolored nails with subungual debris noted bilateral entire nail hallux through fifth toenails Ulcer Exam: There is no evidence of ulcer or pre-ulcerative changes or infection. Orthopedic Exam: Muscle tone and strength are WNL. No limitations in general ROM. No crepitus or effusions noted. Foot type and digits show no abnormalities. Bony prominences are unremarkable. Skin:    Porokeratosis sub 3 right asymptomatic . No infection or ulcers  Diagnosis:  Onychomycosis, , Pain in right toe, pain in left toes   Treatment & Plan Procedures and Treatment: Consent by patient was obtained for treatment procedures. The patient understood the discussion of treatment and procedures well. All questions were answered thoroughly reviewed. Debridement of mycotic and hypertrophic toenails, 1 through 5 bilateral and clearing of subungual debris. No ulceration, no infection noted.   Return Visit-Office Procedure: Patient instructed to return to the office for a follow up visit 10 weeks  for continued evaluation and treatment.    Gardiner Barefoot DPM

## 2017-06-07 DIAGNOSIS — K862 Cyst of pancreas: Secondary | ICD-10-CM | POA: Insufficient documentation

## 2017-06-19 ENCOUNTER — Ambulatory Visit: Payer: Medicare PPO | Admitting: Pulmonary Disease

## 2017-06-19 ENCOUNTER — Telehealth: Payer: Self-pay | Admitting: Cardiology

## 2017-06-19 ENCOUNTER — Encounter: Payer: Self-pay | Admitting: Pulmonary Disease

## 2017-06-19 VITALS — BP 118/78 | HR 75 | Ht 70.0 in | Wt 192.0 lb

## 2017-06-19 DIAGNOSIS — R05 Cough: Secondary | ICD-10-CM

## 2017-06-19 DIAGNOSIS — I6523 Occlusion and stenosis of bilateral carotid arteries: Secondary | ICD-10-CM

## 2017-06-19 DIAGNOSIS — Z9989 Dependence on other enabling machines and devices: Secondary | ICD-10-CM | POA: Diagnosis not present

## 2017-06-19 DIAGNOSIS — G4733 Obstructive sleep apnea (adult) (pediatric): Secondary | ICD-10-CM | POA: Diagnosis not present

## 2017-06-19 DIAGNOSIS — J4520 Mild intermittent asthma, uncomplicated: Secondary | ICD-10-CM | POA: Diagnosis not present

## 2017-06-19 DIAGNOSIS — R058 Other specified cough: Secondary | ICD-10-CM

## 2017-06-19 MED ORDER — MONTELUKAST SODIUM 10 MG PO TABS: 10.0000 mg | ORAL_TABLET | Freq: Every day | ORAL | 5 refills | Status: DC

## 2017-06-19 MED ORDER — BECLOMETHASONE DIPROP HFA 40 MCG/ACT IN AERB
1.0000 | INHALATION_SPRAY | Freq: Two times a day (BID) | RESPIRATORY_TRACT | 5 refills | Status: DC
Start: 2017-06-19 — End: 2017-12-13

## 2017-06-19 NOTE — Telephone Encounter (Signed)
° °  [facility] Pre-operative Risk Assessment    Request for surgical clearance:  1. What type of surgery is being performed? Endoscopy U/S with needle   2. When is this surgery scheduled? 07-06-17   3. What type of clearance is required (medical clearance vs. Pharmacy clearance to hold med vs. Both)? Pharmacy  4. Are there any medications that need to be held prior to surgery and how long?Wants to stop his Pradaxa 3 days prior to surgery   5. Practice name and name of physician performing surgery? Dr Delrae Alfred   6. What is your office phone [phone]    7.   What is your office fax [phone]  8.   Anesthesia type (None, local, MAC, general) ? MAC   [name] 06/19/2017, 11:38 AM  _________________________________________________________________   (provider comments below)

## 2017-06-19 NOTE — Patient Instructions (Signed)
Use flonase and zyrtec daily until sinus congestion better Singulair 10 mg pill nightly Can restart Qvar if you get more symptoms in your chest   Follow up in 1 year

## 2017-06-19 NOTE — Progress Notes (Signed)
[facility]  Chief Complaint  Patient presents with  . Follow-up    OSA, ROV     Vital signs: BP 118/78 (BP Location: Right Arm, Cuff Size: Normal)   Pulse 75   Ht 5\' 10"  (1.778 m)   Wt 192 lb (87.1 kg)   SpO2 97%   BMI 27.55 kg/m   History of Present Illness: [name]. is a 77 y.o. male with COPD with asthma, rhinitis and OSA.  He has more cough and sinus congestion.  Getting some post nasal drip.  Has trouble using CPAP because of this.  Travels frequently and wants travel CPAP.  Got new CPAP last year.  Not having fever, chest pain, wheeze, skin rash, or dyspnea.   Physical Exam:  General - pleasant Eyes - pupils reactive ENT - no sinus tenderness, no oral exudate, no LAN Cardiac - regular, no murmur Chest - no wheeze, rales Abd - soft, non tender Ext - no edema Skin - no rashes Neuro - normal strength Psych - normal mood  Assessment/Plan:  Upper airway cough syndrome. - most of current symptoms likely from this - use flonase and zyrtec daily for now - add singulair daily for now  Mild intermittent asthma. - he can resume Qvar if he feels his symptoms settle into his chest  Obstructive sleep apnea. - he is compliant with CPAP and reports benefit from therapy - continue auto CPAP - he will call if he finds a travel CPAP he would like to purchase on his own   Patient Instructions  Use flonase and zyrtec daily until sinus congestion better Singulair 10 mg pill nightly Can restart Qvar if you get more symptoms in your chest   Follow up in 1 year    [name], MD [place] 06/19/2017, 12:46 PM  Flow Sheet  Sleep tests: PSG 08/05/10>>AHI 24.2, SpO2 low 77%, PLMI 15.8. PSG with oral appliance 02/07/12>>AHI 11.5, SpO2 low 78%. Both obstructive and central events. Auto CPAP 01/14/14 to 02/12/14 >> used on 17 of 30 nights with average 4 hrs and 55 min.  Average AHI is 11 with median  CPAP 7 cm H2O and 95 th percentile CPAP 9 cm H20.  Mostly central events.  Pulmonary tests PFT 07/26/11>>FEV1 2.62(91%), FEV1% 66, TLC 6.55(101%), DLCO 94%, +BD  Past Medical History: He  has a past medical history of Allergy, Atrial fibrillation ([place]), Barrett esophagus, Barrett's esophagus, BPH (benign prostatic hyperplasia), Chronic obstructive asthma, Difficult intubation (13 or 14 years ago, at [facility] long), ED (erectile dysfunction), GERD (gastroesophageal reflux disease), Hammer toe of right foot, Hiatal hernia, OSA (obstructive sleep apnea) (08/05/10 PSG), Paroxysmal atrial fibrillation ([place]), and Renal cell carcinoma (2009).  Past Surgical History: He  has a past surgical history that includes exploratory laparotomy, segmental small bowel resection of a hemangioma, repair of incarcerated umbelical hernia (78/2423); Exploratory laparotomy w/ bowel resection (12/1999); renal cancer surgery with RF Ablation; Tonsillectomy; Excision basal cell carcinoma; Eye surgery; and Esophagogastroduodenoscopy (egd) with propofol (N/A, 08/26/2014).  Family History: His family history includes Asthma in his paternal grandmother; Diabetes in his brother, father, and mother; Heart attack in his paternal grandfather; Kidney cancer in his paternal aunt; Obesity in his brother; Stroke in his mother.  Social History: He  reports that he quit smoking about 37 years ago. His smoking use included cigars. He quit after 10.00 years of use. He has never used smokeless tobacco. He reports that he does not drink  alcohol or use drugs.  Medications: Allergies as of 06/19/2017      Reactions   Tamsulosin Other (See Comments)   dizziness   Proair Hfa [albuterol] Palpitations   Only use in emergency due to has heart problems      Medication List        Accurate as of 06/19/17 12:46 PM. Always use your most recent med list.          albuterol 108 (90 Base) MCG/ACT inhaler Commonly known as:  PROVENTIL  HFA;VENTOLIN HFA Inhale 2 puffs into the lungs every 4 (four) hours as needed for wheezing or shortness of breath (cough, shortness of breath or wheezing.).   AMBULATORY NON FORMULARY MEDICATION CPAP at night   AMBULATORY NON FORMULARY MEDICATION UDO's Oil 3-6-9 Blend Take 1 tbsp every day   AMBULATORY NON FORMULARY MEDICATION Cardio Protegen Liquid 2 tbsp every morning   AMBULATORY NON FORMULARY MEDICATION Inner Eco Liquid Take 1 tbsp every morning   AMBULATORY NON FORMULARY MEDICATION Herbal Aloe Take 2 tbsp every morning   AMBULATORY NON FORMULARY MEDICATION Carnivora  Take 1 tablet twice a day   beclomethasone 40 MCG/ACT inhaler Commonly known as:  QVAR REDIHALER Inhale 1 puff into the lungs 2 (two) times daily.   cetirizine 10 MG tablet Commonly known as:  ZYRTEC Take 10 mg by mouth as needed for allergies.   dabigatran 150 MG Caps capsule Commonly known as:  PRADAXA Take 1 capsule (150 mg total) by mouth 2 (two) times daily.   diltiazem 180 MG 24 hr capsule Commonly known as:  CARTIA XT Take 1 capsule (180 mg total) by mouth daily.   dutasteride 0.5 MG capsule Commonly known as:  AVODART TK 1 C PO QD   fluticasone 50 MCG/ACT nasal spray Commonly known as:  FLONASE INSTILL 2 SPRAYS IN EACH NOSTRIL EVERY DAY AS DIRECTED   metoprolol succinate 50 MG 24 hr tablet Commonly known as:  TOPROL-XL TAKE 1 TABLET BY MOUTH EVERY DAY WITH OR FOLLOWING A MEAL   montelukast 10 MG tablet Commonly known as:  SINGULAIR Take 1 tablet (10 mg total) by mouth at bedtime.   omeprazole 20 MG capsule Commonly known as:  PRILOSEC TAKE ONE CAPSULE BY MOUTH EVERY DAY   pravastatin 20 MG tablet Commonly known as:  PRAVACHOL Take 20 mg daily by mouth.   PREVAGEN PO Take 1 tablet by mouth daily.   tamsulosin 0.4 MG Caps capsule Commonly known as:  FLOMAX Take 0.4 mg by mouth.   zolpidem 5 MG tablet Commonly known as:  AMBIEN Take 0.5-1 tablets (2.5-5 mg total) by  mouth at bedtime as needed. For anxiety/insomnia

## 2017-06-19 NOTE — Telephone Encounter (Signed)
Pt takes Pradaxa for afib with CHADS2VASc score of 2 (age x2). Pt has not had renal function checked in almost 3 years. Pt will need BMET checked to assess clearance of Pradaxa. If renal function is normal, he can hold his Pradaxa for 3 days prior to procedure.

## 2017-06-20 ENCOUNTER — Other Ambulatory Visit: Payer: Medicare PPO | Admitting: *Deleted

## 2017-06-20 NOTE — Telephone Encounter (Signed)
This pt needs a BMet. I will send to pre-op call back pool to have this arranged. If he has had recent labs elsewhere we would need to get a copy.   APP- Please see pharmacy notes once results are back.

## 2017-06-20 NOTE — Telephone Encounter (Signed)
Spoke with pt and he is able to come in today 4/23 for lab work.

## 2017-06-20 NOTE — Addendum Note (Signed)
Addended by: Mendel Ryder on: 06/20/2017 02:30 PM   Modules accepted: Orders

## 2017-06-20 NOTE — Telephone Encounter (Signed)
   Primary Cardiologist: Peter Martinique, MD  Chart reviewed as part of pre-operative protocol coverage. Patient was contacted 06/20/2017 in reference to pre-operative risk assessment for pending surgery as outlined below.  [name]. was last seen on 01/09/2017 by [name].  Since that day, [name]. has done very well from a cardiac perspetive.  Therefore, based on ACC/AHA guidelines, the patient would be at acceptable risk for the planned procedure without further cardiovascular testing.    **Once his labwork is final, if his renal function is normal he will be OK to hold his Pradaxa for 3 days prior to the procedure.   Once final we will route this recommendation to the requesting party via [facility] fax function and remove from pre-op pool.  Please call with questions.  Daune Perch, NP 06/20/2017, 5:06 PM

## 2017-06-21 ENCOUNTER — Ambulatory Visit: Payer: Medicare PPO | Admitting: Pulmonary Disease

## 2017-06-21 LAB — BASIC METABOLIC PANEL
BUN/Creatinine Ratio: 14 (ref 10–24)
BUN: 17 mg/dL (ref 8–27)
CALCIUM: 9 mg/dL (ref 8.6–10.2)
CO2: 25 mmol/L (ref 20–29)
CREATININE: 1.21 mg/dL (ref 0.76–1.27)
Chloride: 102 mmol/L (ref 96–106)
GFR, EST AFRICAN AMERICAN: 66 mL/min/{1.73_m2} (ref >59)
GFR, EST NON AFRICAN AMERICAN: 57 mL/min/{1.73_m2} — AB (ref >59)
Glucose: 120 mg/dL — ABNORMAL HIGH (ref 65–99)
Potassium: 4.2 mmol/L (ref 3.5–5.2)
Sodium: 139 mmol/L (ref 134–144)

## 2017-06-22 NOTE — Telephone Encounter (Signed)
Renal function is OK. Can hold his Pradaxa for 3 days prior to procedure.  I will efax to Dr. Delrae Alfred

## 2017-06-30 DIAGNOSIS — F419 Anxiety disorder, unspecified: Secondary | ICD-10-CM | POA: Insufficient documentation

## 2017-06-30 DIAGNOSIS — I714 Abdominal aortic aneurysm, without rupture, unspecified: Secondary | ICD-10-CM | POA: Insufficient documentation

## 2017-07-02 ENCOUNTER — Encounter: Payer: Self-pay | Admitting: Gastroenterology

## 2017-07-19 ENCOUNTER — Encounter: Payer: Self-pay | Admitting: Podiatry

## 2017-07-19 ENCOUNTER — Ambulatory Visit: Payer: Medicare PPO | Admitting: Podiatry

## 2017-07-19 DIAGNOSIS — B351 Tinea unguium: Secondary | ICD-10-CM

## 2017-07-19 DIAGNOSIS — M79676 Pain in unspecified toe(s): Secondary | ICD-10-CM

## 2017-07-19 NOTE — Progress Notes (Signed)
Patient ID: [name]., male   DOB: 04/28/1940, 77 y.o.   MRN: [id number] Complaint:  Visit Type: Patient returns to my office for continued preventative foot care services. Complaint: Patient states" my nails have grown long and thick and become painful to walk and wear shoes". The patient presents for preventative foot care services. No changes to ROS.  Callus right foot is no longer painful.  Patient has not been seen for 7 months.  Podiatric Exam: Vascular: dorsalis pedis and posterior tibial pulses are palpable bilateral. Capillary return is immediate. Temperature gradient is WNL. Skin turgor WNL  Sensorium: Normal Semmes Weinstein monofilament test. Normal tactile sensation bilaterally. Nail Exam: Pt has thick disfigured discolored nails with subungual debris noted bilateral entire nail hallux through fifth toenails Ulcer Exam: There is no evidence of ulcer or pre-ulcerative changes or infection. Orthopedic Exam: Muscle tone and strength are WNL. No limitations in general ROM. No crepitus or effusions noted. Foot type and digits show no abnormalities. Bony prominences are unremarkable. Skin:    Porokeratosis sub 3 right asymptomatic . No infection or ulcers  Diagnosis:  Onychomycosis, , Pain in right toe, pain in left toes   Treatment & Plan Procedures and Treatment: Consent by patient was obtained for treatment procedures. The patient understood the discussion of treatment and procedures well. All questions were answered thoroughly reviewed. Debridement of mycotic and hypertrophic toenails, 1 through 5 bilateral and clearing of subungual debris. No ulceration, no infection noted.   Return Visit-Office Procedure: Patient instructed to return to the office for a follow up visit 10 weeks  for continued evaluation and treatment.    Gardiner Barefoot DPM

## 2017-07-26 ENCOUNTER — Ambulatory Visit: Payer: Medicare PPO | Admitting: Pulmonary Disease

## 2017-07-26 ENCOUNTER — Encounter: Payer: Self-pay | Admitting: Pulmonary Disease

## 2017-07-26 VITALS — BP 118/76 | HR 80 | Ht 70.0 in | Wt 191.8 lb

## 2017-07-26 DIAGNOSIS — R058 Other specified cough: Secondary | ICD-10-CM

## 2017-07-26 DIAGNOSIS — Z9989 Dependence on other enabling machines and devices: Secondary | ICD-10-CM | POA: Diagnosis not present

## 2017-07-26 DIAGNOSIS — G4733 Obstructive sleep apnea (adult) (pediatric): Secondary | ICD-10-CM | POA: Diagnosis not present

## 2017-07-26 DIAGNOSIS — R0981 Nasal congestion: Secondary | ICD-10-CM

## 2017-07-26 DIAGNOSIS — R05 Cough: Secondary | ICD-10-CM

## 2017-07-26 DIAGNOSIS — J4520 Mild intermittent asthma, uncomplicated: Secondary | ICD-10-CM | POA: Diagnosis not present

## 2017-07-26 MED ORDER — FEXOFENADINE-PSEUDOEPHED ER 60-120 MG PO TB12: 1.0000 | ORAL_TABLET | Freq: Two times a day (BID) | ORAL | Status: DC

## 2017-07-26 MED ORDER — OXYMETAZOLINE HCL 0.05 % NA SOLN
1.0000 | Freq: Two times a day (BID) | NASAL | 0 refills | Status: DC
Start: 2017-07-26 — End: 2017-11-03

## 2017-07-26 NOTE — Patient Instructions (Addendum)
Afrin one spray each nostril twice per day for 2 days only Nasal irrigation (saline nasal spray) twice per day until sinus congestion improves, and then as needed Flonase 1 spray each nostril twice per day until sinus congestion improves, then go back to using once per day Use allegra-d twice daily for one week, then change back to regular zyrtec Singulair 10 mg pill nightly Can look up travel CPAP machines at [email or web address] or similar website  Follow up in 1 year

## 2017-07-26 NOTE — Progress Notes (Signed)
[facility]  Chief Complaint  Patient presents with  . Follow-up    Pt is having increase congestion and cannot use cpap machine    Vital signs: BP 118/76 (BP Location: Left Arm, Cuff Size: Normal)   Pulse 80   Ht 5\' 10"  (1.778 m)   Wt 191 lb 12.8 oz (87 kg)   SpO2 100%   BMI 27.52 kg/m   History of Present Illness: [name]. is a 77 y.o. male with COPD with asthma, rhinitis and OSA.  He has noticed more sinus congestion and post nasal drip.  He is having trouble using CPAP as a result.  He is not having fever, chest pain, cough, sputum, ear pain, skin rash, gland swelling, sore throat, or wheezing.  He has been using zyrtec, singulair, and flonase.  Not using any other nose sprays.  Physical Exam:  General - pleasant, nasal voice Eyes - pupils reactive ENT - no sinus tenderness, clear nasal drainage, cerumen build up b/l, no oral exudate, no LAN Cardiac - regular, no murmur Chest - no wheeze, rales Abd - soft, non tender Ext - no edema Skin - no rashes Neuro - normal strength Psych - normal mood   Assessment/Plan:  Upper airway cough syndrome with progressive sinus congestion. - afrin bid for 2 days - allegra d bid for 1 week, then back to zyrtec - flonase bid until improved, then back to daily - nasal irrigation bid until improved, then prn - continue singulair  Mild intermittent asthma. - continue singulair - prn Qvar if he gets worse  Obstructive sleep apnea. - advised him to resume CPAP when sinus symptoms improved - continue auto CPAP  - he will look into options for travel CPAP   Patient Instructions  Afrin one spray each nostril twice per day for 2 days only Nasal irrigation (saline nasal spray) twice per day until sinus congestion improves, and then as needed Flonase 1 spray each nostril twice per day until sinus congestion improves, then go back to using once per day Use allegra-d twice daily for  one week, then change back to regular zyrtec Singulair 10 mg pill nightly Can look up travel CPAP machines at [email or web address] or similar website  Follow up in 1 year    [name], MD [place] 07/26/2017, 10:02 AM  Flow Sheet  Sleep tests: PSG 08/05/10>>AHI 24.2, SpO2 low 77%, PLMI 15.8. PSG with oral appliance 02/07/12>>AHI 11.5, SpO2 low 78%. Both obstructive and central events. Auto CPAP 01/14/14 to 02/12/14 >> used on 17 of 30 nights with average 4 hrs and 55 min.  Average AHI is 11 with median CPAP 7 cm H2O and 95 th percentile CPAP 9 cm H20.  Mostly central events.  Pulmonary tests PFT 07/26/11>>FEV1 2.62(91%), FEV1% 66, TLC 6.55(101%), DLCO 94%, +BD  Past Medical History: He  has a past medical history of Allergy, Atrial fibrillation ([place]), Barrett esophagus, Barrett's esophagus, BPH (benign prostatic hyperplasia), Chronic obstructive asthma, Difficult intubation (13 or 14 years ago, at [facility] long), ED (erectile dysfunction), GERD (gastroesophageal reflux disease), Hammer toe of right foot, Hiatal hernia, OSA (obstructive sleep apnea) (08/05/10 PSG), Paroxysmal atrial fibrillation ([place]), and Renal cell carcinoma (2009).  Past Surgical History: He  has a past surgical history that includes exploratory laparotomy, segmental small bowel resection of a hemangioma, repair of incarcerated umbelical hernia (25/3664); Exploratory laparotomy w/ bowel resection (12/1999); renal cancer surgery with RF Ablation; Tonsillectomy; Excision basal cell carcinoma; Eye  surgery; and Esophagogastroduodenoscopy (egd) with propofol (N/A, 08/26/2014).  Family History: His family history includes Asthma in his paternal grandmother; Diabetes in his brother, father, and mother; Heart attack in his paternal grandfather; Kidney cancer in his paternal aunt; Obesity in his brother; Stroke in his mother.  Social History: He  reports that he quit smoking about 37 years ago. His smoking use  included cigars. He quit after 10.00 years of use. He has never used smokeless tobacco. He reports that he does not drink alcohol or use drugs.  Medications: Allergies as of 07/26/2017      Reactions   Tamsulosin Other (See Comments)   dizziness   Proair Hfa [albuterol] Palpitations   Only use in emergency due to has heart problems      Medication List        Accurate as of 07/26/17 10:02 AM. Always use your most recent med list.          albuterol 108 (90 Base) MCG/ACT inhaler Commonly known as:  PROVENTIL HFA;VENTOLIN HFA Inhale 2 puffs into the lungs every 4 (four) hours as needed for wheezing or shortness of breath (cough, shortness of breath or wheezing.).   AMBULATORY NON FORMULARY MEDICATION CPAP at night   AMBULATORY NON FORMULARY MEDICATION UDO's Oil 3-6-9 Blend Take 1 tbsp every day   AMBULATORY NON FORMULARY MEDICATION Cardio Protegen Liquid 2 tbsp every morning   AMBULATORY NON FORMULARY MEDICATION Inner Eco Liquid Take 1 tbsp every morning   AMBULATORY NON FORMULARY MEDICATION Herbal Aloe Take 2 tbsp every morning   AMBULATORY NON FORMULARY MEDICATION Carnivora  Take 1 tablet twice a day   beclomethasone 40 MCG/ACT inhaler Commonly known as:  QVAR REDIHALER Inhale 1 puff into the lungs 2 (two) times daily.   cetirizine 10 MG tablet Commonly known as:  ZYRTEC Take 10 mg by mouth as needed for allergies.   dabigatran 150 MG Caps capsule Commonly known as:  PRADAXA Take 1 capsule (150 mg total) by mouth 2 (two) times daily.   diltiazem 180 MG 24 hr capsule Commonly known as:  CARTIA XT Take 1 capsule (180 mg total) by mouth daily.   dutasteride 0.5 MG capsule Commonly known as:  AVODART TK 1 C PO QD   fexofenadine-pseudoephedrine 60-120 MG 12 hr tablet Commonly known as:  ALLEGRA-D ALLERGY & CONGESTION Take 1 tablet by mouth 2 (two) times daily.   fluticasone 50 MCG/ACT nasal spray Commonly known as:  FLONASE INSTILL 2 SPRAYS IN EACH  NOSTRIL EVERY DAY AS DIRECTED   MAXILIFE RICE TOCOTRIENOLS 50-26 UNIT-MG Caps Take by mouth.   metoprolol succinate 50 MG 24 hr tablet Commonly known as:  TOPROL-XL TAKE 1 TABLET BY MOUTH EVERY DAY WITH OR FOLLOWING A MEAL   montelukast 10 MG tablet Commonly known as:  SINGULAIR Take 1 tablet (10 mg total) by mouth at bedtime.   omeprazole 20 MG capsule Commonly known as:  PRILOSEC TAKE ONE CAPSULE BY MOUTH EVERY DAY   oxymetazoline 0.05 % nasal spray Commonly known as:  AFRIN NASAL SPRAY Place 1 spray into both nostrils 2 (two) times daily.   pravastatin 20 MG tablet Commonly known as:  PRAVACHOL Take 20 mg daily by mouth.   PREVAGEN PO Take 1 tablet by mouth daily.   tamsulosin 0.4 MG Caps capsule Commonly known as:  FLOMAX Take 0.4 mg by mouth.   zolpidem 5 MG tablet Commonly known as:  AMBIEN Take 0.5-1 tablets (2.5-5 mg total) by mouth at bedtime as needed.  For anxiety/insomnia

## 2017-08-02 ENCOUNTER — Telehealth: Payer: Self-pay | Admitting: Pulmonary Disease

## 2017-08-02 NOTE — Telephone Encounter (Signed)
I can't locate this form in Dr. Juanetta Gosling look at.  Kelli - do you have this form? Or do we need to re-request it?

## 2017-08-03 ENCOUNTER — Telehealth: Payer: Self-pay | Admitting: Gastroenterology

## 2017-08-03 NOTE — Telephone Encounter (Signed)
Yes he will.

## 2017-08-03 NOTE — Telephone Encounter (Signed)
Left voice mail on machine for patient to return phone call back regarding forms. X1  Routing message to VS regarding if he has rec'd patient's medical necessity forms yesterday? VS please advise

## 2017-08-03 NOTE — Telephone Encounter (Signed)
Patient is due for recall egd but is on BT Pradaxa. Patient is wanting to know since he was seen this year does he have to come in for ov before pv. Last ov with Dr.Stark was 01.2019. Please advise.

## 2017-08-04 NOTE — Telephone Encounter (Signed)
Called and spoke with Broadus John with [employer] Phone: [phone] Broadus John has no pending orders for pt; nor any medical necessity forms needing completed at this time.   LVM with pt regarding the need of medical necessity forms.  Elenor Quinones is calling to see if patient's medical necessity form has been completed. If not fax form to [phone] Informed [place] with [email or web address] that we have not rec'd these forms, that they need refaxed today. Provided the fax number; will look for these forms.

## 2017-08-04 NOTE — Telephone Encounter (Signed)
Patient returned [facility] call Discussed with patient's the reason for [name] call Per patient, that company should not have contacted this office because pt was simply inquiring/looking for a new CPAP mask but had not decided on one  Per pt patient, nothing further is needed Will sign off and route back to [facility] to make her aware

## 2017-08-04 NOTE — Telephone Encounter (Signed)
I have not received CMN form.  Please check with his DME to send form.

## 2017-08-18 ENCOUNTER — Other Ambulatory Visit: Payer: Self-pay

## 2017-08-18 ENCOUNTER — Ambulatory Visit: Payer: Medicare PPO | Admitting: Physician Assistant

## 2017-08-18 VITALS — BP 124/73 | HR 85 | Temp 98.5°F | Resp 17 | Ht 70.0 in | Wt 191.2 lb

## 2017-08-18 DIAGNOSIS — R21 Rash and other nonspecific skin eruption: Secondary | ICD-10-CM | POA: Diagnosis not present

## 2017-08-18 DIAGNOSIS — R159 Full incontinence of feces: Secondary | ICD-10-CM | POA: Diagnosis not present

## 2017-08-18 DIAGNOSIS — K6289 Other specified diseases of anus and rectum: Secondary | ICD-10-CM | POA: Diagnosis not present

## 2017-08-18 MED ORDER — LIDOCAINE (ANORECTAL) 5 % EX GEL: 1.0000 "application " | Freq: Three times a day (TID) | CUTANEOUS | 0 refills | Status: DC

## 2017-08-18 NOTE — Progress Notes (Signed)
[name].  MRN: [id number] DOB: 28-Aug-1940  PCP: Derinda Late, MD  Subjective:  Pt is a 77 year old male who presents to clinic for anal itching x several weeks. "I think I wiped too hard".  Endorses itching and pain around his rectum.  This has been a problem for him for a few weeks.  He endorses occasional blood on the toilet paper when he wipes..   Of note, he also endorses anal leakage x 3 months.  Started wearing an adult pad in the back of his underpants due to stool leaking out. He endorses no changes in his daily bowel movements.  Stools are well formed.  He does not strain to have a bowel movement.  Denies abdominal pain, abdominal bloating, blood in his stool, pencil thin stools, melena He is up-to-date on his colonoscopy.  He sees his primary care provider regularly, next appointment is not for several months. Denies sexual activity. He has not been sexually active in 1.5 years.   Review of Systems  Gastrointestinal: Positive for rectal pain ("itching"). Negative for abdominal distention, abdominal pain, anal bleeding, blood in stool, constipation, diarrhea and vomiting.  Genitourinary: Negative for penile pain, penile swelling and scrotal swelling.    Patient Active Problem List   Diagnosis Date Noted  . Duodenal nodule   . BPH (benign prostatic hyperplasia) 11/07/2011  . Hip pain 07/04/2011  . Perianal itch 07/04/2011  . OSA (obstructive sleep apnea) 08/08/2010  . Chronic obstructive asthma ([place]) 07/14/2010  . Rhinitis 07/14/2010  . Cough 07/13/2010  . Barrett's esophagus 03/11/2009  . Atrial fibrillation ([place]) 07/29/2008  . GERD 07/29/2008    Current Outpatient Medications on File Prior to Visit  Medication Sig Dispense Refill  . albuterol (PROVENTIL HFA;VENTOLIN HFA) 108 (90 BASE) MCG/ACT inhaler Inhale 2 puffs into the lungs every 4 (four) hours as needed for wheezing or shortness of breath (cough, shortness of breath or wheezing.). 1 Inhaler 1  .  AMBULATORY NON FORMULARY MEDICATION CPAP at night    . AMBULATORY NON FORMULARY MEDICATION UDO's Oil 3-6-9 Blend Take 1 tbsp every day    . AMBULATORY NON FORMULARY MEDICATION Cardio Protegen Liquid 2 tbsp every morning    . AMBULATORY NON FORMULARY MEDICATION Inner Eco Liquid Take 1 tbsp every morning    . AMBULATORY NON FORMULARY MEDICATION Herbal Aloe Take 2 tbsp every morning    . AMBULATORY NON FORMULARY MEDICATION Carnivora  Take 1 tablet twice a day    . Apoaequorin (PREVAGEN PO) Take 1 tablet by mouth daily.     . beclomethasone (QVAR REDIHALER) 40 MCG/ACT inhaler Inhale 1 puff into the lungs 2 (two) times daily. 1 Inhaler 5  . cetirizine (ZYRTEC) 10 MG tablet Take 10 mg by mouth as needed for allergies.     Marland Kitchen dabigatran (PRADAXA) 150 MG CAPS capsule Take 1 capsule (150 mg total) by mouth 2 (two) times daily. 60 capsule 6  . diltiazem (CARTIA XT) 180 MG 24 hr capsule Take 1 capsule (180 mg total) by mouth daily. 30 capsule 11  . dutasteride (AVODART) 0.5 MG capsule TK 1 C PO QD  11  . fexofenadine-pseudoephedrine (ALLEGRA-D ALLERGY & CONGESTION) 60-120 MG 12 hr tablet Take 1 tablet by mouth 2 (two) times daily.    . fluticasone (FLONASE) 50 MCG/ACT nasal spray INSTILL 2 SPRAYS IN EACH NOSTRIL EVERY DAY AS DIRECTED 16 g 5  . metoprolol succinate (TOPROL-XL) 50 MG 24 hr tablet TAKE 1 TABLET BY MOUTH EVERY  DAY WITH OR FOLLOWING A MEAL 30 tablet 8  . montelukast (SINGULAIR) 10 MG tablet Take 1 tablet (10 mg total) by mouth at bedtime. 30 tablet 5  . omeprazole (PRILOSEC) 20 MG capsule TAKE ONE CAPSULE BY MOUTH EVERY DAY 30 capsule 0  . oxymetazoline (AFRIN NASAL SPRAY) 0.05 % nasal spray Place 1 spray into both nostrils 2 (two) times daily. 30 mL 0  . pravastatin (PRAVACHOL) 20 MG tablet Take 20 mg daily by mouth.    . tamsulosin (FLOMAX) 0.4 MG CAPS capsule Take 0.4 mg by mouth.    . Vitamin E-Tocotrienols (MAXILIFE RICE TOCOTRIENOLS) 50-26 UNIT-MG CAPS Take by mouth.    . zolpidem  (AMBIEN) 5 MG tablet Take 0.5-1 tablets (2.5-5 mg total) by mouth at bedtime as needed. For anxiety/insomnia 30 tablet 0   No current facility-administered medications on file prior to visit.     Allergies  Allergen Reactions  . Tamsulosin Other (See Comments)    dizziness  . Proair Hfa [Albuterol] Palpitations    Only use in emergency due to has heart problems     Objective:  BP 124/73 (BP Location: Right Arm, Patient Position: Sitting, Cuff Size: Normal)   Pulse 85   Temp 98.5 F (36.9 C) (Oral)   Resp 17   Ht 5\' 10"  (1.778 m)   Wt 191 lb 3.2 oz (86.7 kg)   SpO2 98%   BMI 27.43 kg/m   Physical Exam  Constitutional: He is oriented to person, place, and time. He appears well-developed and well-nourished.  Genitourinary:     Genitourinary Comments: Desquamation extending from rectum and out circumferentially about 3 cm. Excessive amount of <0.5 cm ulcerated lesions scattered about the area in a shotgun pattern. Ulcerations are superficial.  Lesions are friable. Moderate amount of stool about the area. TTP.  Normal-to-weak sphincter tone.   Neurological: He is alert and oriented to person, place, and time.  Skin: Skin is warm and dry.  Psychiatric: He has a normal mood and affect. His behavior is normal. Judgment and thought content normal.  Vitals reviewed.   Assessment and Plan :  1. Incontinence of feces, unspecified fecal incontinence type - Pt presents with 3 month history of stool leaking from rectum. This is a new problem. Suspect skin irritation and breakdown from constant contact with stool.  Plan to refer to GI for evaluation of stool leakage and treatment of skin lesion. Advised sitz baths.  - Ambulatory referral to Gastroenterology  2. Perirectal skin irritation - Lidocaine, Anorectal, 5 % GEL; Apply 1 application topically 3 (three) times daily. Apply a pea-sized amount to the affected area 3 times daily.  Dispense: 30 g; Refill: 0  3. Rash and nonspecific  skin eruption - Cannot r/o new onset HSV infection. Culture is pending.  - Herpes simplex virus culture   Mercer Pod, PA-C  [facility] 08/18/2017 2:08 PM

## 2017-08-18 NOTE — Patient Instructions (Addendum)
  Apply lidocaine gel to area 3-4 times daily.   Warm sitz baths, which can relax the anal sphincter and improve blood flow to the anal mucosa, are recommended for patients with anal fissures. During a sitz bath, the anus is immersed in warm water for approximately 10 to 15 minutes two to three times daily. Sitz bath kits are available in most drugstores. At home, it is also possible to use a bathtub for sitz bath by filling it with two to three inches of warm water. Additives such as soap and bubble bath are not recommended. After a sitz bath, it is important to towel or blow dry (with a hair dryer on low heat setting) the anal area well.  I will contact you with plan of treatment.   Thank you for coming in today. I hope you feel we met your needs.  Feel free to call PCP if you have any questions or further requests.  Please consider signing up for MyChart if you do not already have it, as this is a great way to communicate with me.  Best,  Whitney McVey, PA-C  IF you received an x-ray today, you will receive an invoice from [facility]. Please contact [facility] at [phone] with questions or concerns regarding your invoice.   IF you received labwork today, you will receive an invoice from [facility]. Please contact [facility] at [phone] with questions or concerns regarding your invoice.   Our billing staff will not be able to assist you with questions regarding bills from these companies.  You will be contacted with the lab results as soon as they are available. The fastest way to get your results is to activate your My Chart account. Instructions are located on the last page of this paperwork. If you have not heard from [place] regarding the results in 2 weeks, please contact this office.

## 2017-08-19 DIAGNOSIS — R159 Full incontinence of feces: Secondary | ICD-10-CM | POA: Insufficient documentation

## 2017-08-21 ENCOUNTER — Telehealth: Payer: Self-pay | Admitting: Gastroenterology

## 2017-08-21 LAB — HERPES SIMPLEX VIRUS CULTURE

## 2017-08-21 NOTE — Telephone Encounter (Signed)
Patient was seen in the ED for rectal pain on Friday.  He will come in and see Alonza Bogus, PA on 08/24/17 2:30

## 2017-08-24 ENCOUNTER — Encounter: Payer: Self-pay | Admitting: Gastroenterology

## 2017-08-24 ENCOUNTER — Ambulatory Visit: Payer: Medicare PPO | Admitting: Gastroenterology

## 2017-08-24 VITALS — BP 118/78 | HR 82 | Ht 70.0 in | Wt 191.0 lb

## 2017-08-24 DIAGNOSIS — K22719 Barrett's esophagus with dysplasia, unspecified: Secondary | ICD-10-CM

## 2017-08-24 DIAGNOSIS — R159 Full incontinence of feces: Secondary | ICD-10-CM | POA: Diagnosis not present

## 2017-08-24 NOTE — Progress Notes (Signed)
08/24/2017 [name] [id number] [birth year]   HISTORY OF PRESENT ILLNESS:  This is a 77 year old male who is a patient of Dr. Lynne Leader.  He is here today with complaints of fecal leakage.  Says that this has been occurring for the past week.  Never had anything similar to this in the past.  Moving his bowels without issues, but leaking stool in between BM's without his knowledge.  Having to wear pads to catch the stool.  Has been using recticare that was given to him at the [facility] for perianal irritation.  Trace of light colored blood on the pad at times from the irritation, no blood in the stool itself.  Has Barrett's esophagus and is due for recall EGD.  Wants to schedule that as well while he is here.  Is on Pradaxa for atrial fibrillation, which he has been on for several years.  prescrbied by [name].   Past Medical History:  Diagnosis Date  . Allergy   . Atrial fibrillation ([place])   . Barrett esophagus   . Barrett's esophagus   . BPH (benign prostatic hyperplasia)   . Chronic obstructive asthma   . Difficult intubation 13 or 14 years ago, at [facility] long   airway was up near nostrils with intubation per patient with bowel surgery  . ED (erectile dysfunction)   . GERD (gastroesophageal reflux disease)   . Hammer toe of right foot   . Hiatal hernia   . OSA (obstructive sleep apnea) 08/05/10 [facility]   AHI 24  . Paroxysmal atrial fibrillation ([place])    Hospitalized on 05/27/10  . Renal cell carcinoma 2009   right renal mass, followed by Dr. Ander Slade   Past Surgical History:  Procedure Laterality Date  . BASAL CELL CARCINOMA EXCISION    . ESOPHAGOGASTRODUODENOSCOPY (EGD) WITH PROPOFOL N/A 08/26/2014   Procedure: ESOPHAGOGASTRODUODENOSCOPY (EGD) WITH PROPOFOL;  Surgeon: [name], MD;  Location: [facility] ENDOSCOPY;  Service: Endoscopy;  Laterality: N/A;  . EXPLORATORY LAPAROTOMY W/ BOWEL RESECTION  12/1999   segmental sbr of a hemangioma, repair of incarcerated umbilical  hernia  . exploratory laparotomy, segmental small bowel resection of a hemangioma, repair of incarcerated umbelical hernia  [id number]/9390  . EYE SURGERY     cataracts done  . renal cancer surgery with RF Ablation    . TONSILLECTOMY      reports that he quit smoking about 37 years ago. His smoking use included cigars. He quit after 10.00 years of use. He has never used smokeless tobacco. He reports that he does not drink alcohol or use drugs. family history includes Asthma in his paternal grandmother; Diabetes in his brother, father, and mother; Heart attack in his paternal grandfather; Kidney cancer in his paternal aunt; Obesity in his brother; Stroke in his mother. Allergies  Allergen Reactions  . Tamsulosin Other (See Comments)    dizziness  . Proair Hfa [Albuterol] Palpitations    Only use in emergency due to has heart problems      Outpatient Encounter Medications as of 08/24/2017  Medication Sig  . albuterol (PROVENTIL HFA;VENTOLIN HFA) 108 (90 BASE) MCG/ACT inhaler Inhale 2 puffs into the lungs every 4 (four) hours as needed for wheezing or shortness of breath (cough, shortness of breath or wheezing.).  Marland Kitchen alfuzosin (UROXATRAL) 10 MG 24 hr tablet Take 1 tablet by mouth daily.  . AMBULATORY NON FORMULARY MEDICATION CPAP at night  . AMBULATORY NON FORMULARY MEDICATION UDO's Oil 3-6-9 Blend Take  1 tbsp every day  . AMBULATORY NON FORMULARY MEDICATION Cardio Protegen Liquid 2 tbsp every morning  . AMBULATORY NON FORMULARY MEDICATION Inner Eco Liquid Take 1 tbsp every morning  . AMBULATORY NON FORMULARY MEDICATION Herbal Aloe Take 2 tbsp every morning  . AMBULATORY NON FORMULARY MEDICATION Carnivora  Take 1 tablet twice a day  . Apoaequorin (PREVAGEN PO) Take 1 tablet by mouth daily.   . beclomethasone (QVAR REDIHALER) 40 MCG/ACT inhaler Inhale 1 puff into the lungs 2 (two) times daily.  . cetirizine (ZYRTEC) 10 MG tablet Take 10 mg by mouth as needed for allergies.   Marland Kitchen dabigatran  (PRADAXA) 150 MG CAPS capsule Take 1 capsule (150 mg total) by mouth 2 (two) times daily.  Marland Kitchen diltiazem (CARTIA XT) 180 MG 24 hr capsule Take 1 capsule (180 mg total) by mouth daily.  Marland Kitchen dutasteride (AVODART) 0.5 MG capsule TK 1 C PO QD  . fexofenadine-pseudoephedrine (ALLEGRA-D ALLERGY & CONGESTION) 60-120 MG 12 hr tablet Take 1 tablet by mouth 2 (two) times daily.  . fluticasone (FLONASE) 50 MCG/ACT nasal spray INSTILL 2 SPRAYS IN EACH NOSTRIL EVERY DAY AS DIRECTED  . Lidocaine, Anorectal, 5 % GEL Apply 1 application topically 3 (three) times daily. Apply a pea-sized amount to the affected area 3 times daily.  . metoprolol succinate (TOPROL-XL) 50 MG 24 hr tablet TAKE 1 TABLET BY MOUTH EVERY DAY WITH OR FOLLOWING A MEAL  . montelukast (SINGULAIR) 10 MG tablet Take 1 tablet (10 mg total) by mouth at bedtime.  Marland Kitchen omeprazole (PRILOSEC) 20 MG capsule TAKE ONE CAPSULE BY MOUTH EVERY DAY  . oxymetazoline (AFRIN NASAL SPRAY) 0.05 % nasal spray Place 1 spray into both nostrils 2 (two) times daily.  . pravastatin (PRAVACHOL) 20 MG tablet Take 20 mg daily by mouth.  . tamsulosin (FLOMAX) 0.4 MG CAPS capsule Take 0.4 mg by mouth.  . Vitamin E-Tocotrienols (MAXILIFE RICE TOCOTRIENOLS) 50-26 UNIT-MG CAPS Take by mouth.  . zolpidem (AMBIEN) 5 MG tablet Take 0.5-1 tablets (2.5-5 mg total) by mouth at bedtime as needed. For anxiety/insomnia   No facility-administered encounter medications on file as of 08/24/2017.      REVIEW OF SYSTEMS  : All other systems reviewed and negative except where noted in the History of Present Illness.   PHYSICAL EXAM: BP 118/78   Pulse 82   Ht 5\' 10"  (1.778 m)   Wt 191 lb (86.6 kg)   BMI 27.41 kg/m  General: Well developed white male in no acute distress Head: Normocephalic and atraumatic Eyes:  Sclerae anicteric, conjunctiva pink. Ears: Normal auditory acuity Lungs: Clear throughout to auscultation; no increased WOB. Heart: Regular rate and rhythm; no M/R/G. Abdomen:  Soft, non-distended.  BS present.  Non-tender. Rectal:  A lot of soft stool externally in perianal area with skin irritation.  DRE revealed a lot of soft stool in rectum. Musculoskeletal: Symmetrical with no gross deformities  Skin: No lesions on visible extremities Extremities: No edema  Neurological: Alert oriented x 4, grossly non-focal. Psychological:  Alert and cooperative. Normal mood and affect  ASSESSMENT AND PLAN: *Fecal leakage:  Has only been present for the past week.  Has a lot of soft stool in his rectum.  ? If he is just not emptying well.  Sphincter tone ok.  Will have him use two fleets enemas tonight to clean out his rectum.  Will have him start powder Benefiber or Citrucel as well.  *Perianal irritation:  Likely from stool soilage.  Will apply Desitin  to affected area multiple times per day and particularly at night. *Barrett's esophagus:  Due for repeat EGD.  Will schedule with Dr. Fuller Plan. *Chronic anticoagulation with Pradaxa due to atrial fibrillation:  Will hold Pradaxa for 2 days prior to endoscopic procedures - will instruct when and how to resume after procedure. Benefits and risks of procedure explained including risks of bleeding, perforation, infection, missed lesions, reactions to medications and possible need for hospitalization and surgery for complications. Additional rare but real risk of stroke or other vascular clotting events off of Pradaxa also explained and need to seek urgent help if any signs of these problems occur. Will communicate by phone or EMR with patient's prescribing provider, [name], to confirm that holding Pradaxa is reasonable in this case.    CC:  Derinda Late, MD

## 2017-08-24 NOTE — Patient Instructions (Signed)
We have sent the following medications to your pharmacy for you to pick up at your convenience:  2 fleet enemas tonight   Benefiber or Citrucel once daily.   [place] to perinanal area.   You have been scheduled for an endoscopy. Please follow written instructions given to you at your visit today. If you use inhalers (even only as needed), please bring them with you on the day of your procedure. Your physician has requested that you go to www.startemmi.com and enter the access code given to you at your visit today. This web site gives a general overview about your procedure. However, you should still follow specific instructions given to you by our office regarding your preparation for the procedure.

## 2017-08-25 ENCOUNTER — Other Ambulatory Visit: Payer: Self-pay | Admitting: Pharmacist

## 2017-08-25 MED ORDER — DABIGATRAN ETEXILATE MESYLATE 150 MG PO CAPS: 150.0000 mg | ORAL_CAPSULE | Freq: Two times a day (BID) | ORAL | 5 refills | Status: DC

## 2017-08-25 NOTE — Progress Notes (Signed)
Reviewed and agree with initial management plan.  Malcolm T. Stark, MD FACG 

## 2017-09-04 ENCOUNTER — Telehealth: Payer: Self-pay | Admitting: Gastroenterology

## 2017-09-04 NOTE — Telephone Encounter (Signed)
The pt asked for help on how to use a fleet enema.  He was nervous about inserting applicator into the anus.  We discussed the appropriate way insert and advised him to hold in the liquid as long as he could.  He will use Vaseline to ease insertion.  He will call back with any further problems or concerns.

## 2017-09-08 ENCOUNTER — Telehealth: Payer: Self-pay

## 2017-09-08 NOTE — Telephone Encounter (Signed)
The pt states he is doing well. He completed an enema yesterday and had good results.  He will call if he has any further concerns or problems.

## 2017-09-08 NOTE — Telephone Encounter (Signed)
-----   Message from Timothy Lasso, RN sent at 08/25/2017  8:40 AM EDT -----   ----- Message ----- From: Loralie Champagne, PA-C Sent: 08/24/2017   5:04 PM To: Timothy Lasso, RN  I saw this patient in clinic today.  Can you please put in a reminder to call him back in 2 weeks to see how his fecal leakage is doing?  I forgot to have them put in his paperwork to call us back and I do not want him to forget.  Thank you,  Jess

## 2017-09-13 ENCOUNTER — Encounter: Payer: Medicare PPO | Admitting: Gastroenterology

## 2017-09-27 ENCOUNTER — Ambulatory Visit: Payer: Medicare PPO | Admitting: Podiatry

## 2017-10-05 ENCOUNTER — Telehealth: Payer: Self-pay | Admitting: Cardiology

## 2017-10-05 NOTE — Telephone Encounter (Signed)
Received records from [facility] on 10/05/17, Appt 01/18/18 @ 8:40AM. NV

## 2017-10-13 ENCOUNTER — Encounter: Payer: Self-pay | Admitting: Gastroenterology

## 2017-10-18 ENCOUNTER — Telehealth: Payer: Self-pay | Admitting: Emergency Medicine

## 2017-10-18 ENCOUNTER — Telehealth: Payer: Self-pay | Admitting: Gastroenterology

## 2017-10-18 NOTE — Telephone Encounter (Signed)
[facility] Pre-operative Risk Assessment     Request for surgical clearance:     Endoscopy Procedure  What type of surgery is being performed?     endoscopy  When is this surgery scheduled?     10-25-17  What type of clearance is required ?   Pharmacy  Are there any medications that need to be held prior to surgery and how long? Pradaxa 2 days   Practice name and name of physician performing surgery?      [facility]  What is your office phone and fax number?      Phone- [phone]  [phone]  Anesthesia type (None, local, MAC, general) ?       MAC

## 2017-10-18 NOTE — Telephone Encounter (Signed)
Pt on pradaxa, has egd scheduled on 10/25/17, he wants to know when he needs to stop bt. Ok to leave a detailed message on cell phone.

## 2017-10-19 NOTE — Telephone Encounter (Signed)
Spoke to patient and told him that since he has had procedures in the past at [place] Dr. Fuller Plan feels it is best to have this done at [facility] hospital. Patient verbalized understanding. Rescheduled to Dr. Silvio Pate next available hospital date 12/18/17. Pre-visit scheduled for patient. Letter mailed with date and time of pre-visit.

## 2017-10-20 ENCOUNTER — Ambulatory Visit: Payer: Medicare PPO | Admitting: Podiatry

## 2017-10-20 ENCOUNTER — Encounter: Payer: Self-pay | Admitting: Podiatry

## 2017-10-20 DIAGNOSIS — M79676 Pain in unspecified toe(s): Secondary | ICD-10-CM | POA: Diagnosis not present

## 2017-10-20 DIAGNOSIS — B351 Tinea unguium: Secondary | ICD-10-CM

## 2017-10-20 NOTE — Telephone Encounter (Signed)
   Primary Cardiologist:Peter Martinique, MD  Chart reviewed as part of pre-operative protocol coverage. Because of [name] NILE Jr.'s past medical history and time since last visit, he/she will require a follow-up visit in order to better assess preoperative cardiovascular risk.  Pre-op covering staff: - Please schedule appointment and call patient to inform them. - Please contact requesting surgeon's office via preferred method (i.e, phone, fax) to inform them of need for appointment prior to surgery.  Cecilie Kicks, NP  10/20/2017, 4:16 PM

## 2017-10-20 NOTE — Progress Notes (Signed)
Patient ID: [name]., male   DOB: 08-30-1940, 77 y.o.   MRN: [id number] Complaint:  Visit Type: Patient returns to my office for continued preventative foot care services. Complaint: Patient states" my nails have grown long and thick and become painful to walk and wear shoes". The patient presents for preventative foot care services. No changes to ROS.  Callus right foot is no longer painful.    Podiatric Exam: Vascular: dorsalis pedis and posterior tibial pulses are palpable bilateral. Capillary return is immediate. Temperature gradient is WNL. Skin turgor WNL  Sensorium: Normal Semmes Weinstein monofilament test. Normal tactile sensation bilaterally. Nail Exam: Pt has thick disfigured discolored nails with subungual debris noted bilateral entire nail hallux through fifth toenails Ulcer Exam: There is no evidence of ulcer or pre-ulcerative changes or infection. Orthopedic Exam: Muscle tone and strength are WNL. No limitations in general ROM. No crepitus or effusions noted. Foot type and digits show no abnormalities. Bony prominences are unremarkable. Skin:    Porokeratosis sub 3 right asymptomatic . No infection or ulcers  Diagnosis:  Onychomycosis, , Pain in right toe, pain in left toes   Treatment & Plan Procedures and Treatment: Consent by patient was obtained for treatment procedures. The patient understood the discussion of treatment and procedures well. All questions were answered thoroughly reviewed. Debridement of mycotic and hypertrophic toenails, 1 through 5 bilateral and clearing of subungual debris. No ulceration, no infection noted.   Return Visit-Office Procedure: Patient instructed to return to the office for a follow up visit 10 weeks  for continued evaluation and treatment.    Gardiner Barefoot DPM

## 2017-10-23 NOTE — Telephone Encounter (Signed)
Left a message to call back.

## 2017-10-24 NOTE — Telephone Encounter (Signed)
LM2CB-needs appt for clearance 

## 2017-10-25 ENCOUNTER — Encounter: Payer: Medicare PPO | Admitting: Gastroenterology

## 2017-10-25 NOTE — Telephone Encounter (Signed)
Patient contacted and appointment made with Doreene Adas, PA-C; patient agreeable with appointment and voiced understanding.

## 2017-11-02 NOTE — Progress Notes (Signed)
Cardiology Office Note:    Date:  11/03/2017   ID:  [name], DOB 1940-11-23, MRN [id number]  PCP:  Derinda Late, MD  Cardiologist:  Peter Martinique, MD   Referring MD: Derinda Late, MD   Chief Complaint  Patient presents with  . Medical Clearance    History of Present Illness:    Joseantonio Dittmar. is a 77 y.o. male with a hx of atrial fibrillation first diagnosed in 2012, initially paroxysmal, now persistent. He is maintained on lopressor, diltiazem, and pradaxa. He exercises vigorously. OSA on CPAP.  He last saw [name] on 01/09/2017.  He was doing well at that visit.  He returns today for cardiac clearance for an upcoming surgery - EGD. He is still on cardizem 180 mg, toprol 50 mg, pravastatin, and pradaxa for persistent Afib. He denies chest pain, shortness of breath, orthopnea, lower extremity swelling, DOE, and syncope. He reports problems with his bowels, we discuss a bowel regimen. He also asks about intermittent fasting.   Past Medical History:  Diagnosis Date  . Allergy   . Atrial fibrillation ([place])   . Barrett esophagus   . Barrett's esophagus   . BPH (benign prostatic hyperplasia)   . Chronic obstructive asthma   . Difficult intubation 13 or 14 years ago, at [facility] long   airway was up near nostrils with intubation per patient with bowel surgery  . ED (erectile dysfunction)   . GERD (gastroesophageal reflux disease)   . Hammer toe of right foot   . Hiatal hernia   . OSA (obstructive sleep apnea) 08/05/10 [facility]   AHI 24  . Paroxysmal atrial fibrillation ([place])    Hospitalized on 05/27/10  . Renal cell carcinoma 2009   right renal mass, followed by Dr. Ander Slade    Past Surgical History:  Procedure Laterality Date  . BASAL CELL CARCINOMA EXCISION    . ESOPHAGOGASTRODUODENOSCOPY (EGD) WITH PROPOFOL N/A 08/26/2014   Procedure: ESOPHAGOGASTRODUODENOSCOPY (EGD) WITH PROPOFOL;  Surgeon: [name], MD;  Location: [facility] ENDOSCOPY;  Service: Endoscopy;   Laterality: N/A;  . EXPLORATORY LAPAROTOMY W/ BOWEL RESECTION  12/1999   segmental sbr of a hemangioma, repair of incarcerated umbilical hernia  . exploratory laparotomy, segmental small bowel resection of a hemangioma, repair of incarcerated umbelical hernia  14/4818  . EYE SURGERY     cataracts done  . renal cancer surgery with RF Ablation    . TONSILLECTOMY      Current Medications: Current Meds  Medication Sig  . albuterol (PROVENTIL HFA;VENTOLIN HFA) 108 (90 BASE) MCG/ACT inhaler Inhale 2 puffs into the lungs every 4 (four) hours as needed for wheezing or shortness of breath (cough, shortness of breath or wheezing.).  Marland Kitchen alfuzosin (UROXATRAL) 10 MG 24 hr tablet Take 1 tablet by mouth daily.  . AMBULATORY NON FORMULARY MEDICATION CPAP at night  . AMBULATORY NON FORMULARY MEDICATION UDO's Oil 3-6-9 Blend Take 1 tbsp every day  . AMBULATORY NON FORMULARY MEDICATION Cardio Protegen Liquid 2 tbsp every morning  . AMBULATORY NON FORMULARY MEDICATION Inner Eco Liquid Take 1 tbsp every morning  . AMBULATORY NON FORMULARY MEDICATION Herbal Aloe Take 2 tbsp every morning  . AMBULATORY NON FORMULARY MEDICATION Carnivora  Take 1 tablet twice a day  . Apoaequorin (PREVAGEN PO) Take 1 tablet by mouth daily.   . beclomethasone (QVAR REDIHALER) 40 MCG/ACT inhaler Inhale 1 puff into the lungs 2 (two) times daily.  . dabigatran (PRADAXA) 150 MG CAPS capsule Take 1 capsule (  150 mg total) by mouth 2 (two) times daily.  Marland Kitchen diltiazem (CARTIA XT) 180 MG 24 hr capsule Take 1 capsule (180 mg total) by mouth daily.  Marland Kitchen dutasteride (AVODART) 0.5 MG capsule TK 1 C PO QD  . fluticasone (FLONASE) 50 MCG/ACT nasal spray INSTILL 2 SPRAYS IN EACH NOSTRIL EVERY DAY AS DIRECTED  . metoprolol succinate (TOPROL-XL) 50 MG 24 hr tablet TAKE 1 TABLET BY MOUTH EVERY DAY WITH OR FOLLOWING A MEAL  . montelukast (SINGULAIR) 10 MG tablet Take 1 tablet (10 mg total) by mouth at bedtime.  Marland Kitchen omeprazole (PRILOSEC) 20 MG capsule  TAKE ONE CAPSULE BY MOUTH EVERY DAY  . pravastatin (PRAVACHOL) 20 MG tablet Take 20 mg daily by mouth.  . tamsulosin (FLOMAX) 0.4 MG CAPS capsule Take 0.4 mg by mouth.  . [DISCONTINUED] fexofenadine-pseudoephedrine (ALLEGRA-D ALLERGY & CONGESTION) 60-120 MG 12 hr tablet Take 1 tablet by mouth 2 (two) times daily.     Allergies:   Tamsulosin and Proair hfa [albuterol]   Social History   Socioeconomic History  . Marital status: Divorced    Spouse name: Not on file  . Number of children: 4  . Years of education: Not on file  . Highest education level: Not on file  Occupational History  . Occupation: Engineer, manufacturing  . Occupation: Warehouse manager  Social Needs  . Financial resource strain: Not on file  . Food insecurity:    Worry: Not on file    Inability: Not on file  . Transportation needs:    Medical: Not on file    Non-medical: Not on file  Tobacco Use  . Smoking status: Former Smoker    Years: 10.00    Types: Cigars    Last attempt to quit: 02/29/1980    Years since quitting: 37.7  . Smokeless tobacco: Never Used  . Tobacco comment: quit when he was 77 years old  Substance and Sexual Activity  . Alcohol use: No  . Drug use: No  . Sexual activity: Not Currently  Lifestyle  . Physical activity:    Days per week: Not on file    Minutes per session: Not on file  . Stress: Not on file  Relationships  . Social connections:    Talks on phone: Not on file    Gets together: Not on file    Attends religious service: Not on file    Active member of club or organization: Not on file    Attends meetings of clubs or organizations: Not on file    Relationship status: Not on file  Other Topics Concern  . Not on file  Social History Narrative   ** Merged History Encounter ** Married. Education: [employer]. Exercise: Eleptical times a week for 45 minutes.            Family History: The patient's family history includes Asthma in his paternal grandmother; Diabetes in  his brother, father, and mother; Heart attack in his paternal grandfather; Kidney cancer in his paternal aunt; Obesity in his brother; Stroke in his mother. There is no history of Colon cancer.  ROS:   Please see the history of present illness.     All other systems reviewed and are negative.  EKGs/Labs/Other Studies Reviewed:    The following studies were reviewed today:   EKG:  EKG is ordered today.  The ekg ordered today demonstrates sinus  Recent Labs: 06/20/2017: BUN 17; Creatinine, Ser 1.21; Potassium 4.2; Sodium 139  Recent Lipid Panel  Component Value Date/Time   CHOL 179 11/19/2014 0820   TRIG 67 11/19/2014 0820   HDL 54 11/19/2014 0820   CHOLHDL 3.3 11/19/2014 0820   VLDL 13 11/19/2014 0820   LDLCALC 112 11/19/2014 0820    Physical Exam:    VS:  BP 130/87   Pulse 70   Ht 5\' 10"  (1.778 m)   Wt 189 lb (85.7 kg)   BMI 27.12 kg/m     Wt Readings from Last 3 Encounters:  11/03/17 189 lb (85.7 kg)  08/24/17 191 lb (86.6 kg)  08/18/17 191 lb 3.2 oz (86.7 kg)     GEN:  Well nourished, well developed in no acute distress HEENT: Normal NECK: No JVD; No carotid bruits LYMPHATICS: No lymphadenopathy CARDIAC: RRR, no murmurs, rubs, gallops RESPIRATORY:  Clear to auscultation without rales, wheezing or rhonchi  ABDOMEN: Soft, non-tender, non-distended MUSCULOSKELETAL:  No edema; No deformity  SKIN: Warm and dry NEUROLOGIC:  Alert and oriented x 3 PSYCHIATRIC:  Normal affect   ASSESSMENT:    1. Chronic atrial fibrillation ([place])   2. Hyperlipidemia, unspecified hyperlipidemia type   3. Preoperative clearance    PLAN:    In order of problems listed above:  Chronic atrial fibrillation ([place]) - Plan: EKG 12-Lead Stable. No palpitations. No bleeding on pradaxa. Continue present medications.  Hyperlipidemia, unspecified hyperlipidemia type - Plan: Lipid panel, Hepatic function panel Will check fasting lipid profile.   Preoperative clearance He can complete  more than 4.0 METS, is not on insulin and has normal renal function. He does not need any further cardiac workup and is cleared for EGD.   Per pharmacists: Per chart review, Mr. Boyadjian may hold Pradaxa 2 days prior to procedure.   Patient with diagnosis of Afib on Pradaxa for anticoagulation.    Procedure: EGD   CHADS2-VASc score of 2 (AGEx2)   CrCl 51.1 mL/min (IBW)   Per office protocol, patient can hold Pradaxa for 2 days prior to procedure. Should restart Pradaxa on the evening of procedure or day after, at discretion of procedure MD   I will fax this note to the requesting provider.    Medication Adjustments/Labs and Tests Ordered: Current medicines are reviewed at length with the patient today.  Concerns regarding medicines are outlined above.  Orders Placed This Encounter  Procedures  . Lipid panel  . Hepatic function panel  . EKG 12-Lead   No orders of the defined types were placed in this encounter.   Signed, Ledora Bottcher, PA  11/03/2017 11:36 AM    [facility]

## 2017-11-03 ENCOUNTER — Ambulatory Visit: Payer: Medicare PPO | Admitting: Physician Assistant

## 2017-11-03 ENCOUNTER — Encounter: Payer: Self-pay | Admitting: Physician Assistant

## 2017-11-03 VITALS — BP 130/87 | HR 70 | Ht 70.0 in | Wt 189.0 lb

## 2017-11-03 DIAGNOSIS — I482 Chronic atrial fibrillation, unspecified: Secondary | ICD-10-CM

## 2017-11-03 DIAGNOSIS — Z01818 Encounter for other preprocedural examination: Secondary | ICD-10-CM | POA: Diagnosis not present

## 2017-11-03 DIAGNOSIS — E785 Hyperlipidemia, unspecified: Secondary | ICD-10-CM | POA: Diagnosis not present

## 2017-11-03 NOTE — Patient Instructions (Signed)
Medication Instructions: Your physician recommends that you continue on your current medications as directed. Please refer to the Current Medication list given to you today.  Labwork: Your physician recommends that you return for a FASTING lipid profile and hepatic function panel at your earliest convenience.    Follow-Up: Your physician wants you to follow-up in: 1 year with [name]. You will receive a reminder letter in the mail two months in advance. If you don't receive a letter, please call our office to schedule the follow-up appointment.  If you need a refill on your cardiac medications before your next appointment, please call your pharmacy.

## 2017-11-08 LAB — HEPATIC FUNCTION PANEL
ALBUMIN: 4 g/dL (ref 3.5–4.8)
ALT: 9 IU/L (ref 0–44)
AST: 17 IU/L (ref 0–40)
Alkaline Phosphatase: 68 IU/L (ref 39–117)
Bilirubin Total: 0.5 mg/dL (ref 0.0–1.2)
Bilirubin, Direct: 0.17 mg/dL (ref 0.00–0.40)
TOTAL PROTEIN: 7.3 g/dL (ref 6.0–8.5)

## 2017-11-08 LAB — LIPID PANEL
Chol/HDL Ratio: 2.5 ratio (ref 0.0–5.0)
Cholesterol, Total: 124 mg/dL (ref 100–199)
HDL: 49 mg/dL (ref >39)
LDL Calculated: 64 mg/dL (ref 0–99)
Triglycerides: 56 mg/dL (ref 0–149)
VLDL CHOLESTEROL CAL: 11 mg/dL (ref 5–40)

## 2017-11-23 ENCOUNTER — Telehealth: Payer: Self-pay | Admitting: Gastroenterology

## 2017-11-23 NOTE — Telephone Encounter (Signed)
Spoke to patient, he was unable to use the Fleets enemas last time to help get himself cleaned out. He did use magnesium citrate and he states that worked well. It did seem to help for several days afterwards, then he gradually started having the fecal incontinence again. I did let him know that Dr. Fuller Plan is out of the office and will return on Monday 9/30. In the meantime, suggested the magnesium citrate again, since that seemed to work well for him. I would let Dr. Fuller Plan know and his nurse would get back with him with his recommendations. Patient is scheduled at [facility] on 10/21 for an EGD.

## 2017-11-27 ENCOUNTER — Telehealth: Payer: Self-pay | Admitting: Gastroenterology

## 2017-11-27 NOTE — Telephone Encounter (Signed)
Patient states he is still having gi symptoms and wants to know if he can have colon the same day as his endo on 10.21.19. Patient requesting to speak with nurse. See phone note from 9.26.19 as well.

## 2017-11-27 NOTE — Telephone Encounter (Signed)
Begin Miralax daily to help to prevent constipation.

## 2017-11-27 NOTE — Telephone Encounter (Signed)
Left detailed message for patient to use Miralax daily to help prevent constipation, may titrate dosage up if not effective. Call the office if he has questions or concerns.

## 2017-11-27 NOTE — Telephone Encounter (Signed)
Left message for patient to call back  

## 2017-11-27 NOTE — Telephone Encounter (Signed)
His last colonoscopy was normal so it is not due for screening until 2021. If he is requesting colonoscopy for further evaluation of his constipation, fecal leakage we can proceed however it is not always necessary for constipation, fecal leakage. The fecal leakage is likely overflow from inadequately treated constipation. We sent advice on mgmt of constipation and overflow today. If he wants to proceed we need a day when we can do both together or he can come on separate days with Pradaxa held before each procedure day.

## 2017-11-27 NOTE — Telephone Encounter (Signed)
Patient is requesting colonoscopy be added to his EGD.  Dr. Fuller Plan please advise

## 2017-11-28 NOTE — Telephone Encounter (Signed)
Patient notified of recommendations.  He would like to wait on the colonoscopy for now.  We discussed the Miralax recommendations. All questions answered he will call back for additional questions or concerns.

## 2017-12-12 ENCOUNTER — Ambulatory Visit (AMBULATORY_SURGERY_CENTER): Payer: Self-pay

## 2017-12-12 VITALS — Ht 70.0 in | Wt 192.4 lb

## 2017-12-12 DIAGNOSIS — K2270 Barrett's esophagus without dysplasia: Secondary | ICD-10-CM

## 2017-12-12 NOTE — Progress Notes (Signed)
Denies allergies to eggs or soy products. Denies complication of anesthesia or sedation. Denies use of weight loss medication. Denies use of O2.   Emmi instructions declined.  

## 2017-12-18 ENCOUNTER — Ambulatory Visit (HOSPITAL_COMMUNITY): Payer: Medicare PPO | Admitting: Certified Registered Nurse Anesthetist

## 2017-12-18 ENCOUNTER — Encounter (HOSPITAL_COMMUNITY)
Admission: RE | Disposition: A | Discharge status: Home / Self Care | Payer: Self-pay | Source: Ambulatory Visit | Attending: Gastroenterology

## 2017-12-18 ENCOUNTER — Ambulatory Visit (HOSPITAL_COMMUNITY)
Admission: RE | Admit: 2017-12-18 | Discharge: 2017-12-18 | Disposition: A | Discharge status: Home / Self Care | Payer: Medicare PPO | Source: Ambulatory Visit | Attending: Gastroenterology | Admitting: Gastroenterology

## 2017-12-18 ENCOUNTER — Encounter (HOSPITAL_COMMUNITY): Payer: Self-pay | Admitting: *Deleted

## 2017-12-18 ENCOUNTER — Other Ambulatory Visit: Payer: Self-pay

## 2017-12-18 DIAGNOSIS — Z7901 Long term (current) use of anticoagulants: Secondary | ICD-10-CM | POA: Insufficient documentation

## 2017-12-18 DIAGNOSIS — Z85828 Personal history of other malignant neoplasm of skin: Secondary | ICD-10-CM | POA: Diagnosis not present

## 2017-12-18 DIAGNOSIS — K2270 Barrett's esophagus without dysplasia: Secondary | ICD-10-CM | POA: Insufficient documentation

## 2017-12-18 DIAGNOSIS — Z87891 Personal history of nicotine dependence: Secondary | ICD-10-CM | POA: Insufficient documentation

## 2017-12-18 DIAGNOSIS — I480 Paroxysmal atrial fibrillation: Secondary | ICD-10-CM | POA: Diagnosis not present

## 2017-12-18 DIAGNOSIS — Z888 Allergy status to other drugs, medicaments and biological substances status: Secondary | ICD-10-CM | POA: Diagnosis not present

## 2017-12-18 DIAGNOSIS — K317 Polyp of stomach and duodenum: Secondary | ICD-10-CM

## 2017-12-18 DIAGNOSIS — Z79899 Other long term (current) drug therapy: Secondary | ICD-10-CM | POA: Diagnosis not present

## 2017-12-18 DIAGNOSIS — J449 Chronic obstructive pulmonary disease, unspecified: Secondary | ICD-10-CM | POA: Diagnosis not present

## 2017-12-18 DIAGNOSIS — G4733 Obstructive sleep apnea (adult) (pediatric): Secondary | ICD-10-CM | POA: Insufficient documentation

## 2017-12-18 DIAGNOSIS — K219 Gastro-esophageal reflux disease without esophagitis: Secondary | ICD-10-CM | POA: Diagnosis not present

## 2017-12-18 DIAGNOSIS — K449 Diaphragmatic hernia without obstruction or gangrene: Secondary | ICD-10-CM | POA: Diagnosis not present

## 2017-12-18 DIAGNOSIS — N400 Enlarged prostate without lower urinary tract symptoms: Secondary | ICD-10-CM | POA: Diagnosis not present

## 2017-12-18 DIAGNOSIS — K2970 Gastritis, unspecified, without bleeding: Secondary | ICD-10-CM

## 2017-12-18 HISTORY — PX: ESOPHAGOGASTRODUODENOSCOPY (EGD) WITH PROPOFOL: SHX5813

## 2017-12-18 HISTORY — PX: BIOPSY: SHX5522

## 2017-12-18 SURGERY — ESOPHAGOGASTRODUODENOSCOPY (EGD) WITH PROPOFOL
Anesthesia: Monitor Anesthesia Care

## 2017-12-18 MED ORDER — LIDOCAINE 2% (20 MG/ML) 5 ML SYRINGE
INTRAMUSCULAR | Status: DC | PRN
  Administered 2017-12-18: 100 mg via INTRAVENOUS

## 2017-12-18 MED ORDER — PROPOFOL 500 MG/50ML IV EMUL
INTRAVENOUS | Status: DC | PRN
  Administered 2017-12-18: 75 ug/kg/min via INTRAVENOUS

## 2017-12-18 MED ORDER — PROPOFOL 10 MG/ML IV BOLUS
INTRAVENOUS | Status: DC | PRN
  Administered 2017-12-18 (×3): 10 mg via INTRAVENOUS
  Administered 2017-12-18: 20 mg via INTRAVENOUS
  Administered 2017-12-18: 10 mg via INTRAVENOUS
  Administered 2017-12-18: 20 mg via INTRAVENOUS
  Administered 2017-12-18: 10 mg via INTRAVENOUS

## 2017-12-18 MED ORDER — PROPOFOL 10 MG/ML IV BOLUS
INTRAVENOUS | Status: AC
  Filled 2017-12-18: qty 60

## 2017-12-18 MED ORDER — LACTATED RINGERS IV SOLN
INTRAVENOUS | Status: DC
  Administered 2017-12-18: 08:00:00 via INTRAVENOUS

## 2017-12-18 SURGICAL SUPPLY — 15 items

## 2017-12-18 NOTE — Discharge Instructions (Signed)
YOU HAD AN ENDOSCOPIC PROCEDURE TODAY: Refer to the procedure report and other information in the discharge instructions given to you for any specific questions about what was found during the examination. If this information does not answer your questions, please call [facility] office at [phone] to clarify.   YOU SHOULD EXPECT: Some feelings of bloating in the abdomen. Passage of more gas than usual. Walking can help get rid of the air that was put into your GI tract during the procedure and reduce the bloating. If you had a lower endoscopy (such as a colonoscopy or flexible sigmoidoscopy) you may notice spotting of blood in your stool or on the toilet paper. Some abdominal soreness may be present for a day or two, also.  DIET: Your first meal following the procedure should be a light meal and then it is ok to progress to your normal diet. A half-sandwich or bowl of soup is an example of a good first meal. Heavy or fried foods are harder to digest and may make you feel nauseous or bloated. Drink plenty of fluids but you should avoid alcoholic beverages for 24 hours. If you had a esophageal dilation, please see attached instructions for diet.    ACTIVITY: Your care partner should take you home directly after the procedure. You should plan to take it easy, moving slowly for the rest of the day. You can resume normal activity the day after the procedure however YOU SHOULD NOT DRIVE, use power tools, machinery or perform tasks that involve climbing or major physical exertion for 24 hours (because of the sedation medicines used during the test).   SYMPTOMS TO REPORT IMMEDIATELY: A gastroenterologist can be reached at any hour. Please call [phone]  for any of the following symptoms:   Following upper endoscopy (EGD, EUS, ERCP, esophageal dilation) Vomiting of blood or coffee ground material  New, significant abdominal pain  New, significant chest pain or pain under the shoulder blades  Painful or  persistently difficult swallowing  New shortness of breath  Black, tarry-looking or red, bloody stools  FOLLOW UP:  If any biopsies were taken you will be contacted by phone or by letter within the next 1-3 weeks. Call [phone]  if you have not heard about the biopsies in 3 weeks.  Please also call with any specific questions about appointments or follow up tests.  

## 2017-12-18 NOTE — Op Note (Addendum)
[facility] Patient Name: [name] Procedure Date: 12/18/2017 MRN: [id number] Attending MD: [name] , MD Date of Birth: 12/22/1940 CSN: [id number] Age: 77 Admit Type: Outpatient Procedure:                Upper GI endoscopy Indications:              Surveillance for malignancy due to personal history                            of Barrett's esophagus Providers:                Pricilla Riffle. Fuller Plan, MD, Angus Seller, Tinnie Gens,                            Technician, Adair Laundry, CRNA Referring MD:             Derinda Late, MD Medicines:                Monitored Anesthesia Care Complications:            No immediate complications. Estimated blood loss:                            None. Estimated Blood Loss:     Estimated blood loss was minimal. Procedure:                Pre-Anesthesia Assessment:                           - Prior to the procedure, a History and Physical                            was performed, and patient medications and                            allergies were reviewed. The patient's tolerance of                            previous anesthesia was also reviewed. The risks                            and benefits of the procedure and the sedation                            options and risks were discussed with the patient.                            All questions were answered, and informed consent                            was obtained. Prior Anticoagulants: The patient has                            taken Pradaxa (dabigatran), last dose was 2 days  prior to procedure. ASA Grade Assessment: III - A                            patient with severe systemic disease. After                            reviewing the risks and benefits, the patient was                            deemed in satisfactory condition to undergo the                            procedure.                           After obtaining informed consent,  the endoscope was                            passed under direct vision. Throughout the                            procedure, the patient's blood pressure, pulse, and                            oxygen saturations were monitored continuously. The                            GIF-H190 ([id number]) Olympus adult endoscope was                            introduced through the mouth, and advanced to the                            second part of duodenum. The upper GI endoscopy was                            accomplished without difficulty. The patient                            tolerated the procedure well. Scope In: Scope Out: Findings:      There were esophageal mucosal changes secondary to established       short-segment Barrett's disease present in the distal esophagus. The       maximum longitudinal extent of these mucosal changes was 2 cm in length.       Mucosa was biopsied with a cold forceps for histology in 4 quadrants at       intervals of 1 cm in the lower third of the esophagus. One specimen       bottle was sent to pathology.      The exam of the esophagus was otherwise normal.      A single 4 mm sessile polyp with no bleeding and no stigmata of recent       bleeding was found in the gastric body. Biopsies were taken with a cold       forceps for histology.  A small hiatal hernia was present.      The exam of the stomach was otherwise normal.      The duodenal bulb and second portion of the duodenum were normal. Impression:               - Esophageal mucosal changes secondary to                            established short-segment Barrett's disease.                            Biopsied.                           - A single gastric polyp. Biopsied.                           - Small hiatal hernia.                           - Normal duodenal bulb and second portion of the                            duodenum. Moderate Sedation:      N/A- Per Anesthesia Care Recommendation:            - Patient has a contact number available for                            emergencies. The signs and symptoms of potential                            delayed complications were discussed with the                            patient. Return to normal activities tomorrow.                            Written discharge instructions were provided to the                            patient.                           - Resume previous diet.                           - Continue present medications.                           - Await pathology results.                           - Resume Pradaxa (dabigatran) at prior dose                            tomorrow. Refer to managing physician for further  adjustment of therapy.                           - Benefiber once daily.                           - Miralax 1 scoop daily as directed. Procedure Code(s):        --- Professional ---                           [phone], Esophagogastroduodenoscopy, flexible,                            transoral; with biopsy, single or multiple Diagnosis Code(s):        --- Professional ---                           K22.70, Barrett's esophagus without dysplasia                           K31.7, Polyp of stomach and duodenum                           K44.9, Diaphragmatic hernia without obstruction or                            gangrene CPT copyright 2018 [employer] Association. All rights reserved. The codes documented in this report are preliminary and upon coder review may  be revised to meet current compliance requirements. [name], MD 12/18/2017 9:09:58 AM This report has been signed electronically. Number of Addenda: 0

## 2017-12-18 NOTE — Anesthesia Postprocedure Evaluation (Signed)
Anesthesia Post Note  Patient: [name].  Procedure(s) Performed: ESOPHAGOGASTRODUODENOSCOPY (EGD) WITH PROPOFOL (N/A ) BIOPSY     Patient location during evaluation: PACU Anesthesia Type: MAC Level of consciousness: awake and alert Pain management: pain level controlled Vital Signs Assessment: post-procedure vital signs reviewed and stable Respiratory status: spontaneous breathing, nonlabored ventilation, respiratory function stable and patient connected to nasal cannula oxygen Cardiovascular status: stable and blood pressure returned to baseline Postop Assessment: no apparent nausea or vomiting Anesthetic complications: no    Last Vitals:  Vitals:   12/18/17 0915 12/18/17 0925  BP: 134/77 136/68  Pulse: 69 72  Resp: 14 16  Temp:    SpO2: 99% 100%    Last Pain:  Vitals:   12/18/17 0925  TempSrc:   PainSc: 0-No pain                 Effie Berkshire

## 2017-12-18 NOTE — H&P (Signed)
HISTORY OF PRESENT ILLNESS:  This is a 77 year old male who is a patient of Dr. Lynne Leader.  He is here today with complaints of fecal leakage.  Says that this has been occurring for the past week.  Never had anything similar to this in the past.  Moving his bowels without issues, but leaking stool in between BM's without his knowledge.  Having to wear pads to catch the stool.  Has been using recticare that was given to him at the [facility] for perianal irritation.  Trace of light colored blood on the pad at times from the irritation, no blood in the stool itself.  Has Barrett's esophagus and is due for recall EGD.  Wants to schedule that as well while he is here.  Is on Pradaxa for atrial fibrillation, which he has been on for several years.  prescrbied by [name].       Past Medical History:  Diagnosis Date  . Allergy   . Atrial fibrillation ([place])   . Barrett esophagus   . Barrett's esophagus   . BPH (benign prostatic hyperplasia)   . Chronic obstructive asthma   . Difficult intubation 13 or 14 years ago, at [facility] long   airway was up near nostrils with intubation per patient with bowel surgery  . ED (erectile dysfunction)   . GERD (gastroesophageal reflux disease)   . Hammer toe of right foot   . Hiatal hernia   . OSA (obstructive sleep apnea) 08/05/10 [facility]   AHI 24  . Paroxysmal atrial fibrillation ([place])    Hospitalized on 05/27/10  . Renal cell carcinoma 2009   right renal mass, followed by Dr. Ander Slade        Past Surgical History:  Procedure Laterality Date  . BASAL CELL CARCINOMA EXCISION    . ESOPHAGOGASTRODUODENOSCOPY (EGD) WITH PROPOFOL N/A 08/26/2014   Procedure: ESOPHAGOGASTRODUODENOSCOPY (EGD) WITH PROPOFOL;  Surgeon: [name], MD;  Location: [facility] ENDOSCOPY;  Service: Endoscopy;  Laterality: N/A;  . EXPLORATORY LAPAROTOMY W/ BOWEL RESECTION  12/1999   segmental sbr of a hemangioma, repair of incarcerated umbilical hernia  . exploratory laparotomy,  segmental small bowel resection of a hemangioma, repair of incarcerated umbelical hernia  10/3233  . EYE SURGERY     cataracts done  . renal cancer surgery with RF Ablation    . TONSILLECTOMY      reports that he quit smoking about 37 years ago. His smoking use included cigars. He quit after 10.00 years of use. He has never used smokeless tobacco. He reports that he does not drink alcohol or use drugs. family history includes Asthma in his paternal grandmother; Diabetes in his brother, father, and mother; Heart attack in his paternal grandfather; Kidney cancer in his paternal aunt; Obesity in his brother; Stroke in his mother.      Allergies  Allergen Reactions  . Tamsulosin Other (See Comments)    dizziness  . Proair Hfa [Albuterol] Palpitations    Only use in emergency due to has heart problems          Outpatient Encounter Medications as of 08/24/2017  Medication Sig  . albuterol (PROVENTIL HFA;VENTOLIN HFA) 108 (90 BASE) MCG/ACT inhaler Inhale 2 puffs into the lungs every 4 (four) hours as needed for wheezing or shortness of breath (cough, shortness of breath or wheezing.).  Marland Kitchen alfuzosin (UROXATRAL) 10 MG 24 hr tablet Take 1 tablet by mouth daily.  . AMBULATORY NON FORMULARY MEDICATION CPAP at night  . AMBULATORY NON FORMULARY  MEDICATION UDO's Oil 3-6-9 Blend Take 1 tbsp every day  . AMBULATORY NON FORMULARY MEDICATION Cardio Protegen Liquid 2 tbsp every morning  . AMBULATORY NON FORMULARY MEDICATION Inner Eco Liquid Take 1 tbsp every morning  . AMBULATORY NON FORMULARY MEDICATION Herbal Aloe Take 2 tbsp every morning  . AMBULATORY NON FORMULARY MEDICATION Carnivora  Take 1 tablet twice a day  . Apoaequorin (PREVAGEN PO) Take 1 tablet by mouth daily.   . beclomethasone (QVAR REDIHALER) 40 MCG/ACT inhaler Inhale 1 puff into the lungs 2 (two) times daily.  . cetirizine (ZYRTEC) 10 MG tablet Take 10 mg by mouth as needed for allergies.   Marland Kitchen dabigatran (PRADAXA) 150  MG CAPS capsule Take 1 capsule (150 mg total) by mouth 2 (two) times daily.  Marland Kitchen diltiazem (CARTIA XT) 180 MG 24 hr capsule Take 1 capsule (180 mg total) by mouth daily.  Marland Kitchen dutasteride (AVODART) 0.5 MG capsule TK 1 C PO QD  . fexofenadine-pseudoephedrine (ALLEGRA-D ALLERGY & CONGESTION) 60-120 MG 12 hr tablet Take 1 tablet by mouth 2 (two) times daily.  . fluticasone (FLONASE) 50 MCG/ACT nasal spray INSTILL 2 SPRAYS IN EACH NOSTRIL EVERY DAY AS DIRECTED  . Lidocaine, Anorectal, 5 % GEL Apply 1 application topically 3 (three) times daily. Apply a pea-sized amount to the affected area 3 times daily.  . metoprolol succinate (TOPROL-XL) 50 MG 24 hr tablet TAKE 1 TABLET BY MOUTH EVERY DAY WITH OR FOLLOWING A MEAL  . montelukast (SINGULAIR) 10 MG tablet Take 1 tablet (10 mg total) by mouth at bedtime.  Marland Kitchen omeprazole (PRILOSEC) 20 MG capsule TAKE ONE CAPSULE BY MOUTH EVERY DAY  . oxymetazoline (AFRIN NASAL SPRAY) 0.05 % nasal spray Place 1 spray into both nostrils 2 (two) times daily.  . pravastatin (PRAVACHOL) 20 MG tablet Take 20 mg daily by mouth.  . tamsulosin (FLOMAX) 0.4 MG CAPS capsule Take 0.4 mg by mouth.  . Vitamin E-Tocotrienols (MAXILIFE RICE TOCOTRIENOLS) 50-26 UNIT-MG CAPS Take by mouth.  . zolpidem (AMBIEN) 5 MG tablet Take 0.5-1 tablets (2.5-5 mg total) by mouth at bedtime as needed. For anxiety/insomnia   No facility-administered encounter medications on file as of 08/24/2017.      REVIEW OF SYSTEMS  : All other systems reviewed and negative except where noted in the History of Present Illness.   PHYSICAL EXAM: BP 118/78   Pulse 82   Ht 5\' 10"  (1.778 m)   Wt 191 lb (86.6 kg)   BMI 27.41 kg/m  General: Well developed white male in no acute distress Head: Normocephalic and atraumatic Eyes:  Sclerae anicteric, conjunctiva pink. Ears: Normal auditory acuity Lungs: Clear throughout to auscultation; no increased WOB. Heart: Regular rate and rhythm; no M/R/G. Abdomen: Soft,  non-distended.  BS present.  Non-tender. Rectal:  A lot of soft stool externally in perianal area with skin irritation.  DRE revealed a lot of soft stool in rectum. Musculoskeletal: Symmetrical with no gross deformities  Skin: No lesions on visible extremities Extremities: No edema  Neurological: Alert oriented x 4, grossly non-focal. Psychological:  Alert and cooperative. Normal mood and affect  ASSESSMENT AND PLAN: *Fecal leakage:  Has only been present for the past week.  Has a lot of soft stool in his rectum.  Suspect he is not emptying well.  Sphincter tone ok.  Benefiber or Citrucel daily. Miralax daily.  *Perianal irritation:  Likely from stool soilage.  Will apply Desitin to affected area multiple times per day and particularly at night. *Barrett's esophagus:  Due for repeat EGD.  Will schedule with Dr. Fuller Plan. The risks (including bleeding, perforation, infection, missed lesions, medication reactions and possible hospitalization or surgery if complications occur), benefits, and alternatives to endoscopy with possible biopsy and possible dilation were discussed with the patient and they consent to proceed.  *Chronic anticoagulation with Pradaxa due to atrial fibrillation:  Will hold Pradaxa for 2 days prior to endoscopic procedures - will instruct when and how to resume after procedure. Benefits and risks of procedure explained including risks of bleeding, perforation, infection, missed lesions, reactions to medications and possible need for hospitalization and surgery for complications. Additional rare but real risk of stroke or other vascular clotting events off of Pradaxa also explained and need to seek urgent help if any signs of these problems occur. Will communicate by phone or EMR with patient's prescribing provider, [name], to confirm that holding Pradaxa is reasonable in this case.

## 2017-12-18 NOTE — Anesthesia Preprocedure Evaluation (Addendum)
Anesthesia Evaluation  Patient identified by MRN, date of birth, ID band Patient awake    Reviewed: Allergy & Precautions, NPO status , Patient's Chart, lab work & pertinent test results, reviewed documented beta blocker date and time   Airway Mallampati: III  TM Distance: >3 FB Neck ROM: Full  Mouth opening: Limited Mouth Opening  Dental  (+) Teeth Intact, Dental Advisory Given   Pulmonary asthma , sleep apnea , COPD, former smoker,    breath sounds clear to auscultation       Cardiovascular + dysrhythmias Atrial Fibrillation  Rhythm:Irregular Rate:Normal     Neuro/Psych negative neurological ROS     GI/Hepatic hiatal hernia, GERD  Medicated,  Endo/Other    Renal/GU      Musculoskeletal   Abdominal Normal abdominal exam  (+)   Peds  Hematology   Anesthesia Other Findings   Reproductive/Obstetrics                           Anesthesia Physical Anesthesia Plan  ASA: III  Anesthesia Plan: MAC   Post-op Pain Management:    Induction: Intravenous  PONV Risk Score and Plan: 1 and Propofol infusion and Ondansetron  Airway Management Planned: Nasal Cannula  Additional Equipment: None  Intra-op Plan:   Post-operative Plan:   Informed Consent: I have reviewed the patients History and Physical, chart, labs and discussed the procedure including the risks, benefits and alternatives for the proposed anesthesia with the patient or authorized representative who has indicated his/her understanding and acceptance.     Plan Discussed with: CRNA  Anesthesia Plan Comments:        Anesthesia Quick Evaluation

## 2017-12-18 NOTE — Transfer of Care (Signed)
Immediate Anesthesia Transfer of Care Note  Patient: [name].  Procedure(s) Performed: ESOPHAGOGASTRODUODENOSCOPY (EGD) WITH PROPOFOL (N/A ) BIOPSY  Patient Location: Endoscopy Unit  Anesthesia Type:MAC  Level of Consciousness: drowsy  Airway & Oxygen Therapy: Patient Spontanous Breathing and Patient connected to nasal cannula oxygen  Post-op Assessment: Report given to RN and Post -op Vital signs reviewed and stable  Post vital signs: Reviewed and stable  Last Vitals:  Vitals Value Taken Time  BP    Temp    Pulse 72 12/18/2017  9:06 AM  Resp 15 12/18/2017  9:06 AM  SpO2 99 % 12/18/2017  9:06 AM  Vitals shown include unvalidated device data.  Last Pain:  Vitals:   12/18/17 0800  TempSrc: Oral  PainSc: 0-No pain         Complications: No apparent anesthesia complications

## 2017-12-20 ENCOUNTER — Encounter (HOSPITAL_COMMUNITY): Payer: Self-pay | Admitting: Gastroenterology

## 2017-12-25 ENCOUNTER — Encounter: Payer: Self-pay | Admitting: Gastroenterology

## 2017-12-28 ENCOUNTER — Other Ambulatory Visit: Payer: Self-pay | Admitting: Pulmonary Disease

## 2018-01-16 NOTE — Progress Notes (Signed)
[name]. Date of Birth: Aug 12, 1940 Medical Record #[id number]  History of Present Illness: [name] is seen for follow up Afib.He has a history of atrial fibrillation dating back to 2012. Initially this was paroxysmal but multiple Ecgs since then show persistent Afib with controlled rate. He is managed with metoprolol, diltiazem, and pradaxa.   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 Sees Urology for BPH. Had abdominal US early this year showing no aneurysm. Carotid dopplers showed no significant disease < 1-39%    He works out vigorously every other day for one hour at [name]. He denies any chest pain, SOB, or palpitations. Pulse is irregular but he is completely asymptomatic. No dizziness or lightheadedness.  He still uses CPAP.   Allergies as of 01/18/2018      Reactions   Tamsulosin Other (See Comments)   dizziness   Proair Hfa [albuterol] Palpitations   Only use in emergency due to has heart problems      Medication List        Accurate as of 01/18/18  8:59 AM. Always use your most recent med list.          albuterol 108 (90 Base) MCG/ACT inhaler Commonly known as:  PROVENTIL HFA;VENTOLIN HFA Inhale 2 puffs into the lungs every 4 (four) hours as needed for wheezing or shortness of breath (cough, shortness of breath or wheezing.).   alfuzosin 10 MG 24 hr tablet Commonly known as:  UROXATRAL Take 1 tablet by mouth daily.   AMBULATORY NON FORMULARY MEDICATION CPAP at night   AMBULATORY NON FORMULARY MEDICATION UDO's Oil 3-6-9 Blend Take 1 tbsp every day   AMBULATORY NON FORMULARY MEDICATION Cardio Protegen Liquid 2 tbsp every morning   AMBULATORY NON FORMULARY MEDICATION Carnivora  Take 1 tablet twice a day   dabigatran 150 MG Caps capsule Commonly known as:  PRADAXA Take 1 capsule (150 mg total) by mouth 2 (two) times daily.   diltiazem 180 MG 24 hr capsule Commonly known as:  CARDIZEM CD Take 1 capsule (180 mg total) by  mouth daily.   dutasteride 0.5 MG capsule Commonly known as:  AVODART Take 0.5 mg by mouth daily.   fluticasone 50 MCG/ACT nasal spray Commonly known as:  FLONASE INSTILL 2 SPRAYS IN EACH NOSTRIL EVERY DAY AS DIRECTED   metoprolol succinate 50 MG 24 hr tablet Commonly known as:  TOPROL-XL TAKE 1 TABLET BY MOUTH EVERY DAY WITH OR FOLLOWING A MEAL   MIRALAX PO Take by mouth.   montelukast 10 MG tablet Commonly known as:  SINGULAIR TAKE 1 TABLET(10 MG) BY MOUTH AT BEDTIME   omeprazole 20 MG capsule Commonly known as:  PRILOSEC TAKE ONE CAPSULE BY MOUTH EVERY DAY   OVER THE COUNTER MEDICATION Relaxium Sleep, One capsule before bedtime.   OVER THE COUNTER MEDICATION Super Beta Prostate, one capsule daily.   OVER THE COUNTER MEDICATION Take 1 capsule by mouth every other day. Tocotrienol   pravastatin 20 MG tablet Commonly known as:  PRAVACHOL Take 20 mg daily by mouth.   PREVAGEN PO Take 1 tablet by mouth daily.        Allergies  Allergen Reactions  . Tamsulosin Other (See Comments)    dizziness  . Proair Hfa [Albuterol] Palpitations    Only use in emergency due to has heart problems    Past Medical History:  Diagnosis Date  . Allergy   . Arrhythmia   . Atrial fibrillation ([name])   . Barrett  esophagus   . Barrett's esophagus   . Blood transfusion without reported diagnosis   . BPH (benign prostatic hyperplasia)   . Cervical stenosis of spine   . Chronic obstructive asthma   . Difficult intubation 13 or 14 years ago, at [name]   airway was up near nostrils with intubation per patient with bowel surgery  . ED (erectile dysfunction)   . GERD (gastroesophageal reflux disease)   . Hammer toe of right foot   . Hiatal hernia   . Hyperlipidemia   . Hypertension   . OSA (obstructive sleep apnea) 08/05/10 [facility]   AHI 24  . Paroxysmal atrial fibrillation ([place])    Hospitalized on 05/27/10  . Renal cell carcinoma 2009   right renal mass, followed by Dr.  Ander Foster  . Sleep apnea     Past Surgical History:  Procedure Laterality Date  . BASAL CELL CARCINOMA EXCISION    . BIOPSY  12/18/2017   Procedure: BIOPSY;  Surgeon: [name] Artist, MD;  Location: [name];  Service: [facility];;  . ESOPHAGOGASTRODUODENOSCOPY (EGD) WITH PROPOFOL N/A 08/26/2014   Procedure: ESOPHAGOGASTRODUODENOSCOPY (EGD) WITH PROPOFOL;  Surgeon: [name] Artist, MD;  Location: [name];  Service: [facility];  Laterality: N/A;  . ESOPHAGOGASTRODUODENOSCOPY (EGD) WITH PROPOFOL N/A 12/18/2017   Procedure: ESOPHAGOGASTRODUODENOSCOPY (EGD) WITH PROPOFOL;  Surgeon: [name] Artist, MD;  Location: [name];  Service: [facility];  Laterality: N/A;  . EXPLORATORY LAPAROTOMY W/ BOWEL RESECTION  12/1999   segmental sbr of a hemangioma, repair of incarcerated umbilical hernia  . exploratory laparotomy, segmental small bowel resection of a hemangioma, repair of incarcerated umbelical hernia  66/2947  . EYE SURGERY     cataracts done  . renal cancer surgery with RF Ablation    . TONSILLECTOMY      Social History   Socioeconomic History  . Marital status: Divorced    Spouse name: Not on file  . Number of children: 4  . Years of education: Not on file  . Highest education level: Not on file  Occupational History  . Occupation: Engineer, manufacturing  . Occupation: Warehouse manager  Social Needs  . Financial resource strain: Not on file  . Food insecurity:    Worry: Not on file    Inability: Not on file  . Transportation needs:    Medical: Not on file    Non-medical: Not on file  Tobacco Use  . Smoking status: Former Smoker    Years: 10.00    Types: Cigars    Last attempt to quit: 02/29/1980    Years since quitting: 37.9  . Smokeless tobacco: Never Used  . Tobacco comment: quit when he was 77 years old  Substance and Sexual Activity  . Alcohol use: No  . Drug use: No  . Sexual activity: Not Currently  Lifestyle  . Physical activity:    Days per  week: Not on file    Minutes per session: Not on file  . Stress: Not on file  Relationships  . Social connections:    Talks on phone: Not on file    Gets together: Not on file    Attends religious service: Not on file    Active member of club or organization: Not on file    Attends meetings of clubs or organizations: Not on file    Relationship status: Not on file  Other Topics Concern  . Not on file  Social History Narrative   ** Merged History Encounter ** Married. Education: [employer].  Exercise: Eleptical times a week for 45 minutes.           Family History  Problem Relation Age of Onset  . Stroke Mother   . Diabetes Mother   . Obesity Brother   . Diabetes Brother   . Diabetes Father   . Asthma Paternal Grandmother   . Heart attack Paternal Grandfather   . Kidney cancer Paternal Aunt   . Colon cancer Neg Hx   . Esophageal cancer Neg Hx   . Rectal cancer Neg Hx   . Stomach cancer Neg Hx     Review of Systems: As noted in HPI.  All other systems were reviewed and are negative.  Physical Exam: BP 120/60   Pulse 87   Ht 5\' 10"  (1.778 m)   Wt 190 lb 3.2 oz (86.3 kg)   SpO2 97%   BMI 27.29 kg/m  Filed Weights   01/18/18 0838  Weight: 190 lb 3.2 oz (86.3 kg)   GENERAL:  Well appearing WM in NAD HEENT:  PERRL, EOMI, sclera are clear. Oropharynx is clear. NECK:  No jugular venous distention, carotid upstroke brisk and symmetric, no bruits, no thyromegaly or adenopathy LUNGS:  Clear to auscultation bilaterally CHEST:  Unremarkable HEART:  IRRR,  PMI not displaced or sustained,S1 and S2 within normal limits, no S3, no S4: no clicks, no rubs, no murmurs ABD:  Soft, nontender. BS +, no masses or bruits. No hepatomegaly, no splenomegaly EXT:  2 + pulses throughout, no edema, no cyanosis no clubbing SKIN:  Warm and dry.  No rashes NEURO:  Alert and oriented x 3. Cranial nerves II through XII intact. PSYCH:  Cognitively intact      LABORATORY DATA:  Lab Results    Component Value Date   WBC 4.9 09/16/2013   HGB 14.3 09/16/2013   HCT 41.6 09/16/2013   PLT 212 09/16/2013   GLUCOSE 120 (H) 06/20/2017   CHOL 124 11/08/2017   TRIG 56 11/08/2017   HDL 49 11/08/2017   LDLCALC 64 11/08/2017   ALT 9 11/08/2017   AST 17 11/08/2017   NA 139 06/20/2017   K 4.2 06/20/2017   CL 102 06/20/2017   CREATININE 1.21 06/20/2017   BUN 17 06/20/2017   CO2 25 06/20/2017   TSH 2.005 11/19/2012   PSA 2.79 11/19/2012   INR 1.17 05/18/2011     Assessment / Plan: 1. Atrial fibrillation. Permanent.  Rate well controlled on metoprolol and diltiazem. He is asymptomatic. Continue rate control medications and anticoagulation with Pradaxa. Labs followed by Dr Sandi Mariscal. I will follow up in one year.  2. CKD stage 2. Stable.  3. Barrett's esophagus.

## 2018-01-17 ENCOUNTER — Ambulatory Visit: Payer: Medicare PPO | Admitting: Podiatry

## 2018-01-18 ENCOUNTER — Encounter: Payer: Self-pay | Admitting: Cardiology

## 2018-01-18 ENCOUNTER — Ambulatory Visit: Payer: Medicare PPO | Admitting: Cardiology

## 2018-01-18 VITALS — BP 120/60 | HR 87 | Ht 70.0 in | Wt 190.2 lb

## 2018-01-18 DIAGNOSIS — I482 Chronic atrial fibrillation, unspecified: Secondary | ICD-10-CM

## 2018-01-18 DIAGNOSIS — E785 Hyperlipidemia, unspecified: Secondary | ICD-10-CM

## 2018-01-26 ENCOUNTER — Other Ambulatory Visit: Payer: Self-pay | Admitting: Pulmonary Disease

## 2018-02-02 ENCOUNTER — Other Ambulatory Visit: Payer: Self-pay

## 2018-02-02 MED ORDER — METOPROLOL SUCCINATE ER 50 MG PO TB24: ORAL_TABLET | ORAL | 8 refills | Status: DC

## 2018-03-02 ENCOUNTER — Telehealth: Payer: Self-pay | Admitting: Pulmonary Disease

## 2018-03-02 NOTE — Telephone Encounter (Signed)
Spoke with pt. He has a pending OV with [facility] on 06/25/2018. Pt wanted to make sure he did not need to fast for this appointment. Advised him that I did not think that [facility] would do any testing that he would need to be fasting for. He agreed and verbalized understanding. Nothing further was needed.

## 2018-03-14 ENCOUNTER — Encounter: Payer: Self-pay | Admitting: Podiatry

## 2018-03-14 ENCOUNTER — Ambulatory Visit: Payer: Medicare PPO | Admitting: Podiatry

## 2018-03-14 DIAGNOSIS — B351 Tinea unguium: Secondary | ICD-10-CM | POA: Diagnosis not present

## 2018-03-14 DIAGNOSIS — M79676 Pain in unspecified toe(s): Secondary | ICD-10-CM | POA: Diagnosis not present

## 2018-03-14 NOTE — Progress Notes (Signed)
Patient ID: [name] F Coble Jr., male   DOB: 02/21/1941, 77 y.o.   MRN: [id number] Complaint:  Visit Type: Patient returns to my office for continued preventative foot care services. Complaint: Patient states" my nails have grown long and thick and become painful to walk and wear shoes". The patient presents for preventative foot care services. No changes to ROS.  Callus right foot is no longer painful.    Podiatric Exam: Vascular: dorsalis pedis and posterior tibial pulses are palpable bilateral. Capillary return is immediate. Temperature gradient is WNL. Skin turgor WNL  Sensorium: Normal Semmes Weinstein monofilament test. Normal tactile sensation bilaterally. Nail Exam: Pt has thick disfigured discolored nails with subungual debris noted bilateral entire nail hallux through fifth toenails Ulcer Exam: There is no evidence of ulcer or pre-ulcerative changes or infection. Orthopedic Exam: Muscle tone and strength are WNL. No limitations in general ROM. No crepitus or effusions noted. Foot type and digits show no abnormalities. HAV with hammer toe  B/L. Skin:    Porokeratosis sub 3 right asymptomatic . No infection or ulcers.  Asymptomatic callus second left.    Diagnosis:  Onychomycosis, , Pain in right toe, pain in left toes   Treatment & Plan Procedures and Treatment: Consent by patient was obtained for treatment procedures. The patient understood the discussion of treatment and procedures well. All questions were answered thoroughly reviewed. Debridement of mycotic and hypertrophic toenails, 1 through 5 bilateral and clearing of subungual debris. No ulceration, no infection noted.   Return Visit-Office Procedure: Patient instructed to return to the office for a follow up visit 10 weeks  for continued evaluation and treatment.    Shailyn Weyandt DPM 

## 2018-03-30 ENCOUNTER — Other Ambulatory Visit: Payer: Self-pay

## 2018-04-02 MED ORDER — DABIGATRAN ETEXILATE MESYLATE 150 MG PO CAPS: 150.0000 mg | ORAL_CAPSULE | Freq: Two times a day (BID) | ORAL | 4 refills | Status: DC

## 2018-04-02 NOTE — Telephone Encounter (Signed)
Pt. Is a 78 yr old male  saw [name] on 01/18/18, wt at that visit was 86.3Kg. Last SCr was 1.21 on 06/20/17. CrCl is 43mL/min. Will refill Pradaxa 150mg  BID.

## 2018-05-02 ENCOUNTER — Other Ambulatory Visit: Payer: Self-pay | Admitting: *Deleted

## 2018-05-02 NOTE — Telephone Encounter (Signed)
This is Dr. Jordan's pt. °

## 2018-05-03 ENCOUNTER — Other Ambulatory Visit: Payer: Self-pay

## 2018-05-03 MED ORDER — DILTIAZEM HCL ER COATED BEADS 180 MG PO CP24: 180.0000 mg | ORAL_CAPSULE | Freq: Every day | ORAL | 11 refills | Status: DC

## 2018-05-23 ENCOUNTER — Other Ambulatory Visit: Payer: Self-pay

## 2018-05-23 ENCOUNTER — Encounter: Payer: Self-pay | Admitting: Podiatry

## 2018-05-23 ENCOUNTER — Ambulatory Visit: Payer: Medicare PPO | Admitting: Podiatry

## 2018-05-23 DIAGNOSIS — M79676 Pain in unspecified toe(s): Secondary | ICD-10-CM

## 2018-05-23 DIAGNOSIS — B351 Tinea unguium: Secondary | ICD-10-CM | POA: Diagnosis not present

## 2018-05-23 NOTE — Progress Notes (Signed)
Patient ID: [name] F Appleyard Jr., male   DOB: 04/19/1940, 77 y.o.   MRN: [id number] Complaint:  Visit Type: Patient returns to my office for continued preventative foot care services. Complaint: Patient states" my nails have grown long and thick and become painful to walk and wear shoes". The patient presents for preventative foot care services. No changes to ROS.  Callus right foot is no longer painful.    Podiatric Exam: Vascular: dorsalis pedis and posterior tibial pulses are palpable bilateral. Capillary return is immediate. Temperature gradient is WNL. Skin turgor WNL  Sensorium: Normal Semmes Weinstein monofilament test. Normal tactile sensation bilaterally. Nail Exam: Pt has thick disfigured discolored nails with subungual debris noted bilateral entire nail hallux through fifth toenails Ulcer Exam: There is no evidence of ulcer or pre-ulcerative changes or infection. Orthopedic Exam: Muscle tone and strength are WNL. No limitations in general ROM. No crepitus or effusions noted. Foot type and digits show no abnormalities. HAV with hammer toe  B/L. Skin:    Porokeratosis sub 3 right asymptomatic . No infection or ulcers.  Asymptomatic callus second left.    Diagnosis:  Onychomycosis, , Pain in right toe, pain in left toes   Treatment & Plan Procedures and Treatment: Consent by patient was obtained for treatment procedures. The patient understood the discussion of treatment and procedures well. All questions were answered thoroughly reviewed. Debridement of mycotic and hypertrophic toenails, 1 through 5 bilateral and clearing of subungual debris. No ulceration, no infection noted.   Return Visit-Office Procedure: Patient instructed to return to the office for a follow up visit 10 weeks  for continued evaluation and treatment.    Chyanna Flock DPM 

## 2018-05-31 ENCOUNTER — Encounter: Payer: Self-pay | Admitting: Nurse Practitioner

## 2018-05-31 ENCOUNTER — Other Ambulatory Visit: Payer: Self-pay

## 2018-05-31 ENCOUNTER — Ambulatory Visit (INDEPENDENT_AMBULATORY_CARE_PROVIDER_SITE_OTHER): Payer: Medicare PPO | Admitting: Nurse Practitioner

## 2018-05-31 DIAGNOSIS — G4733 Obstructive sleep apnea (adult) (pediatric): Secondary | ICD-10-CM

## 2018-05-31 DIAGNOSIS — Z9989 Dependence on other enabling machines and devices: Secondary | ICD-10-CM | POA: Diagnosis not present

## 2018-05-31 DIAGNOSIS — J310 Chronic rhinitis: Secondary | ICD-10-CM | POA: Diagnosis not present

## 2018-05-31 NOTE — Progress Notes (Signed)
Virtual Visit via Telephone Note  I connected with [name]. on 05/31/18 at 10:30 AM EDT by telephone and verified that I am speaking with the correct person using two identifiers.   I discussed the limitations, risks, security and privacy concerns of performing an evaluation and management service by telephone and the availability of in person appointments. I also discussed with the patient that there may be a patient responsible charge related to this service. The patient expressed understanding and agreed to proceed.   History of Present Illness: 78 y.o. male with COPD with asthma, rhinitis and OSA who is followed by [name].  Patient has a tele-visit today for routine follow-up on OSA.  He was last seen by [name] on 07/26/2017.  He complains today of issues with his CPAP mask.  He currently has a nasal mask but states that he has chronic nasal/sinus congestion especially at night.  States that this makes it hard for him to wear his CPAP at night.  He would like to try a full facemask.  He states that he does benefit from wearing his CPAP machine and feels much less drowsy during the day.  He does take Zyrtec, Flonase, and Singulair daily.  He states that he has been using Afrin nasal spray quite often specially at night.  He states that he does clean his equipment frequently. Denies f/c/s, n/v/d, hemoptysis, PND, leg swelling.  CPAP compliance report 03/02/18 - 05/30/18: usage days 73/90 (81%), average usage 6 hours 6 minutes, CPAP AutoSet 5-15 cmH20, AHI 7.0   Observations/Objective: Sleep tests: PSG 08/05/10>>AHI 24.2, SpO2 low 77%, PLMI 15.8. PSG with oral appliance 02/07/12>>AHI 11.5, SpO2 low 78%. Both obstructive and central events. Auto CPAP 01/14/14 to 02/12/14 >>used on 17 of 30 nights with average 4 hrs and 55 min. Average AHI is 11 with median CPAP 7 cm H2O and 95 th percentile CPAP 9 cm H20. Mostly central events.  Pulmonary tests PFT 07/26/11>>FEV1 2.62(91%), FEV1%  66, TLC 6.55(101%), DLCO 94%, +BD  Assessment and Plan:  OSA:  DISCUSSION: Patient complains today of issues with his CPAP mask.  He currently has a nasal mask but states that he has chronic nasal/sinus congestion especially at night.  States that this makes it hard for him to wear his CPAP at night.  He would like to try a full facemask.  He does take Zyrtec, Flonase, and Singulair daily.  He states that he has been using Afrin nasal spray quite often specially at night.  He states that he does clean his equipment frequently.   Patient Instructions  Will place referral to ENT for chronic nasal congestion/rhinitis  Patient continues to benefit from CPAP with good compliance and control documented Will order full face mask for patient Continue CPAP at current settings Continue current medications Goal of 4 hours or more usage per night Maintain healthy weight Do not drive if drowsy  Rhinitis: This issue seems to be chronic. We discussed limiting use of Afrin due to rebound congestion. Patient states that he will quit using afrin. Patient sounded congested over the phone. Will refer to ENT.   - continue zyrtec - flonase daily - nasal irrigation bid - continue singulair   Follow Up Instructions: Follow up with NP in 8 weeks (for follow up on new mask/compliance) or sooner if needed   I discussed the assessment and treatment plan with the patient. The patient was provided an opportunity to ask questions and all were answered. The patient  agreed with the plan and demonstrated an understanding of the instructions.   The patient was advised to call back or seek an in-person evaluation if the symptoms worsen or if the condition fails to improve as anticipated.  I provided 22 minutes of non-face-to-face time during this encounter.   Fenton Foy, NP

## 2018-05-31 NOTE — Assessment & Plan Note (Signed)
DISCUSSION: Patient complains today of issues with his CPAP mask.  He currently has a nasal mask but states that he has chronic nasal/sinus congestion especially at night.  States that this makes it hard for him to wear his CPAP at night.  He would like to try a full facemask.  He does take Zyrtec, Flonase, and Singulair daily.  He states that he has been using Afrin nasal spray quite often specially at night.  He states that he does clean his equipment frequently.   Patient Instructions  Will place referral to ENT for chronic nasal congestion/rhinitis  Patient continues to benefit from CPAP with good compliance and control documented Will order full face mask for patient Continue CPAP at current settings Continue current medications Goal of 4 hours or more usage per night Maintain healthy weight Do not drive if drowsy Follow up with NP in 8 weeks (for follow up on new mask/compliance) or sooner if needed

## 2018-05-31 NOTE — Patient Instructions (Addendum)
Will place referral to ENT for chronic nasal congestion/rhinitis - continue zyrtec - flonase daily - nasal irrigation bid - continue singulair   Patient continues to benefit from CPAP with good compliance and control documented Will order full face mask for patient Continue CPAP at current settings Continue current medications Goal of 4 hours or more usage per night Maintain healthy weight Do not drive if drowsy   Follow up with NP in 8 weeks (for follow up on new mask/compliance) or sooner if needed

## 2018-05-31 NOTE — Assessment & Plan Note (Signed)
This issue seems to be chronic. We discussed limiting use of Afrin due to rebound congestion. Patient states that he will quit using afrin. Patient sounded congested over the phone. Will refer to ENT.   - continue zyrtec - flonase daily - nasal irrigation bid - continue singulair

## 2018-06-13 ENCOUNTER — Telehealth: Payer: Self-pay | Admitting: Cardiology

## 2018-06-13 NOTE — Telephone Encounter (Signed)
Unsure who called pt but LM for him to call us back in the A.M.

## 2018-06-13 NOTE — Telephone Encounter (Signed)
F/U Message            Patient returned a call to Dr. Doug Sou office

## 2018-06-14 NOTE — Telephone Encounter (Signed)
Left detailed message on voice mail Informed pt did not see any documentation for a phone call not sure if was old message.Pt may call back ./cy

## 2018-06-25 ENCOUNTER — Ambulatory Visit: Payer: Medicare PPO | Admitting: Pulmonary Disease

## 2018-07-02 ENCOUNTER — Ambulatory Visit (INDEPENDENT_AMBULATORY_CARE_PROVIDER_SITE_OTHER): Payer: Medicare PPO | Admitting: Nurse Practitioner

## 2018-07-02 ENCOUNTER — Encounter: Payer: Self-pay | Admitting: Nurse Practitioner

## 2018-07-02 ENCOUNTER — Other Ambulatory Visit: Payer: Self-pay | Admitting: General Surgery

## 2018-07-02 ENCOUNTER — Other Ambulatory Visit: Payer: Self-pay

## 2018-07-02 DIAGNOSIS — J310 Chronic rhinitis: Secondary | ICD-10-CM

## 2018-07-02 DIAGNOSIS — Z9989 Dependence on other enabling machines and devices: Secondary | ICD-10-CM

## 2018-07-02 DIAGNOSIS — G4733 Obstructive sleep apnea (adult) (pediatric): Secondary | ICD-10-CM

## 2018-07-02 NOTE — Assessment & Plan Note (Signed)
Patient complains today of issues with his CPAP mask.  He currently has a nasal mask but states that he has chronic nasal/sinus congestion especially at night.  States that this makes it hard for him to wear his CPAP at night.  He would like to try a full facemask.  At last visit he was ordered ENT referral and a full facemask but unfortunately due to current caloric restrictions he is not gotten either processed yet.  We will reorder today.  Patient Instructions  Will place referral to ENT for chronic nasal congestion/rhinitis  Patient continues to benefit from CPAP with good compliance and control documented Will order full face mask for patient Continue CPAP at current settings Continue current medications Goal of 4 hours or more usage per night Maintain healthy weight Do not drive if drowsy  Rhinitis: This issue seems to be chronic. We discussed limiting use of Afrin due to rebound congestion. Patient states that he will quit using afrin. Patient sounded congested over the phone. Will refer to ENT.   - continue zyrtec - flonase daily - nasal irrigation bid - continue singulair   Follow Up Instructions: Follow up with [name] in 4 months or sooner if needed

## 2018-07-02 NOTE — Patient Instructions (Signed)
Will place referral to ENT for chronic nasal congestion/rhinitis  Patient continues to benefit from CPAP with good compliance and control documented Will order full face mask for patient Continue CPAP at current settings Continue current medications Goal of 4 hours or more usage per night Maintain healthy weight Do not drive if drowsy  Rhinitis: This issue seems to be chronic. We discussed limiting use of Afrin due to rebound congestion. Patient states that he will quit using afrin. Patient sounded congested over the phone. Will refer to ENT.   - continue zyrtec - flonase daily - nasal irrigation bid - continue singulair   Follow Up Instructions: Follow up with [name] in 4 months or sooner if needed

## 2018-07-02 NOTE — Progress Notes (Signed)
Virtual Visit via Telephone Note  I connected with [name]. on 07/02/18 at 10:00 AM EDT by telephone and verified that I am speaking with the correct person using two identifiers.  Location: Patient: home Provider: office   I discussed the limitations, risks, security and privacy concerns of performing an evaluation and management service by telephone and the availability of in person appointments. I also discussed with the patient that there may be a patient responsible charge related to this service. The patient expressed understanding and agreed to proceed.   History of Present Illness: 78 y.o.malewith COPD with asthma, rhinitis and OSA who is followed by [name].  Patient has a tele-visit today for follow-up on OSA.  He was last seen by me on 05/31/2018.  He states that he has been doing well overall.  He has not been able to see ENT for chronic rhinitis yet.  He states that he is still using a nasal mask and would like to switch to a full facemask.  He states that he does wear his BiPAP nightly is benefiting from use.  He is compliant with Zyrtec, Flonase, and Singulair daily.  He still continues to complain of chronic nasal congestion. Denies f/c/s, n/v/d, hemoptysis, PND, leg swelling.    Observations/Objective: Sleep tests: PSG 08/05/10>>AHI 24.2, SpO2 low 77%, PLMI 15.8. PSG with oral appliance 02/07/12>>AHI 11.5, SpO2 low 78%. Both obstructive and central events. Auto CPAP 01/14/14 to 02/12/14 >>used on 17 of 30 nights with average 4 hrs and 55 min. Average AHI is 11 with median CPAP 7 cm H2O and 95 th percentile CPAP 9 cm H20. Mostly central events.  Pulmonary tests PFT 07/26/11>>FEV1 2.62(91%), FEV1% 66, TLC 6.55(101%), DLCO 94%, +BD  Assessment and Plan: Patient complains today of issues with his CPAP mask.  He currently has a nasal mask but states that he has chronic nasal/sinus congestion especially at night.  States that this makes it hard for him to wear his  CPAP at night.  He would like to try a full facemask.  At last visit he was ordered ENT referral and a full facemask but unfortunately due to current caloric restrictions he is not gotten either processed yet.  We will reorder today.  Patient Instructions  Will place referral to ENT for chronic nasal congestion/rhinitis  Patient continues to benefit from CPAP with good compliance and control documented Will order full face mask for patient Continue CPAP at current settings Continue current medications Goal of 4 hours or more usage per night Maintain healthy weight Do not drive if drowsy  Rhinitis: This issue seems to be chronic. We discussed limiting use of Afrin due to rebound congestion. Patient states that he will quit using afrin. Patient sounded congested over the phone. Will refer to ENT.   - continue zyrtec - flonase daily - nasal irrigation bid - continue singulair     Follow Up Instructions: Follow up with [name] in 4 months or sooner if needed   I discussed the assessment and treatment plan with the patient. The patient was provided an opportunity to ask questions and all were answered. The patient agreed with the plan and demonstrated an understanding of the instructions.   The patient was advised to call back or seek an in-person evaluation if the symptoms worsen or if the condition fails to improve as anticipated.  I provided 22 minutes of non-face-to-face time during this encounter.   Fenton Foy, NP

## 2018-08-01 ENCOUNTER — Ambulatory Visit: Payer: Medicare PPO | Admitting: Podiatry

## 2018-08-14 DIAGNOSIS — R159 Full incontinence of feces: Secondary | ICD-10-CM | POA: Insufficient documentation

## 2018-08-14 DIAGNOSIS — L258 Unspecified contact dermatitis due to other agents: Secondary | ICD-10-CM | POA: Insufficient documentation

## 2018-08-15 ENCOUNTER — Ambulatory Visit: Payer: Medicare PPO | Admitting: Podiatry

## 2018-08-15 ENCOUNTER — Encounter: Payer: Self-pay | Admitting: Podiatry

## 2018-08-15 ENCOUNTER — Other Ambulatory Visit: Payer: Self-pay

## 2018-08-15 DIAGNOSIS — M79675 Pain in left toe(s): Secondary | ICD-10-CM | POA: Diagnosis not present

## 2018-08-15 DIAGNOSIS — M79674 Pain in right toe(s): Secondary | ICD-10-CM | POA: Diagnosis not present

## 2018-08-15 DIAGNOSIS — B351 Tinea unguium: Secondary | ICD-10-CM

## 2018-08-15 NOTE — Progress Notes (Signed)
Patient ID: [name] Fetters., male   DOB: [birth year], 78 y.o.   MRN: [id number] Complaint:  Visit Type: Patient returns to my office for continued preventative foot care services. Complaint: Patient states" my nails have grown long and thick and become painful to walk and wear shoes". The patient presents for preventative foot care services. No changes to ROS.  Callus right foot is no longer painful.    Podiatric Exam: Vascular: dorsalis pedis and posterior tibial pulses are palpable bilateral. Capillary return is immediate. Temperature gradient is WNL. Skin turgor WNL  Sensorium: Normal Semmes Weinstein monofilament test. Normal tactile sensation bilaterally. Nail Exam: Pt has thick disfigured discolored nails with subungual debris noted bilateral entire nail hallux through fifth toenails Ulcer Exam: There is no evidence of ulcer or pre-ulcerative changes or infection. Orthopedic Exam: Muscle tone and strength are WNL. No limitations in general ROM. No crepitus or effusions noted. Foot type and digits show no abnormalities. HAV with hammer toe  B/L. Skin:    Porokeratosis sub 3 right asymptomatic . No infection or ulcers.  Asymptomatic callus second left.    Diagnosis:  Onychomycosis, , Pain in right toe, pain in left toes   Treatment & Plan Procedures and Treatment: Consent by patient was obtained for treatment procedures. The patient understood the discussion of treatment and procedures well. All questions were answered thoroughly reviewed. Debridement of mycotic and hypertrophic toenails, 1 through 5 bilateral and clearing of subungual debris. No ulceration, no infection noted.   Return Visit-Office Procedure: Patient instructed to return to the office for a follow up visit 10 weeks  for continued evaluation and treatment.    Gardiner Barefoot DPM

## 2018-08-29 ENCOUNTER — Other Ambulatory Visit: Payer: Self-pay

## 2018-08-29 DIAGNOSIS — I4891 Unspecified atrial fibrillation: Secondary | ICD-10-CM

## 2018-08-29 NOTE — Telephone Encounter (Signed)
Called and discussed getting labs pt compliant to come awaiting results

## 2018-08-30 LAB — COMPREHENSIVE METABOLIC PANEL
ALT: 10 IU/L (ref 0–44)
AST: 15 IU/L (ref 0–40)
Albumin/Globulin Ratio: 1.1 — ABNORMAL LOW (ref 1.2–2.2)
Albumin: 4 g/dL (ref 3.7–4.7)
Alkaline Phosphatase: 67 IU/L (ref 39–117)
BUN/Creatinine Ratio: 15 (ref 10–24)
BUN: 19 mg/dL (ref 8–27)
Bilirubin Total: 0.5 mg/dL (ref 0.0–1.2)
CO2: 25 mmol/L (ref 20–29)
Calcium: 9.4 mg/dL (ref 8.6–10.2)
Chloride: 101 mmol/L (ref 96–106)
Creatinine, Ser: 1.28 mg/dL — ABNORMAL HIGH (ref 0.76–1.27)
GFR calc Af Amer: 61 mL/min/{1.73_m2} (ref >59)
GFR calc non Af Amer: 53 mL/min/{1.73_m2} — ABNORMAL LOW (ref >59)
Globulin, Total: 3.5 g/dL (ref 1.5–4.5)
Glucose: 80 mg/dL (ref 65–99)
Potassium: 4.7 mmol/L (ref 3.5–5.2)
Sodium: 139 mmol/L (ref 134–144)
Total Protein: 7.5 g/dL (ref 6.0–8.5)

## 2018-08-30 MED ORDER — DABIGATRAN ETEXILATE MESYLATE 150 MG PO CAPS: 150.0000 mg | ORAL_CAPSULE | Freq: Two times a day (BID) | ORAL | 4 refills | Status: DC

## 2018-08-30 NOTE — Telephone Encounter (Signed)
Pt is a 78yom requesting pradaxa 150mg . Wt 85.7kg, scr 1.28(08/29/18), ccr 5ml/min, lov w/jordan 01/18/18, dx afib

## 2018-09-28 NOTE — Progress Notes (Signed)
Reviewed and agree with assessment/plan.   Srinivas Lippman, MD [facility] 02/24/2016, 12:24 PM Pager:  [phone]  

## 2018-10-02 DIAGNOSIS — H903 Sensorineural hearing loss, bilateral: Secondary | ICD-10-CM | POA: Insufficient documentation

## 2018-10-09 NOTE — Progress Notes (Signed)
Reviewed and agree with assessment/plan.   Tamara Monteith, MD  Pulmonary/Critical Care 02/24/2016, 12:24 PM Pager:  [phone]  

## 2018-10-23 ENCOUNTER — Other Ambulatory Visit: Payer: Self-pay

## 2018-10-23 MED ORDER — METOPROLOL SUCCINATE ER 50 MG PO TB24: ORAL_TABLET | ORAL | 1 refills | Status: DC

## 2018-10-24 ENCOUNTER — Other Ambulatory Visit: Payer: Self-pay

## 2018-10-24 ENCOUNTER — Ambulatory Visit: Payer: Medicare PPO | Admitting: Podiatry

## 2018-10-24 ENCOUNTER — Encounter: Payer: Self-pay | Admitting: Podiatry

## 2018-10-24 DIAGNOSIS — M79675 Pain in left toe(s): Secondary | ICD-10-CM

## 2018-10-24 DIAGNOSIS — B351 Tinea unguium: Secondary | ICD-10-CM | POA: Diagnosis not present

## 2018-10-24 DIAGNOSIS — M79674 Pain in right toe(s): Secondary | ICD-10-CM | POA: Diagnosis not present

## 2018-10-24 MED ORDER — METOPROLOL SUCCINATE ER 50 MG PO TB24: ORAL_TABLET | ORAL | 1 refills | Status: DC

## 2018-10-24 NOTE — Progress Notes (Signed)
Patient ID: [name]., male   DOB: 11/17/1940, 78 y.o.   MRN: [id number] Complaint:  Visit Type: Patient returns to my office for continued preventative foot care services. Complaint: Patient states" my nails have grown long and thick and become painful to walk and wear shoes". The patient presents for preventative foot care services. No changes to ROS.  Callus right foot is no longer painful.    Podiatric Exam: Vascular: dorsalis pedis and posterior tibial pulses are palpable bilateral. Capillary return is immediate. Temperature gradient is WNL. Skin turgor WNL  Sensorium: Normal Semmes Weinstein monofilament test. Normal tactile sensation bilaterally. Nail Exam: Pt has thick disfigured discolored nails with subungual debris noted bilateral entire nail hallux through fifth toenails Ulcer Exam: There is no evidence of ulcer or pre-ulcerative changes or infection. Orthopedic Exam: Muscle tone and strength are WNL. No limitations in general ROM. No crepitus or effusions noted. Foot type and digits show no abnormalities. HAV with hammer toe  B/L. Skin:    Porokeratosis sub 3 right asymptomatic . No infection or ulcers.  Asymptomatic callus second left.    Diagnosis:  Onychomycosis, , Pain in right toe, pain in left toes   Treatment & Plan Procedures and Treatment: Consent by patient was obtained for treatment procedures. The patient understood the discussion of treatment and procedures well. All questions were answered thoroughly reviewed. Debridement of mycotic and hypertrophic toenails, 1 through 5 bilateral and clearing of subungual debris. No ulceration, no infection noted.   Return Visit-Office Procedure: Patient instructed to return to the office for a follow up visit 10 weeks  for continued evaluation and treatment.    Gardiner Barefoot DPM

## 2018-10-31 ENCOUNTER — Other Ambulatory Visit: Payer: Self-pay

## 2018-10-31 MED ORDER — DILTIAZEM HCL ER COATED BEADS 180 MG PO CP24: 180.0000 mg | ORAL_CAPSULE | Freq: Every day | ORAL | 11 refills | Status: DC

## 2018-11-27 ENCOUNTER — Other Ambulatory Visit: Payer: Self-pay

## 2018-11-27 MED ORDER — METOPROLOL SUCCINATE ER 50 MG PO TB24: ORAL_TABLET | ORAL | 1 refills | Status: DC

## 2019-01-01 ENCOUNTER — Other Ambulatory Visit: Payer: Self-pay

## 2019-01-01 ENCOUNTER — Ambulatory Visit: Payer: Medicare PPO | Admitting: Podiatry

## 2019-01-01 ENCOUNTER — Encounter: Payer: Self-pay | Admitting: Podiatry

## 2019-01-01 DIAGNOSIS — B351 Tinea unguium: Secondary | ICD-10-CM

## 2019-01-01 DIAGNOSIS — M79675 Pain in left toe(s): Secondary | ICD-10-CM | POA: Diagnosis not present

## 2019-01-01 DIAGNOSIS — M7751 Other enthesopathy of right foot: Secondary | ICD-10-CM

## 2019-01-01 DIAGNOSIS — M79674 Pain in right toe(s): Secondary | ICD-10-CM | POA: Diagnosis not present

## 2019-01-01 DIAGNOSIS — M21621 Bunionette of right foot: Secondary | ICD-10-CM

## 2019-01-01 DIAGNOSIS — M779 Enthesopathy, unspecified: Secondary | ICD-10-CM

## 2019-01-01 NOTE — Progress Notes (Signed)
Patient ID: [name]., male   DOB: 1940/12/28, 78 y.o.   MRN: [id number] Complaint:  Visit Type: Patient returns to my office for continued preventative foot care services. Complaint: Patient states" my nails have grown long and thick and become painful to walk and wear shoes". The patient presents for preventative foot care services. No changes to ROS.  Patient says he has pain on the outside bone right forefoot.  Podiatric Exam: Vascular: dorsalis pedis and posterior tibial pulses are palpable bilateral. Capillary return is immediate. Temperature gradient is WNL. Skin turgor WNL  Sensorium: Normal Semmes Weinstein monofilament test. Normal tactile sensation bilaterally. Nail Exam: Pt has thick disfigured discolored nails with subungual debris noted bilateral entire nail hallux through fifth toenails Ulcer Exam: There is no evidence of ulcer or pre-ulcerative changes or infection. Orthopedic Exam: Muscle tone and strength are WNL. No limitations in general ROM. No crepitus or effusions noted. Foot type and digits show no abnormalities. HAV with hammer toe  B/L.  Tailor bunion 5th MPJ right foot. Skin:    Porokeratosis sub 3 right asymptomatic . No infection or ulcers.  Asymptomatic callus second left.  Painful callus 5th metatarsal right foot.  Diagnosis:  Onychomycosis, , Pain in right toe, pain in left toes ,  Capsulitis 5th MPJ  Tailor bunion 5th MPJ right Treatment & Plan Procedures and Treatment: Consent by patient was obtained for treatment procedures. The patient understood the discussion of treatment and procedures well. All questions were answered thoroughly reviewed. Debridement of mycotic and hypertrophic toenails, 1 through 5 bilateral and clearing of subungual debris. No ulceration, no infection noted.    Injection therapy 5th metatarsal.  Injection therapy using 1.0 cc. Of 2% xylocaine( 20 mg.) plus 1 cc. of kenalog-la ( 10 mg) plus 1/2 cc. of dexamethazone phosphate ( 2 mg)   Discussed conservative vs. Surgical treatment. Return Visit-Office Procedure: Patient instructed to return to the office for a follow up visit 10 weeks  for continued evaluation and treatment.    Gardiner Barefoot DPM

## 2019-01-20 NOTE — Progress Notes (Signed)
[name]. Date of Birth: February 16, 1941 Medical Record [id number]  History of Present Illness: Mr. Sphar is seen for follow up Afib.He has a history of atrial fibrillation dating back to 2012. Initially this was paroxysmal but multiple Ecgs since then show persistent Afib with controlled rate. He is managed with metoprolol, diltiazem, and pradaxa.   He is no longer able to go to the gym but does walk regularly at home and does some light weight work. He denies any chest pain, SOB, or palpitations.  No dizziness or lightheadedness.  He still uses CPAP.   Allergies as of 01/23/2019      Reactions   Tamsulosin Other (See Comments)   dizziness   Proair Hfa [albuterol] Palpitations   Only use in emergency due to has heart problems      Medication List       Accurate as of January 23, 2019  9:05 AM. If you have any questions, ask your nurse or doctor.        STOP taking these medications   OVER THE COUNTER MEDICATION Stopped by:  [place], MD   OVER THE [place] by:  [place], MD     TAKE these medications   albuterol 108 (90 Base) MCG/ACT inhaler Commonly known as: VENTOLIN HFA Inhale 2 puffs into the lungs every 4 (four) hours as needed for wheezing or shortness of breath (cough, shortness of breath or wheezing.).   alfuzosin 10 MG 24 hr tablet Commonly known as: UROXATRAL Take 1 tablet by mouth daily.   AMBULATORY NON FORMULARY MEDICATION CPAP at night   AMBULATORY NON FORMULARY MEDICATION UDO's Oil 3-6-9 Blend Take 1 tbsp every day   AMBULATORY NON FORMULARY MEDICATION Cardio Protegen Liquid 2 tbsp every morning   AMBULATORY NON FORMULARY MEDICATION [place]  Take 1 tablet twice a day   dabigatran 150 MG Caps capsule Commonly known as: Pradaxa Take 1 capsule (150 mg total) by mouth 2 (two) times daily.   diltiazem 180 MG 24 hr capsule Commonly known as: Cartia XT Take 1 capsule (180 mg total) by mouth daily.   dutasteride  0.5 MG capsule Commonly known as: AVODART Take 0.5 mg by mouth daily.   fluticasone 50 MCG/ACT nasal spray Commonly known as: FLONASE 2 sprays by Each Nare route daily for 30 days.   metoprolol succinate 50 MG 24 hr tablet Commonly known as: TOPROL-XL TAKE 1 TABLET BY MOUTH EVERY DAY WITH OR FOLLOWING A MEAL   MIRALAX PO Take by mouth.   montelukast 10 MG tablet Commonly known as: SINGULAIR TAKE 1 TABLET(10 MG) BY MOUTH AT BEDTIME   omeprazole 20 MG capsule Commonly known as: PRILOSEC TAKE ONE CAPSULE BY MOUTH EVERY DAY   OVER THE COUNTER MEDICATION Relaxium Sleep, One capsule before bedtime.   pravastatin 20 MG tablet Commonly known as: PRAVACHOL Take 20 mg daily by mouth.   PREVAGEN PO Take 1 tablet by mouth daily.   tadalafil 5 MG tablet Commonly known as: CIALIS TK 1 T PO D   UNABLE TO FIND Place into the nose.        Allergies  Allergen Reactions  . Tamsulosin Other (See Comments)    dizziness  . Proair Hfa [Albuterol] Palpitations    Only use in emergency due to has heart problems    Past Medical History:  Diagnosis Date  . Allergy   . Arrhythmia   . Atrial fibrillation ([place])   . Barrett esophagus   . Barrett's esophagus   .  Blood transfusion without reported diagnosis   . BPH (benign prostatic hyperplasia)   . Cervical stenosis of spine   . Chronic obstructive asthma   . Difficult intubation 13 or 14 years ago, at [facility] long   airway was up near nostrils with intubation per patient with bowel surgery  . ED (erectile dysfunction)   . GERD (gastroesophageal reflux disease)   . Hammer toe of right foot   . Hiatal hernia   . Hyperlipidemia   . Hypertension   . OSA (obstructive sleep apnea) 08/05/10 PSG   AHI 24  . Paroxysmal atrial fibrillation ([place])    Hospitalized on 05/27/10  . Renal cell carcinoma 2009   right renal mass, followed by Dr. Ander Slade  . Sleep apnea     Past Surgical History:  Procedure Laterality Date  . BASAL CELL  CARCINOMA EXCISION    . BIOPSY  12/18/2017   Procedure: BIOPSY;  Surgeon: [name], MD;  Location: [facility];  Service: Endoscopy;;  . ESOPHAGOGASTRODUODENOSCOPY (EGD) WITH PROPOFOL N/A 08/26/2014   Procedure: ESOPHAGOGASTRODUODENOSCOPY (EGD) WITH PROPOFOL;  Surgeon: [name], MD;  Location: [facility];  Service: Endoscopy;  Laterality: N/A;  . ESOPHAGOGASTRODUODENOSCOPY (EGD) WITH PROPOFOL N/A 12/18/2017   Procedure: ESOPHAGOGASTRODUODENOSCOPY (EGD) WITH PROPOFOL;  Surgeon: [name], MD;  Location: [facility];  Service: Endoscopy;  Laterality: N/A;  . EXPLORATORY LAPAROTOMY W/ BOWEL RESECTION  12/1999   segmental sbr of a hemangioma, repair of incarcerated umbilical hernia  . exploratory laparotomy, segmental small bowel resection of a hemangioma, repair of incarcerated umbelical hernia  [id number]  . EYE SURGERY     cataracts done  . renal cancer surgery with RF Ablation    . TONSILLECTOMY      Social History   Socioeconomic History  . Marital status: Divorced    Spouse name: Not on file  . Number of children: 4  . Years of education: Not on file  . Highest education level: Not on file  Occupational History  . Occupation: Engineer, manufacturing  . Occupation: Warehouse manager  Social Needs  . Financial resource strain: Not on file  . Food insecurity    Worry: Not on file    Inability: Not on file  . Transportation needs    Medical: Not on file    Non-medical: Not on file  Tobacco Use  . Smoking status: Former Smoker    Years: 10.00    Types: Cigars    Quit date: 02/29/1980    Years since quitting: 38.9  . Smokeless tobacco: Never Used  . Tobacco comment: quit when he was 78 years old  Substance and Sexual Activity  . Alcohol use: No  . Drug use: No  . Sexual activity: Not Currently  Lifestyle  . Physical activity    Days per week: Not on file    Minutes per session: Not on file  . Stress: Not on file  Relationships  . Social  Herbalist on phone: Not on file    Gets together: Not on file    Attends religious service: Not on file    Active member of club or organization: Not on file    Attends meetings of clubs or organizations: Not on file    Relationship status: Not on file  Other Topics Concern  . Not on file  Social History Narrative   ** Merged History Encounter ** Married. Education: [employer]. Exercise: Eleptical times a week for 45 minutes.  Family History  Problem Relation Age of Onset  . Stroke Mother   . Diabetes Mother   . Obesity Brother   . Diabetes Brother   . Diabetes Father   . Asthma Paternal Grandmother   . Heart attack Paternal Grandfather   . Kidney cancer Paternal Aunt   . Colon cancer Neg Hx   . Esophageal cancer Neg Hx   . Rectal cancer Neg Hx   . Stomach cancer Neg Hx     Review of Systems: As noted in HPI.  All other systems were reviewed and are negative.  Physical Exam: BP 130/76   Pulse 81   Ht 5\' 10"  (1.778 m)   Wt 177 lb 6.4 oz (80.5 kg)   SpO2 99%   BMI 25.45 kg/m  Filed Weights   01/23/19 0859  Weight: 177 lb 6.4 oz (80.5 kg)   GENERAL:  Well appearing WM in NAD HEENT:  PERRL, EOMI, sclera are clear. Oropharynx is clear. NECK:  No jugular venous distention, carotid upstroke brisk and symmetric, no bruits, no thyromegaly or adenopathy LUNGS:  Clear to auscultation bilaterally CHEST:  Unremarkable HEART:  IRRR,  PMI not displaced or sustained,S1 and S2 within normal limits, no S3, no S4: no clicks, no rubs, no murmurs ABD:  Soft, nontender. BS +, no masses or bruits. No hepatomegaly, no splenomegaly EXT:  2 + pulses throughout, no edema, no cyanosis no clubbing SKIN:  Warm and dry.  No rashes NEURO:  Alert and oriented x 3. Cranial nerves II through XII intact. PSYCH:  Cognitively intact   LABORATORY DATA:  Lab Results  Component Value Date   WBC 4.9 09/16/2013   HGB 14.3 09/16/2013   HCT 41.6 09/16/2013   PLT 212 09/16/2013    GLUCOSE 80 08/29/2018   CHOL 124 11/08/2017   TRIG 56 11/08/2017   HDL 49 11/08/2017   LDLCALC 64 11/08/2017   ALT 10 08/29/2018   AST 15 08/29/2018   NA 139 08/29/2018   K 4.7 08/29/2018   CL 101 08/29/2018   CREATININE 1.28 (H) 08/29/2018   BUN 19 08/29/2018   CO2 25 08/29/2018   TSH 2.005 11/19/2012   PSA 2.79 11/19/2012   INR 1.17 05/18/2011   Ecg today shows Afib with rate 81. LAD. I have personally reviewed and interpreted this study.   Assessment / Plan: 1. Atrial fibrillation. Permanent.  Rate well controlled on metoprolol and diltiazem. He is asymptomatic. Continue rate control medications and anticoagulation with Pradaxa.  I will follow up in one year.  2. CKD stage 2. Stable.

## 2019-01-21 ENCOUNTER — Other Ambulatory Visit: Payer: Self-pay

## 2019-01-21 MED ORDER — DABIGATRAN ETEXILATE MESYLATE 150 MG PO CAPS: 150.0000 mg | ORAL_CAPSULE | Freq: Two times a day (BID) | ORAL | 4 refills | Status: DC

## 2019-01-23 ENCOUNTER — Ambulatory Visit: Payer: Medicare PPO | Admitting: Cardiology

## 2019-01-23 ENCOUNTER — Encounter: Payer: Self-pay | Admitting: Cardiology

## 2019-01-23 ENCOUNTER — Other Ambulatory Visit: Payer: Self-pay

## 2019-01-23 VITALS — BP 130/76 | HR 81 | Ht 70.0 in | Wt 177.4 lb

## 2019-01-23 DIAGNOSIS — I482 Chronic atrial fibrillation, unspecified: Secondary | ICD-10-CM

## 2019-03-18 ENCOUNTER — Ambulatory Visit: Payer: Medicare PPO | Admitting: Podiatry

## 2019-03-20 ENCOUNTER — Encounter: Payer: Self-pay | Admitting: Podiatry

## 2019-03-20 ENCOUNTER — Other Ambulatory Visit: Payer: Self-pay

## 2019-03-20 ENCOUNTER — Ambulatory Visit: Payer: Medicare PPO | Admitting: Podiatry

## 2019-03-20 DIAGNOSIS — M79675 Pain in left toe(s): Secondary | ICD-10-CM | POA: Diagnosis not present

## 2019-03-20 DIAGNOSIS — L84 Corns and callosities: Secondary | ICD-10-CM | POA: Insufficient documentation

## 2019-03-20 DIAGNOSIS — M79674 Pain in right toe(s): Secondary | ICD-10-CM | POA: Diagnosis not present

## 2019-03-20 DIAGNOSIS — M21621 Bunionette of right foot: Secondary | ICD-10-CM

## 2019-03-20 DIAGNOSIS — B351 Tinea unguium: Secondary | ICD-10-CM

## 2019-03-20 NOTE — Progress Notes (Signed)
Patient ID: [name]., male   DOB: Mar 13, 1940, 79 y.o.   MRN: [id number] Complaint:  Visit Type: Patient returns to my office for continued preventative foot care services. Complaint: Patient states" my nails have grown long and thick and become painful to walk and wear shoes". The patient presents for preventative foot care services. No changes to ROS.  Patient says he has pain on the outside bone right forefoot.  Podiatric Exam: Vascular: dorsalis pedis and posterior tibial pulses are palpable bilateral. Capillary return is immediate. Temperature gradient is WNL. Skin turgor WNL  Sensorium: Normal Semmes Weinstein monofilament test. Normal tactile sensation bilaterally. Nail Exam: Pt has thick disfigured discolored nails with subungual debris noted bilateral entire nail hallux through fifth toenails Ulcer Exam: There is no evidence of ulcer or pre-ulcerative changes or infection. Orthopedic Exam: Muscle tone and strength are WNL. No limitations in general ROM. No crepitus or effusions noted. Foot type and digits show no abnormalities. HAV with hammer toe  B/L.  Tailor bunion 5th MPJ right foot. Skin:    Porokeratosis sub 3 right asymptomatic . No infection or ulcers.  Asymptomatic callus second left.  Painful callus 5th metatarsal right foot.  Diagnosis:  Onychomycosis, , Pain in right toe, pain in left toes ,  Porokeratosis 5th met right foot. Treatment & Plan Procedures and Treatment: Consent by patient was obtained for treatment procedures. The patient understood the discussion of treatment and procedures well. All questions were answered thoroughly reviewed. Debridement of mycotic and hypertrophic toenails, 1 through 5 bilateral and clearing of subungual debris. No ulceration, no infection noted.   Debride porokeratosis  Right foot. Return Visit-Office Procedure: Patient instructed to return to the office for a follow up visit 10 weeks  for continued evaluation and  treatment.    Gardiner Barefoot DPM

## 2019-04-26 ENCOUNTER — Other Ambulatory Visit: Payer: Self-pay | Admitting: Cardiology

## 2019-04-26 MED ORDER — METOPROLOL SUCCINATE ER 50 MG PO TB24: ORAL_TABLET | ORAL | 3 refills | Status: DC

## 2019-05-20 NOTE — Progress Notes (Signed)
Virtual Visit via Telephone Note   This visit type was conducted due to national recommendations for restrictions regarding the COVID-19 Pandemic (e.g. social distancing) in an effort to limit this patient's exposure and mitigate transmission in our community.  Due to his co-morbid illnesses, this patient is at least at moderate risk for complications without adequate follow up.  This format is felt to be most appropriate for this patient at this time.  The patient did not have access to video technology/had technical difficulties with video requiring transitioning to audio format only (telephone).  All issues noted in this document were discussed and addressed.  No physical exam could be performed with this format.  Please refer to the patient's chart for his  consent to telehealth for [facility].  Evaluation Performed:  Follow-up visit  This visit type was conducted due to national recommendations for restrictions regarding the COVID-19 Pandemic (e.g. social distancing).  This format is felt to be most appropriate for this patient at this time.  All issues noted in this document were discussed and addressed.  No physical exam was performed (except for noted visual exam findings with Video Visits).  Please refer to the patient's chart (MyChart message for video visits and phone note for telephone visits) for the patient's consent to telehealth for [facility]  Date:  05/21/2019   ID:  [name] Fetters., DOB 09/09/1940, MRN [id number]  Patient Location:  [place] [address]   Provider location:      [address] Office [phone] Fax [phone]  PCP:  Derinda Late, MD  Cardiologist:  Peter Martinique, MD  Electrophysiologist:  None   Chief Complaint: Follow-up  History of Present Illness:    [name] Lapenta. is a 79 y.o. male who presents via audio/video conferencing for a telehealth visit today.   Patient verified DOB and address.  The patient does not symptoms concerning for COVID-19 infection (fever, chills, cough, or new SHORTNESS OF BREATH).   [name] is a past medical history of atrial fibrillation, chronic obstructive asthma, OSA on CPAP, GERD, Barrett's esophagus, and BPH.  His atrial fibrillation dates back to 2012.  It was initially paroxysmal however, multiple EKGs since that time have showed persistent atrial fibrillation with rate control.  His atrial fibrillation is managed with metoprolol, diltiazem, and Pradaxa.  When he was seen by [name] on 01/23/2019 he stated that he was no longer going to the gym but was walking regularly at home and doing some light weight training.  He denied chest pain, shortness of breath, and palpitations.  He also denied dizziness and lightheadedness and was still compliant with his CPAP.  He is seen virtually today for follow-up and states he had one episode of racing heart rate after his oral surgery on 05/16/19. He states that since that time he has not had any further episodes. He has continued his exercise routine and heart healthy diet.  He is compliant with his medications and his CPAP.  Today he denies chest pain, shortness of breath, lower extremity edema, fatigue, palpitations, melena, hematuria, hemoptysis, diaphoresis, weakness, presyncope, syncope, orthopnea, and PND.   Prior CV studies:   The following studies were reviewed today:  EKG 01/28/2019 Atrial fibrillation left axis deviation 81 bpm  Echocardiogram 08/02/2010 Study Conclusions   - Left ventricle: The cavity size was normal. Wall thickness was  normal. Systolic function was normal. The estimated ejection  fraction was in the range of 55% to 65%. Wall motion was normal;  there were no regional wall motion abnormalities. Doppler  parameters are consistent with abnormal left ventricular  relaxation (grade 1 diastolic dysfunction).  - Mitral valve: Mild  regurgitation.  - Left atrium: The atrium was mildly dilated.  - Atrial septum: No defect or patent foramen ovale was identified.    Past Medical History:  Diagnosis Date  . Allergy   . Arrhythmia   . Atrial fibrillation ([place])   . Barrett esophagus   . Barrett's esophagus   . Blood transfusion without reported diagnosis   . BPH (benign prostatic hyperplasia)   . Cervical stenosis of spine   . Chronic obstructive asthma   . Difficult intubation 13 or 14 years ago, at [facility] long   airway was up near nostrils with intubation per patient with bowel surgery  . ED (erectile dysfunction)   . GERD (gastroesophageal reflux disease)   . Hammer toe of right foot   . Hiatal hernia   . Hyperlipidemia   . Hypertension   . OSA (obstructive sleep apnea) 08/05/10 [facility]   AHI 24  . Paroxysmal atrial fibrillation ([place])    Hospitalized on 05/27/10  . Renal cell carcinoma 2009   right renal mass, followed by Dr. Ander Slade  . Sleep apnea    Past Surgical History:  Procedure Laterality Date  . BASAL CELL CARCINOMA EXCISION    . BIOPSY  12/18/2017   Procedure: BIOPSY;  Surgeon: [name], MD;  Location: [facility];  Service: Endoscopy;;  . ESOPHAGOGASTRODUODENOSCOPY (EGD) WITH PROPOFOL N/A 08/26/2014   Procedure: ESOPHAGOGASTRODUODENOSCOPY (EGD) WITH PROPOFOL;  Surgeon: [name], MD;  Location: [facility] ENDOSCOPY;  Service: Endoscopy;  Laterality: N/A;  . ESOPHAGOGASTRODUODENOSCOPY (EGD) WITH PROPOFOL N/A 12/18/2017   Procedure: ESOPHAGOGASTRODUODENOSCOPY (EGD) WITH PROPOFOL;  Surgeon: [name], MD;  Location: [facility] ENDOSCOPY;  Service: Endoscopy;  Laterality: N/A;  . EXPLORATORY LAPAROTOMY W/ BOWEL RESECTION  12/1999   segmental sbr of a hemangioma, repair of incarcerated umbilical hernia  . exploratory laparotomy, segmental small bowel resection of a hemangioma, repair of incarcerated umbelical hernia  [id number]  . EYE SURGERY     cataracts done  . renal cancer surgery with RF  Ablation    . TONSILLECTOMY       Current Meds  Medication Sig  . alfuzosin (UROXATRAL) 10 MG 24 hr tablet Take 1 tablet by mouth daily.  . AMBULATORY NON FORMULARY MEDICATION CPAP at night  . AMBULATORY NON FORMULARY MEDICATION UDO's Oil 3-6-9 Blend Take 1 tbsp every day  . AMBULATORY NON FORMULARY MEDICATION Cardio Protegen Liquid 2 tbsp every morning  . AMBULATORY NON FORMULARY MEDICATION Carnivora  Take 1 tablet twice a day  . Apoaequorin (PREVAGEN PO) Take 1 tablet by mouth daily.   . dabigatran (PRADAXA) 150 MG CAPS capsule Take 1 capsule (150 mg total) by mouth 2 (two) times daily.  Marland Kitchen diltiazem (CARTIA XT) 180 MG 24 hr capsule Take 1 capsule (180 mg total) by mouth daily.  Marland Kitchen donepezil (ARICEPT) 5 MG tablet   . dutasteride (AVODART) 0.5 MG capsule Take 0.5 mg by mouth daily.   . fluticasone (FLONASE) 50 MCG/ACT nasal spray 2 sprays by Each Nare route daily for 30 days.  . metoprolol succinate (TOPROL-XL) 50 MG 24 hr tablet TAKE 1 TABLET BY MOUTH EVERY DAY WITH OR FOLLOWING A MEAL  . montelukast (SINGULAIR) 10 MG tablet TAKE 1 TABLET(10 MG) BY MOUTH AT BEDTIME  .  omeprazole (PRILOSEC) 20 MG capsule TAKE ONE CAPSULE BY MOUTH EVERY DAY  . OVER THE COUNTER MEDICATION Relaxium Sleep, One capsule before bedtime.  . Polyethylene Glycol 3350 (MIRALAX PO) Take by mouth.  . pravastatin (PRAVACHOL) 20 MG tablet Take 20 mg daily by mouth.  . tadalafil (CIALIS) 5 MG tablet TK 1 T PO D  . UNABLE TO FIND Place into the nose.     Allergies:   Tamsulosin and Proair hfa [albuterol]   Social History   Tobacco Use  . Smoking status: Former Smoker    Years: 10.00    Types: Cigars    Quit date: 02/29/1980    Years since quitting: 39.2  . Smokeless tobacco: Never Used  . Tobacco comment: quit when he was 79 years old  Substance Use Topics  . Alcohol use: No  . Drug use: No     Family Hx: The patient's family history includes Asthma in his paternal grandmother; Diabetes in his brother,  father, and mother; Heart attack in his paternal grandfather; Kidney cancer in his paternal aunt; Obesity in his brother; Stroke in his mother. There is no history of Colon cancer, Esophageal cancer, Rectal cancer, or Stomach cancer.  ROS:   Please see the history of present illness.     All other systems reviewed and are negative.   Labs/Other Tests and Data Reviewed:    Recent Labs: 08/29/2018: ALT 10; BUN 19; Creatinine, Ser 1.28; Potassium 4.7; Sodium 139   Recent Lipid Panel Lab Results  Component Value Date/Time   CHOL 124 11/08/2017 08:11 AM   TRIG 56 11/08/2017 08:11 AM   HDL 49 11/08/2017 08:11 AM   CHOLHDL 2.5 11/08/2017 08:11 AM   CHOLHDL 3.3 11/19/2014 08:20 AM   LDLCALC 64 11/08/2017 08:11 AM    Wt Readings from Last 3 Encounters:  05/21/19 172 lb (78 kg)  01/23/19 177 lb 6.4 oz (80.5 kg)  01/18/18 190 lb 3.2 oz (86.3 kg)     Exam:    Vital Signs:  Ht 5\' 10"  (1.778 m)   Wt 172 lb (78 kg)   BMI 24.68 kg/m    Well nourished, well developed male in no  acute distress.   ASSESSMENT & PLAN:    1.  Atrial fibrillation-heart rate today unable to obtain.    No increased work of breathing or sensation of palpitations. 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  Has remained at baseline Continue to monitor  Disposition: Follow-up with [name] in November.  COVID-19 Education: The signs and symptoms of COVID-19 were discussed with the patient and how to seek care for testing (follow up with PCP or arrange E-visit).  The importance of social distancing was discussed today.  Patient Risk:   After full review  of this patients clinical status, I feel that they are at least moderate risk at this time.  Time:   Today, I have spent 15 minutes with the patient with telehealth technology discussing atrial fibrillation, medication, diet, exercise.     Medication Adjustments/Labs and Tests Ordered: Current medicines are reviewed at length with the patient today.  Concerns regarding medicines are outlined above.   Tests Ordered: No orders of the defined types were  placed in this encounter.  Medication Changes: No orders of the defined types were placed in this encounter.   Disposition:  in 8 month(s)  Signed,  Jossie Ng. [facility] [address] [address] Office [phone] Fax [phone]

## 2019-05-21 ENCOUNTER — Telehealth (INDEPENDENT_AMBULATORY_CARE_PROVIDER_SITE_OTHER): Payer: Medicare PPO | Admitting: General Practice

## 2019-05-21 VITALS — Ht 70.0 in | Wt 172.0 lb

## 2019-05-21 DIAGNOSIS — Z7189 Other specified counseling: Secondary | ICD-10-CM | POA: Diagnosis not present

## 2019-05-21 DIAGNOSIS — E785 Hyperlipidemia, unspecified: Secondary | ICD-10-CM | POA: Diagnosis not present

## 2019-05-21 DIAGNOSIS — N182 Chronic kidney disease, stage 2 (mild): Secondary | ICD-10-CM

## 2019-05-21 DIAGNOSIS — I482 Chronic atrial fibrillation, unspecified: Secondary | ICD-10-CM

## 2019-05-21 DIAGNOSIS — I4819 Other persistent atrial fibrillation: Secondary | ICD-10-CM

## 2019-05-21 DIAGNOSIS — Z87891 Personal history of nicotine dependence: Secondary | ICD-10-CM

## 2019-05-21 NOTE — Patient Instructions (Addendum)
Medication Instructions:  The current medical regimen is effective;  continue present plan and medications as directed. Please refer to the Current Medication list given to you today.  *If you need a refill on your cardiac medications before your next appointment, please call your pharmacy*  Follow-Up: Your next appointment:  NOV 2021, Please call in SEPT 2021 to schedule this NOV 2021 appointment.  In Person with You may see Peter Martinique, MD Coletta Memos, FNP-C or one of the following Advanced Practice Providers on your designated Care Team:  Almyra Deforest, PA-C Fabian Sharp, [place] or Roby Lofts, PA-C  At [facility], you and your health needs are our priority.  As part of our continuing mission to provide you with exceptional heart care, we have created designated Provider Care Teams.  These Care Teams include your primary Cardiologist (physician) and Advanced Practice Providers (APPs -  Physician Assistants and Nurse Practitioners) who all work together to provide you with the care you need, when you need it.  We recommend signing up for the patient portal called "MyChart".  Sign up information is provided on this After Visit Summary.  MyChart is used to connect with patients for Virtual Visits (Telemedicine).  Patients are able to view lab/test results, encounter notes, upcoming appointments, etc.  Non-urgent messages can be sent to your provider as well.   To learn more about what you can do with MyChart, go to [email or web address].

## 2019-05-29 ENCOUNTER — Encounter: Payer: Self-pay | Admitting: Podiatry

## 2019-05-29 ENCOUNTER — Ambulatory Visit: Payer: Medicare PPO | Admitting: Podiatry

## 2019-05-29 ENCOUNTER — Other Ambulatory Visit: Payer: Self-pay

## 2019-05-29 DIAGNOSIS — D689 Coagulation defect, unspecified: Secondary | ICD-10-CM | POA: Insufficient documentation

## 2019-05-29 DIAGNOSIS — B351 Tinea unguium: Secondary | ICD-10-CM

## 2019-05-29 DIAGNOSIS — M21621 Bunionette of right foot: Secondary | ICD-10-CM

## 2019-05-29 DIAGNOSIS — M79674 Pain in right toe(s): Secondary | ICD-10-CM

## 2019-05-29 DIAGNOSIS — M79675 Pain in left toe(s): Secondary | ICD-10-CM

## 2019-05-29 NOTE — Progress Notes (Signed)
This patient returns to my office for at risk foot care.  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  Patient is taking pradaxa. This patient is unable to cut nails himself since the patient cannot reach his nails.These nails are painful walking and wearing shoes.  This patient presents for at risk foot care today.  General Appearance  Alert, conversant and in no acute stress.  Vascular  Dorsalis pedis and posterior tibial  pulses are palpable  bilaterally.  Capillary return is within normal limits  bilaterally. Temperature is within normal limits  bilaterally.  Neurologic  Senn-Weinstein monofilament wire test within normal limits  bilaterally. Muscle power within normal limits bilaterally.  Nails Thick disfigured discolored nails with subungual debris  from hallux to fifth toes bilaterally. No evidence of bacterial infection or drainage bilaterally.  Orthopedic  No limitations of motion  feet .  No crepitus or effusions noted.  No bony pathology or digital deformities noted.  Skin  normotropic skin with no porokeratosis noted bilaterally.  No signs of infections or ulcers noted.     Onychomycosis  Pain in right toes  Pain in left toes  Consent was obtained for treatment procedures.   Mechanical debridement of nails 1-5  bilaterally performed with a nail nipper.  Filed with dremel without incident. Patient is concerned about his leg swelling.   Return office visit   3 months.                  Told patient to return for periodic foot care and evaluation due to potential at risk complications.   Gardiner Barefoot DPM

## 2019-07-04 ENCOUNTER — Other Ambulatory Visit: Payer: Self-pay | Admitting: Family Medicine

## 2019-07-04 ENCOUNTER — Ambulatory Visit
Admission: RE | Admit: 2019-07-04 | Discharge: 2019-07-04 | Disposition: A | Discharge status: Home / Self Care | Payer: Medicare PPO | Source: Ambulatory Visit | Attending: Family Medicine | Admitting: Family Medicine

## 2019-07-04 ENCOUNTER — Other Ambulatory Visit: Payer: Self-pay

## 2019-07-04 DIAGNOSIS — R0789 Other chest pain: Secondary | ICD-10-CM

## 2019-07-17 ENCOUNTER — Telehealth: Payer: Self-pay | Admitting: Cardiology

## 2019-07-17 ENCOUNTER — Telehealth: Payer: Self-pay

## 2019-07-17 NOTE — Telephone Encounter (Signed)
New Message    Junie Panning is calling from [facility], she says she is returning a call to a nurse about information they sent over     Please advise

## 2019-07-17 NOTE — Telephone Encounter (Signed)
Spoke to [place] with [facility] she was calling to see if we received a clearance for pt to stop pradaxa for a procedure scheduled 5/25.Advised we did not receive clearance.She will refax to fax # [phone].

## 2019-07-17 NOTE — Telephone Encounter (Signed)
   [facility] Pre-operative Risk Assessment    HEARTCARE STAFF: - Please ensure there is not already an duplicate clearance open for this procedure - Under Visit Info/Reason for Call, type in Other and utilize the format Clearance MM/DD/YY or Clearance TBD  Request for surgical clearance:  1. What type of surgery is being performed? CT guided perc. biopsy   2. When is this surgery scheduled? 07/23/19  3. What type of clearance is required (medical clearance vs. Pharmacy clearance to hold med vs. Both)? Pharmacy  4. Are there any medications that need to be held prior to surgery and how long? Pradaxa requesting to hold 1 week before   5. Practice name and name of physician performing surgery? [facility] not listed  6. What is the office phone number? [phone] Attention Ortencia Kick   7.   What is the office fax number? [phone]  8.   Anesthesia type not listed   Kathyrn Lass 07/17/2019, 3:02 PM  _________________________________________________________________   (provider comments below)

## 2019-07-17 NOTE — Telephone Encounter (Signed)
Received clearance.Clearance sent to preop pool.

## 2019-07-18 NOTE — Telephone Encounter (Signed)
Patient with diagnosis of afib on Pradaxa for anticoagulation.    Procedure: CT guided biopsy Date of procedure: 07/23/19  CHADS2-VASc score of 3 (age x2, HTN)  CrCl 87mL/min Platelet count 198K  7 day requested hold is excessive. Would recommend holding Pradaxa for a max of 3-4 days prior to procedure due to mildly reduced renal function.

## 2019-07-18 NOTE — Telephone Encounter (Signed)
   Primary Cardiologist: Peter Martinique, MD  Chart reviewed as part of pre-operative protocol coverage. Given past medical history and time since last visit, based on ACC/AHA guidelines, [name]. would be at acceptable risk for the planned procedure without further cardiovascular testing.   Per pharmacy:  Patient with diagnosis of afib on Pradaxa for anticoagulation.    Procedure: CT guided biopsy Date of procedure: 07/23/19  CHADS2-VASc score of 3 (age x2, HTN)  CrCl 65mL/min Platelet count 198K  7 day requested hold is excessive. Would recommend holding Pradaxa for a max of 3-4 days prior to procedure due to mildly reduced renal function.  I will route this recommendation to the requesting party via Epic fax function and remove from pre-op pool.  Please call with questions.  Kathyrn Drown, NP 07/18/2019, 11:00 AM

## 2019-08-02 ENCOUNTER — Other Ambulatory Visit: Payer: Self-pay

## 2019-08-02 ENCOUNTER — Ambulatory Visit (HOSPITAL_COMMUNITY)
Admission: RE | Admit: 2019-08-02 | Discharge: 2019-08-02 | Disposition: A | Discharge status: Home / Self Care | Payer: Medicare PPO | Source: Ambulatory Visit | Attending: Nurse Practitioner | Admitting: Nurse Practitioner

## 2019-08-02 ENCOUNTER — Encounter (HOSPITAL_COMMUNITY): Payer: Self-pay | Admitting: Nurse Practitioner

## 2019-08-02 ENCOUNTER — Telehealth: Payer: Self-pay | Admitting: Cardiology

## 2019-08-02 VITALS — BP 146/80 | HR 93 | Ht 70.0 in | Wt 184.6 lb

## 2019-08-02 DIAGNOSIS — D6869 Other thrombophilia: Secondary | ICD-10-CM | POA: Diagnosis not present

## 2019-08-02 DIAGNOSIS — Z823 Family history of stroke: Secondary | ICD-10-CM | POA: Insufficient documentation

## 2019-08-02 DIAGNOSIS — Z8249 Family history of ischemic heart disease and other diseases of the circulatory system: Secondary | ICD-10-CM | POA: Diagnosis not present

## 2019-08-02 DIAGNOSIS — Z888 Allergy status to other drugs, medicaments and biological substances status: Secondary | ICD-10-CM | POA: Insufficient documentation

## 2019-08-02 DIAGNOSIS — C9000 Multiple myeloma not having achieved remission: Secondary | ICD-10-CM | POA: Insufficient documentation

## 2019-08-02 DIAGNOSIS — I4891 Unspecified atrial fibrillation: Secondary | ICD-10-CM | POA: Diagnosis present

## 2019-08-02 DIAGNOSIS — I4821 Permanent atrial fibrillation: Secondary | ICD-10-CM | POA: Diagnosis not present

## 2019-08-02 DIAGNOSIS — I482 Chronic atrial fibrillation, unspecified: Secondary | ICD-10-CM | POA: Diagnosis not present

## 2019-08-02 DIAGNOSIS — Z85528 Personal history of other malignant neoplasm of kidney: Secondary | ICD-10-CM | POA: Diagnosis not present

## 2019-08-02 DIAGNOSIS — Z87891 Personal history of nicotine dependence: Secondary | ICD-10-CM | POA: Insufficient documentation

## 2019-08-02 DIAGNOSIS — I10 Essential (primary) hypertension: Secondary | ICD-10-CM | POA: Diagnosis not present

## 2019-08-02 DIAGNOSIS — Z79899 Other long term (current) drug therapy: Secondary | ICD-10-CM | POA: Diagnosis not present

## 2019-08-02 DIAGNOSIS — Z7901 Long term (current) use of anticoagulants: Secondary | ICD-10-CM | POA: Diagnosis not present

## 2019-08-02 DIAGNOSIS — K219 Gastro-esophageal reflux disease without esophagitis: Secondary | ICD-10-CM | POA: Diagnosis not present

## 2019-08-02 DIAGNOSIS — J449 Chronic obstructive pulmonary disease, unspecified: Secondary | ICD-10-CM | POA: Insufficient documentation

## 2019-08-02 DIAGNOSIS — E785 Hyperlipidemia, unspecified: Secondary | ICD-10-CM | POA: Insufficient documentation

## 2019-08-02 DIAGNOSIS — Z833 Family history of diabetes mellitus: Secondary | ICD-10-CM | POA: Insufficient documentation

## 2019-08-02 MED ORDER — DILTIAZEM HCL ER COATED BEADS 240 MG PO CP24: 240.0000 mg | ORAL_CAPSULE | Freq: Every day | ORAL | 3 refills | Status: DC

## 2019-08-02 NOTE — Telephone Encounter (Signed)
Received call from patient c/o elevated HR since last night.   States he has had a couple of episodes the last 2-3 days but this episode has been occurring since last night.   HR last night 180-181.  He reports mild lightheadedness and dizziness, denies SOB/CP/palpitations.     Patient unsure what HR is now as he checks it on his phone and cannot do so while talking on it.    Disconnected with patient so he could check current HR.   HR 162 at current.  Took BP while on the phone 112/91.     Hx: PAF?/Persistent Afib since 2012.   On Toprol XL 50 mg, diltiazem 180 mg and Pradaxa.  Last VV 05/21/19  Advised with HR elevated for extended time and symptoms, ER would be appropriate for evaluation and treatment.   Patient does not want to go to ER.    Spoke to [name] in office, advised to take additional Toprol XL 50 mg and OV asap (no appts today),  If HR remains elevated, go to ER.   Per [name], ok to see Afib clinic as well.    Discussed with patient, aware of recommendations.   He took additional Toprol XL now and appt scheduled with Afib clinic at 10am.   Directions and code provided, wife will be driving him.

## 2019-08-02 NOTE — Progress Notes (Signed)
Primary Care Physician: Derinda Late, MD Referring Physician: NL triage  Cardiologist: [name]    [name] F Ende Jr. is a 79 y.o. male with a h/o afib dating back to 2012 that has been persistent/chronic. He called to NL this am stating that his afib by his [name] is showing increased v rates up to 160 bpm. He was told to take an extra toprol  xl 50 mg this am and was referred here. Now his ekg shows afib at 93 bpm. He was told yesterday after several tests, that he has Multiple Myeloma at [facility] and he will possibly require chemotherapy.   He goes  next week to get a PET Scan and hear more about the staging  treatment plan. He has h/o prior renal  He also said that he just got back form [facility] for a few days. He uses cpap at home  but did not take on his trip. I reviewed his Kardia Strip. It did show a  short burst of afib up to 160 bpm but this was not sustained.   He is planning to go to [facility] this Sunday and return Wednesday. I encouraged him to  take his cpap with him. Other than the recent CA DX, nothing else is changed with his health. He continues on Praxa with CHA2DS2VASc of at least 2. He denies caffeine, alcohol or tobacco use.   Today, he denies symptoms of palpitations, chest pain, shortness of breath, orthopnea, PND, lower extremity edema, dizziness, presyncope, syncope, or neurologic sequela. The patient is tolerating medications without difficulties and is otherwise without complaint today.   Past Medical History:  Diagnosis Date  . Allergy   . Arrhythmia   . Atrial fibrillation ([place])   . Barrett esophagus   . Barrett's esophagus   . Blood transfusion without reported diagnosis   . BPH (benign prostatic hyperplasia)   . Cervical stenosis of spine   . Chronic obstructive asthma   . Difficult intubation 13 or 14 years ago, at [facility] long   airway was up near nostrils with intubation per patient with bowel surgery  . ED (erectile  dysfunction)   . GERD (gastroesophageal reflux disease)   . Hammer toe of right foot   . Hiatal hernia   . Hyperlipidemia   . Hypertension   . OSA (obstructive sleep apnea) 08/05/10 PS[facility]   AHI 24  . Paroxysmal atrial fibrillation ([place])    Hospitalized on 05/27/10  . Renal cell carcinoma 2009   right renal mass, followed by Dr. Ander Slade  . Sleep apnea    Past Surgical History:  Procedure Laterality Date  . BASAL CELL CARCINOMA EXCISION    . BIOPSY  12/18/2017   Procedure: BIOPSY;  Surgeon: [name], MD;  Location: [facility];  Service: Endoscopy;;  . ESOPHAGOGASTRODUODENOSCOPY (EGD) WITH PROPOFOL N/A 08/26/2014   Procedure: ESOPHAGOGASTRODUODENOSCOPY (EGD) WITH PROPOFOL;  Surgeon: [name], MD;  [facility]: [facility];  Service: Endoscopy;  Laterality: N/A;  . ESOPHAGOGASTRODUODENOSCOPY (EGD) WITH PROPOFOL N/A 12/18/2017   Procedure: ESOPHAGOGASTRODUODENOSCOPY (EGD) WITH PROPOFOL;  Surgeon: [name], MD;  [facility];  Service: Endoscopy;  Laterality: N/A;  . EXPLORATORY LAPAROTOMY W/ BOWEL RESECTION  12/1999   segmental sbr of a hemangioma, repair of incarcerated umbilical hernia  . exploratory laparotomy, segmental small bowel resection of a hemangioma, repair of incarcerated umbelical hernia  68/1275  . EYE SURGERY     cataracts done  . renal cancer  surgery with RF Ablation    . TONSILLECTOMY      Current Outpatient Medications  Medication Sig Dispense Refill  . alfuzosin (UROXATRAL) 10 MG 24 hr tablet Take 1 tablet by mouth daily.    . AMBULATORY NON FORMULARY MEDICATION CPAP at night    . AMBULATORY NON FORMULARY MEDICATION Carnivora  Take 1 tablet twice a day    . dabigatran (PRADAXA) 150 MG CAPS capsule Take 1 capsule (150 mg total) by mouth 2 (two) times daily. 60 capsule 4  . diltiazem (CARTIA XT) 240 MG 24 hr capsule Take 1 capsule (240 mg total) by mouth daily. 30 capsule 3  . donepezil (ARICEPT) 10 MG tablet Take 10 mg by mouth  daily.     Marland Kitchen dutasteride (AVODART) 0.5 MG capsule Take 0.5 mg by mouth daily.   11  . fluticasone (FLONASE) 50 MCG/ACT nasal spray Place 2 sprays into both nostrils as needed.     Marland Kitchen LORazepam (ATIVAN) 0.5 MG tablet Take 0.5 mg by mouth 2 (two) times daily.     . memantine (NAMENDA) 5 MG tablet Take 5 mg by mouth daily.     . metoprolol succinate (TOPROL-XL) 50 MG 24 hr tablet TAKE 1 TABLET BY MOUTH EVERY DAY WITH OR FOLLOWING A MEAL 90 tablet 3  . montelukast (SINGULAIR) 10 MG tablet TAKE 1 TABLET(10 MG) BY MOUTH AT BEDTIME 90 tablet 2  . omeprazole (PRILOSEC) 20 MG capsule TAKE ONE CAPSULE BY MOUTH EVERY DAY 30 capsule 0  . polyethylene glycol (MIRALAX) 17 g packet Take by mouth daily as needed.     . pravastatin (PRAVACHOL) 20 MG tablet Take 20 mg daily by mouth.    . tadalafil (CIALIS) 5 MG tablet Take 5 mg by mouth as needed.      No current facility-administered medications for this encounter.    Allergies  Allergen Reactions  . Tamsulosin Other (See Comments)    dizziness  . Proair Hfa [Albuterol] Palpitations    Only use in emergency due to has heart problems    Social History   Socioeconomic History  . Marital status: Divorced    Spouse name: Not on file  . Number of children: 4  . Years of education: Not on file  . Highest education level: Not on file  Occupational History  . Occupation: Engineer, manufacturing  . Occupation: Warehouse manager  Tobacco Use  . Smoking status: Former Smoker    Years: 10.00    Types: Cigars    Quit date: 02/29/1980    Years since quitting: 39.4  . Smokeless tobacco: Never Used  . Tobacco comment: quit when he was 79 years old  Substance and Sexual Activity  . Alcohol use: No  . Drug use: No  . Sexual activity: Not Currently  Other Topics Concern  . Not on file  Social History Narrative   ** Merged History Encounter ** Married. Education: [employer]. Exercise: Eleptical times a week for 45 minutes.          Social Determinants  of Health   Financial Resource Strain:   . Difficulty of Paying Living Expenses:   Food Insecurity:   . Worried About Charity fundraiser in the Last Year:   . Arboriculturist in the Last Year:   Transportation Needs:   . Film/video editor (Medical):   Marland Kitchen Lack of Transportation (Non-Medical):   Physical Activity:   . Days of Exercise per Week:   . Minutes of Exercise  per Session:   Stress:   . Feeling of Stress :   Social Connections:   . Frequency of Communication with Friends and Family:   . Frequency of Social Gatherings with Friends and Family:   . Attends Religious Services:   . Active Member of Clubs or Organizations:   . Attends Archivist Meetings:   Marland Kitchen Marital Status:   Intimate Partner Violence:   . Fear of Current or Ex-Partner:   . Emotionally Abused:   Marland Kitchen Physically Abused:   . Sexually Abused:     Family History  Problem Relation Age of Onset  . Stroke Mother   . Diabetes Mother   . Obesity Brother   . Diabetes Brother   . Diabetes Father   . Asthma Paternal Grandmother   . Heart attack Paternal Grandfather   . Kidney cancer Paternal Aunt   . Colon cancer Neg Hx   . Esophageal cancer Neg Hx   . Rectal cancer Neg Hx   . Stomach cancer Neg Hx     ROS- All systems are reviewed and negative except as per the HPI above  Physical Exam: Vitals:   08/02/19 1019  BP: (!) 146/80  Pulse: 93  Weight: 83.7 kg  Height: '5\' 10"'$  (1.778 m)   Wt Readings from Last 3 Encounters:  08/02/19 83.7 kg  05/21/19 78 kg  01/23/19 80.5 kg    Labs: Lab Results  Component Value Date   NA 139 08/29/2018   K 4.7 08/29/2018   CL 101 08/29/2018   CO2 25 08/29/2018   GLUCOSE 80 08/29/2018   BUN 19 08/29/2018   CREATININE 1.28 (H) 08/29/2018   CALCIUM 9.4 08/29/2018   MG 2.2 05/18/2011   Lab Results  Component Value Date   INR 1.17 05/18/2011   Lab Results  Component Value Date   CHOL 124 11/08/2017   HDL 49 11/08/2017   LDLCALC 64 11/08/2017    TRIG 56 11/08/2017     GEN- The patient is well appearing, alert and oriented x 3 today.   Head- normocephalic, atraumatic Eyes-  Sclera clear, conjunctiva pink Ears- hearing intact Oropharynx- clear Neck- supple, no JVP Lymph- no cervical lymphadenopathy Lungs- Clear to ausculation bilaterally, normal work of breathing Heart- irregular rate and rhythm, no murmurs, rubs or gallops, PMI not laterally displaced GI- soft, NT, ND, + BS Extremities- no clubbing, cyanosis, or edema MS- no significant deformity or atrophy Skin- no rash or lesion Psych- euthymic mood, full affect Neuro- strength and sensation are intact  EKG-afib at at 93 bpm, qrs int 84 bpm, qtc 450 ms  Labs from Care Everywhere- 08/01/2019 07/23/2019 06/12/2017   3.8 (L) 2.5 (L) 5.3  3.94 (L) 3.60 (L) 4.35 (L)  12.6 (L) 11.7 (L) 13.5 (L)  36.8 (L) 33.4 (L) 39.4 (L)     Assessment and Plan: 1. Permanent  afib Usually rate controlled but with increased v rate over the   last few days  Recent  CA dx may  be contributing  I will increase cardizem to 240 mg qd to start tomorrow as he had extra BB today He was encouraged to take cpap on his upcoming beach trip as not to aggravate afib  He can continue to follow with his Kardia   2. CHA2DS2VASc score of at least 2 Continue 150 mg bid   3. Newly dx Multiple Myloma  F/u with [facility] next week for further staging  and treatment plan   I will see back  next Thursday   Mileah Hemmer C. Jeiry Birnbaum, [facility] [address] [place], [place] [address] [phone]

## 2019-08-02 NOTE — Telephone Encounter (Signed)
Patient c/o Palpitations:  High priority if patient c/o lightheadedness, shortness of breath, or chest pain  1) How long have you had palpitations/irregular HR/ Afib? Are you having the symptoms now? Afib. Started Wednesday 07/31/19 off and on. Having the symptoms now and they are continuous.   2) Are you currently experiencing lightheadedness, SOB or CP? Lightheadedness   3) Do you have a history of afib (atrial fibrillation) or irregular heart rhythm? afib   4) Have you checked your BP or HR? (document readings if available): HR 180-181  5) Are you experiencing any other symptoms? No

## 2019-08-02 NOTE — Patient Instructions (Signed)
Increase cardizem to 240mg  once a day starting tomorrow

## 2019-08-08 ENCOUNTER — Ambulatory Visit (HOSPITAL_COMMUNITY)
Admission: RE | Admit: 2019-08-08 | Discharge: 2019-08-08 | Disposition: A | Discharge status: Home / Self Care | Payer: Medicare PPO | Source: Ambulatory Visit | Attending: Nurse Practitioner | Admitting: Nurse Practitioner

## 2019-08-08 ENCOUNTER — Other Ambulatory Visit: Payer: Self-pay

## 2019-08-08 VITALS — BP 110/54 | HR 58 | Ht 70.0 in | Wt 189.0 lb

## 2019-08-08 DIAGNOSIS — I482 Chronic atrial fibrillation, unspecified: Secondary | ICD-10-CM

## 2019-08-08 DIAGNOSIS — Z888 Allergy status to other drugs, medicaments and biological substances status: Secondary | ICD-10-CM | POA: Diagnosis not present

## 2019-08-08 DIAGNOSIS — C9000 Multiple myeloma not having achieved remission: Secondary | ICD-10-CM | POA: Diagnosis not present

## 2019-08-08 DIAGNOSIS — I10 Essential (primary) hypertension: Secondary | ICD-10-CM | POA: Insufficient documentation

## 2019-08-08 DIAGNOSIS — Z823 Family history of stroke: Secondary | ICD-10-CM | POA: Insufficient documentation

## 2019-08-08 DIAGNOSIS — Z8249 Family history of ischemic heart disease and other diseases of the circulatory system: Secondary | ICD-10-CM | POA: Diagnosis not present

## 2019-08-08 DIAGNOSIS — I4821 Permanent atrial fibrillation: Secondary | ICD-10-CM | POA: Insufficient documentation

## 2019-08-08 DIAGNOSIS — E785 Hyperlipidemia, unspecified: Secondary | ICD-10-CM | POA: Insufficient documentation

## 2019-08-08 DIAGNOSIS — Z7901 Long term (current) use of anticoagulants: Secondary | ICD-10-CM | POA: Diagnosis not present

## 2019-08-08 DIAGNOSIS — D6869 Other thrombophilia: Secondary | ICD-10-CM | POA: Diagnosis not present

## 2019-08-08 DIAGNOSIS — Z79899 Other long term (current) drug therapy: Secondary | ICD-10-CM | POA: Diagnosis not present

## 2019-08-08 DIAGNOSIS — K219 Gastro-esophageal reflux disease without esophagitis: Secondary | ICD-10-CM | POA: Insufficient documentation

## 2019-08-08 DIAGNOSIS — Z85528 Personal history of other malignant neoplasm of kidney: Secondary | ICD-10-CM | POA: Diagnosis not present

## 2019-08-08 DIAGNOSIS — Z87891 Personal history of nicotine dependence: Secondary | ICD-10-CM | POA: Diagnosis not present

## 2019-08-08 DIAGNOSIS — Z833 Family history of diabetes mellitus: Secondary | ICD-10-CM | POA: Diagnosis not present

## 2019-08-08 DIAGNOSIS — G4733 Obstructive sleep apnea (adult) (pediatric): Secondary | ICD-10-CM | POA: Diagnosis not present

## 2019-08-08 DIAGNOSIS — I4891 Unspecified atrial fibrillation: Secondary | ICD-10-CM | POA: Diagnosis present

## 2019-08-08 NOTE — Progress Notes (Signed)
Primary Care Physician: Derinda Late, MD Referring Physician: NL triage  Cardiologist: [name]    Tramar F Linarez Jr. is a 79 y.o. male with a h/o afib dating back to 2012 that has been persistent/chronic. He called to NL this am stating that his afib by his [name] is showing increased v rates up to 160 bpm. He was told to take an extra toprol  xl 50 mg this am and was referred here. Now his ekg shows afib at 93 bpm. He was told yesterday after several tests, that he has Multiple Myeloma at [facility] and he will possibly require chemotherapy.   He goes  next week to get a PET Scan and hear more about the staging  treatment plan. He has h/o prior renal  He also said that he just got back form [facility] for a few days. He uses cpap at home  but did not take on his trip. I reviewed his Kardia Strip. It did show a  short burst of afib up to 160 bpm but this was not sustained.   He is planning to go to [facility] this Sunday and return Wednesday. I encouraged him to  take his cpap with him. Other than the recent CA DX, nothing else is changed with his health. He continues on Praxa with CHA2DS2VASc of at least 2. He denies caffeine, alcohol or tobacco use.   F/u in afib clinic, 6/10.  His ekg shows afib at 58 bpm on higher dose of Cardizem. He  has not had any further episodes of feeling like his HR is racing. Spent the last week  At the beach, had a great time. His weight is up, no appreciable  LLE, but he ate very well while there. He has f/u with the oncologist tomorrow to discuss the plan  to treat newly dx MM.   Today, he denies symptoms of palpitations, chest pain, shortness of breath, orthopnea, PND, lower extremity edema, dizziness, presyncope, syncope, or neurologic sequela. The patient is tolerating medications without difficulties and is otherwise without complaint today.   Past Medical History:  Diagnosis Date  . Allergy   . Arrhythmia   . Atrial fibrillation  ([place])   . Barrett esophagus   . Barrett's esophagus   . Blood transfusion without reported diagnosis   . BPH (benign prostatic hyperplasia)   . Cervical stenosis of spine   . Chronic obstructive asthma   . Difficult intubation 13 or 14 years ago, at [facility] long   airway was up near nostrils with intubation per patient with bowel surgery  . ED (erectile dysfunction)   . GERD (gastroesophageal reflux disease)   . Hammer toe of right foot   . Hiatal hernia   . Hyperlipidemia   . Hypertension   . OSA (obstructive sleep apnea) 08/05/10 PS[facility]   AHI 24  . Paroxysmal atrial fibrillation ([place])    Hospitalized on 05/27/10  . Renal cell carcinoma 2009   right renal mass, followed by Dr. Ander Slade  . Sleep apnea    Past Surgical History:  Procedure Laterality Date  . BASAL CELL CARCINOMA EXCISION    . BIOPSY  12/18/2017   Procedure: BIOPSY;  Surgeon: [name], MD;  Location: [facility];  Service: Endoscopy;;  . ESOPHAGOGASTRODUODENOSCOPY (EGD) WITH PROPOFOL N/A 08/26/2014   Procedure: ESOPHAGOGASTRODUODENOSCOPY (EGD) WITH PROPOFOL;  Surgeon: [name], MD;  [facility];  Service: Endoscopy;  Laterality: N/A;  . ESOPHAGOGASTRODUODENOSCOPY (EGD) WITH  PROPOFOL N/A 12/18/2017   Procedure: ESOPHAGOGASTRODUODENOSCOPY (EGD) WITH PROPOFOL;  Surgeon: [name], MD;  [facility];  Service: Endoscopy;  Laterality: N/A;  . EXPLORATORY LAPAROTOMY W/ BOWEL RESECTION  12/1999   segmental sbr of a hemangioma, repair of incarcerated umbilical hernia  . exploratory laparotomy, segmental small bowel resection of a hemangioma, repair of incarcerated umbelical hernia  26/3335  . EYE SURGERY     cataracts done  . renal cancer surgery with RF Ablation    . TONSILLECTOMY      Current Outpatient Medications  Medication Sig Dispense Refill  . alfuzosin (UROXATRAL) 10 MG 24 hr tablet Take 1 tablet by mouth daily.    . AMBULATORY NON FORMULARY MEDICATION CPAP at night      . AMBULATORY NON FORMULARY MEDICATION Carnivora  Take 1 tablet twice a day    . dabigatran (PRADAXA) 150 MG CAPS capsule Take 1 capsule (150 mg total) by mouth 2 (two) times daily. 60 capsule 4  . diltiazem (CARTIA XT) 240 MG 24 hr capsule Take 1 capsule (240 mg total) by mouth daily. 30 capsule 3  . donepezil (ARICEPT) 10 MG tablet Take 10 mg by mouth daily.     Marland Kitchen dutasteride (AVODART) 0.5 MG capsule Take 0.5 mg by mouth daily.   11  . fluticasone (FLONASE) 50 MCG/ACT nasal spray Place 2 sprays into both nostrils as needed.     Marland Kitchen LORazepam (ATIVAN) 0.5 MG tablet Take 0.5 mg by mouth 2 (two) times daily.     . memantine (NAMENDA) 5 MG tablet Take 5 mg by mouth daily.     . metoprolol succinate (TOPROL-XL) 50 MG 24 hr tablet TAKE 1 TABLET BY MOUTH EVERY DAY WITH OR FOLLOWING A MEAL 90 tablet 3  . montelukast (SINGULAIR) 10 MG tablet TAKE 1 TABLET(10 MG) BY MOUTH AT BEDTIME 90 tablet 2  . omeprazole (PRILOSEC) 20 MG capsule TAKE ONE CAPSULE BY MOUTH EVERY DAY 30 capsule 0  . polyethylene glycol (MIRALAX) 17 g packet Take by mouth daily as needed.     . pravastatin (PRAVACHOL) 20 MG tablet Take 20 mg daily by mouth.    . tadalafil (CIALIS) 5 MG tablet Take 5 mg by mouth as needed.      No current facility-administered medications for this encounter.    Allergies  Allergen Reactions  . Tamsulosin Other (See Comments)    dizziness  . Proair Hfa [Albuterol] Palpitations    Only use in emergency due to has heart problems    Social History   Socioeconomic History  . Marital status: Divorced    Spouse name: Not on file  . Number of children: 4  . Years of education: Not on file  . Highest education level: Not on file  Occupational History  . Occupation: Engineer, manufacturing  . Occupation: Warehouse manager  Tobacco Use  . Smoking status: Former Smoker    Years: 10.00    Types: Cigars    Quit date: 02/29/1980    Years since quitting: 39.4  . Smokeless tobacco: Never Used  .  Tobacco comment: quit when he was 79 years old  Vaping Use  . Vaping Use: Never used  Substance and Sexual Activity  . Alcohol use: No  . Drug use: No  . Sexual activity: Not Currently  Other Topics Concern  . Not on file  Social History Narrative   ** Merged History Encounter ** Married. Education: [employer]. Exercise: Eleptical times a week for 45 minutes.  Social Determinants of Health   Financial Resource Strain:   . Difficulty of Paying Living Expenses:   Food Insecurity:   . Worried About Charity fundraiser in the Last Year:   . Arboriculturist in the Last Year:   Transportation Needs:   . Film/video editor (Medical):   Marland Kitchen Lack of Transportation (Non-Medical):   Physical Activity:   . Days of Exercise per Week:   . Minutes of Exercise per Session:   Stress:   . Feeling of Stress :   Social Connections:   . Frequency of Communication with Friends and Family:   . Frequency of Social Gatherings with Friends and Family:   . Attends Religious Services:   . Active Member of Clubs or Organizations:   . Attends Archivist Meetings:   Marland Kitchen Marital Status:   Intimate Partner Violence:   . Fear of Current or Ex-Partner:   . Emotionally Abused:   Marland Kitchen Physically Abused:   . Sexually Abused:     Family History  Problem Relation Age of Onset  . Stroke Mother   . Diabetes Mother   . Obesity Brother   . Diabetes Brother   . Diabetes Father   . Asthma Paternal Grandmother   . Heart attack Paternal Grandfather   . Kidney cancer Paternal Aunt   . Colon cancer Neg Hx   . Esophageal cancer Neg Hx   . Rectal cancer Neg Hx   . Stomach cancer Neg Hx     ROS- All systems are reviewed and negative except as per the HPI above  Physical Exam: Vitals:   08/08/19 1132  BP: (!) 110/54  Pulse: (!) 58  Weight: 85.7 kg  Height: '5\' 10"'$  (1.778 m)   Wt Readings from Last 3 Encounters:  08/08/19 85.7 kg  08/02/19 83.7 kg  05/21/19 78 kg    Labs: Lab Results    Component Value Date   NA 139 08/29/2018   K 4.7 08/29/2018   CL 101 08/29/2018   CO2 25 08/29/2018   GLUCOSE 80 08/29/2018   BUN 19 08/29/2018   CREATININE 1.28 (H) 08/29/2018   CALCIUM 9.4 08/29/2018   MG 2.2 05/18/2011   Lab Results  Component Value Date   INR 1.17 05/18/2011   Lab Results  Component Value Date   CHOL 124 11/08/2017   HDL 49 11/08/2017   LDLCALC 64 11/08/2017   TRIG 56 11/08/2017     GEN- The patient is well appearing, alert and oriented x 3 today.   Head- normocephalic, atraumatic Eyes-  Sclera clear, conjunctiva pink Ears- hearing intact Oropharynx- clear Neck- supple, no JVP Lymph- no cervical lymphadenopathy Lungs- Clear to ausculation bilaterally, normal work of breathing Heart- irregular rate and rhythm, no murmurs, rubs or gallops, PMI not laterally displaced GI- soft, NT, ND, + BS Extremities- no clubbing, cyanosis, or edema MS- no significant deformity or atrophy Skin- no rash or lesion Psych- euthymic mood, full affect Neuro- strength and sensation are intact  EKG-afib at at 58 bpm, qrs int 84 bpm, qtc 450 ms  Labs from Care Everywhere- 08/01/2019 07/23/2019 06/12/2017   3.8 (L) 2.5 (L) 5.3  3.94 (L) 3.60 (L) 4.35 (L)  12.6 (L) 11.7 (L) 13.5 (L)  36.8 (L) 33.4 (L) 39.4 (L)     Assessment and Plan: 1. Permanent  afib Usually rate controlled but with increased v rates, up t0 160 bpm, but not sustained, noted last week  days , and I  reviewed on his [place] dx may have   be contributed He did not check Kardia at the beach as he was having a great time and did not feel anything out of the usual  Continue cpap for tx of OSA He can continue to follow with his Kardia   2. CHA2DS2VASc score of at least 2 Continue  pradaxa 150 mg bid   3. Newly dx Multiple Myloma  F/u with [facility] tomorrow  for further staging  and treatment plan   afib clinic as needed   Butch Penny C. Jakeya Gherardi, [facility] [address] [place], [place] [address] [phone]

## 2019-08-30 ENCOUNTER — Ambulatory Visit: Payer: Medicare PPO | Admitting: Podiatry

## 2019-09-06 ENCOUNTER — Ambulatory Visit: Payer: Medicare PPO | Admitting: Podiatry

## 2019-09-10 ENCOUNTER — Encounter: Payer: Self-pay | Admitting: Podiatry

## 2019-09-10 ENCOUNTER — Ambulatory Visit: Payer: Medicare PPO | Admitting: Podiatry

## 2019-09-10 ENCOUNTER — Other Ambulatory Visit: Payer: Self-pay

## 2019-09-10 DIAGNOSIS — D689 Coagulation defect, unspecified: Secondary | ICD-10-CM | POA: Diagnosis not present

## 2019-09-10 DIAGNOSIS — M79675 Pain in left toe(s): Secondary | ICD-10-CM

## 2019-09-10 DIAGNOSIS — M21621 Bunionette of right foot: Secondary | ICD-10-CM | POA: Diagnosis not present

## 2019-09-10 DIAGNOSIS — B351 Tinea unguium: Secondary | ICD-10-CM | POA: Diagnosis not present

## 2019-09-10 DIAGNOSIS — M79674 Pain in right toe(s): Secondary | ICD-10-CM

## 2019-09-10 NOTE — Progress Notes (Signed)
This patient returns to my office for at risk foot care.  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  Patient is taking pradaxa.This patient is unable to cut nails himself since the patient cannot reach his nails.These nails are painful walking and wearing shoes.  This patient presents for at risk foot care today.  General Appearance  Alert, conversant and in no acute stress.  Vascular  Dorsalis pedis and posterior tibial  pulses are palpable  bilaterally.  Capillary return is within normal limits  bilaterally. Temperature is within normal limits  bilaterally.  Neurologic  Senn-Weinstein monofilament wire test within normal limits  bilaterally. Muscle power within normal limits bilaterally.  Nails Thick disfigured discolored nails with subungual debris  from hallux to fifth toes bilaterally. No evidence of bacterial infection or drainage bilaterally.  Orthopedic  No limitations of motion  feet .  No crepitus or effusions noted.  No bony pathology or digital deformities noted.  Skin  normotropic skin with no porokeratosis noted bilaterally.  No signs of infections or ulcers noted.     Onychomycosis  Pain in right toes  Pain in left toes  Consent was obtained for treatment procedures.   Mechanical debridement of nails 1-5  bilaterally performed with a nail nipper.  Filed with dremel without incident.    Return office visit   3 months.                  Told patient to return for periodic foot care and evaluation due to potential at risk complications.   Gardiner Barefoot DPM

## 2019-10-28 ENCOUNTER — Other Ambulatory Visit: Payer: Self-pay

## 2019-11-28 ENCOUNTER — Other Ambulatory Visit (HOSPITAL_COMMUNITY): Payer: Self-pay | Admitting: Nurse Practitioner

## 2019-12-04 ENCOUNTER — Other Ambulatory Visit: Payer: Self-pay

## 2019-12-04 MED ORDER — DABIGATRAN ETEXILATE MESYLATE 150 MG PO CAPS: 150.0000 mg | ORAL_CAPSULE | Freq: Two times a day (BID) | ORAL | 1 refills | Status: DC

## 2019-12-18 ENCOUNTER — Encounter: Payer: Self-pay | Admitting: Podiatry

## 2019-12-18 ENCOUNTER — Other Ambulatory Visit: Payer: Self-pay

## 2019-12-18 ENCOUNTER — Ambulatory Visit: Payer: Medicare PPO | Admitting: Podiatry

## 2019-12-18 DIAGNOSIS — M79675 Pain in left toe(s): Secondary | ICD-10-CM | POA: Diagnosis not present

## 2019-12-18 DIAGNOSIS — B351 Tinea unguium: Secondary | ICD-10-CM | POA: Diagnosis not present

## 2019-12-18 DIAGNOSIS — D689 Coagulation defect, unspecified: Secondary | ICD-10-CM

## 2019-12-18 DIAGNOSIS — M79674 Pain in right toe(s): Secondary | ICD-10-CM | POA: Diagnosis not present

## 2019-12-18 NOTE — Progress Notes (Signed)
This patient returns to my office for at risk foot care.  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  Patient is taking pradaxa.This patient is unable to cut nails himself since the patient cannot reach his nails.These nails are painful walking and wearing shoes.  This patient presents for at risk foot care today.  General Appearance  Alert, conversant and in no acute stress.  Vascular  Dorsalis pedis and posterior tibial  pulses are palpable  bilaterally.  Capillary return is within normal limits  bilaterally. Temperature is within normal limits  bilaterally.  Neurologic  Senn-Weinstein monofilament wire test within normal limits  bilaterally. Muscle power within normal limits bilaterally.  Nails Thick disfigured discolored nails with subungual debris  from hallux to fifth toes bilaterally. No evidence of bacterial infection or drainage bilaterally.  Orthopedic  No limitations of motion  feet .  No crepitus or effusions noted.  No bony pathology or digital deformities noted.  Hammer toes 2,3 right foot.  Skin  normotropic skin with no porokeratosis noted bilaterally.  No signs of infections or ulcers noted.  Asymptomatic diffuse callus right forefoot.   Onychomycosis  Pain in right toes  Pain in left toes  Consent was obtained for treatment procedures.   Mechanical debridement of nails 1-5  bilaterally performed with a nail nipper.  Filed with dremel without incident.    Return office visit   3 months.                  Told patient to return for periodic foot care and evaluation due to potential at risk complications.   Gardiner Barefoot DPM

## 2019-12-26 DIAGNOSIS — C9000 Multiple myeloma not having achieved remission: Secondary | ICD-10-CM | POA: Insufficient documentation

## 2020-01-09 ENCOUNTER — Telehealth: Payer: Self-pay | Admitting: Cardiology

## 2020-01-09 NOTE — Telephone Encounter (Signed)
      I went in pt's chart to see when he need his next office visit. It is November 2021

## 2020-03-09 ENCOUNTER — Ambulatory Visit (INDEPENDENT_AMBULATORY_CARE_PROVIDER_SITE_OTHER): Payer: Medicare HMO | Admitting: Adult Health

## 2020-03-09 ENCOUNTER — Ambulatory Visit: Payer: Medicare PPO | Admitting: Pulmonary Disease

## 2020-03-09 ENCOUNTER — Other Ambulatory Visit: Payer: Self-pay

## 2020-03-09 ENCOUNTER — Encounter: Payer: Self-pay | Admitting: Adult Health

## 2020-03-09 VITALS — BP 128/76 | HR 64

## 2020-03-09 DIAGNOSIS — J310 Chronic rhinitis: Secondary | ICD-10-CM

## 2020-03-09 DIAGNOSIS — J4520 Mild intermittent asthma, uncomplicated: Secondary | ICD-10-CM | POA: Diagnosis not present

## 2020-03-09 DIAGNOSIS — G4733 Obstructive sleep apnea (adult) (pediatric): Secondary | ICD-10-CM | POA: Diagnosis not present

## 2020-03-09 DIAGNOSIS — Z9989 Dependence on other enabling machines and devices: Secondary | ICD-10-CM | POA: Diagnosis not present

## 2020-03-09 DIAGNOSIS — J45909 Unspecified asthma, uncomplicated: Secondary | ICD-10-CM | POA: Insufficient documentation

## 2020-03-09 NOTE — Patient Instructions (Addendum)
Continue on CPAP At bedtime   Work on healthy weight  Do not drive if sleepy.  CPAP download with SD card ordered from DME .   Continue on Singulair daily  Continue on Zyrtec daily As needed  Allergies  Continue on Flonase daily As needed  Nasal congestion   Call us if you have any flare in cough or wheezing/Asthma.   Follow up with [name]  In 1 year and As needed

## 2020-03-09 NOTE — Progress Notes (Signed)
_0  ID: [name]., male    DOB: [birth year], 80 y.o.   MRN: [id number]  Chief Complaint  Patient presents with  . Asthma    Referring provider: Derinda Late, MD  HPI: 80 year old male followed for COPD with asthma chronic rhinitis and obstructive sleep apnea Medical history significant for Dementia on rx , Multiple myeloma   TEST/EVENTS :  PSG 08/05/10>>AHI 24.2, SpO2 low 77%, PLMI 15.8. PSG with oral appliance 02/07/12>>AHI 11.5, SpO2 low 78%. Both obstructive and central events. Auto CPAP 01/14/14 to 02/12/14 >>used on 17 of 30 nights with average 4 hrs and 55 min. Average AHI is 11 with median CPAP 7 cm H2O and 95 th percentile CPAP 9 cm H20. Mostly central events.  Pulmonary tests PFT 07/26/11>>FEV1 2.62(91%), FEV1% 66, TLC 6.55(101%), DLCO 94%, +BD  03/09/2020 Follow up : OSA, Asthma , CR  Patient presents for a follow-up visit.  Last seen April 2020.  Patient has underlying obstructive sleep apnea.  Is on nocturnal CPAP.  Patient says he is doing well on CPAP.  Wears it every night.  Feels rested with no significant daytime sleepiness.  Unable to get download. Needs SD card.   Patient has intermittent asthma and chronic rhinitis.  Previously used Qvar if he has an asthma flare, has not used in >2 years.   Is on Zyrtec and Flonase daily.  Along with Singulair.  Says overall breathing and allergies have been doing well.   Patient is fully vaccinated with COVID-19 times 2+ booster.  Influenza vaccine is up-to-date.   Currently undergoing cancer -Multiple myeloma . Says he is tolerating treatments currently   Lives at home with significant other. Independent with driving and ADLS.  Says he is very active. Walks daily on treadmill . 25mn exercise each day.    Allergies  Allergen Reactions  . Tamsulosin Other (See Comments)    dizziness  . Proair Hfa [Albuterol] Palpitations    Only use in emergency due to has heart problems    Immunization History   Administered Date(s) Administered  . Fluad Quad(high Dose 65+) 12/11/2019  . Influenza Split 01/25/2011, 01/10/2012  . Influenza,inj,Quad PF,6+ Mos 11/19/2012, 11/11/2013  . Moderna Sars-Covid-2 Vaccination 03/11/2019, 04/10/2019, 11/25/2019  . Pneumococcal Conjugate-13 09/16/2013  . Pneumococcal Polysaccharide-23 03/29/1999, 04/23/2007  . Tdap 04/02/2013  . Zoster 10/29/2009  . Zoster Recombinat (Shingrix) 01/31/2018, 04/04/2018    Past Medical History:  Diagnosis Date  . Allergy   . Arrhythmia   . [place]   . Barrett esophagus   . Barrett's esophagus   . Blood transfusion without reported diagnosis   . BPH (benign prostatic hyperplasia)   . Cervical stenosis of spine   . Chronic obstructive asthma   . [facility]   airway was up near nostrils with intubation per patient with bowel surgery  . ED (erectile dysfunction)   . GERD (gastroesophageal reflux disease)   . Hammer toe of right foot   . Hiatal hernia   . Hyperlipidemia   . Hypertension   . OSA (obstructive sleep apnea) 08/05/10 PSG   AHI 24  . Paroxysmal atrial fibrillation ([place]    Hospitalized on 05/27/10  . Renal cell carcinoma 2009   right renal mass, followed by Dr. HAnder Slade . Sleep apnea     Tobacco History: Social History   Tobacco Use  Smoking Status Former Smoker  . Years: 10.00  . Types: Cigars  . Quit  date: 02/29/1980  . Years since quitting: 40.0  Smokeless Tobacco Never Used  Tobacco Comment   quit when he was 80 years old   Counseling given: Not Answered Comment: quit when he was 80 years old   Outpatient Medications Prior to Visit  Medication Sig Dispense Refill  . alfuzosin (UROXATRAL) 10 MG 24 hr tablet Take 1 tablet by mouth daily.    . AMBULATORY NON FORMULARY MEDICATION CPAP at night    . AMBULATORY NON FORMULARY MEDICATION Carnivora  Take 1 tablet twice a day    . dabigatran (PRADAXA) 150 MG CAPS capsule Take 1  capsule (150 mg total) by mouth 2 (two) times daily. 180 capsule 1  . diltiazem (CARDIZEM CD) 240 MG 24 hr capsule TAKE 1 CAPSULE(240 MG) BY MOUTH DAILY 30 capsule 11  . donepezil (ARICEPT) 10 MG tablet Take 10 mg by mouth daily.     Marland Kitchen dutasteride (AVODART) 0.5 MG capsule Take 0.5 mg by mouth daily.   11  . fluticasone (FLONASE) 50 MCG/ACT nasal spray Place 2 sprays into both nostrils as needed.     Marland Kitchen LORazepam (ATIVAN) 0.5 MG tablet Take 0.5 mg by mouth 2 (two) times daily.     . memantine (NAMENDA) 10 MG tablet     . metoprolol succinate (TOPROL-XL) 50 MG 24 hr tablet TAKE 1 TABLET BY MOUTH EVERY DAY WITH OR FOLLOWING A MEAL 90 tablet 3  . montelukast (SINGULAIR) 10 MG tablet TAKE 1 TABLET(10 MG) BY MOUTH AT BEDTIME 90 tablet 2  . omeprazole (PRILOSEC) 20 MG capsule TAKE ONE CAPSULE BY MOUTH EVERY DAY 30 capsule 0  . polyethylene glycol (MIRALAX) 17 g packet Take by mouth daily as needed.     . pravastatin (PRAVACHOL) 20 MG tablet Take 20 mg daily by mouth.    . tadalafil (CIALIS) 5 MG tablet Take 5 mg by mouth as needed.     . terbinafine (LAMISIL) 250 MG tablet      No facility-administered medications prior to visit.     Review of Systems:   Constitutional:   No  weight loss, night sweats,  Fevers, chills,  +fatigue, or  lassitude.  HEENT:   No headaches,  Difficulty swallowing,  Tooth/dental problems, or  Sore throat,                No sneezing, itching, ear ache,  +sal congestion, post nasal drip,   CV:  No chest pain,  Orthopnea, PND, swelling in lower extremities, anasarca, dizziness, palpitations, syncope.   GI  No heartburn, indigestion, abdominal pain, nausea, vomiting, diarrhea, change in bowel habits, loss of appetite, bloody stools.   Resp:    No chest wall deformity  Skin: no rash or lesions.  GU: no dysuria, change in color of urine, no urgency or frequency.  No flank pain, no hematuria   MS:  No joint pain or swelling.  No decreased range of motion.  No back  pain.    Physical Exam  BP 128/76 (BP Location: Left Arm, Cuff Size: Normal)   Pulse 64   SpO2 99%   GEN: A/Ox3; pleasant , NAD, well nourished, elderly    HEENT:  [place]/AT,   , NOSE-clear, THROAT-clear, no lesions, no postnasal drip or exudate noted.   NECK:  Supple w/ fair ROM; no JVD; normal carotid impulses w/o bruits; no thyromegaly or nodules palpated; no lymphadenopathy.    RESP  Clear  P & A; w/o, wheezes/ rales/ or rhonchi. no accessory muscle  use, no dullness to percussion  CARD:  RRR, no m/r/g, no peripheral edema, pulses intact, no cyanosis or clubbing.  GI:   Soft & nt; nml bowel sounds; no organomegaly or masses detected.   Musco: Warm bil, no deformities or joint swelling noted.   Neuro: alert, no focal deficits noted.    Skin: Warm, no lesions or rashes    Lab Results:  BNP No results found for: BNP  ProBNP  Imaging: No results found.    No flowsheet data found.  No results found for: NITRICOXIDE      Assessment & Plan:   Asthma Continue on trigger prevention . Doing well not on rx.  Intolerant to Albuterol   Plan Patient Instructions  Continue on CPAP At bedtime   Work on healthy weight  Do not drive if sleepy.  CPAP download with SD card ordered from DME .   Continue on Singulair daily  Continue on Zyrtec daily As needed  Allergies  Continue on Flonase daily As needed  Nasal congestion   Call us if you have any flare in cough or wheezing/Asthma.   Follow up with [name]  In 1 year and As needed        Rhinitis Controlled on current regimen   Plan  Patient Instructions  Continue on CPAP At bedtime   Work on healthy weight  Do not drive if sleepy.  CPAP download with SD card ordered from DME .   Continue on Singulair daily  Continue on Zyrtec daily As needed  Allergies  Continue on Flonase daily As needed  Nasal congestion   Call us if you have any flare in cough or wheezing/Asthma.   Follow up with [name]  In 1  year and As needed        OSA on CPAP Doing well on CPAP  SD card ordered and CPAP download   Plan  Patient Instructions  Continue on CPAP At bedtime   Work on healthy weight  Do not drive if sleepy.  CPAP download with SD card ordered from DME .   Continue on Singulair daily  Continue on Zyrtec daily As needed  Allergies  Continue on Flonase daily As needed  Nasal congestion   Call us if you have any flare in cough or wheezing/Asthma.   Follow up with [name]  In 1 year and As needed           Rexene Edison, NP 03/09/2020

## 2020-03-09 NOTE — Assessment & Plan Note (Signed)
Doing well on CPAP  SD card ordered and CPAP download   Plan  Patient Instructions  Continue on CPAP At bedtime   Work on healthy weight  Do not drive if sleepy.  CPAP download with SD card ordered from DME .   Continue on Singulair daily  Continue on Zyrtec daily As needed  Allergies  Continue on Flonase daily As needed  Nasal congestion   Call us if you have any flare in cough or wheezing/Asthma.   Follow up with [name]  In 1 year and As needed

## 2020-03-09 NOTE — Assessment & Plan Note (Signed)
Controlled on current regimen   Plan  Patient Instructions  Continue on CPAP At bedtime   Work on healthy weight  Do not drive if sleepy.  CPAP download with SD card ordered from DME .   Continue on Singulair daily  Continue on Zyrtec daily As needed  Allergies  Continue on Flonase daily As needed  Nasal congestion   Call us if you have any flare in cough or wheezing/Asthma.   Follow up with [name]  In 1 year and As needed

## 2020-03-09 NOTE — Assessment & Plan Note (Signed)
Continue on trigger prevention . Doing well not on rx.  Intolerant to Albuterol   Plan Patient Instructions  Continue on CPAP At bedtime   Work on healthy weight  Do not drive if sleepy.  CPAP download with SD card ordered from DME .   Continue on Singulair daily  Continue on Zyrtec daily As needed  Allergies  Continue on Flonase daily As needed  Nasal congestion   Call us if you have any flare in cough or wheezing/Asthma.   Follow up with [name]  In 1 year and As needed

## 2020-03-10 NOTE — Progress Notes (Signed)
Reviewed and agree with assessment/plan.   Djuana Littleton, MD [facility] 03/10/2020, 8:38 AM Pager:  [phone]  

## 2020-03-25 ENCOUNTER — Encounter: Payer: Self-pay | Admitting: Podiatry

## 2020-03-25 ENCOUNTER — Other Ambulatory Visit: Payer: Self-pay

## 2020-03-25 ENCOUNTER — Ambulatory Visit (INDEPENDENT_AMBULATORY_CARE_PROVIDER_SITE_OTHER): Payer: Medicare HMO | Admitting: Podiatry

## 2020-03-25 DIAGNOSIS — B351 Tinea unguium: Secondary | ICD-10-CM

## 2020-03-25 DIAGNOSIS — D689 Coagulation defect, unspecified: Secondary | ICD-10-CM

## 2020-03-25 DIAGNOSIS — M79675 Pain in left toe(s): Secondary | ICD-10-CM | POA: Diagnosis not present

## 2020-03-25 DIAGNOSIS — M79674 Pain in right toe(s): Secondary | ICD-10-CM

## 2020-03-25 NOTE — Progress Notes (Signed)
This patient returns to my office for at risk foot care.  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  Patient is taking pradaxa.This patient is unable to cut nails himself since the patient cannot reach his nails.These nails are painful walking and wearing shoes.  This patient presents for at risk foot care today.  General Appearance  Alert, conversant and in no acute stress.  Vascular  Dorsalis pedis  Are weakly  palpable  bilaterally. Posterior tibial pulses are absent  Bilateral. Capillary return is within normal limits  bilaterally. Temperature is within normal limits  bilaterally.  Neurologic  Senn-Weinstein monofilament wire test within normal limits  bilaterally. Muscle power within normal limits bilaterally.  Nails Thick disfigured discolored nails with subungual debris  from hallux to fifth toes bilaterally. No evidence of bacterial infection or drainage bilaterally.  Orthopedic  No limitations of motion  feet .  No crepitus or effusions noted.  No bony pathology or digital deformities noted.  Hammer toes 2,3 right foot.  Skin  normotropic skin with no porokeratosis noted bilaterally.  No signs of infections or ulcers noted.  Asymptomatic diffuse callus right forefoot.   Onychomycosis  Pain in right toes  Pain in left toes  Consent was obtained for treatment procedures.   Mechanical debridement of nails 1-5  bilaterally performed with a nail nipper.  Filed with dremel without incident.    Return office visit   3 months.                  Told patient to return for periodic foot care and evaluation due to potential at risk complications.   Gardiner Barefoot DPM

## 2020-04-03 NOTE — Progress Notes (Signed)
[name]. Date of Birth: [birth year] Medical Record #[id number]  History of Present Illness: [name] is seen for follow up Afib.He has a history of atrial fibrillation dating back to 2012. Initially this was paroxysmal but multiple Ecgs since then show persistent Afib with controlled rate. He is managed with metoprolol, diltiazem, and pradaxa.   He was seen in the Afib clinic this past June with elevated HR in Afib. Cardizem dose was increased. He had been diagnosed with multiple myeloma and was receiving chemotherapy  On follow up he continues with therapy with Daratumumab every other week. He is tolerating this well. He walks on his treadmill 20 minutes a day. He has lost 20 lbs. He has been experiencing some dizziness and sometimes BP is low. Saw ENT and they didn't think this was vertigo.   Allergies as of 04/06/2020      Reactions   Tamsulosin Other (See Comments)   dizziness   Proair Hfa [albuterol] Palpitations   Only use in emergency due to has heart problems      Medication List       Accurate as of April 06, 2020  4:09 PM. If you have any questions, ask your nurse or doctor.        STOP taking these medications   diltiazem 240 MG 24 hr capsule Commonly known as: TIAZAC Stopped by: Om Lizotte Martinique, MD     TAKE these medications   acyclovir 400 MG tablet Commonly known as: ZOVIRAX Take 1 tablet by mouth twice daily for VZV (Shingles) prophylaxis.   albuterol 108 (90 Base) MCG/ACT inhaler Commonly known as: VENTOLIN HFA Inhale into the lungs.   alfuzosin 10 MG 24 hr tablet Commonly known as: UROXATRAL Take 1 tablet by mouth daily.   AMBULATORY NON FORMULARY MEDICATION CPAP at night   AMBULATORY NON FORMULARY MEDICATION Carnivora  Take 1 tablet twice a day   calcium citrate-vitamin D 315-200 MG-UNIT tablet Commonly known as: CITRACAL+D Take by mouth.   dabigatran 150 MG Caps capsule Commonly known as: Pradaxa Take 1 capsule (150 mg total) by  mouth 2 (two) times daily.   diltiazem 120 MG 24 hr capsule Commonly known as: CARDIZEM CD Take 1 capsule (120 mg total) by mouth daily. What changed:   medication strength  See the new instructions. Changed by: Delvis Kau Martinique, MD   donepezil 10 MG tablet Commonly known as: ARICEPT Take 10 mg by mouth daily.   dutasteride 0.5 MG capsule Commonly known as: AVODART Take 0.5 mg by mouth daily.   fluticasone 50 MCG/ACT nasal spray Commonly known as: FLONASE Place 2 sprays into both nostrils as needed.   lenalidomide 10 MG capsule Commonly known as: REVLIMID Take 1 capsule (10 mg) by mouth on days 1-21 of each 28 day cycle. Adult Male Auth # [id number] Date 02/27/20   LORazepam 0.5 MG tablet Commonly known as: ATIVAN Take 0.5 mg by mouth 2 (two) times daily.   memantine 10 MG tablet Commonly known as: NAMENDA   metoprolol succinate 50 MG 24 hr tablet Commonly known as: TOPROL-XL TAKE 1 TABLET BY MOUTH EVERY DAY WITH OR FOLLOWING A MEAL   montelukast 10 MG tablet Commonly known as: SINGULAIR TAKE 1 TABLET(10 MG) BY MOUTH AT BEDTIME   omeprazole 20 MG capsule Commonly known as: PRILOSEC TAKE ONE CAPSULE BY MOUTH EVERY DAY   polyethylene glycol 17 g packet Commonly known as: MIRALAX / GLYCOLAX Take by mouth daily as needed.   pravastatin 20 MG tablet  Commonly known as: PRAVACHOL Take 20 mg daily by mouth.   tadalafil 5 MG tablet Commonly known as: CIALIS Take 5 mg by mouth as needed.   terbinafine 250 MG tablet Commonly known as: LAMISIL        Allergies  Allergen Reactions   Tamsulosin Other (See Comments)    dizziness   Proair Hfa [Albuterol] Palpitations    Only use in emergency due to has heart problems    Past Medical History:  Diagnosis Date   Allergy    Arrhythmia    Atrial fibrillation (HCC)    Barrett esophagus    Barrett's esophagus    Blood transfusion without reported diagnosis    BPH (benign prostatic hyperplasia)     Cervical stenosis of spine    Chronic obstructive asthma    Difficult intubation 13 or 14 years ago, at [facility]   airway was up near nostrils with intubation per patient with bowel surgery   ED (erectile dysfunction)    GERD (gastroesophageal reflux disease)    Hammer toe of right foot    Hiatal hernia    Hyperlipidemia    Hypertension    OSA (obstructive sleep apnea) 08/05/10 PSG   AHI 24   Paroxysmal atrial fibrillation (HCC)    Hospitalized on 05/27/10   Renal cell carcinoma 2009   right renal mass, followed by Dr. Teodora Medici   Sleep apnea     Past Surgical History:  Procedure Laterality Date   BASAL CELL CARCINOMA EXCISION     BIOPSY  12/18/2017   Procedure: BIOPSY;  Surgeon: Meryl Dare, MD;  Location: [facility];  Service: Endoscopy;;   ESOPHAGOGASTRODUODENOSCOPY (EGD) WITH PROPOFOL N/A 08/26/2014   Procedure: ESOPHAGOGASTRODUODENOSCOPY (EGD) WITH PROPOFOL;  Surgeon: Meryl Dare, MD;  Location: [facility];  Service: Endoscopy;  Laterality: N/A;   ESOPHAGOGASTRODUODENOSCOPY (EGD) WITH PROPOFOL N/A 12/18/2017   Procedure: ESOPHAGOGASTRODUODENOSCOPY (EGD) WITH PROPOFOL;  Surgeon: Meryl Dare, MD;  Location: [facility];  Service: Endoscopy;  Laterality: N/A;   EXPLORATORY LAPAROTOMY W/ BOWEL RESECTION  12/1999   segmental sbr of a hemangioma, repair of incarcerated umbilical hernia   exploratory laparotomy, segmental small bowel resection of a hemangioma, repair of incarcerated umbelical hernia  12/1999   EYE SURGERY     cataracts done   renal cancer surgery with RF Ablation     TONSILLECTOMY      Social History   Socioeconomic History   Marital status: Divorced    Spouse name: Not on file   Number of children: 4   Years of education: Not on file   Highest education level: Not on file  Occupational History   Occupation: tv producer/construction   Occupation: Radio broadcast assistant  Tobacco Use   Smoking status: Former  Smoker    Years: 10.00    Types: Cigars    Quit date: 02/29/1980    Years since quitting: 40.1   Smokeless tobacco: Never Used   Tobacco comment: quit when he was 80 years old  Vaping Use   Vaping Use: Never used  Substance and Sexual Activity   Alcohol use: No   Drug use: No   Sexual activity: Not Currently  Other Topics Concern   Not on file  Social History Narrative   ** Merged History Encounter ** Married. Education: [employer]. Exercise: Eleptical times a week for 45 minutes.          Social Determinants of Health   Financial Resource Strain: Not on file  Food Insecurity:  Not on file  Transportation Needs: Not on file  Physical Activity: Not on file  Stress: Not on file  Social Connections: Not on file    Family History  Problem Relation Age of Onset   Stroke Mother    Diabetes Mother    Obesity Brother    Diabetes Brother    Diabetes Father    Asthma Paternal Grandmother    Heart attack Paternal Grandfather    Kidney cancer Paternal Aunt    Colon cancer Neg Hx    Esophageal cancer Neg Hx    Rectal cancer Neg Hx    Stomach cancer Neg Hx     Review of Systems: As noted in HPI.  All other systems were reviewed and are negative.  Physical Exam: BP 140/72    Pulse 62    Ht $R'5\' 9"'iO$  (1.753 m)    Wt 170 lb 9.6 oz (77.4 kg)    BMI 25.19 kg/m  Filed Weights   04/06/20 1543  Weight: 170 lb 9.6 oz (77.4 kg)   GENERAL:  Well appearing WM in NAD HEENT:  PERRL, EOMI, sclera are clear. Oropharynx is clear. NECK:  No jugular venous distention, carotid upstroke brisk and symmetric, no bruits, no thyromegaly or adenopathy LUNGS:  Clear to auscultation bilaterally CHEST:  Unremarkable HEART:  IRRR,  PMI not displaced or sustained,S1 and S2 within normal limits, no S3, no S4: no clicks, no rubs, no murmurs ABD:  Soft, nontender. BS +, no masses or bruits. No hepatomegaly, no splenomegaly EXT:  2 + pulses throughout, no edema, no cyanosis no clubbing SKIN:   Warm and dry.  No rashes NEURO:  Alert and oriented x 3. Cranial nerves II through XII intact. PSYCH:  Cognitively intact   LABORATORY DATA:  Lab Results  Component Value Date   WBC 4.9 09/16/2013   HGB 14.3 09/16/2013   HCT 41.6 09/16/2013   PLT 212 09/16/2013   GLUCOSE 80 08/29/2018   CHOL 124 11/08/2017   TRIG 56 11/08/2017   HDL 49 11/08/2017   LDLCALC 64 11/08/2017   ALT 10 08/29/2018   AST 15 08/29/2018   NA 139 08/29/2018   K 4.7 08/29/2018   CL 101 08/29/2018   CREATININE 1.28 (H) 08/29/2018   BUN 19 08/29/2018   CO2 25 08/29/2018   TSH 2.005 11/19/2012   PSA 2.79 11/19/2012   INR 1.17 05/18/2011   Dated 03/26/20: Hgb 11.2. WBC 2.6. plts 154k. CMET normal    Assessment / Plan: 1. Atrial fibrillation. Permanent.  In review of his records his HR is between 55-64 on metoprolol and diltiazem. He is having some dizziness. He doesn't need this tight of HR control. We just want to keep HR < 100. Will reduce Cardizem to 120 mg daily.  Continue  Pradaxa.  I will follow up in 6 months.   2. CKD stage 2. Stable.

## 2020-04-06 ENCOUNTER — Other Ambulatory Visit: Payer: Self-pay

## 2020-04-06 ENCOUNTER — Ambulatory Visit: Payer: Medicare HMO | Admitting: Cardiology

## 2020-04-06 ENCOUNTER — Encounter: Payer: Self-pay | Admitting: Cardiology

## 2020-04-06 VITALS — BP 140/72 | HR 62 | Ht 69.0 in | Wt 170.6 lb

## 2020-04-06 DIAGNOSIS — I482 Chronic atrial fibrillation, unspecified: Secondary | ICD-10-CM

## 2020-04-06 DIAGNOSIS — E785 Hyperlipidemia, unspecified: Secondary | ICD-10-CM | POA: Diagnosis not present

## 2020-04-06 MED ORDER — DILTIAZEM HCL ER COATED BEADS 120 MG PO CP24
120.0000 mg | ORAL_CAPSULE | Freq: Every day | ORAL | 3 refills | Status: DC
Start: 2020-04-06 — End: 2020-10-05

## 2020-04-06 NOTE — Patient Instructions (Signed)
Reduce Cardizem to 120 mg daily.

## 2020-04-29 ENCOUNTER — Other Ambulatory Visit: Payer: Self-pay

## 2020-04-29 MED ORDER — METOPROLOL SUCCINATE ER 50 MG PO TB24: ORAL_TABLET | ORAL | 3 refills | Status: DC

## 2020-06-23 ENCOUNTER — Telehealth: Payer: Self-pay | Admitting: Cardiology

## 2020-06-23 NOTE — Telephone Encounter (Signed)
*  STAT* If patient is at the pharmacy, call can be transferred to refill team.   1. Which medications need to be refilled? (please list name of each medication and dose if known) dabigatran (PRADAXA) 150 MG CAPS capsule  2. Which pharmacy/location (including street and city if local pharmacy) is medication to be sent to? [facility]                     [address]          [place], [place] [address]  3. Do they need a 30 day or 90 day supply? 90  Please advise patient has a new pharmacy please aadjust

## 2020-06-24 ENCOUNTER — Ambulatory Visit (INDEPENDENT_AMBULATORY_CARE_PROVIDER_SITE_OTHER): Payer: Medicare HMO | Admitting: Podiatry

## 2020-06-24 ENCOUNTER — Other Ambulatory Visit: Payer: Self-pay

## 2020-06-24 ENCOUNTER — Encounter: Payer: Self-pay | Admitting: Podiatry

## 2020-06-24 DIAGNOSIS — M79675 Pain in left toe(s): Secondary | ICD-10-CM

## 2020-06-24 DIAGNOSIS — B351 Tinea unguium: Secondary | ICD-10-CM | POA: Diagnosis not present

## 2020-06-24 DIAGNOSIS — D689 Coagulation defect, unspecified: Secondary | ICD-10-CM

## 2020-06-24 DIAGNOSIS — M21621 Bunionette of right foot: Secondary | ICD-10-CM

## 2020-06-24 DIAGNOSIS — M79674 Pain in right toe(s): Secondary | ICD-10-CM

## 2020-06-24 MED ORDER — DABIGATRAN ETEXILATE MESYLATE 150 MG PO CAPS: 150.0000 mg | ORAL_CAPSULE | Freq: Two times a day (BID) | ORAL | 1 refills | Status: DC

## 2020-06-24 NOTE — Progress Notes (Signed)
This patient returns to my office for at risk foot care.  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  Patient is taking pradaxa.This patient is unable to cut nails himself since the patient cannot reach his nails.These nails are painful walking and wearing shoes.  This patient presents for at risk foot care today.  General Appearance  Alert, conversant and in no acute stress.  Vascular  Dorsalis pedis  Are weakly  palpable  bilaterally. Posterior tibial pulses are absent  Bilateral. Capillary return is within normal limits  Bilateral.  Cold feet.  Bilaterally. Absent hair noted.  Neurologic  Senn-Weinstein monofilament wire test within normal limits  bilaterally. Muscle power within normal limits bilaterally.  Nails Thick disfigured discolored nails with subungual debris  from hallux to fifth toes bilaterally. No evidence of bacterial infection or drainage bilaterally.  Orthopedic  No limitations of motion  feet .  No crepitus or effusions noted.  No bony pathology or digital deformities noted.  Hammer toes 2,3 right foot.  Skin  normotropic skin with no porokeratosis noted bilaterally.  No signs of infections or ulcers noted.  Asymptomatic diffuse callus right forefoot.   Onychomycosis  Pain in right toes  Pain in left toes  Consent was obtained for treatment procedures.   Mechanical debridement of nails 1-5  bilaterally performed with a nail nipper.  Filed with dremel without incident.    Return office visit   3 months.                  Told patient to return for periodic foot care and evaluation due to potential at risk complications.   Gardiner Barefoot DPM

## 2020-06-24 NOTE — Telephone Encounter (Signed)
79 M  77.4 kg, SCr 1.28, CrCl 49.6.  LOV PJ 03/2020

## 2020-08-19 ENCOUNTER — Other Ambulatory Visit: Payer: Self-pay | Admitting: Family Medicine

## 2020-08-19 ENCOUNTER — Ambulatory Visit
Admission: RE | Admit: 2020-08-19 | Discharge: 2020-08-19 | Disposition: A | Discharge status: Home / Self Care | Payer: Medicare HMO | Source: Ambulatory Visit | Attending: Family Medicine | Admitting: Family Medicine

## 2020-08-19 DIAGNOSIS — R079 Chest pain, unspecified: Secondary | ICD-10-CM

## 2020-08-20 ENCOUNTER — Other Ambulatory Visit: Payer: Self-pay | Admitting: Family Medicine

## 2020-08-20 DIAGNOSIS — M542 Cervicalgia: Secondary | ICD-10-CM

## 2020-08-20 DIAGNOSIS — Z8579 Personal history of other malignant neoplasms of lymphoid, hematopoietic and related tissues: Secondary | ICD-10-CM

## 2020-08-21 ENCOUNTER — Ambulatory Visit
Admission: RE | Admit: 2020-08-21 | Discharge: 2020-08-21 | Disposition: A | Discharge status: Home / Self Care | Payer: Medicare HMO | Source: Ambulatory Visit | Attending: Family Medicine | Admitting: Family Medicine

## 2020-08-21 DIAGNOSIS — Z8579 Personal history of other malignant neoplasms of lymphoid, hematopoietic and related tissues: Secondary | ICD-10-CM

## 2020-08-21 DIAGNOSIS — M542 Cervicalgia: Secondary | ICD-10-CM

## 2020-08-21 MED ORDER — GADOBENATE DIMEGLUMINE 529 MG/ML IV SOLN
15.0000 mL | Freq: Once | INTRAVENOUS | Status: AC | PRN
  Administered 2020-08-21: 15 mL via INTRAVENOUS

## 2020-09-21 ENCOUNTER — Telehealth: Payer: Self-pay | Admitting: *Deleted

## 2020-09-21 ENCOUNTER — Other Ambulatory Visit: Payer: Self-pay | Admitting: Cardiology

## 2020-09-21 ENCOUNTER — Other Ambulatory Visit: Payer: Self-pay | Admitting: Family Medicine

## 2020-09-21 DIAGNOSIS — M5412 Radiculopathy, cervical region: Secondary | ICD-10-CM

## 2020-09-21 NOTE — Telephone Encounter (Signed)
   [facility] Pre-operative Risk Assessment    Patient Name: [name].  DOB: 1940/06/10 MRN: [id number]    Request for surgical clearance:  What type of surgery is being performed?  Cervical epidural  When is this surgery scheduled? tbd  What type of clearance is required (medical clearance vs. Pharmacy clearance to hold med vs. Both)? medical  Are there any medications that need to be held prior to surgery and how long? Pradaxa 2 days prior  Practice name and name of physician performing surgery?  [facility]  What is the office phone number? [phone]   7.   What is the office fax number? [place]  8.   Anesthesia type (None, local, MAC, general) ? local   Raiford Simmonds 09/21/2020, 1:16 PM  _________________________________________________________________   (provider comments below)

## 2020-09-23 ENCOUNTER — Encounter: Payer: Self-pay | Admitting: Podiatry

## 2020-09-23 ENCOUNTER — Other Ambulatory Visit: Payer: Self-pay

## 2020-09-23 ENCOUNTER — Ambulatory Visit (INDEPENDENT_AMBULATORY_CARE_PROVIDER_SITE_OTHER): Payer: Medicare HMO | Admitting: Podiatry

## 2020-09-23 DIAGNOSIS — M79674 Pain in right toe(s): Secondary | ICD-10-CM

## 2020-09-23 DIAGNOSIS — B351 Tinea unguium: Secondary | ICD-10-CM | POA: Diagnosis not present

## 2020-09-23 DIAGNOSIS — M79675 Pain in left toe(s): Secondary | ICD-10-CM | POA: Diagnosis not present

## 2020-09-23 DIAGNOSIS — D689 Coagulation defect, unspecified: Secondary | ICD-10-CM | POA: Diagnosis not present

## 2020-09-23 NOTE — Telephone Encounter (Signed)
Patient with diagnosis of A Fib on Pradaxa for anticoagulation.    Procedure: cervical epidural Date of procedure: TBD   CHA2DS2-VASc Score = 3  This indicates a 3.2% annual risk of stroke. The patient's score is based upon: CHF History: No HTN History: Yes Diabetes History: No Stroke History: No Vascular Disease History: No Age Score: 2 Gender Score: 0    CrCl 47 mL/min Platelet count 148K   Per office protocol, patient can hold Pradaxa for 4 days prior to procedure.    Patient will not need bridging with Lovenox (enoxaparin) around procedure.  If not bridging, patient should restart Pradaxa on the evening of procedure or day after, at discretion of procedure MD

## 2020-09-23 NOTE — Progress Notes (Signed)
This patient returns to my office for at risk foot care.  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  Patient is taking pradaxa.This patient is unable to cut nails himself since the patient cannot reach his nails.These nails are painful walking and wearing shoes.  This patient presents for at risk foot care today.  General Appearance  Alert, conversant and in no acute stress.  Vascular  Dorsalis pedis  Are weakly  palpable  bilaterally. Posterior tibial pulses are absent  Bilateral. Capillary return is within normal limits  Bilateral.  Cold feet.  Bilaterally. Absent hair noted.  Neurologic  Senn-Weinstein monofilament wire test within normal limits  bilaterally. Muscle power within normal limits bilaterally.  Nails Thick disfigured discolored nails with subungual debris  from hallux to fifth toes bilaterally. No evidence of bacterial infection or drainage bilaterally.  Orthopedic  No limitations of motion  feet .  No crepitus or effusions noted.  No bony pathology or digital deformities noted.  Hammer toes 2,3 right foot.  Skin  normotropic skin with no porokeratosis noted bilaterally.  No signs of infections or ulcers noted.  Asymptomatic diffuse callus right forefoot.   Onychomycosis  Pain in right toes  Pain in left toes  Consent was obtained for treatment procedures.   Mechanical debridement of nails 1-5  bilaterally performed with a nail nipper.  Filed with dremel without incident.    Return office visit   3 months.                  Told patient to return for periodic foot care and evaluation due to potential at risk complications.   Gardiner Barefoot DPM

## 2020-09-24 NOTE — Telephone Encounter (Signed)
Patient's voice mailbox full.  Patient needs call back.  8:12 AM on 09/24/2020

## 2020-09-25 NOTE — Progress Notes (Addendum)
[name]. Date of Birth: Jul 02, 1940 Medical Record #[id number]  History of Present Illness: [name] is seen for follow up Afib.He has a history of atrial fibrillation dating back to 2012. Initially this was paroxysmal but multiple Ecgs since then show persistent Afib with controlled rate. He is managed with metoprolol, diltiazem, and pradaxa.   He was seen in the Afib clinic this past June with elevated HR in Afib. Cardizem dose was increased. He had been diagnosed with multiple myeloma and was receiving chemotherapy.  On follow up he continues with therapy with Daratumumab once a month. On Revlimid.  He is tolerating this well. He has developed significant pain and stiffness in his neck. Had cervical spine films done with plans for steroid injection but he has deferred this. More recently has pain and swelling in his right knee and is unable to bear weight. He has lost weight. Denies dizziness, chest pain or dyspnea.   Allergies as of 10/05/2020       Reactions   Tamsulosin Other (See Comments)   dizziness   Proair Hfa [albuterol] Palpitations   Only use in emergency due to has heart problems        Medication List        Accurate as of October 05, 2020 11:42 AM. If you have any questions, ask your nurse or doctor.          STOP taking these medications    fluticasone 50 MCG/ACT nasal spray Commonly known as: FLONASE Stopped by: Marck Mcclenny Martinique, MD   LORazepam 0.5 MG tablet Commonly known as: ATIVAN Stopped by: Bana Borgmeyer Martinique, MD       TAKE these medications    acyclovir 400 MG tablet Commonly known as: ZOVIRAX Take 1 tablet by mouth twice daily for VZV (Shingles) prophylaxis.   albuterol 108 (90 Base) MCG/ACT inhaler Commonly known as: VENTOLIN HFA Inhale into the lungs.   alfuzosin 10 MG 24 hr tablet Commonly known as: UROXATRAL Take 1 tablet by mouth daily.   AMBULATORY NON FORMULARY MEDICATION CPAP at night   AMBULATORY NON FORMULARY  MEDICATION [place]  Take 1 tablet twice a day   calcium citrate-vitamin D 315-200 MG-UNIT tablet Commonly known as: CITRACAL+D Take by mouth.   dabigatran 150 MG Caps capsule Commonly known as: Pradaxa Take 1 capsule (150 mg total) by mouth 2 (two) times daily.   diltiazem 120 MG 24 hr capsule Commonly known as: CARDIZEM CD Take 1 capsule (120 mg total) by mouth daily.   diltiazem 120 MG 24 hr capsule Commonly known as: TIAZAC Take by mouth.   donepezil 10 MG tablet Commonly known as: ARICEPT Take 10 mg by mouth daily.   dutasteride 0.5 MG capsule Commonly known as: AVODART Take 0.5 mg by mouth daily.   lenalidomide 10 MG capsule Commonly known as: REVLIMID Take 1 capsule (10 mg) by mouth on days 1-21 of each 28 day cycle. Adult Male Auth # [id number] Date 02/27/20   memantine 10 MG tablet Commonly known as: NAMENDA   metoprolol succinate 50 MG 24 hr tablet Commonly known as: TOPROL-XL TAKE 1 TABLET BY MOUTH EVERY DAY WITH OR FOLLOWING A MEAL   montelukast 10 MG tablet Commonly known as: SINGULAIR TAKE 1 TABLET(10 MG) BY MOUTH AT BEDTIME   omeprazole 20 MG capsule Commonly known as: PRILOSEC TAKE ONE CAPSULE BY MOUTH EVERY DAY   polyethylene glycol 17 g packet Commonly known as: MIRALAX / GLYCOLAX Take by mouth daily as needed.  pravastatin 20 MG tablet Commonly known as: PRAVACHOL Take 20 mg daily by mouth.   tadalafil 5 MG tablet Commonly known as: CIALIS Take 5 mg by mouth as needed.   terbinafine 250 MG tablet Commonly known as: LAMISIL         Allergies  Allergen Reactions   Tamsulosin Other (See Comments)    dizziness   Proair Hfa [Albuterol] Palpitations    Only use in emergency due to has heart problems    Past Medical History:  Diagnosis Date   Allergy    Arrhythmia    Atrial fibrillation ([place])    Barrett esophagus    Barrett's esophagus    Blood transfusion without reported diagnosis    BPH (benign prostatic hyperplasia)     Cervical stenosis of spine    Chronic obstructive asthma    Difficult intubation 13 or 14 years ago, at [facility] long   airway was up near nostrils with intubation per patient with bowel surgery   ED (erectile dysfunction)    GERD (gastroesophageal reflux disease)    Hammer toe of right foot    Hiatal hernia    Hyperlipidemia    Hypertension    OSA (obstructive sleep apnea) 08/05/10 [facility]   AHI 24   Paroxysmal atrial fibrillation ([place])    Hospitalized on 05/27/10   Renal cell carcinoma 2009   right renal mass, followed by Dr. Ander Slade   Sleep apnea     Past Surgical History:  Procedure Laterality Date   BASAL CELL CARCINOMA EXCISION     BIOPSY  12/18/2017   Procedure: BIOPSY;  Surgeon: [name], MD;  Location: [facility];  Service: Endoscopy;;   ESOPHAGOGASTRODUODENOSCOPY (EGD) WITH PROPOFOL N/A 08/26/2014   Procedure: ESOPHAGOGASTRODUODENOSCOPY (EGD) WITH PROPOFOL;  Surgeon: [name], MD;  Location: [facility];  Service: Endoscopy;  Laterality: N/A;   ESOPHAGOGASTRODUODENOSCOPY (EGD) WITH PROPOFOL N/A 12/18/2017   Procedure: ESOPHAGOGASTRODUODENOSCOPY (EGD) WITH PROPOFOL;  Surgeon: [name], MD;  Location: [facility];  Service: Endoscopy;  Laterality: N/A;   EXPLORATORY LAPAROTOMY W/ BOWEL RESECTION  12/1999   segmental sbr of a hemangioma, repair of incarcerated umbilical hernia   exploratory laparotomy, segmental small bowel resection of a hemangioma, repair of incarcerated umbelical hernia  67/3419   EYE SURGERY     cataracts done   renal cancer surgery with RF Ablation     TONSILLECTOMY      Social History   Socioeconomic History   Marital status: Divorced    Spouse name: Not on file   Number of children: 4   Years of education: Not on file   Highest education level: Not on file  Occupational History   Occupation: tv producer/construction   Occupation: Warehouse manager  Tobacco Use   Smoking status: Former    Types: Cigars    Quit date:  02/29/1980    Years since quitting: 40.6   Smokeless tobacco: Never   Tobacco comments:    quit when he was 80 years old  Vaping Use   Vaping Use: Never used  Substance and Sexual Activity   Alcohol use: No   Drug use: No   Sexual activity: Not Currently  Other Topics Concern   Not on file  Social History Narrative   ** Merged History Encounter ** Married. Education: [employer]. Exercise: Eleptical times a week for 45 minutes.          Social Determinants of Health   Financial Resource Strain: Not on file  Food  Insecurity: Not on file  Transportation Needs: Not on file  Physical Activity: Not on file  Stress: Not on file  Social Connections: Not on file    Family History  Problem Relation Age of Onset   Stroke Mother    Diabetes Mother    Obesity Brother    Diabetes Brother    Diabetes Father    Asthma Paternal Grandmother    Heart attack Paternal Grandfather    Kidney cancer Paternal Aunt    Colon cancer Neg Hx    Esophageal cancer Neg Hx    Rectal cancer Neg Hx    Stomach cancer Neg Hx     Review of Systems: As noted in HPI.  All other systems were reviewed and are negative.  Physical Exam: BP 112/68   Pulse (!) 104   Resp 20   Ht $R'5\' 9"'dy$  (1.753 m)   Wt 162 lb (73.5 kg)   SpO2 95%   BMI 23.92 kg/m  Filed Weights   10/05/20 1103  Weight: 162 lb (73.5 kg)    GENERAL:  frail appearing  WM seen in a wheelchair.  HEENT:  PERRL, EOMI, sclera are clear. Oropharynx is clear. NECK:  No jugular venous distention, carotid upstroke brisk and symmetric, no bruits, no thyromegaly or adenopathy LUNGS:  Clear to auscultation bilaterally CHEST:  Unremarkable HEART:  IRRR,  PMI not displaced or sustained,S1 and S2 within normal limits, no S3, no S4: no clicks, no rubs, no murmurs ABD:  Soft, nontender. BS +, no masses or bruits. No hepatomegaly, no splenomegaly EXT:  2 + pulses throughout, right knee is significantly swollen and tender to palpation. SKIN:  Warm and dry.   No rashes NEURO:  Alert and oriented x 3. Cranial nerves II through XII intact. PSYCH:  Cognitively intact   LABORATORY DATA:  Lab Results  Component Value Date   WBC 4.9 09/16/2013   HGB 14.3 09/16/2013   HCT 41.6 09/16/2013   PLT 212 09/16/2013   GLUCOSE 80 08/29/2018   CHOL 124 11/08/2017   TRIG 56 11/08/2017   HDL 49 11/08/2017   LDLCALC 64 11/08/2017   ALT 10 08/29/2018   AST 15 08/29/2018   NA 139 08/29/2018   K 4.7 08/29/2018   CL 101 08/29/2018   CREATININE 1.28 (H) 08/29/2018   BUN 19 08/29/2018   CO2 25 08/29/2018   TSH 2.005 11/19/2012   PSA 2.79 11/19/2012   INR 1.17 05/18/2011   Dated 03/26/20: Hgb 11.2. WBC 2.6. plts 154k. CMET normal Dated 09/10/20: Hgb 12.5, WBC 5.7, plts 148K. CMET normal.   Ecg today shows Afib with rate 104. LAD, I have personally reviewed and interpreted this study.   Assessment / Plan: 1. Atrial fibrillation. Permanent.  HR has increased. Will increase Cardizem back to 180 mg daily. Continue Toprol XL.  Continue  Pradaxa.  I will follow up in 6 months.   2. CKD stage 2. Stable.  3. Multiple myeloma. Per heme-onc  4. Painful swollen right knee. Follow up with primary care  5. Cervical spine disease. If steroid injection is needed would need to come off Pradaxa for 3 days.

## 2020-09-30 NOTE — Telephone Encounter (Signed)
Patient has poor memory and does not remember this procedure.  According to his wife, the procedure has been canceled for the time being.  I will remove this from the preop pool

## 2020-10-05 ENCOUNTER — Ambulatory Visit (INDEPENDENT_AMBULATORY_CARE_PROVIDER_SITE_OTHER): Payer: Medicare HMO | Admitting: Cardiology

## 2020-10-05 ENCOUNTER — Other Ambulatory Visit: Payer: Self-pay

## 2020-10-05 ENCOUNTER — Encounter: Payer: Self-pay | Admitting: Cardiology

## 2020-10-05 VITALS — BP 112/68 | HR 104 | Resp 20 | Ht 69.0 in | Wt 162.0 lb

## 2020-10-05 DIAGNOSIS — I482 Chronic atrial fibrillation, unspecified: Secondary | ICD-10-CM

## 2020-10-05 DIAGNOSIS — C9000 Multiple myeloma not having achieved remission: Secondary | ICD-10-CM

## 2020-10-05 DIAGNOSIS — E785 Hyperlipidemia, unspecified: Secondary | ICD-10-CM

## 2020-10-05 DIAGNOSIS — D6869 Other thrombophilia: Secondary | ICD-10-CM

## 2020-10-05 MED ORDER — DILTIAZEM HCL ER COATED BEADS 180 MG PO CP24: 180.0000 mg | ORAL_CAPSULE | Freq: Every day | ORAL | 3 refills | Status: DC

## 2020-10-06 ENCOUNTER — Ambulatory Visit
Admission: RE | Admit: 2020-10-06 | Discharge: 2020-10-06 | Disposition: A | Discharge status: Home / Self Care | Payer: Medicare HMO | Source: Ambulatory Visit | Attending: Family Medicine | Admitting: Family Medicine

## 2020-10-06 ENCOUNTER — Other Ambulatory Visit: Payer: Self-pay | Admitting: Family Medicine

## 2020-10-06 DIAGNOSIS — R52 Pain, unspecified: Secondary | ICD-10-CM

## 2020-10-07 ENCOUNTER — Telehealth: Payer: Self-pay

## 2020-10-07 NOTE — Telephone Encounter (Signed)
   [facility] Pre-operative Risk Assessment    Patient Name: [name].  DOB: October 10, 1940 MRN: [id number]    Request for surgical clearance:  What type of surgery is being performed Cervical Epidural   When is this surgery scheduled  TBD  What type of clearance is required Pharmacy  Are there any medications that need to be held prior to surgery and how long  Hold Pradaxa 2 days prior  Practice name and name of physician performing surgery   [facility]    Dr. not listed  What is the office phone number  [phone]   7.   What is the office fax number        [phone]  8.   Anesthesia type   Not listed    Kathyrn Lass 10/07/2020, 3:53 PM  _________________________________________________________________   (provider comments below)

## 2020-10-08 NOTE — Telephone Encounter (Signed)
   Name: [name].  DOB: [birth year]  MRN: [id number]   Primary Cardiologist: Peter Martinique, MD  Chart reviewed as part of pre-operative protocol coverage. Will route to pharmacy team to assist with Pradaxa. Pt also recently seen by [name] so can bundle finalized recommendation with his note.  Charlie Pitter, PA-C 10/08/2020, 11:37 AM

## 2020-10-13 NOTE — Telephone Encounter (Signed)
   Name: [name].  DOB: 1940/04/05  MRN: [id number]   Primary Cardiologist: Peter Martinique, MD  Chart reviewed as part of pre-operative protocol coverage.   Per pharmacy recommendations, patient can hold pradaxa 3 days prior to his upcoming spinal injection with plans to restart as soon as he is cleared to do so by his surgeon.   I will route this recommendation to the requesting party via Epic fax function and remove from pre-op pool. Please call with questions.  Abigail Butts, PA-C 10/13/2020, 11:52 AM

## 2020-10-13 NOTE — Telephone Encounter (Signed)
Patient with diagnosis of afib on Pradaxa for anticoagulation.    Procedure: cervical ESI Date of procedure: TBD  CHA2DS2-VASc Score = 3  This indicates a 3.2% annual risk of stroke. The patient's score is based upon: CHF History: No HTN History: Yes Diabetes History: No Stroke History: No Vascular Disease History: No Age Score: 2 Gender Score: 0  CrCl 82m/min Platelet count 260K  Previously cleared for same procedure 3 weeks ago, unclear if this is duplicate request or pt is already scheduled for another ESI. Per office protocol, patient can hold Pradaxa for 3 days prior to procedure (previously cleared to hold 4 days, renal function was worse at the time and has since improved so 3 day hold is sufficient).

## 2020-10-19 ENCOUNTER — Encounter: Payer: Self-pay | Admitting: Orthopaedic Surgery

## 2020-10-19 ENCOUNTER — Ambulatory Visit (INDEPENDENT_AMBULATORY_CARE_PROVIDER_SITE_OTHER): Payer: Medicare HMO | Admitting: Orthopaedic Surgery

## 2020-10-19 DIAGNOSIS — M1711 Unilateral primary osteoarthritis, right knee: Secondary | ICD-10-CM

## 2020-10-19 NOTE — Progress Notes (Signed)
Office Visit Note   Patient: [name].           Date of Birth: 04-03-1940           MRN: [id number] Visit Date: 10/19/2020              Requested by: Derinda Late, MD [address] [place],  [place] [address] PCP: Derinda Late, MD   Assessment & Plan: Visit Diagnoses:  1. Unilateral primary osteoarthritis, right knee     Plan: Since the patient is asymptomatic today, he does not prefer to have any type of treatment for his right knee and I agree with this as well.  He and his wife do understand that he has significant severe arthritis of the right knee and he will likely have episodes where the knee swells and gets more painful.  He can always call us to be seen more immediately if possible or even one of my partners to aspirate the knee and place a steroid injection if it gets bad enough for him.  All questions and concerns were answered and addressed.  Follow-Up Instructions: Return if symptoms worsen or fail to improve.   Orders:  No orders of the defined types were placed in this encounter.  No orders of the defined types were placed in this encounter.     Procedures: No procedures performed   Clinical Data: No additional findings.   Subjective: Chief Complaint  Patient presents with   Right Knee - Pain, Edema   Right Foot - Edema, Pain  The patient is a very pleasant 80 year old gentleman sent from Dr. Sandi Mariscal to evaluate his right knee pain and swelling.  He also has had some right foot pain.  When the referral was made to [place] he also had right knee swelling.  He says today he is not hurting at all with his knee or his foot.  He says it does not bother him to walk and he can walk without an assistive device.  HPI  Review of Systems Today there is no listed headache, chest pain, shortness of breath, fever, chills, nausea, vomiting  Objective: Vital Signs: There were no vitals taken for this visit.  Physical Exam He is alert and oriented  and in no acute distress Ortho Exam Examination of both knees shows bony swelling but no true effusion.  His right more painful knee has medial joint line tenderness and varus malalignment with significant patellofemoral crepitation.  He has a slight flexion contracture of both knees.  They are cool today and not warm.  There is no significant effusion today at all. Specialty Comments:  No specialty comments available.  Imaging: No results found. X-rays of of the right knee on the canopy system shows severe tricompartment arthritis of the knee with joint space narrowing and osteophytes as well as varus malalignment.  This involves all 3 compartments and is severe.  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   Pain due to onychomycosis of toenails of both feet 08/15/2018   Dermatitis associated with moisture from stool incontinence 08/14/2018   Gastric polyp    Incontinence of feces 08/19/2017   AAA (abdominal aortic aneurysm) ([place]) 06/30/2017   Anxiety 06/30/2017   Pancreatic  cyst 06/07/2017   Anosmia 02/25/2016   Chronic pansinusitis 02/25/2016   Impacted cerumen of both ears 02/25/2016   Noise effect on both inner ears 02/25/2016   Presbycusis of both ears 02/25/2016   Duodenal nodule    Renal cell carcinoma of left kidney ([place]) 11/11/2013   BPH (benign prostatic hyperplasia) 11/07/2011   Hip pain 07/04/2011   Perianal itch 07/04/2011   ED (erectile dysfunction) of organic origin 03/07/2011   OSA on CPAP 08/08/2010   Chronic obstructive asthma ([place]) 07/14/2010   Rhinitis 07/14/2010   Cough 07/13/2010   Barrett's esophagus 03/11/2009   Atrial fibrillation ([place]) 07/29/2008   GERD 07/29/2008   Past Medical  History:  Diagnosis Date   Allergy    Arrhythmia    Atrial fibrillation ([place])    Barrett esophagus    Barrett's esophagus    Blood transfusion without reported diagnosis    BPH (benign prostatic hyperplasia)    Cervical stenosis of spine    Chronic obstructive asthma    Difficult intubation 13 or 14 years ago, at [facility] long   airway was up near nostrils with intubation per patient with bowel surgery   ED (erectile dysfunction)    GERD (gastroesophageal reflux disease)    Hammer toe of right foot    Hiatal hernia    Hyperlipidemia    Hypertension    OSA (obstructive sleep apnea) 08/05/10 [facility]   AHI 24   Paroxysmal atrial fibrillation ([facility])    Hospitalized on 05/27/10   Renal cell carcinoma 2009   right renal mass, followed by Dr. Ander Slade   Sleep apnea     Family History  Problem Relation Age of Onset   Stroke Mother    Diabetes Mother    Obesity Brother    Diabetes Brother    Diabetes Father    Asthma Paternal Grandmother    Heart attack Paternal Grandfather    Kidney cancer Paternal Aunt    Colon cancer Neg Hx    Esophageal cancer Neg Hx    Rectal cancer Neg Hx    Stomach cancer Neg Hx     Past Surgical History:  Procedure Laterality Date   BASAL CELL CARCINOMA EXCISION     BIOPSY  12/18/2017   Procedure: BIOPSY;  Surgeon: [name], MD;  [facility]: [facility];  Service: Endoscopy;;   ESOPHAGOGASTRODUODENOSCOPY (EGD) WITH PROPOFOL N/A 08/26/2014   Procedure: ESOPHAGOGASTRODUODENOSCOPY (EGD) WITH PROPOFOL;  Surgeon: [name], MD;  [facility]: [facility];  Service: Endoscopy;  Laterality: N/A;   ESOPHAGOGASTRODUODENOSCOPY (EGD) WITH PROPOFOL N/A 12/18/2017   Procedure: ESOPHAGOGASTRODUODENOSCOPY (EGD) WITH PROPOFOL;  Surgeon: [name], MD;  Location: [facility];  Service: Endoscopy;  Laterality: N/A;   EXPLORATORY LAPAROTOMY W/ BOWEL RESECTION  12/1999   segmental sbr of a hemangioma, repair of incarcerated umbilical hernia   exploratory  laparotomy, segmental small bowel resection of a hemangioma, repair of incarcerated umbelical hernia  82/9562   EYE SURGERY     cataracts done   renal cancer surgery with RF Ablation     TONSILLECTOMY     Social History   Occupational History   Occupation: tv Investment banker, operational   Occupation: Warehouse manager  Tobacco Use   Smoking status: Former    Types: Cigars    Quit date: 02/29/1980    Years since quitting: 40.6   Smokeless tobacco: Never   Tobacco comments:    quit when he was 80 years old  Vaping Use   Vaping Use: Never used  Substance and Sexual Activity   Alcohol use: No   Drug use: No   Sexual activity: Not Currently

## 2020-10-20 ENCOUNTER — Ambulatory Visit
Admission: RE | Admit: 2020-10-20 | Discharge: 2020-10-20 | Disposition: A | Discharge status: Home / Self Care | Payer: Medicare HMO | Source: Ambulatory Visit | Attending: Family Medicine | Admitting: Family Medicine

## 2020-10-20 DIAGNOSIS — M5412 Radiculopathy, cervical region: Secondary | ICD-10-CM

## 2020-10-20 NOTE — Discharge Instructions (Signed)
Post Procedure Spinal Discharge Instruction Sheet  You may resume a regular diet and any medications that you routinely take (including pain medications) unless otherwise noted by MD.  No driving day of procedure.  Light activity throughout the rest of the day.  Do not do any strenuous work, exercise, bending or lifting.  The day following the procedure, you can resume normal physical activity but you should refrain from exercising or physical therapy for at least three days thereafter.  You may apply ice to the injection site, 20 minutes on, 20 minutes off, as needed. Do not apply ice directly to skin.    Common Side Effects:  Headaches- take your usual medications as directed by your physician.  Increase your fluid intake.  Caffeinated beverages may be helpful.  Lie flat in bed until your headache resolves.  Restlessness or inability to sleep- you may have trouble sleeping for the next few days.  Ask your referring physician if you need any medication for sleep.  Facial flushing or redness- should subside within a few days.  Increased pain- a temporary increase in pain a day or two following your procedure is not unusual.  Take your pain medication as prescribed by your referring physician.  Leg cramps  Please contact our office at [phone] for the following symptoms: Fever greater than 100 degrees. Headaches unresolved with medication after 2-3 days. Increased swelling, pain, or redness at injection site.   Thank you for visiting [facility] today.    YOU MAY RESUME YOUR PRADAXA 24 HOURS AFTER INJECTION.

## 2020-12-19 ENCOUNTER — Other Ambulatory Visit: Payer: Self-pay | Admitting: Cardiology

## 2020-12-30 ENCOUNTER — Encounter: Payer: Self-pay | Admitting: Podiatry

## 2020-12-30 ENCOUNTER — Ambulatory Visit (INDEPENDENT_AMBULATORY_CARE_PROVIDER_SITE_OTHER): Payer: Medicare HMO | Admitting: Podiatry

## 2020-12-30 ENCOUNTER — Other Ambulatory Visit: Payer: Self-pay

## 2020-12-30 DIAGNOSIS — B351 Tinea unguium: Secondary | ICD-10-CM | POA: Diagnosis not present

## 2020-12-30 DIAGNOSIS — M79675 Pain in left toe(s): Secondary | ICD-10-CM | POA: Diagnosis not present

## 2020-12-30 DIAGNOSIS — D689 Coagulation defect, unspecified: Secondary | ICD-10-CM

## 2020-12-30 DIAGNOSIS — M79674 Pain in right toe(s): Secondary | ICD-10-CM | POA: Diagnosis not present

## 2020-12-30 NOTE — Progress Notes (Signed)
This patient returns to my office for at risk foot care.  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  Patient is taking pradaxa.This patient is unable to cut nails himself since the patient cannot reach his nails.These nails are painful walking and wearing shoes.  This patient presents for at risk foot care today.  General Appearance  Alert, conversant and in no acute stress.  Vascular  Dorsalis pedis  Are weakly  palpable  bilaterally. Posterior tibial pulses are absent  Bilateral. Capillary return is within normal limits  Bilateral.  Cold feet.  Bilaterally. Absent hair noted.  Neurologic  Senn-Weinstein monofilament wire test within normal limits  bilaterally. Muscle power within normal limits bilaterally.  Nails Thick disfigured discolored nails with subungual debris  from hallux to fifth toes bilaterally. No evidence of bacterial infection or drainage bilaterally.  Orthopedic  No limitations of motion  feet .  No crepitus or effusions noted.  No bony pathology or digital deformities noted.  Hammer toes 2,3 right foot.  Skin  normotropic skin with no porokeratosis noted bilaterally.  No signs of infections or ulcers noted.  Asymptomatic diffuse callus right forefoot.   Onychomycosis  Pain in right toes  Pain in left toes  Consent was obtained for treatment procedures.   Mechanical debridement of nails 1-5  bilaterally performed with a nail nipper.  Filed with dremel without incident.    Return office visit   3 months.                  Told patient to return for periodic foot care and evaluation due to potential at risk complications.   Gardiner Barefoot DPM

## 2021-03-26 ENCOUNTER — Other Ambulatory Visit: Payer: Self-pay | Admitting: Family Medicine

## 2021-03-26 DIAGNOSIS — F0390 Unspecified dementia without behavioral disturbance: Secondary | ICD-10-CM

## 2021-03-30 ENCOUNTER — Ambulatory Visit: Payer: Medicare HMO | Admitting: Pulmonary Disease

## 2021-03-30 ENCOUNTER — Other Ambulatory Visit: Payer: Self-pay

## 2021-03-30 ENCOUNTER — Encounter: Payer: Self-pay | Admitting: Pulmonary Disease

## 2021-03-30 VITALS — BP 118/64 | HR 75 | Temp 97.7°F | Ht 70.0 in | Wt 176.2 lb

## 2021-03-30 DIAGNOSIS — Z9989 Dependence on other enabling machines and devices: Secondary | ICD-10-CM | POA: Diagnosis not present

## 2021-03-30 DIAGNOSIS — J4520 Mild intermittent asthma, uncomplicated: Secondary | ICD-10-CM | POA: Diagnosis not present

## 2021-03-30 DIAGNOSIS — G4733 Obstructive sleep apnea (adult) (pediatric): Secondary | ICD-10-CM

## 2021-03-30 NOTE — Patient Instructions (Signed)
Will arrange for new auto CPAP machine through [employer]  Follow up in 5 months

## 2021-03-30 NOTE — Progress Notes (Signed)
[facility]  Chief Complaint  Patient presents with   Follow-up    Cpap compliance, needs new machine    Past Surgical History:  He  has a past surgical history that includes exploratory laparotomy, segmental small bowel resection of a hemangioma, repair of incarcerated umbelical hernia (79/8921); Exploratory laparotomy w/ bowel resection (12/1999); renal cancer surgery with RF Ablation; Tonsillectomy; Excision basal cell carcinoma; Eye surgery; Esophagogastroduodenoscopy (egd) with propofol (N/A, 08/26/2014); Esophagogastroduodenoscopy (egd) with propofol (N/A, 12/18/2017); and biopsy (12/18/2017).  Past Medical History:  Allergies, A fib, Barrett's esophagus, BPH, Cervical spinal stenosis, ED, Hiatal hernia, HLD, HTN, Renal call carcinoma 2009  Constitutional:  BP 118/64 (BP Location: Left Arm, Cuff Size: Normal)    Pulse 75    Temp 97.7 F (36.5 C) (Oral)    Ht _0  (1.778 m)    Wt 176 lb 3.2 oz (79.9 kg)    SpO2 99%    BMI 25.28 kg/m   Brief Summary:  [name]. is a 81 y.o. 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      Subjective:   I last saw him in 2019.  More recently seen by nurse practitioners.  He is having more trouble with his memory.  He reports using CPAP nightly, but can't remember what type of mask he has been using.  His machine is more than 83 yrs old.  He is not having cough, wheeze, sputum, or chest congestion.  Hasn't needed to use albuterol.  Physical Exam:   Appearance - well kempt   ENMT - no sinus tenderness, no oral exudate, no LAN, Mallampati 3 airway, no stridor  Respiratory - equal breath sounds bilaterally, no wheezing or rales  CV - s1s2 regular rate and rhythm, no murmurs  Ext - no clubbing, no edema  Skin - no rashes  Psych - normal mood and affect   Pulmonary testing:  PFT 07/26/11>>FEV1 2.62(91%), FEV1% 66, TLC 6.55(101%), DLCO 94%, +BD  Sleep Tests:  PSG  08/05/10>>AHI 24.2, SpO2 low 77%, PLMI 15.8. PSG with oral appliance 02/07/12>>AHI 11.5, SpO2 low 78%. Both obstructive and central events. Auto CPAP 01/14/14 to 02/12/14 >> used on 17 of 30 nights with average 4 hrs and 55 min.  Average AHI is 11 with median CPAP 7 cm H2O and 95 th percentile CPAP 9 cm H20.  Mostly central events.  Social History:  He  reports that he quit smoking about 41 years ago. His smoking use included cigars. He has never used smokeless tobacco. He reports that he does not drink alcohol and does not use drugs.  Family History:  His family history includes Asthma in his paternal grandmother; Diabetes in his brother, father, and mother; Heart attack in his paternal grandfather; Kidney cancer in his paternal aunt; Obesity in his brother; Stroke in his mother.     Assessment/Plan:   Obstructive sleep apnea. - he is compliant with CPAP and reports benefit from therapy - uses [employer] for his DME - his current device is more than 83 yrs old - will arrange for new [employer] auto CPAP 5 to 15 cm H2O  Mild COPD with asthma. - continue singulair 10 mg nightly - prn albuterol  Multiple myeloma. - followed by Dr. Cherylann Ratel with [facility]  Time Spent Involved in Patient Care on Day of Examination:  36 minutes  Follow up:   Patient Instructions  Will arrange for new auto CPAP machine through [facility]  Follow up in 5 months  Medication List:   Allergies as of 03/30/2021       Reactions   Tamsulosin Other (See Comments)   dizziness   Proair Hfa [albuterol] Palpitations   Only use in emergency due to has heart problems        Medication List        Accurate as of March 30, 2021 12:30 PM. If you have any questions, ask your nurse or doctor.          acyclovir 400 MG tablet Commonly known as: ZOVIRAX Take 1 tablet by mouth twice daily for VZV (Shingles) prophylaxis.   albuterol 108 (90 Base) MCG/ACT inhaler Commonly known as: VENTOLIN  HFA Inhale into the lungs.   alfuzosin 10 MG 24 hr tablet Commonly known as: UROXATRAL Take 1 tablet by mouth daily.   AMBULATORY NON FORMULARY MEDICATION CPAP at night   AMBULATORY NON FORMULARY MEDICATION [place]  Take 1 tablet twice a day   calcium citrate-vitamin D 315-200 MG-UNIT tablet Commonly known as: CITRACAL+D Take by mouth.   diltiazem 180 MG 24 hr capsule Commonly known as: CARDIZEM CD Take 1 capsule (180 mg total) by mouth daily.   donepezil 10 MG tablet Commonly known as: ARICEPT Take 10 mg by mouth daily.   dutasteride 0.5 MG capsule Commonly known as: AVODART Take 0.5 mg by mouth daily.   lenalidomide 10 MG capsule Commonly known as: REVLIMID Take 1 capsule (10 mg) by mouth on days 1-21 of each 28 day cycle. Adult Male Auth # [id number] Date 02/27/20   memantine 10 MG tablet Commonly known as: NAMENDA   metoprolol succinate 50 MG 24 hr tablet Commonly known as: TOPROL-XL TAKE 1 TABLET BY MOUTH EVERY DAY WITH OR FOLLOWING A MEAL   montelukast 10 MG tablet Commonly known as: SINGULAIR TAKE 1 TABLET(10 MG) BY MOUTH AT BEDTIME   omeprazole 20 MG capsule Commonly known as: PRILOSEC TAKE ONE CAPSULE BY MOUTH EVERY DAY   polyethylene glycol 17 g packet Commonly known as: MIRALAX / GLYCOLAX Take by mouth daily as needed.   Pradaxa 150 MG Caps capsule Generic drug: dabigatran TAKE ONE CAPSULE BY MOUTH TWICE DAILY   pravastatin 20 MG tablet Commonly known as: PRAVACHOL Take 20 mg daily by mouth.   tadalafil 5 MG tablet Commonly known as: CIALIS Take 5 mg by mouth as needed.   terbinafine 250 MG tablet Commonly known as: LAMISIL        Signature:  [name], MD [place] Pager - [phone] 03/30/2021, 12:30 PM

## 2021-04-02 ENCOUNTER — Ambulatory Visit: Payer: Medicare HMO | Admitting: Podiatry

## 2021-04-02 ENCOUNTER — Other Ambulatory Visit: Payer: Self-pay

## 2021-04-02 ENCOUNTER — Encounter: Payer: Self-pay | Admitting: Podiatry

## 2021-04-02 DIAGNOSIS — D689 Coagulation defect, unspecified: Secondary | ICD-10-CM

## 2021-04-02 DIAGNOSIS — M79675 Pain in left toe(s): Secondary | ICD-10-CM | POA: Diagnosis not present

## 2021-04-02 DIAGNOSIS — M79674 Pain in right toe(s): Secondary | ICD-10-CM

## 2021-04-02 DIAGNOSIS — B351 Tinea unguium: Secondary | ICD-10-CM | POA: Diagnosis not present

## 2021-04-02 NOTE — Progress Notes (Signed)
This patient returns to my office for at risk foot care.  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  Patient is taking pradaxa.This patient is unable to cut nails himself since the patient cannot reach his nails.These nails are painful walking and wearing shoes.  This patient presents for at risk foot care today.  General Appearance  Alert, conversant and in no acute stress.  Vascular  Dorsalis pedis  Are weakly  palpable  bilaterally. Posterior tibial pulses are absent  Bilateral. Capillary return is within normal limits  Bilateral.  Cold feet.  Bilaterally. Absent hair noted.  Neurologic  Senn-Weinstein monofilament wire test within normal limits  bilaterally. Muscle power within normal limits bilaterally.  Nails Thick disfigured discolored nails with subungual debris  from hallux to fifth toes bilaterally. No evidence of bacterial infection or drainage bilaterally.  Orthopedic  No limitations of motion  feet .  No crepitus or effusions noted.  No bony pathology or digital deformities noted.  Hammer toes 2,3 right foot.  Skin  normotropic skin with no porokeratosis noted bilaterally.  No signs of infections or ulcers noted.  Asymptomatic diffuse callus right forefoot.   Onychomycosis  Pain in right toes  Pain in left toes  Consent was obtained for treatment procedures.   Mechanical debridement of nails 1-5  bilaterally performed with a nail nipper.  Filed with dremel without incident.    Return office visit   3 months.                  Told patient to return for periodic foot care and evaluation due to potential at risk complications.   Gardiner Barefoot DPM

## 2021-04-05 ENCOUNTER — Other Ambulatory Visit: Payer: Self-pay | Admitting: Family Medicine

## 2021-04-06 LAB — SARS CORONAVIRUS 2 (TAT 6-24 HRS): SARS Coronavirus 2: NEGATIVE

## 2021-04-08 ENCOUNTER — Ambulatory Visit
Admission: RE | Admit: 2021-04-08 | Discharge: 2021-04-08 | Disposition: A | Discharge status: Home / Self Care | Payer: Medicare HMO | Source: Ambulatory Visit | Attending: Family Medicine | Admitting: Family Medicine

## 2021-04-08 DIAGNOSIS — F0390 Unspecified dementia without behavioral disturbance: Secondary | ICD-10-CM

## 2021-04-08 MED ORDER — GADOBENATE DIMEGLUMINE 529 MG/ML IV SOLN
15.0000 mL | Freq: Once | INTRAVENOUS | Status: AC | PRN
  Administered 2021-04-08: 15 mL via INTRAVENOUS

## 2021-06-15 ENCOUNTER — Other Ambulatory Visit: Payer: Self-pay | Admitting: Cardiology

## 2021-06-17 NOTE — Telephone Encounter (Signed)
Prescription refill request for Pradaxa received.  ?Indication:Afib ?Last office visit:8/22 ?Weight:79.9 kg ?Age:81 ?Scr:1.4 ?CrCl:45.97  ml/min ? ?Prescription refilled ? ?

## 2021-06-21 ENCOUNTER — Ambulatory Visit: Payer: Medicare HMO | Admitting: Neurology

## 2021-06-21 ENCOUNTER — Encounter: Payer: Self-pay | Admitting: Neurology

## 2021-06-21 VITALS — BP 117/71 | HR 75 | Ht 70.0 in | Wt 175.2 lb

## 2021-06-21 DIAGNOSIS — G20 Parkinson's disease: Secondary | ICD-10-CM

## 2021-06-21 DIAGNOSIS — F0390 Unspecified dementia without behavioral disturbance: Secondary | ICD-10-CM | POA: Diagnosis not present

## 2021-06-21 DIAGNOSIS — G20C Parkinsonism, unspecified: Secondary | ICD-10-CM

## 2021-06-21 DIAGNOSIS — R2689 Other abnormalities of gait and mobility: Secondary | ICD-10-CM

## 2021-06-21 DIAGNOSIS — W19XXXA Unspecified fall, initial encounter: Secondary | ICD-10-CM

## 2021-06-21 DIAGNOSIS — R269 Unspecified abnormalities of gait and mobility: Secondary | ICD-10-CM | POA: Diagnosis not present

## 2021-06-21 NOTE — Progress Notes (Signed)
?[facility] ? ? ? ?Provider:  Dr Jaynee Eagles ?Requesting Provider: Derinda Late, MD ?Primary Care Provider:  Derinda Late, MD ? ?CC:  dementia and cognitive decline ? ?HPI:  [name]. is a 81 y.o. male here as requested by Derinda Late, MD for dementia and cognitive decline. PMhx of fatigue, somnolence improved off of lorazepam, insomnia improved, multiple myeloma, mild cognitive impairment, A-fib and BPH.  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 Sandi Mariscal.Marland Kitchen  He is already on donepezil and memantine for his cognitive problems.  I reviewed Dr. Deboraha Sprang notes, patient on donepezil and memantine at 1 point wife had taken him off because of the possibility of sedation, he was taking this for cognitive impairment, also being treated for multiple myeloma and his chemotherapy seems to cause more fatigue around few days he has been treating each month, he has been on CPAP for many years and believes he is sleeping well on it usually goes to bed about 930 and wakes at 7 and usually feels rested when he wakes up in the morning, CPAP chronically and is using it properly, he does have nocturia and BPH and increased frequency, mild intermittent asthma, GERD and Barrett's esophagus, diminished hearing, erectile dysfunction, recurrent back pain and arthralgias, memory loss, basal cell carcinoma of upper lip in 2006, right renal cancer in 2009 treated with radiofrequency ablation, A-fib since 2010, obstructive sleep apnea since 2012, cervical spinal stenosis since 2016, venous insufficiency  since 2016 and pedal edema, chronic kidney disease stage III since 2017, DM.  He has cognitive impairment with slowly increasing symptoms of trouble with short-term memory and executive function, he particularly has trouble keeping track of his medications, he has been given written instructions several times but loses the instructions, despite being confused about his medications the patient states he does not really want any assistance loading his pill container.  Patient's son spoke to Dr. Sandi Mariscal is concerned about his symptoms and is thinking it might be better if the patient had another family member go with him to visits, patient's partner has been accompanying him to his oncology visit at [facility], he is on donepezil and amantadine for dementia but has not shown any significant improvement and seems to be slowly getting worse, they do have potential for being sedated and as above it appears partner has taken him off of them at least at 1 point, will find out today if he is on them currently.  Dr. Sandi Mariscal has tapered him off and managed medications to try to improve his cognition and somnolence, such as tapering him off lorazepam, adjusting his beta-blocker and other medications.  Patient has had recent studies with pyuria, repeat UA has been checked, recent MRI of the brain unremarkable per notes.  Mini-Mental status exam dated 04/23/2021 showed a 12 out of 25.  B12 per notes was 310 with a normal methylmalonic acid. ? 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 He forgets names, long term is ok. If he write sit in the book. More in the past year. Son has noticed in [place] he kept forgetting which balcony went to the ocean 3 years ago, in [place] he forgot about landing at a small runway. He was an Agricultural engineer for 5 years. A little worse in the afternoons. Mother had a stroke complicated by dementia, otherwise parents did not have dementia. He has a pill dispenser that he actually fills. No REM sleep disorder, he fell getting into the shower recently and does not remember, he gets on a bike, he walks on a nice day n the [place], recumbent bike every day. Some shuffling. No freezing gait. He has a Optician, dispensing and with action, right > left, not at rest. His handwriting is smaller. Volume of voice a bot lower. No runny nose, a little dizziness when walking or when standing not significant, no drooling or wet pillows.  ? ?Reviewed notes, labs and imaging from outside physicians, which showed: see above ? ?MRI brain 04/08/2021 personally reviewed additional 10 minutes of time: EXAM: ?MRI HEAD WITHOUT AND WITH CONTRAST ?  ?TECHNIQUE: ?Multiplanar, multiecho pulse sequences of the brain and surrounding ?structures were obtained without and with intravenous contrast. ?  ?CONTRAST:  24mL MULTIHANCE GADOBENATE DIMEGLUMINE 529 MG/ML IV SOLN ?  ?COMPARISON:  None. ?  ?FINDINGS: ?Brain: Cerebral volume loss which is fairly typical for age on the ?subjective basis. Mild for age chronic small vessel ischemia in the ?hemispheric white matter. No gray matter infarct, hemorrhage, ?hydrocephalus, intra-axial mass, or collection. No chronic blood ?products. ?  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 No mass effect on adjacent structures. ?  ?Vascular: Normal flow voids and vascular enhancements. Suspected ?atheromatous plaque at the left V4 segment. ?  ?Skull and upper cervical spine: Normal marrow signal ?  ?Sinuses/Orbits: Negative ?  ?IMPRESSION: ?Aging brain without specific or reversible cause for symptoms. ? ?Review of Systems: ?Patient complains of symptoms per HPI as well as the following symptoms memory loss. Pertinent negatives and positives per HPI. All others negative. ? ? ?Social History  ? ?Socioeconomic History  ? Marital status: Divorced  ?  Spouse name: Not on file  ? Number of children: 4  ? Years of education: Not on file  ? Highest education level: Not on file  ?Occupational History  ? Occupation: tv producer/construction  ? Occupation: Warehouse manager  ?Tobacco Use  ? Smoking status: Former  ?  Types: Cigars  ?  Quit date: 02/29/1980  ?  Years since quitting: 41.3  ? Smokeless tobacco: Never  ? Tobacco comments:  ?  quit when he was 81 years old  ?Vaping Use  ? Vaping Use: Never used  ?Substance and Sexual Activity  ? Alcohol use: No  ?  Drug use: No  ? Sexual activity: Not Currently  ?Other Topics Concern  ? Not on file  ?Social History Narrative  ? ** Merged History Encounter ** Married. Education: [employer]. Exercise: Eleptical times a week for 45 minutes.  ?   ?    ? ?Social Determinants of Health  ? ?Financial Resource Strain: Not on file  ?Food Insecurity: Not on file  ?Transportation Needs: Not on file  ?Physical Activity: Not on file  ?Stress: Not on file  ?Social Connections: Not on file  ?Intimate Partner Violence: Not on file  ? ? ?Family History  ?Problem Relation Age of Onset  ? Stroke Mother   ? Diabetes Mother   ? Diabetes Father   ? Obesity Brother   ? Diabetes Brother   ? Kidney cancer Paternal Aunt   ? Asthma Paternal Grandmother   ? Heart attack Paternal Grandfather   ? Colon cancer Neg Hx   ? Esophageal cancer Neg Hx   ? Rectal cancer Neg Hx    ? Stomach cancer Neg Hx   ? Dementia Neg Hx   ? Alzheimer's disease Neg Hx   ? ? ?Past Medical History:  ?Diagnosis Date  ? Allergy   ? Arrhythmia   ? Atrial fibrillation ([place])   ? Barrett esophagus   ? Barrett's

## 2021-06-21 NOTE — Patient Instructions (Addendum)
DAT Scan ?Formal neurocognitive testing ?Formal driving test ?Will contact for follow up when testing is complete ? ?Brain DaTscan ?How to prepare and what to expect ?What is a brain DaTscan? ?A brain DaTscan is a nuclear medicine scan. It uses radioactive material to diagnose some diseases of the brain, especially those that cause tremor (shakiness). ?DaTscan is a brand name for a drug called ioflupane I-123. A brain DaTscan is a form of radiology, because radiation is used to take pictures of the body. ?This radioactive drug is ordered especially for you. Because of this, we need at least 72 hours? notice if you must cancel or reschedule your scan. ?  ?How does the scan work? ?You will be given a small dose of tracer (radioactive material) through an intravenous (IV) line. This tracer will collect in part of your brain and give off gamma rays. A special camera called a gamma camera will use these rays to produce pictures and measurements of your brain. ?How do I prepare? ?Some drugs will affect the results of your brain DaTscan. You will need to stop taking these drugs before your scan. ?The table on page 2 lists the drugs that need to be stopped, and for how many days before your scan. This list is in alphabetical order by the generic name of the drug. The common brand names are listed beneath the generic name. ?Please confirm these instructions with your doctor who prescribed the drug. ?Drugs to Stop Taking ?Before your scan, stop taking these medicines for the length of time shown: ?Name of Drug Stop Taking  ?Amoxapine 4 days before  ?Benztropine  ?Cogentin 3 days before  ?Bupropion (Aplenzin, Budeprion, Voxra, Wellbutrin, Zyban) 48 hours before  ?Buspirone 15 hours before  ?Citalopram 24 hours before  ?Cocaine 6 hours before  ?Escitalopram 24 hours before  ?Methamphetamine 24 hours before  ?Methylphenidate (Concerta, Metadate, Methylin, Ritalin) 20 hours before  ?Paroxetine 24 hours before  ?Selegilene 48  hours before  ?Sertraline 3 days before  ?If you are breastfeeding, or if there is any chance you are pregnant, please tell the scheduler or technologist (the person who will help you prepare for your scan). ?How is the scan done? ?When you first arrive, we will ask you to drink a small cup of water with potassium iodine in it. This water may have a metallic taste.  ?An hour after you drink the potassium iodine water, the technologist will inject a small amount of tracer into a vein in your arm or hand through your IV.  ?You must stay in the department for 30 minutes after the injection.  ?You will then have a break for 3 hours. It is OK to eat and drink during this break.  ?You must return to the clinic after this 3-hour break to have images of your brain taken.  ?Then, 4 hours after you receive your tracer injection, the technologist will take images of your brain with the gamma camera. You will lie flat on the exam table while these images are being taken.  ?You must not move while the camera is taking pictures. If you move, the pictures will be blurry and may have to be taken again.  ?Taking the images will take 40 to 45 minutes. Your total time in the imaging room will be about 1 hour.  ?You may also have a low-dose CT scan of your brain to help confirm any results. A CT scan is another way to take images inside your body.  ?It  will take about 5? hours from the time you drink the potassium iodine water until the scans are complete. ?What will I feel during the scan? ?The technologist will help make you as comfortable as possible on the exam table for the scan.  ?You may feel some minor discomfort from the IV.  ?Lying still on the exam table may be hard for some patients.  ?The camera will be close to your head. This may make you feel confined or uneasy (claustrophobic). Please tell the doctor who referred you for this scan if you know you are claustrophobic. ?Are there any side effects from the scan? ?Most of  the radioactivity from the tracer will pass out of your body in your urine or stool. The rest simply goes away over time.  ?Bad reactions to this scan are very rare. Fewer than 1% of patients (fewer than 1 out of 100) have a bad reaction. Reactions may include headache, nausea, vertigo (dizziness), or dry mouth. ?How do I get the results? ?When the test is over, the nuclear medicine doctor will review your images, prepare a written report, and talk with your doctor about the results. Your doctor will then talk with you about the results and your treatment options. ?If you needed to stop taking any medicines on the day of your scan, ask your doctor when to start taking them again. ? ?The potentially interfering drugs consist of: amoxapine, amphetamine, benztropine, bupropion, buspirone, citalopram, cocaine, mazindol, methamphetamine, methylphenidate, norephedrine, phentermine, escitalopram,  phenylpropanolamine, selegiline, paroxetine, and sertraline  ? ?

## 2021-06-22 ENCOUNTER — Encounter: Payer: Self-pay | Admitting: Neurology

## 2021-06-23 ENCOUNTER — Telehealth: Payer: Self-pay | Admitting: Neurology

## 2021-06-23 ENCOUNTER — Telehealth: Payer: Self-pay | Admitting: *Deleted

## 2021-06-23 NOTE — Telephone Encounter (Signed)
-----   Message from Melvenia Beam, MD sent at 06/22/2021  8:53 PM EDT ----- ?Regarding: driving test ?Is refer patient to formal driving test ? ?

## 2021-06-23 NOTE — Telephone Encounter (Signed)
Referral for Psychology sent to Dr. Hazle Coca [phone]. ?

## 2021-06-23 NOTE — Telephone Encounter (Signed)
Referral for formal driving test with [employer] [facility] filled out and faxed to [employer] per Dr Jaynee Eagles. Fax not going through yet. I have emailed referral securely to [email or web address]. ? ?

## 2021-06-28 ENCOUNTER — Telehealth: Payer: Self-pay | Admitting: Neurology

## 2021-06-28 NOTE — Telephone Encounter (Signed)
[employer] auth: no Roxanna Mew spoke to Bing Quarry ref # [id number] order sent to nuclear Medicine. They will reach out to the patient to schedule.  ?

## 2021-07-02 ENCOUNTER — Encounter: Payer: Self-pay | Admitting: Podiatry

## 2021-07-02 ENCOUNTER — Other Ambulatory Visit: Payer: Self-pay | Admitting: Family Medicine

## 2021-07-02 ENCOUNTER — Ambulatory Visit: Payer: Medicare HMO | Admitting: Podiatry

## 2021-07-02 DIAGNOSIS — M21621 Bunionette of right foot: Secondary | ICD-10-CM

## 2021-07-02 DIAGNOSIS — M79674 Pain in right toe(s): Secondary | ICD-10-CM

## 2021-07-02 DIAGNOSIS — B351 Tinea unguium: Secondary | ICD-10-CM | POA: Diagnosis not present

## 2021-07-02 DIAGNOSIS — M79675 Pain in left toe(s): Secondary | ICD-10-CM

## 2021-07-02 DIAGNOSIS — D689 Coagulation defect, unspecified: Secondary | ICD-10-CM | POA: Diagnosis not present

## 2021-07-02 DIAGNOSIS — E041 Nontoxic single thyroid nodule: Secondary | ICD-10-CM

## 2021-07-02 NOTE — Progress Notes (Signed)
This patient returns to my office for at risk foot care.  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  Patient is taking pradaxa.This patient is unable to cut nails himself since the patient cannot reach his nails.These nails are painful walking and wearing shoes.  This patient presents for at risk foot care today.  General Appearance  Alert, conversant and in no acute stress.  Vascular  Dorsalis pedis  are weakly  palpable  bilaterally. Posterior tibial pulses are absent  Bilateral. Capillary return is within normal limits  Bilateral.  Cold feet.  Bilaterally. Absent hair noted.  Neurologic  Senn-Weinstein monofilament wire test within normal limits  bilaterally. Muscle power within normal limits bilaterally.  Nails Thick disfigured discolored nails with subungual debris  from hallux to fifth toes bilaterally. No evidence of bacterial infection or drainage bilaterally.  Orthopedic  No limitations of motion  feet .  No crepitus or effusions noted.  No bony pathology or digital deformities noted.  Hammer toes 2,3 right foot.  Skin  normotropic skin with no porokeratosis noted bilaterally.  No signs of infections or ulcers noted.  Asymptomatic diffuse callus right forefoot.   Onychomycosis  Pain in right toes  Pain in left toes  Consent was obtained for treatment procedures.   Mechanical debridement of nails 1-5  bilaterally performed with a nail nipper.  Filed with dremel without incident.    Return office visit   3 months.                  Told patient to return for periodic foot care and evaluation due to potential at risk complications.   Osa Campoli DPM  

## 2021-07-08 ENCOUNTER — Ambulatory Visit
Admission: RE | Admit: 2021-07-08 | Discharge: 2021-07-08 | Disposition: A | Discharge status: Home / Self Care | Payer: Medicare HMO | Source: Ambulatory Visit | Attending: Family Medicine | Admitting: Family Medicine

## 2021-07-08 DIAGNOSIS — E041 Nontoxic single thyroid nodule: Secondary | ICD-10-CM

## 2021-07-15 ENCOUNTER — Encounter: Payer: Self-pay | Admitting: Psychology

## 2021-07-15 ENCOUNTER — Telehealth: Payer: Self-pay | Admitting: Neurology

## 2021-07-15 DIAGNOSIS — R269 Unspecified abnormalities of gait and mobility: Secondary | ICD-10-CM

## 2021-07-15 DIAGNOSIS — G20 Parkinson's disease: Secondary | ICD-10-CM

## 2021-07-15 DIAGNOSIS — G20C Parkinsonism, unspecified: Secondary | ICD-10-CM

## 2021-07-15 DIAGNOSIS — R2689 Other abnormalities of gait and mobility: Secondary | ICD-10-CM

## 2021-07-15 DIAGNOSIS — W19XXXA Unspecified fall, initial encounter: Secondary | ICD-10-CM

## 2021-07-15 DIAGNOSIS — F0390 Unspecified dementia without behavioral disturbance: Secondary | ICD-10-CM

## 2021-07-15 NOTE — Telephone Encounter (Signed)
Pt's significant other called wanting to know if the pt can be referred to another provider besides Dr. Melvyn Novas. Dr. Melvyn Novas first available is in Feb. Please advise.

## 2021-07-19 NOTE — Telephone Encounter (Signed)
Pt wife came to office, is wanting to know where else pt can go besides Dr. Melvyn Novas, would like to know if pt will need a referral and where to go. Would like a call.

## 2021-07-19 NOTE — Telephone Encounter (Signed)
Referral for Neuropsychology sent to [facility] [phone]. 

## 2021-07-22 ENCOUNTER — Encounter (HOSPITAL_COMMUNITY)
Admission: RE | Admit: 2021-07-22 | Discharge: 2021-07-22 | Disposition: A | Discharge status: Home / Self Care | Payer: Medicare HMO | Source: Ambulatory Visit | Attending: Neurology | Admitting: Neurology

## 2021-07-22 DIAGNOSIS — J4520 Mild intermittent asthma, uncomplicated: Secondary | ICD-10-CM | POA: Diagnosis not present

## 2021-07-22 DIAGNOSIS — R2689 Other abnormalities of gait and mobility: Secondary | ICD-10-CM | POA: Diagnosis present

## 2021-07-22 DIAGNOSIS — G4733 Obstructive sleep apnea (adult) (pediatric): Secondary | ICD-10-CM | POA: Insufficient documentation

## 2021-07-22 DIAGNOSIS — R269 Unspecified abnormalities of gait and mobility: Secondary | ICD-10-CM | POA: Diagnosis present

## 2021-07-22 DIAGNOSIS — N183 Chronic kidney disease, stage 3 unspecified: Secondary | ICD-10-CM | POA: Diagnosis not present

## 2021-07-22 DIAGNOSIS — K2270 Barrett's esophagus without dysplasia: Secondary | ICD-10-CM | POA: Insufficient documentation

## 2021-07-22 DIAGNOSIS — K219 Gastro-esophageal reflux disease without esophagitis: Secondary | ICD-10-CM | POA: Diagnosis not present

## 2021-07-22 DIAGNOSIS — C9000 Multiple myeloma not having achieved remission: Secondary | ICD-10-CM | POA: Insufficient documentation

## 2021-07-22 DIAGNOSIS — G20 Parkinson's disease: Secondary | ICD-10-CM | POA: Diagnosis present

## 2021-07-22 DIAGNOSIS — W19XXXA Unspecified fall, initial encounter: Secondary | ICD-10-CM | POA: Diagnosis not present

## 2021-07-22 DIAGNOSIS — E1122 Type 2 diabetes mellitus with diabetic chronic kidney disease: Secondary | ICD-10-CM | POA: Insufficient documentation

## 2021-07-22 DIAGNOSIS — R4189 Other symptoms and signs involving cognitive functions and awareness: Secondary | ICD-10-CM | POA: Diagnosis not present

## 2021-07-22 DIAGNOSIS — F0390 Unspecified dementia without behavioral disturbance: Secondary | ICD-10-CM | POA: Diagnosis not present

## 2021-07-22 DIAGNOSIS — G20C Parkinsonism, unspecified: Secondary | ICD-10-CM

## 2021-07-22 MED ORDER — POTASSIUM IODIDE (ANTIDOTE) 130 MG PO TABS: 130.0000 mg | ORAL_TABLET | Freq: Once | ORAL | Status: DC

## 2021-07-22 MED ORDER — IOFLUPANE I 123 185 MBQ/2.5ML IV SOLN
4.6000 | Freq: Once | INTRAVENOUS | Status: AC | PRN
Start: 2021-07-22 — End: 2021-07-22
  Administered 2021-07-22: 4.6 via INTRAVENOUS
  Filled 2021-07-22: qty 5

## 2021-07-22 MED ORDER — POTASSIUM IODIDE (ANTIDOTE) 130 MG PO TABS
ORAL_TABLET | ORAL | Status: AC
  Administered 2021-07-22: 130 mg via ORAL
  Filled 2021-07-22: qty 1

## 2021-07-29 NOTE — Telephone Encounter (Signed)
Intake: 11/02/2021 Testing: 11/02/2021 Feedback: 11/18/2021

## 2021-10-06 ENCOUNTER — Ambulatory Visit: Payer: Medicare HMO | Admitting: Podiatry

## 2021-10-12 ENCOUNTER — Ambulatory Visit (INDEPENDENT_AMBULATORY_CARE_PROVIDER_SITE_OTHER): Payer: Medicare HMO | Admitting: Podiatry

## 2021-10-12 ENCOUNTER — Encounter: Payer: Self-pay | Admitting: Podiatry

## 2021-10-12 DIAGNOSIS — M79675 Pain in left toe(s): Secondary | ICD-10-CM

## 2021-10-12 DIAGNOSIS — M79674 Pain in right toe(s): Secondary | ICD-10-CM | POA: Diagnosis not present

## 2021-10-12 DIAGNOSIS — D689 Coagulation defect, unspecified: Secondary | ICD-10-CM

## 2021-10-12 DIAGNOSIS — M21621 Bunionette of right foot: Secondary | ICD-10-CM

## 2021-10-12 DIAGNOSIS — B351 Tinea unguium: Secondary | ICD-10-CM | POA: Diagnosis not present

## 2021-10-12 NOTE — Progress Notes (Signed)
This patient returns to my office for at risk foot care.  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  Patient is taking pradaxa.This patient is unable to cut nails himself since the patient cannot reach his nails.These nails are painful walking and wearing shoes.  This patient presents for at risk foot care today.  General Appearance  Alert, conversant and in no acute stress.  Vascular  Dorsalis pedis  are weakly  palpable  bilaterally. Posterior tibial pulses are absent  Bilateral. Capillary return is within normal limits  Bilateral.  Cold feet.  Bilaterally. Absent hair noted.  Neurologic  Senn-Weinstein monofilament wire test within normal limits  bilaterally. Muscle power within normal limits bilaterally.  Nails Thick disfigured discolored nails with subungual debris  from hallux to fifth toes bilaterally. No evidence of bacterial infection or drainage bilaterally.  Orthopedic  No limitations of motion  feet .  No crepitus or effusions noted.  No bony pathology or digital deformities noted.  Hammer toes 2,3 right foot.  Skin  normotropic skin with no porokeratosis noted bilaterally.  No signs of infections or ulcers noted.  Asymptomatic diffuse callus right forefoot.   Onychomycosis  Pain in right toes  Pain in left toes  Consent was obtained for treatment procedures.   Mechanical debridement of nails 1-5  bilaterally performed with a nail nipper.  Filed with dremel without incident.    Return office visit   3 months.                  Told patient to return for periodic foot care and evaluation due to potential at risk complications.   Kashmir Leedy DPM  

## 2021-11-11 ENCOUNTER — Telehealth: Payer: Self-pay | Admitting: Neurology

## 2021-11-11 NOTE — Telephone Encounter (Signed)
Pt's significant other called wanting to either speak to the RN or Provider to ask what kind of driving test the pt should take. There is a in person option and a simulator option. Please advise.

## 2021-11-11 NOTE — Telephone Encounter (Signed)
LMVM for SO. About the [facility] in [place] [address] [place], [place] [address] [phone], or [phone] [email or web address]  This is what Dr. Jaynee Eagles uses for comprehensive evaluation Includes clinical and in vehicle behind the wheel testing by an occupational therapist.  Lovey Newcomer RN

## 2021-11-23 ENCOUNTER — Telehealth: Payer: Self-pay | Admitting: *Deleted

## 2021-11-23 NOTE — Telephone Encounter (Signed)
Received formal neuropsychological testing results from Dr Cecelia Byars. Feedback session w/ pt pending for 12/01/21. I also see note in chart that pt's s.o. inquired about driving testing. I called his s.o. Ms Charlestine Massed (on [place]) and LVM (ok per [facility]) asking for call back when she can so we can schedule a follow-up with Dr Jaynee Eagles. This will need to occur after all testing complete.   When she calls back, please see when pt will have driving testing done and schedule appt for at least 2 weeks after that. The feedback session with Dr Cecelia Byars is 12/01/21 and it needs to be after that as well.

## 2022-01-03 ENCOUNTER — Other Ambulatory Visit: Payer: Self-pay | Admitting: Cardiology

## 2022-01-03 NOTE — Telephone Encounter (Signed)
Prescription refill request for Pradaxa received.  Indication:afib Last office visit:needs appt Weight:79.5 kg Age:81 Scr:1.3 CrCl:48.41  Prescription refilled

## 2022-01-11 ENCOUNTER — Ambulatory Visit (INDEPENDENT_AMBULATORY_CARE_PROVIDER_SITE_OTHER): Payer: Medicare HMO | Admitting: Podiatry

## 2022-01-11 ENCOUNTER — Encounter: Payer: Self-pay | Admitting: Podiatry

## 2022-01-11 DIAGNOSIS — M79675 Pain in left toe(s): Secondary | ICD-10-CM | POA: Diagnosis not present

## 2022-01-11 DIAGNOSIS — M79674 Pain in right toe(s): Secondary | ICD-10-CM | POA: Diagnosis not present

## 2022-01-11 DIAGNOSIS — D689 Coagulation defect, unspecified: Secondary | ICD-10-CM

## 2022-01-11 DIAGNOSIS — B351 Tinea unguium: Secondary | ICD-10-CM

## 2022-01-11 NOTE — Progress Notes (Signed)
This patient returns to my office for at risk foot care.  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  Patient is taking pradaxa.This patient is unable to cut nails himself since the patient cannot reach his nails.These nails are painful walking and wearing shoes.  This patient presents for at risk foot care today.  General Appearance  Alert, conversant and in no acute stress.  Vascular  Dorsalis pedis  are weakly  palpable  bilaterally. Posterior tibial pulses are absent  Bilateral. Capillary return is within normal limits  Bilateral.  Cold feet.  Bilaterally. Absent hair noted.  Neurologic  Senn-Weinstein monofilament wire test within normal limits  bilaterally. Muscle power within normal limits bilaterally.  Nails Thick disfigured discolored nails with subungual debris  from hallux to fifth toes bilaterally. No evidence of bacterial infection or drainage bilaterally.  Orthopedic  No limitations of motion  feet .  No crepitus or effusions noted.  No bony pathology or digital deformities noted.  Hammer toes 2,3 right foot.  Skin  normotropic skin with no porokeratosis noted bilaterally.  No signs of infections or ulcers noted.  Asymptomatic diffuse callus right forefoot.   Onychomycosis  Pain in right toes  Pain in left toes  Consent was obtained for treatment procedures.   Mechanical debridement of nails 1-5  bilaterally performed with a nail nipper.  Filed with dremel without incident.    Return office visit   3 months.                  Told patient to return for periodic foot care and evaluation due to potential at risk complications.   Rheba Diamond DPM  

## 2022-02-17 ENCOUNTER — Emergency Department (HOSPITAL_COMMUNITY): Payer: Medicare HMO

## 2022-02-17 ENCOUNTER — Inpatient Hospital Stay (HOSPITAL_COMMUNITY)
Admission: EM | Admit: 2022-02-17 | Discharge: 2022-02-20 | DRG: 809 | Disposition: A | Discharge status: Home / Self Care | Payer: Medicare HMO | Attending: Internal Medicine | Admitting: Internal Medicine

## 2022-02-17 DIAGNOSIS — I4891 Unspecified atrial fibrillation: Secondary | ICD-10-CM | POA: Diagnosis present

## 2022-02-17 DIAGNOSIS — T451X5A Adverse effect of antineoplastic and immunosuppressive drugs, initial encounter: Secondary | ICD-10-CM | POA: Diagnosis present

## 2022-02-17 DIAGNOSIS — J449 Chronic obstructive pulmonary disease, unspecified: Secondary | ICD-10-CM | POA: Diagnosis present

## 2022-02-17 DIAGNOSIS — Z85828 Personal history of other malignant neoplasm of skin: Secondary | ICD-10-CM

## 2022-02-17 DIAGNOSIS — D701 Agranulocytosis secondary to cancer chemotherapy: Principal | ICD-10-CM | POA: Diagnosis present

## 2022-02-17 DIAGNOSIS — Z8051 Family history of malignant neoplasm of kidney: Secondary | ICD-10-CM

## 2022-02-17 DIAGNOSIS — E785 Hyperlipidemia, unspecified: Secondary | ICD-10-CM | POA: Diagnosis present

## 2022-02-17 DIAGNOSIS — E876 Hypokalemia: Secondary | ICD-10-CM | POA: Diagnosis present

## 2022-02-17 DIAGNOSIS — Z833 Family history of diabetes mellitus: Secondary | ICD-10-CM | POA: Diagnosis not present

## 2022-02-17 DIAGNOSIS — Z85528 Personal history of other malignant neoplasm of kidney: Secondary | ICD-10-CM

## 2022-02-17 DIAGNOSIS — Z79899 Other long term (current) drug therapy: Secondary | ICD-10-CM

## 2022-02-17 DIAGNOSIS — K219 Gastro-esophageal reflux disease without esophagitis: Secondary | ICD-10-CM | POA: Diagnosis present

## 2022-02-17 DIAGNOSIS — C642 Malignant neoplasm of left kidney, except renal pelvis: Secondary | ICD-10-CM | POA: Diagnosis present

## 2022-02-17 DIAGNOSIS — B338 Other specified viral diseases: Secondary | ICD-10-CM

## 2022-02-17 DIAGNOSIS — Y929 Unspecified place or not applicable: Secondary | ICD-10-CM | POA: Diagnosis not present

## 2022-02-17 DIAGNOSIS — F0390 Unspecified dementia without behavioral disturbance: Secondary | ICD-10-CM | POA: Diagnosis present

## 2022-02-17 DIAGNOSIS — D709 Neutropenia, unspecified: Principal | ICD-10-CM | POA: Diagnosis present

## 2022-02-17 DIAGNOSIS — Z825 Family history of asthma and other chronic lower respiratory diseases: Secondary | ICD-10-CM

## 2022-02-17 DIAGNOSIS — H9190 Unspecified hearing loss, unspecified ear: Secondary | ICD-10-CM | POA: Diagnosis present

## 2022-02-17 DIAGNOSIS — G4733 Obstructive sleep apnea (adult) (pediatric): Secondary | ICD-10-CM | POA: Diagnosis present

## 2022-02-17 DIAGNOSIS — B974 Respiratory syncytial virus as the cause of diseases classified elsewhere: Secondary | ICD-10-CM | POA: Diagnosis present

## 2022-02-17 DIAGNOSIS — C9000 Multiple myeloma not having achieved remission: Secondary | ICD-10-CM | POA: Diagnosis present

## 2022-02-17 DIAGNOSIS — N400 Enlarged prostate without lower urinary tract symptoms: Secondary | ICD-10-CM | POA: Diagnosis present

## 2022-02-17 DIAGNOSIS — R5081 Fever presenting with conditions classified elsewhere: Secondary | ICD-10-CM | POA: Diagnosis present

## 2022-02-17 DIAGNOSIS — L89151 Pressure ulcer of sacral region, stage 1: Secondary | ICD-10-CM | POA: Diagnosis present

## 2022-02-17 DIAGNOSIS — L0292 Furuncle, unspecified: Secondary | ICD-10-CM | POA: Diagnosis present

## 2022-02-17 DIAGNOSIS — Z888 Allergy status to other drugs, medicaments and biological substances status: Secondary | ICD-10-CM

## 2022-02-17 DIAGNOSIS — I714 Abdominal aortic aneurysm, without rupture, unspecified: Secondary | ICD-10-CM | POA: Diagnosis present

## 2022-02-17 DIAGNOSIS — Z1152 Encounter for screening for COVID-19: Secondary | ICD-10-CM

## 2022-02-17 DIAGNOSIS — Z8249 Family history of ischemic heart disease and other diseases of the circulatory system: Secondary | ICD-10-CM

## 2022-02-17 DIAGNOSIS — Z823 Family history of stroke: Secondary | ICD-10-CM

## 2022-02-17 DIAGNOSIS — L8990 Pressure ulcer of unspecified site, unspecified stage: Secondary | ICD-10-CM | POA: Insufficient documentation

## 2022-02-17 LAB — COMPREHENSIVE METABOLIC PANEL
ALT: 14 U/L (ref 0–44)
AST: 14 U/L — ABNORMAL LOW (ref 15–41)
Albumin: 3.6 g/dL (ref 3.5–5.0)
Alkaline Phosphatase: 52 U/L (ref 38–126)
Anion gap: 9 (ref 5–15)
BUN: 19 mg/dL (ref 8–23)
CO2: 23 mmol/L (ref 22–32)
Calcium: 8.5 mg/dL — ABNORMAL LOW (ref 8.9–10.3)
Chloride: 107 mmol/L (ref 98–111)
Creatinine, Ser: 1.12 mg/dL (ref 0.61–1.24)
GFR, Estimated: >60 mL/min (ref >60)
Glucose, Bld: 141 mg/dL — ABNORMAL HIGH (ref 70–99)
Potassium: 2.8 mmol/L — ABNORMAL LOW (ref 3.5–5.1)
Sodium: 139 mmol/L (ref 135–145)
Total Bilirubin: 1.3 mg/dL — ABNORMAL HIGH (ref 0.3–1.2)
Total Protein: 6 g/dL — ABNORMAL LOW (ref 6.5–8.1)

## 2022-02-17 LAB — URINALYSIS, ROUTINE W REFLEX MICROSCOPIC
Bacteria, UA: NONE SEEN
Bilirubin Urine: NEGATIVE
Glucose, UA: NEGATIVE mg/dL
Ketones, ur: NEGATIVE mg/dL
Leukocytes,Ua: NEGATIVE
Nitrite: NEGATIVE
Protein, ur: 30 mg/dL — AB
Specific Gravity, Urine: 1.023 (ref 1.005–1.030)
pH: 5 (ref 5.0–8.0)

## 2022-02-17 LAB — CBC WITH DIFFERENTIAL/PLATELET
Abs Immature Granulocytes: 0 10*3/uL (ref 0.00–0.07)
Basophils Absolute: 0 10*3/uL (ref 0.0–0.1)
Basophils Relative: 1 %
Eosinophils Absolute: 0 10*3/uL (ref 0.0–0.5)
Eosinophils Relative: 0 %
HCT: 30.9 % — ABNORMAL LOW (ref 39.0–52.0)
Hemoglobin: 10.3 g/dL — ABNORMAL LOW (ref 13.0–17.0)
Immature Granulocytes: 0 %
Lymphocytes Relative: 7 %
Lymphs Abs: 0.1 10*3/uL — ABNORMAL LOW (ref 0.7–4.0)
MCH: 31.6 pg (ref 26.0–34.0)
MCHC: 33.3 g/dL (ref 30.0–36.0)
MCV: 94.8 fL (ref 80.0–100.0)
Monocytes Absolute: 0.2 10*3/uL (ref 0.1–1.0)
Monocytes Relative: 15 %
Neutro Abs: 0.8 10*3/uL — ABNORMAL LOW (ref 1.7–7.7)
Neutrophils Relative %: 77 %
Platelets: 73 10*3/uL — ABNORMAL LOW (ref 150–400)
RBC: 3.26 MIL/uL — ABNORMAL LOW (ref 4.22–5.81)
RDW: 16 % — ABNORMAL HIGH (ref 11.5–15.5)
WBC: 1 10*3/uL — CL (ref 4.0–10.5)
nRBC: 0 % (ref 0.0–0.2)

## 2022-02-17 LAB — PROTIME-INR
INR: 1.3 — ABNORMAL HIGH (ref 0.8–1.2)
Prothrombin Time: 16.4 seconds — ABNORMAL HIGH (ref 11.4–15.2)

## 2022-02-17 LAB — RESP PANEL BY RT-PCR (RSV, FLU A&B, COVID)  RVPGX2
Influenza A by PCR: NEGATIVE
Influenza B by PCR: NEGATIVE
Resp Syncytial Virus by PCR: POSITIVE — AB
SARS Coronavirus 2 by RT PCR: NEGATIVE

## 2022-02-17 LAB — LACTIC ACID, PLASMA: Lactic Acid, Venous: 1.6 mmol/L (ref 0.5–1.9)

## 2022-02-17 MED ORDER — PIPERACILLIN-TAZOBACTAM 3.375 G IVPB 30 MIN
3.3750 g | Freq: Once | INTRAVENOUS | Status: AC
  Administered 2022-02-18: 3.375 g via INTRAVENOUS
  Filled 2022-02-17: qty 50

## 2022-02-17 MED ORDER — POTASSIUM CHLORIDE 10 MEQ/100ML IV SOLN
10.0000 meq | INTRAVENOUS | Status: AC
  Administered 2022-02-18 (×2): 10 meq via INTRAVENOUS
  Filled 2022-02-17 (×2): qty 100

## 2022-02-17 NOTE — H&P (Signed)
History and Physical    [name]. MVE:[id number] DOB: 11/30/1940 DOA: 02/17/2022  PCP: Derinda Late, MD  Patient coming from: home  I have personally briefly reviewed patient's old medical records in [place]  Chief Complaint: fever 101  at home, fatiuge  HPI: [name]. is a 81 y.o. male with medical history significant of  Multiple Myeloma not currently in remission followed by oncology at [facility], Afib , GERD/barret's esophagus , BPH, asthma , gerd, Hld ,pancreatic cyst consistent with IPMN ,renal  cell carcinoma in 2009 treated with RFA currently on surveillance OSA on CPAP who presents to ED s/p noting fever at home.  Patient notes no sob/chills/ n/v/d /chest pain / abdominal pain. He however does note a furuncle on the dorsal aspect of his upper arm. He states this area does not  cause him discomfort. He does not recall any injury to area or insect bite.  ED Course:  Tmx 101.4, bp 133/75, hr 17, sat 94%  Cxr: Possible trace pleural effusions.  Labs: Inr 1.3 N a 139, K 2.8, glu 141, cr 1.1 Lactic 1l6 Wbc 1, hgb 10.3, plt 73 (212) UA:neg  Respiratory panel +RSV Tx ivf, potassium  Review of Systems: As per HPI otherwise 10 point review of systems negative.   Past Medical History:  Diagnosis Date   Allergy    Arrhythmia    Atrial fibrillation ([place])    Barrett esophagus    Barrett's esophagus    Blood transfusion without reported diagnosis    BPH (benign prostatic hyperplasia)    Cervical stenosis of spine    Chronic obstructive asthma    Difficult intubation 13 or 14 years ago, at [facility] long   airway was up near nostrils with intubation per patient with bowel surgery   ED (erectile dysfunction)    GERD (gastroesophageal reflux disease)    Hammer toe of right foot    Hiatal hernia    Hyperlipidemia    Hypertension    OSA (obstructive sleep apnea) 08/05/10 [facility]   AHI 24   Paroxysmal atrial fibrillation ([place])    Hospitalized on 05/27/10    Renal cell carcinoma 2009   right renal mass, followed by Dr. Ander Slade   Sleep apnea     Past Surgical History:  Procedure Laterality Date   BASAL CELL CARCINOMA EXCISION     BIOPSY  12/18/2017   Procedure: BIOPSY;  Surgeon: [name], MD;  Location: [facility];  Service: Endoscopy;;   ESOPHAGOGASTRODUODENOSCOPY (EGD) WITH PROPOFOL N/A 08/26/2014   Procedure: ESOPHAGOGASTRODUODENOSCOPY (EGD) WITH PROPOFOL;  Surgeon: [name], MD;  Location: [facility];  Service: Endoscopy;  Laterality: N/A;   ESOPHAGOGASTRODUODENOSCOPY (EGD) WITH PROPOFOL N/A 12/18/2017   Procedure: ESOPHAGOGASTRODUODENOSCOPY (EGD) WITH PROPOFOL;  Surgeon: [name], MD;  Location: [facility];  Service: Endoscopy;  Laterality: N/A;   EXPLORATORY LAPAROTOMY W/ BOWEL RESECTION  12/1999   segmental sbr of a hemangioma, repair of incarcerated umbilical hernia   exploratory laparotomy, segmental small bowel resection of a hemangioma, repair of incarcerated umbelical hernia  28/3662   EYE SURGERY     cataracts done   renal cancer surgery with RF Ablation     TONSILLECTOMY       reports that he quit smoking about 41 years ago. His smoking use included cigars. He has never used smokeless tobacco. He reports that he does not drink alcohol and does not use drugs.  Allergies  Allergen Reactions   Tamsulosin  Other (See Comments)    dizziness   Proair Hfa [Albuterol] Palpitations    Only use in emergency due to has heart problems    Family History  Problem Relation Age of Onset   Stroke Mother    Diabetes Mother    Diabetes Father    Obesity Brother    Diabetes Brother    Kidney cancer Paternal Aunt    Asthma Paternal Grandmother    Heart attack Paternal Grandfather    Colon cancer Neg Hx    Esophageal cancer Neg Hx    Rectal cancer Neg Hx    Stomach cancer Neg Hx    Dementia Neg Hx    Alzheimer's disease Neg Hx     Prior to Admission medications   Medication Sig Start Date End Date  Taking? Authorizing Provider  acyclovir (ZOVIRAX) 400 MG tablet Take 1 tablet by mouth twice daily for VZV (Shingles) prophylaxis. 01/05/20   [provider]  albuterol (VENTOLIN HFA) 108 (90 Base) MCG/ACT inhaler Inhale into the lungs as needed. 01/05/20   [provider]  alfuzosin (UROXATRAL) 10 MG 24 hr tablet Take 1 tablet by mouth daily. 08/11/17   [provider]  AMBULATORY NON FORMULARY MEDICATION CPAP at night    [provider]  AMBULATORY NON FORMULARY MEDICATION [place]  Take 1 tablet twice a day    [provider]  calcium citrate-vitamin D (CITRACAL+D) 315-200 MG-UNIT tablet Take by mouth. 03/12/20   [provider]  diltiazem (CARDIZEM CD) 180 MG 24 hr capsule Take 1 capsule (180 mg total) by mouth daily. 10/05/20 09/30/21  Martinique, Peter M, MD  diltiazem (TIAZAC) 120 MG 24 hr capsule TAKE ONE CAPSULE IN THE MORNING 11/17/20   [provider]  donepezil (ARICEPT) 10 MG tablet Take 10 mg by mouth daily.  06/27/19   [provider]  dutasteride (AVODART) 0.5 MG capsule Take 0.5 mg by mouth daily.  02/26/15   [provider]  lenalidomide (REVLIMID) 10 MG capsule Take 1 capsule (10 mg) by mouth on days 1-21 of each 28 day cycle. Adult Male Auth # [id number] Date 02/27/20 03/03/20   [provider]  memantine (NAMENDA) 10 MG tablet  12/06/19   [provider]  memantine (NAMENDA) 10 MG tablet Take by mouth. 09/16/20   [provider]  metoprolol succinate (TOPROL-XL) 50 MG 24 hr tablet TAKE 1 TABLET BY MOUTH EVERY DAY WITH OR FOLLOWING A MEAL 04/29/20   Martinique, Peter M, MD  montelukast (SINGULAIR) 10 MG tablet TAKE 1 TABLET(10 MG) BY MOUTH AT BEDTIME 01/29/18   [name], MD  montelukast (SINGULAIR) 10 MG tablet Take by mouth.    [provider]  omeprazole (PRILOSEC) 20 MG capsule TAKE ONE CAPSULE BY MOUTH EVERY DAY 11/29/16   [name], MD  omeprazole (PRILOSEC) 20 MG capsule  Take 1 capsule by mouth daily. 10/16/13   [provider]  PRADAXA 150 MG CAPS capsule TAKE ONE CAPSULE BY MOUTH TWICE DAILY 01/03/22   Martinique, Peter M, MD  pravastatin (PRAVACHOL) 20 MG tablet Take 20 mg daily by mouth.    [provider]  sulfamethoxazole-trimethoprim (BACTRIM DS) 800-160 MG tablet Take by mouth. 01/06/21   [provider]  tadalafil (CIALIS) 5 MG tablet Take 5 mg by mouth as needed.  04/04/18   [provider]  tadalafil (CIALIS) 5 MG tablet Take 1 tablet by mouth daily. 09/16/20   [provider]    Physical Exam: Vitals:  02/17/22 2230 02/17/22 2245 02/17/22 2300 02/17/22 2315  BP: 108/63 118/73 119/65 (!) 116/57  Pulse: 83 90 90 78  Resp: _0 Temp:      TempSrc:      SpO2: 94% 94% 94% 95%    Constitutional: NAD, calm, comfortable Vitals:   02/17/22 2230 02/17/22 2245 02/17/22 2300 02/17/22 2315  BP: 108/63 118/73 119/65 (!) 116/57  Pulse: 83 90 90 78  Resp: _1 Temp:      TempSrc:      SpO2: 94% 94% 94% 95%   Eyes: PERRL, lids and conjunctivae normal ENMT: Mucous membranes are moist. Posterior pharynx clear of any exudate or lesions.Normal dentition.  Neck: normal, supple, no masses, no thyromegaly Respiratory: clear to auscultation bilaterally, no wheezing, no crackles. Normal respiratory effort. No accessory muscle use.  Cardiovascular: Regular rate and rhythm, no murmurs / rubs / gallops. No extremity edema. 2+ pedal pulses. No carotid bruits.  Abdomen: no tenderness, no masses palpated. No hepatosplenomegaly. Bowel sounds positive.  Musculoskeletal: no clubbing / cyanosis. No joint deformity upper and lower extremities. Good ROM, no contractures. Normal muscle tone.  Skin: dorsal aspect of left upper area noted furuncle. No other lesions, ulcers.  Neurologic: CN 2-12 grossly intact. Sensation intact,. Strength 5/5 in all 4.  Psychiatric: Normal judgment and insight. Alert and oriented x 2.  Normal mood.    Labs on Admission: I have personally reviewed following labs and imaging studies  CBC: Recent Labs  Lab 02/17/22 2130  WBC 1.0*  NEUTROABS 0.8*  HGB 10.3*  HCT 30.9*  MCV 94.8  PLT 73*   Basic Metabolic Panel: Recent Labs  Lab 02/17/22 2130  NA 139  K 2.8*  CL 107  CO2 23  GLUCOSE 141*  BUN 19  CREATININE 1.12  CALCIUM 8.5*   GFR: CrCl cannot be calculated (Unknown ideal weight.). Liver Function Tests: Recent Labs  Lab 02/17/22 2130  AST 14*  ALT 14  ALKPHOS 52  BILITOT 1.3*  PROT 6.0*  ALBUMIN 3.6   No results for input(s): "LIPASE", "AMYLASE" in the last 168 hours. No results for input(s): "AMMONIA" in the last 168 hours. Coagulation Profile: Recent Labs  Lab 02/17/22 2130  INR 1.3*   Cardiac Enzymes: No results for input(s): "CKTOTAL", "CKMB", "CKMBINDEX", "TROPONINI" in the last 168 hours. BNP (last 3 results) No results for input(s): "PROBNP" in the last 8760 hours. HbA1C: No results for input(s): "HGBA1C" in the last 72 hours. CBG: No results for input(s): "GLUCAP" in the last 168 hours. Lipid Profile: No results for input(s): "CHOL", "HDL", "LDLCALC", "TRIG", "CHOLHDL", "LDLDIRECT" in the last 72 hours. Thyroid Function Tests: No results for input(s): "TSH", "T4TOTAL", "FREET4", "T3FREE", "THYROIDAB" in the last 72 hours. Anemia Panel: No results for input(s): "VITAMINB12", "FOLATE", "FERRITIN", "TIBC", "IRON", "RETICCTPCT" in the last 72 hours. Urine analysis:    Component Value Date/Time   BILIRUBINUR neg 11/19/2012 0831   PROTEINUR neg 11/19/2012 0831   UROBILINOGEN 0.2 11/19/2012 0831   NITRITE neg 11/19/2012 0831   LEUKOCYTESUR Negative 11/19/2012 0831    Radiological Exams on Admission: DG Chest 2 View  Result Date: 02/17/2022 CLINICAL DATA:  Suspected Sepsis EXAM: CHEST - 2 VIEW COMPARISON:  Chest x-ray 08/19/2020 FINDINGS: The heart and mediastinal contours are unchanged. Aortic calcification. No focal  consolidation. No pulmonary edema. Chronic blunting of the left costophrenic angle with likely underlying scarring. Possible trace pleural effusions. No pneumothorax. No acute osseous abnormality. IMPRESSION: Possible trace  pleural effusions. Electronically Signed   By: Iven Finn M.D.   On: 02/17/2022 21:33    EKG: Independently reviewed.   Assessment/Plan  Neutropenic fever  -in setting of MM undergoing treatment at [facility] -on tx with daratumumab, lenalidomide, dexamethasone  -in setting of + RSV w/o respiratory symptoms - ANC770- moderate neutropenia  -continue with zosyn per heme/onc as bacterial infection cannot be definitively ruled out  -f/u on furher heme/onc recs  - supportive care gently ivfs  -place on neutropenic precautions    OSA -cpap qhs   COPD -no acute exacerbation  AAA -no active issues   Renal Cell Carcinoma  (1/7/230) -Renal  cell carcinoma in 2009 treated with RFA currently on surveillance  -followed by urology and heme/onc  Atrial fibrillation  -continue diltiazem /metoprolol/pradaxa -no active issues currently   Dementia  -continue Aricept   GERD Ppi  DVT prophylaxis: on pradaxa Code Status: FULL Family Communication: Hurley,Karen (Significant other) [phone] (Home Phone)  Disposition Plan: patient  expected to be admitted greater than 2 midnights  Consults called: heme/onc Admission status: inpatient   Clance Boll MD [facility]   If 7PM-7AM, please contact night-coverage [email or web address] Password [facility]  02/17/2022, 11:34 PM

## 2022-02-17 NOTE — ED Triage Notes (Signed)
Fever starting yesterday, returned today. Tmax 101.5 at home. Lives at home with wife. Hx of dementia but wife states that he is more confused than normal. Has been weak at home. Wound to left posterior upper arm with drainage, redness, and warmth the the area. Had '650mg'$  PO of tylenol en route.

## 2022-02-17 NOTE — Progress Notes (Signed)
A consult was received from an ED physician for Zosyn per pharmacy dosing.  The patient's profile has been reviewed for ht/wt/allergies/indication/available labs.    A one time order has been placed for Zosyn 3.375gm IV.    Further antibiotics/pharmacy consults should be ordered by admitting physician if indicated.                       Thank you, Everette Rank, PharmD 02/17/2022  11:18 PM

## 2022-02-17 NOTE — ED Provider Notes (Signed)
[facility] DEPT Provider Note   CSN: [id number] Arrival date & time: 02/17/22  2058     History  Chief Complaint  Patient presents with   Fever    [name]. is a 81 y.o. male.   Fever Max temp prior to arrival:  101.5 Temp source:  Oral Severity:  Moderate Onset quality:  Unable to specify Timing:  Unable to specify Progression:  Unable to specify Chronicity:  New Associated symptoms: chills, confusion (mild per family) and dysuria (intermittnet per pt)   Associated symptoms: no chest pain, no congestion, no cough, no diarrhea, no headaches, no nausea, no rash, no rhinorrhea, no sore throat and no vomiting        Home Medications Prior to Admission medications   Medication Sig Start Date End Date Taking? Authorizing Provider  acyclovir (ZOVIRAX) 400 MG tablet Take 1 tablet by mouth twice daily for VZV (Shingles) prophylaxis. 01/05/20   [provider]  albuterol (VENTOLIN HFA) 108 (90 Base) MCG/ACT inhaler Inhale into the lungs as needed. 01/05/20   [provider]  alfuzosin (UROXATRAL) 10 MG 24 hr tablet Take 1 tablet by mouth daily. 08/11/17   [provider]  AMBULATORY NON FORMULARY MEDICATION CPAP at night    [provider]  AMBULATORY NON FORMULARY MEDICATION [place]  Take 1 tablet twice a day    [provider]  calcium citrate-vitamin D (CITRACAL+D) 315-200 MG-UNIT tablet Take by mouth. 03/12/20   [provider]  diltiazem (CARDIZEM CD) 180 MG 24 hr capsule Take 1 capsule (180 mg total) by mouth daily. 10/05/20 09/30/21  Martinique, Peter M, MD  diltiazem (TIAZAC) 120 MG 24 hr capsule TAKE ONE CAPSULE IN THE MORNING 11/17/20   [provider]  donepezil (ARICEPT) 10 MG tablet Take 10 mg by mouth daily.  06/27/19   [provider]  dutasteride (AVODART) 0.5 MG capsule Take 0.5 mg by mouth daily.  02/26/15   [provider]  lenalidomide (REVLIMID) 10 MG  capsule Take 1 capsule (10 mg) by mouth on days 1-21 of each 28 day cycle. Adult Male Auth # [id number] Date 02/27/20 03/03/20   [provider]  memantine (NAMENDA) 10 MG tablet  12/06/19   [provider]  memantine (NAMENDA) 10 MG tablet Take by mouth. 09/16/20   [provider]  metoprolol succinate (TOPROL-XL) 50 MG 24 hr tablet TAKE 1 TABLET BY MOUTH EVERY DAY WITH OR FOLLOWING A MEAL 04/29/20   Martinique, Peter M, MD  montelukast (SINGULAIR) 10 MG tablet TAKE 1 TABLET(10 MG) BY MOUTH AT BEDTIME 01/29/18   [name], MD  montelukast (SINGULAIR) 10 MG tablet Take by mouth.    [provider]  omeprazole (PRILOSEC) 20 MG capsule TAKE ONE CAPSULE BY MOUTH EVERY DAY 11/29/16   [name], MD  omeprazole (PRILOSEC) 20 MG capsule Take 1 capsule by mouth daily. 10/16/13   [provider]  PRADAXA 150 MG CAPS capsule TAKE ONE CAPSULE BY MOUTH TWICE DAILY 01/03/22   Martinique, Peter M, MD  pravastatin (PRAVACHOL) 20 MG tablet Take 20 mg daily by mouth.    [provider]  sulfamethoxazole-trimethoprim (BACTRIM DS) 800-160 MG tablet Take by mouth. 01/06/21   [provider]  tadalafil (CIALIS) 5 MG tablet Take 5 mg by mouth as needed.  04/04/18   [provider]  tadalafil (CIALIS) 5 MG tablet Take 1 tablet by mouth daily. 09/16/20   [provider]  Allergies    Tamsulosin and Proair hfa [albuterol]    Review of Systems   Review of Systems  Constitutional:  Positive for chills, fatigue and fever. Negative for diaphoresis.  HENT:  Negative for congestion, rhinorrhea and sore throat.   Eyes:  Negative for visual disturbance.  Respiratory:  Negative for cough, chest tightness, shortness of breath and wheezing.   Cardiovascular:  Negative for chest pain, palpitations and leg swelling.  Gastrointestinal:  Negative for abdominal pain, constipation, diarrhea, nausea and vomiting.  Genitourinary:  Positive for dysuria (intermittnet  per pt). Negative for flank pain and frequency.  Musculoskeletal:  Negative for back pain and neck pain.  Skin:  Positive for color change (redness on back of L upper arm). Negative for rash.  Neurological:  Negative for seizures, weakness, light-headedness and headaches.  Psychiatric/Behavioral:  Positive for confusion (mild per family). Negative for agitation.   All other systems reviewed and are negative.   Physical Exam Updated Vital Signs BP 133/75   Pulse 91   Temp (!) 101.4 F (38.6 C) (Oral)   Resp 17   SpO2 94%  Physical Exam Vitals and nursing note reviewed.  Constitutional:      General: He is not in acute distress.    Appearance: He is well-developed. He is not ill-appearing, toxic-appearing or diaphoretic.  HENT:     Head: Normocephalic and atraumatic.     Nose: No congestion or rhinorrhea.     Mouth/Throat:     Mouth: Mucous membranes are moist.     Pharynx: No oropharyngeal exudate or posterior oropharyngeal erythema.  Eyes:     Extraocular Movements: Extraocular movements intact.     Conjunctiva/sclera: Conjunctivae normal.     Pupils: Pupils are equal, round, and reactive to light.  Cardiovascular:     Rate and Rhythm: Normal rate and regular rhythm.     Heart sounds: No murmur heard. Pulmonary:     Effort: Pulmonary effort is normal. No respiratory distress.     Breath sounds: Rhonchi present. No wheezing or rales.  Chest:     Chest wall: No tenderness.  Abdominal:     General: Abdomen is flat.     Palpations: Abdomen is soft.     Tenderness: There is no abdominal tenderness. There is no right CVA tenderness, left CVA tenderness, guarding or rebound.  Musculoskeletal:        General: No swelling or tenderness.     Cervical back: Neck supple. No tenderness.     Right lower leg: No edema.     Left lower leg: No edema.  Skin:    General: Skin is warm and dry.     Capillary Refill: Capillary refill takes less than 2 seconds.     Coloration: Skin is not  pale.     Findings: Erythema present. No rash.       Neurological:     General: No focal deficit present.     Mental Status: He is alert.     Sensory: No sensory deficit.     Motor: No weakness.  Psychiatric:        Mood and Affect: Mood normal.     ED Results / Procedures / Treatments   Labs (all labs ordered are listed, but only abnormal results are displayed) Labs Reviewed  RESP PANEL BY RT-PCR (RSV, FLU A&B, COVID)  RVPGX2 - Abnormal; Notable for the following components:      Result Value   Resp Syncytial Virus by PCR POSITIVE (*)  All other components within normal limits  COMPREHENSIVE METABOLIC PANEL - Abnormal; Notable for the following components:   Potassium 2.8 (*)    Glucose, Bld 141 (*)    Calcium 8.5 (*)    Total Protein 6.0 (*)    AST 14 (*)    Total Bilirubin 1.3 (*)    All other components within normal limits  CBC WITH DIFFERENTIAL/PLATELET - Abnormal; Notable for the following components:   WBC 1.0 (*)    RBC 3.26 (*)    Hemoglobin 10.3 (*)    HCT 30.9 (*)    RDW 16.0 (*)    Platelets 73 (*)    Neutro Abs 0.8 (*)    Lymphs Abs 0.1 (*)    All other components within normal limits  PROTIME-INR - Abnormal; Notable for the following components:   Prothrombin Time 16.4 (*)    INR 1.3 (*)    All other components within normal limits  CULTURE, BLOOD (ROUTINE X 2)  CULTURE, BLOOD (ROUTINE X 2)  URINE CULTURE  LACTIC ACID, PLASMA  LACTIC ACID, PLASMA  URINALYSIS, ROUTINE W REFLEX MICROSCOPIC    EKG None  Radiology DG Chest 2 View  Result Date: 02/17/2022 CLINICAL DATA:  Suspected Sepsis EXAM: CHEST - 2 VIEW COMPARISON:  Chest x-ray 08/19/2020 FINDINGS: The heart and mediastinal contours are unchanged. Aortic calcification. No focal consolidation. No pulmonary edema. Chronic blunting of the left costophrenic angle with likely underlying scarring. Possible trace pleural effusions. No pneumothorax. No acute osseous abnormality. IMPRESSION: Possible  trace pleural effusions. Electronically Signed   By: Iven Finn M.D.   On: 02/17/2022 21:33    Procedures Procedures     CRITICAL CARE Performed by: Gwenyth Allegra Zailyn Thoennes Total critical care time: 35 minutes Critical care time was exclusive of separately billable procedures and treating other patients. Critical care was necessary to treat or prevent imminent or life-threatening deterioration. Critical care was time spent personally by me on the following activities: development of treatment plan with patient and/or surrogate as well as nursing, discussions with consultants, evaluation of patient's response to treatment, examination of patient, obtaining history from patient or surrogate, ordering and performing treatments and interventions, ordering and review of laboratory studies, ordering and review of radiographic studies, pulse oximetry and re-evaluation of patient's condition.   Medications Ordered in ED Medications  potassium chloride 10 mEq in 100 mL IVPB (has no administration in time range)  piperacillin-tazobactam (ZOSYN) IVPB 3.375 g (has no administration in time range)    ED Course/ Medical Decision Making/ A&P                           Medical Decision Making Amount and/or Complexity of Data Reviewed Labs: ordered. Radiology: ordered.  Risk Prescription drug management. Decision regarding hospitalization.    Lewayne Pauley. is a 81 y.o. male with a past medical history significant for atrial fibrillation on Pradaxa, hypertension, hyperlipidemia, previous renal cell carcinoma, Barrett's esophagus, GERD, hiatal hernia, AAA, chronic obstructive asthma, and anxiety who presents for fever, left posterior arm redness, fatigue, and intermittent dysuria.  According to patient, he has had fever up to 101.5 at home and is one 1.4 on arrival here.  Patient reports she is feeling fine but does agree to feeling slightly fatigued.  He also does state he had remittent  dysuria but is not having it right now.  He denies any congestion, cough, or URI symptoms.  Specific denies any headache,  neck pain, or neck stiffness.  Denies abdominal pain, nausea, vomiting, constipation, or diarrhea.  Denies pain in his arms or legs but triage note says that he has had a red area on his posterior upper left arm.  Otherwise patient denies complaints.  On exam, lungs did have some faint rhonchi but no wheezing.  Chest abdomen nontender.  Pupils are symmetric and reactive with normal extract movements.  No nuchal rigidity and normal neck range of motion.  No focal neurologic deficits initially.  Patient has a 1 cm area of redness and erythema on his left posterior upper arm.  There was no fluctuance or crepitance but there was some surrounding erythema and an area where there likely was some drainage recently.  Intact sensation, strength, and pulses in extremities.  Patient resting comfortably.  Due to the patient's fever, we will get a workup to look for etiology.  Patient was given Tylenol and route.  We will get chest x-ray, urinalysis, and some labs.  There could be cellulitis there is very mild in his left upper arm and there was no palpable abscess.  No evidence of neck Fash.  No head ache or neck pain or neck stiffness have suspicion for meningitis.  Given the admit dysuria will check urinalysis.  Anticipate reassessment after workup to determine disposition.  Workup continues to return and patient does have what appears to be pancytopenia.  Chart review shows that he does have history of multiple myeloma and is currently getting therapy for it.   I called and spoke with Dr. Geryl Rankins with [facility] health hematology/oncology who recommended treatment with IV Zosyn and admission to the medicine service.  They will have the team see in consultation.  Patient did show a positive result for RSV however given the neutropenia they still recommended antibiotics.  Patient be admitted for  suspected RSV causing fever for antibiotic management in the setting of neutropenia.         Final Clinical Impression(s) / ED Diagnoses Final diagnoses:  Neutropenic fever ([place])  RSV infection  Hypokalemia     Clinical Impression: 1. Neutropenic fever ([place])   2. RSV infection   3. Hypokalemia     Disposition: Admit  This note was prepared with assistance of Dragon voice recognition software. Occasional wrong-word or sound-a-like substitutions may have occurred due to the inherent limitations of voice recognition software.      Roxy Mastandrea, Gwenyth Allegra, MD 02/17/22 [phone]

## 2022-02-18 ENCOUNTER — Encounter (HOSPITAL_COMMUNITY): Payer: Self-pay | Admitting: Internal Medicine

## 2022-02-18 ENCOUNTER — Other Ambulatory Visit: Payer: Self-pay

## 2022-02-18 DIAGNOSIS — L8990 Pressure ulcer of unspecified site, unspecified stage: Secondary | ICD-10-CM | POA: Insufficient documentation

## 2022-02-18 DIAGNOSIS — F0390 Unspecified dementia without behavioral disturbance: Secondary | ICD-10-CM | POA: Insufficient documentation

## 2022-02-18 DIAGNOSIS — D709 Neutropenia, unspecified: Secondary | ICD-10-CM | POA: Diagnosis not present

## 2022-02-18 DIAGNOSIS — R5081 Fever presenting with conditions classified elsewhere: Secondary | ICD-10-CM | POA: Diagnosis not present

## 2022-02-18 LAB — COMPREHENSIVE METABOLIC PANEL
ALT: 14 U/L (ref 0–44)
AST: 13 U/L — ABNORMAL LOW (ref 15–41)
Albumin: 3.3 g/dL — ABNORMAL LOW (ref 3.5–5.0)
Alkaline Phosphatase: 47 U/L (ref 38–126)
Anion gap: 6 (ref 5–15)
BUN: 14 mg/dL (ref 8–23)
CO2: 27 mmol/L (ref 22–32)
Calcium: 8.3 mg/dL — ABNORMAL LOW (ref 8.9–10.3)
Chloride: 106 mmol/L (ref 98–111)
Creatinine, Ser: 1.13 mg/dL (ref 0.61–1.24)
GFR, Estimated: >60 mL/min (ref >60)
Glucose, Bld: 102 mg/dL — ABNORMAL HIGH (ref 70–99)
Potassium: 3.2 mmol/L — ABNORMAL LOW (ref 3.5–5.1)
Sodium: 139 mmol/L (ref 135–145)
Total Bilirubin: 1.3 mg/dL — ABNORMAL HIGH (ref 0.3–1.2)
Total Protein: 5.8 g/dL — ABNORMAL LOW (ref 6.5–8.1)

## 2022-02-18 LAB — CBC
HCT: 32.6 % — ABNORMAL LOW (ref 39.0–52.0)
Hemoglobin: 10.6 g/dL — ABNORMAL LOW (ref 13.0–17.0)
MCH: 31.1 pg (ref 26.0–34.0)
MCHC: 32.5 g/dL (ref 30.0–36.0)
MCV: 95.6 fL (ref 80.0–100.0)
Platelets: 72 10*3/uL — ABNORMAL LOW (ref 150–400)
RBC: 3.41 MIL/uL — ABNORMAL LOW (ref 4.22–5.81)
RDW: 16.1 % — ABNORMAL HIGH (ref 11.5–15.5)
WBC: 1 10*3/uL — CL (ref 4.0–10.5)
nRBC: 0 % (ref 0.0–0.2)

## 2022-02-18 LAB — CBG MONITORING, ED: Glucose-Capillary: 92 mg/dL (ref 70–99)

## 2022-02-18 LAB — MAGNESIUM: Magnesium: 2.2 mg/dL (ref 1.7–2.4)

## 2022-02-18 MED ORDER — DILTIAZEM HCL ER COATED BEADS 120 MG PO CP24
120.0000 mg | ORAL_CAPSULE | Freq: Every day | ORAL | Status: DC
  Administered 2022-02-18 – 2022-02-20 (×3): 120 mg via ORAL
  Filled 2022-02-18 (×3): qty 1

## 2022-02-18 MED ORDER — ONDANSETRON HCL 4 MG PO TABS: 4.0000 mg | ORAL_TABLET | Freq: Four times a day (QID) | ORAL | Status: DC | PRN

## 2022-02-18 MED ORDER — SODIUM CHLORIDE 0.9 % IV SOLN: INTRAVENOUS | Status: DC

## 2022-02-18 MED ORDER — OYSTER SHELL CALCIUM/D3 500-5 MG-MCG PO TABS
1.0000 | ORAL_TABLET | Freq: Two times a day (BID) | ORAL | Status: DC
  Administered 2022-02-18 – 2022-02-20 (×5): 1 via ORAL
  Filled 2022-02-18 (×6): qty 1

## 2022-02-18 MED ORDER — MEMANTINE HCL 10 MG PO TABS
10.0000 mg | ORAL_TABLET | Freq: Two times a day (BID) | ORAL | Status: DC
  Administered 2022-02-18 – 2022-02-20 (×5): 10 mg via ORAL
  Filled 2022-02-18 (×5): qty 1

## 2022-02-18 MED ORDER — DONEPEZIL HCL 10 MG PO TABS
10.0000 mg | ORAL_TABLET | Freq: Every day | ORAL | Status: DC
  Administered 2022-02-18 – 2022-02-20 (×3): 10 mg via ORAL
  Filled 2022-02-18 (×3): qty 1

## 2022-02-18 MED ORDER — ACETAMINOPHEN 650 MG RE SUPP: 650.0000 mg | Freq: Four times a day (QID) | RECTAL | Status: DC | PRN

## 2022-02-18 MED ORDER — ACYCLOVIR 400 MG PO TABS
400.0000 mg | ORAL_TABLET | Freq: Two times a day (BID) | ORAL | Status: DC
  Administered 2022-02-18 – 2022-02-20 (×5): 400 mg via ORAL
  Filled 2022-02-18 (×6): qty 1

## 2022-02-18 MED ORDER — PANTOPRAZOLE SODIUM 40 MG PO TBEC
40.0000 mg | DELAYED_RELEASE_TABLET | Freq: Every day | ORAL | Status: DC
  Administered 2022-02-18 – 2022-02-20 (×3): 40 mg via ORAL
  Filled 2022-02-18 (×3): qty 1

## 2022-02-18 MED ORDER — PRAVASTATIN SODIUM 20 MG PO TABS
20.0000 mg | ORAL_TABLET | Freq: Every day | ORAL | Status: DC
  Administered 2022-02-18 – 2022-02-20 (×3): 20 mg via ORAL
  Filled 2022-02-18 (×3): qty 1

## 2022-02-18 MED ORDER — DILTIAZEM HCL ER COATED BEADS 180 MG PO CP24: 180.0000 mg | ORAL_CAPSULE | Freq: Every day | ORAL | Status: DC

## 2022-02-18 MED ORDER — POTASSIUM CHLORIDE CRYS ER 20 MEQ PO TBCR
40.0000 meq | EXTENDED_RELEASE_TABLET | ORAL | Status: AC
  Administered 2022-02-18 (×2): 40 meq via ORAL
  Filled 2022-02-18 (×2): qty 2

## 2022-02-18 MED ORDER — PIPERACILLIN-TAZOBACTAM 3.375 G IVPB
3.3750 g | Freq: Three times a day (TID) | INTRAVENOUS | Status: DC
  Administered 2022-02-18 – 2022-02-20 (×7): 3.375 g via INTRAVENOUS
  Filled 2022-02-18 (×6): qty 50

## 2022-02-18 MED ORDER — DUTASTERIDE 0.5 MG PO CAPS
0.5000 mg | ORAL_CAPSULE | Freq: Every evening | ORAL | Status: DC
  Administered 2022-02-18 – 2022-02-19 (×2): 0.5 mg via ORAL
  Filled 2022-02-18 (×4): qty 1

## 2022-02-18 MED ORDER — IPRATROPIUM BROMIDE 0.02 % IN SOLN: 0.5000 mg | RESPIRATORY_TRACT | Status: DC | PRN

## 2022-02-18 MED ORDER — DABIGATRAN ETEXILATE MESYLATE 150 MG PO CAPS
150.0000 mg | ORAL_CAPSULE | Freq: Two times a day (BID) | ORAL | Status: DC
  Filled 2022-02-18: qty 1

## 2022-02-18 MED ORDER — MONTELUKAST SODIUM 10 MG PO TABS
10.0000 mg | ORAL_TABLET | Freq: Every morning | ORAL | Status: DC
  Administered 2022-02-18 – 2022-02-20 (×3): 10 mg via ORAL
  Filled 2022-02-18 (×3): qty 1

## 2022-02-18 MED ORDER — CALCIUM CITRATE-VITAMIN D 315-200 MG-UNIT PO TABS: 1.0000 | ORAL_TABLET | Freq: Two times a day (BID) | ORAL | Status: DC

## 2022-02-18 MED ORDER — ACETAMINOPHEN 325 MG PO TABS
650.0000 mg | ORAL_TABLET | Freq: Four times a day (QID) | ORAL | Status: DC | PRN
  Administered 2022-02-19: 650 mg via ORAL
  Filled 2022-02-18: qty 2

## 2022-02-18 MED ORDER — ALFUZOSIN HCL ER 10 MG PO TB24
10.0000 mg | ORAL_TABLET | Freq: Every evening | ORAL | Status: DC
  Administered 2022-02-18 – 2022-02-19 (×2): 10 mg via ORAL
  Filled 2022-02-18 (×4): qty 1

## 2022-02-18 MED ORDER — TADALAFIL 5 MG PO TABS
5.0000 mg | ORAL_TABLET | Freq: Every day | ORAL | Status: DC
  Administered 2022-02-18 – 2022-02-20 (×3): 5 mg via ORAL
  Filled 2022-02-18 (×3): qty 1

## 2022-02-18 MED ORDER — PIPERACILLIN-TAZOBACTAM 3.375 G IVPB 30 MIN: 3.3750 g | Freq: Four times a day (QID) | INTRAVENOUS | Status: DC

## 2022-02-18 MED ORDER — POTASSIUM CHLORIDE CRYS ER 10 MEQ PO TBCR
10.0000 meq | EXTENDED_RELEASE_TABLET | Freq: Every day | ORAL | Status: DC
  Administered 2022-02-18: 10 meq via ORAL
  Filled 2022-02-18: qty 1

## 2022-02-18 MED ORDER — TADALAFIL 5 MG PO TABS
5.0000 mg | ORAL_TABLET | Freq: Every day | ORAL | Status: DC
  Filled 2022-02-18: qty 1

## 2022-02-18 MED ORDER — ONDANSETRON HCL 4 MG/2ML IJ SOLN: 4.0000 mg | Freq: Four times a day (QID) | INTRAMUSCULAR | Status: DC | PRN

## 2022-02-18 MED ORDER — METOPROLOL SUCCINATE ER 25 MG PO TB24
25.0000 mg | ORAL_TABLET | Freq: Every morning | ORAL | Status: DC
  Administered 2022-02-18 – 2022-02-20 (×3): 25 mg via ORAL
  Filled 2022-02-18 (×3): qty 1

## 2022-02-18 MED ORDER — DABIGATRAN ETEXILATE MESYLATE 150 MG PO CAPS
150.0000 mg | ORAL_CAPSULE | Freq: Two times a day (BID) | ORAL | Status: DC
  Administered 2022-02-18 – 2022-02-20 (×5): 150 mg via ORAL
  Filled 2022-02-18 (×5): qty 1

## 2022-02-18 NOTE — Progress Notes (Signed)
Patient conveys that he does not tolerate a mask with his CPAP. At home he uses an oral device that he "bites on". For now, he wishes to hold nocturnal CPAP since a mask is all the hospital offers at this time.

## 2022-02-18 NOTE — Progress Notes (Signed)
[name].   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  He was found to be neutropenic, and a positive RSV, he was admitted for further management.  Patient is hard of hearing, and I spoke with his wife [name] the phone.  He does tell me he is feeling better today.  Objective:  Vitals:   02/18/22 1020 02/18/22 1417  BP: (!) 146/66 (!) 143/78  Pulse: 67 85  Resp: 18 20  Temp: 98.6 F (37 C) (!) 97.5 F (36.4 C)  SpO2: 99% 99%    Body mass index is 23.4 kg/m.  Intake/Output Summary (Last 24 hours) at 02/18/2022 1626 Last data filed at 02/18/2022 0452 Gross per 24 hour  Intake 300 ml  Output --  Net 300 ml     Sclerae unicteric  Oropharynx clear  No peripheral adenopathy  Lungs clear -- no rales or rhonchi  Heart regular rate and rhythm  Abdomen benign  MSK no focal spinal tenderness, no peripheral edema  Neuro nonfocal    CBG (last 3)  Recent Labs    02/18/22 0809  GLUCAP 92     Labs:  Urine Studies No results for input(s): "UHGB", "CRYS" in the last 72 hours.  Invalid input(s): "UACOL", "UAPR", "USPG", "UPH", "UTP", "UGL", "UKET", "UBIL", "UNIT", "UROB", "ULEU", "UEPI", "UWBC", "URBC", "UBAC", "CAST", "UCOM", "BILUA"  Basic Metabolic Panel: Recent Labs  Lab 02/17/22 2130 02/18/22 1042  NA 139 139  [name] 2.8* 3.2*  CL 107 106  CO2 23 27  GLUCOSE 141* 102*  BUN 19 14  CREATININE 1.12 1.13  CALCIUM 8.5* 8.3*  MG  --  2.2   GFR Estimated Creatinine Clearance: 52.8 mL/min (by C-G formula based on SCr of 1.13 mg/dL). Liver Function Tests: Recent Labs  Lab 02/17/22 2130 02/18/22 1042  AST 14* 13*  ALT 14 14  ALKPHOS 52 47  BILITOT 1.3* 1.3*  PROT 6.0* 5.8*  ALBUMIN 3.6 3.3*   No results for input(s): "LIPASE", "AMYLASE" in the last  168 hours. No results for input(s): "AMMONIA" in the last 168 hours. Coagulation profile Recent Labs  Lab 02/17/22 2130  INR 1.3*    CBC: Recent Labs  Lab 02/17/22 2130 02/18/22 1042  WBC 1.0* 1.0*  NEUTROABS 0.8*  --   HGB 10.3* 10.6*  HCT 30.9* 32.6*  MCV 94.8 95.6  PLT 73* 72*   Cardiac Enzymes: No results for input(s): "CKTOTAL", "CKMB", "CKMBINDEX", "TROPONINI" in the last 168 hours. BNP: Invalid input(s): "POCBNP" CBG: Recent Labs  Lab 02/18/22 0809  GLUCAP 92   D-Dimer No results for input(s): "DDIMER" in the last 72 hours. Hgb A1c No results for input(s): "HGBA1C" in the last 72 hours. Lipid Profile No results for input(s): "CHOL", "HDL", "LDLCALC", "TRIG", "CHOLHDL", "LDLDIRECT" in the last 72 hours. Thyroid function studies No results for input(s): "TSH", "T4TOTAL", "T3FREE", "THYROIDAB" in the last 72 hours.  Invalid input(s): "FREET3" Anemia work up No results for input(s): "VITAMINB12", "FOLATE", "FERRITIN", "TIBC", "IRON", "RETICCTPCT" in the last 72 hours. Microbiology Recent Results (from the past 240 hour(s))  Culture, blood (Routine x 2)     Status: None (Preliminary result)   Collection Time: 02/17/22  9:30 PM   Specimen: BLOOD  Result Value Ref Range Status   Specimen Description  Final    BLOOD BLOOD LEFT ARM Performed at [facility] [address] [place], [place] [address]    Special Requests   Final    BOTTLES DRAWN AEROBIC AND ANAEROBIC Blood Culture adequate volume Performed at [facility] [address] [place], [place] [address]    Culture   Final    NO GROWTH < 12 HOURS Performed at [facility] [address], [place], [place] [address]    Report Status PENDING  Incomplete  Culture, blood (Routine x 2)     Status: None (Preliminary result)   Collection Time: 02/17/22  9:44 PM   Specimen: BLOOD  Result Value Ref Range Status   Specimen Description   Final    BLOOD BLOOD  RIGHT WRIST Performed at [facility] [address], [place], [place] [address]    Special Requests   Final    BOTTLES DRAWN AEROBIC AND ANAEROBIC Blood Culture adequate volume Performed at [facility] [address], [place], [place] [address]    Culture   Final    NO GROWTH < 12 HOURS Performed at [facility] [address], [place], [place] [address]    Report Status PENDING  Incomplete  Resp panel by RT-PCR (RSV, Flu A&B, Covid) Anterior Nasal Swab     Status: Abnormal   Collection Time: 02/17/22  9:50 PM   Specimen: Anterior Nasal Swab  Result Value Ref Range Status   SARS Coronavirus 2 by RT PCR NEGATIVE NEGATIVE Final    Comment: (NOTE) SARS-CoV-2 target nucleic acids are NOT DETECTED.  The SARS-CoV-2 RNA is generally detectable in upper respiratory specimens during the acute phase of infection. The lowest concentration of SARS-CoV-2 viral copies this assay can detect is 138 copies/mL. A negative result does not preclude SARS-Cov-2 infection and should not be used as the sole basis for treatment or other patient management decisions. A negative result may occur with  improper specimen collection/handling, submission of specimen other than nasopharyngeal swab, presence of viral mutation(s) within the areas targeted by this assay, and inadequate number of viral copies(<138 copies/mL). A negative result must be combined with clinical observations, patient history, and epidemiological information. The expected result is Negative.  Fact Sheet for Patients:  EntrepreneurPulse.com.au  Fact Sheet for Healthcare Providers:  IncredibleEmployment.be  This test is no t yet approved or cleared by the [place] FDA and  has been authorized for detection and/or diagnosis of SARS-CoV-2 by FDA under an Emergency Use Authorization (EUA). This EUA will remain  in effect (meaning this test can be  used) for the duration of the COVID-19 declaration under Section 564(b)(1) of the Act, 21 U.S.C.section 360bbb-3(b)(1), unless the authorization is terminated  or revoked sooner.       Influenza A by PCR NEGATIVE NEGATIVE Final   Influenza B by PCR NEGATIVE NEGATIVE Final    Comment: (NOTE) The Xpert Xpress SARS-CoV-2/FLU/RSV plus assay is intended as an aid in the diagnosis of influenza from Nasopharyngeal swab specimens and should not be used as a sole basis for treatment. Nasal washings and aspirates are unacceptable for Xpert Xpress SARS-CoV-2/FLU/RSV testing.  Fact Sheet for Patients: EntrepreneurPulse.com.au  Fact Sheet for Healthcare Providers: IncredibleEmployment.be  This test is not yet approved or cleared by the [place] FDA and has been authorized for detection and/or diagnosis of SARS-CoV-2 by FDA under an Emergency Use Authorization (EUA). This EUA will remain in effect (meaning this test  can be used) for the duration of the COVID-19 declaration under Section 564(b)(1) of the Act, 21 U.S.C. section 360bbb-3(b)(1), unless the authorization is terminated or revoked.     Resp Syncytial Virus by PCR POSITIVE (A) NEGATIVE Final    Comment: (NOTE) Fact Sheet for Patients: EntrepreneurPulse.com.au  Fact Sheet for Healthcare Providers: IncredibleEmployment.be  This test is not yet approved or cleared by the [place] FDA and has been authorized for detection and/or diagnosis of SARS-CoV-2 by FDA under an Emergency Use Authorization (EUA). This EUA will remain in effect (meaning this test can be used) for the duration of the COVID-19 declaration under Section 564(b)(1) of the Act, 21 U.S.C. section 360bbb-3(b)(1), unless the authorization is terminated or revoked.  Performed at [facility], [place] [address], [place], [place] [address]       Studies:  DG Chest 2  View  Result Date: 02/17/2022 CLINICAL DATA:  Suspected Sepsis EXAM: CHEST - 2 VIEW COMPARISON:  Chest x-ray 08/19/2020 FINDINGS: The heart and mediastinal contours are unchanged. Aortic calcification. No focal consolidation. No pulmonary edema. Chronic blunting of the left costophrenic angle with likely underlying scarring. Possible trace pleural effusions. No pneumothorax. No acute osseous abnormality. IMPRESSION: Possible trace pleural effusions. Electronically Signed   By: Iven Finn M.D.   On: 02/17/2022 21:33    Assessment: 81 y.o. 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  Just to finish the current cycle of Revlimid, will be off for a week.  His counts will gradually recover. -If he remains febrile and ANC does not recover in the next few days, okay to give Granix 480 mcg daily  -His blood culture pending -Agree with broad antibiotics and supportive care.  If his blood culture negative, okay to discharge in 2-3 days with oral antibiotics  -I will f/u as needed -I spoke with his hematologist Dr. Quentin Ore at the [facility], MD 02/18/2022  4:26 PM

## 2022-02-18 NOTE — Progress Notes (Addendum)
PROGRESS NOTE    [name].  RJJ:[id number] DOB: 19-Dec-1940 DOA: 02/17/2022 PCP: Derinda Late, MD     Brief Narrative:  [name]. is an 81 y.o. male with medical history significant of multiple myeloma not currently in remission followed by oncology at [facility], Afib, GERD/barret's esophagus, BPH, asthma, gerd, hld, pancreatic cyst consistent with IPMN, renal  cell carcinoma in 2009 treated with RFA currently on surveillance, OSA on CPAP who presents to ED after noting fever at home.    New events last 24 hours / Subjective: Patient seen in the emergency department, awaiting transfer upstairs.  He denies fevers at home, but did have a fever in the ED 101.4.  He is positive for RSV.  He denies any respiratory discomfort, congestion or cough, although he does have a cough during my examination.  Assessment & Plan:   Principal Problem:   Neutropenic fever ([place]) Active Problems:   Atrial fibrillation (HCC)   GERD   OSA on CPAP   Multiple myeloma (HCC)   Renal cell carcinoma of left kidney (HCC)   Pressure injury of skin   Dementia without behavioral disturbance (HCC)  Neutropenic fever -Insetting of multiple myeloma -Insetting of RSV positive -Blood cultures pending -EDP discussed with heme-onc who recommended to start Zosyn for empiric bacterial coverage -Hematology consulted, discussed with Dr. Burr Medico   A-fib -Diltiazem, metoprolol, Pradaxa  Dementia -Aricept, Namenda   Renal cell carcinoma -Treated with RFA currently on surveillance  OSA -CPAP nightly  Hypokalemia -Replace, trend   In agreement with assessment of the pressure ulcer as below:  Pressure Injury 02/18/22 Coccyx Mid Stage 1 -  Intact skin with non-blanchable redness of a localized area usually over a bony prominence. redness (Active)  02/18/22 1029  Location: Coccyx  Location Orientation: Mid  Staging: Stage 1 -  Intact skin with non-blanchable redness of a localized area usually  over a bony prominence.  Wound Description (Comments): redness  Present on Admission: Yes  Dressing Type None 02/18/22 1020         DVT prophylaxis:  dabigatran (PRADAXA) capsule 150 mg  Code Status: Full Family Communication: None at bedside, states he lives with his gf of 40 years  Disposition Plan:  Status is: Inpatient Remains inpatient appropriate because: IV antibiotics   Antimicrobials:  Anti-infectives (From admission, onward)    Start     Dose/Rate Route Frequency Ordered Stop   02/18/22 1000  acyclovir (ZOVIRAX) tablet 400 mg        400 mg Oral 2 times daily 02/18/22 0632     02/18/22 0800  piperacillin-tazobactam (ZOSYN) IVPB 3.375 g        3.375 g 12.5 mL/hr over 240 Minutes Intravenous Every 8 hours 02/18/22 0607     02/18/22 0615  piperacillin-tazobactam (ZOSYN) IVPB 3.375 g  Status:  Discontinued        3.375 g 100 mL/hr over 30 Minutes Intravenous Every 6 hours 02/18/22 0601 02/18/22 0606   02/17/22 2330  piperacillin-tazobactam (ZOSYN) IVPB 3.375 g        3.375 g 100 mL/hr over 30 Minutes Intravenous  Once 02/17/22 2318 02/18/22 0134        Objective: Vitals:   02/18/22 0645 02/18/22 0930 02/18/22 1020 02/18/22 1022  BP: 125/60 127/72 (!) 146/66   Pulse: (!) 49 72 67   Resp: _0 Temp:   98.6 F (37 C)   TempSrc:   Oral   SpO2: 95% 95%  99%   Weight:    76.1 kg  Height:    _0  (1.803 m)    Intake/Output Summary (Last 24 hours) at 02/18/2022 1352 Last data filed at 02/18/2022 0452 Gross per 24 hour  Intake 300 ml  Output --  Net 300 ml   Filed Weights   02/18/22 1022  Weight: 76.1 kg    Examination:  General exam: Appears calm and comfortable  Respiratory system: Clear to auscultation. Respiratory effort normal. No respiratory distress. No conversational dyspnea.  Cardiovascular system: S1 & S2 heard, irreg rhythm. No murmurs. No pedal edema. Gastrointestinal system: Abdomen is nondistended, soft and nontender. Normal bowel  sounds heard. Central nervous system: Alert and oriented. No focal neurological deficits. Speech clear.  Extremities: Symmetric in appearance  Skin: No rashes, lesions or ulcers on exposed skin  Psychiatry: Judgement and insight appear normal. Mood & affect appropriate.   Data Reviewed: I have personally reviewed following labs and imaging studies  CBC: Recent Labs  Lab 02/17/22 2130 02/18/22 1042  WBC 1.0* 1.0*  NEUTROABS 0.8*  --   HGB 10.3* 10.6*  HCT 30.9* 32.6*  MCV 94.8 95.6  PLT 73* 72*   Basic Metabolic Panel: Recent Labs  Lab 02/17/22 2130 02/18/22 1042  NA 139 139  K 2.8* 3.2*  CL 107 106  CO2 23 27  GLUCOSE 141* 102*  BUN 19 14  CREATININE 1.12 1.13  CALCIUM 8.5* 8.3*  MG  --  2.2   GFR: Estimated Creatinine Clearance: 52.8 mL/min (by C-G formula based on SCr of 1.13 mg/dL). Liver Function Tests: Recent Labs  Lab 02/17/22 2130 02/18/22 1042  AST 14* 13*  ALT 14 14  ALKPHOS 52 47  BILITOT 1.3* 1.3*  PROT 6.0* 5.8*  ALBUMIN 3.6 3.3*   No results for input(s): "LIPASE", "AMYLASE" in the last 168 hours. No results for input(s): "AMMONIA" in the last 168 hours. Coagulation Profile: Recent Labs  Lab 02/17/22 2130  INR 1.3*   Cardiac Enzymes: No results for input(s): "CKTOTAL", "CKMB", "CKMBINDEX", "TROPONINI" in the last 168 hours. BNP (last 3 results) No results for input(s): "PROBNP" in the last 8760 hours. HbA1C: No results for input(s): "HGBA1C" in the last 72 hours. CBG: Recent Labs  Lab 02/18/22 0809  GLUCAP 92   Lipid Profile: No results for input(s): "CHOL", "HDL", "LDLCALC", "TRIG", "CHOLHDL", "LDLDIRECT" in the last 72 hours. Thyroid Function Tests: No results for input(s): "TSH", "T4TOTAL", "FREET4", "T3FREE", "THYROIDAB" in the last 72 hours. Anemia Panel: No results for input(s): "VITAMINB12", "FOLATE", "FERRITIN", "TIBC", "IRON", "RETICCTPCT" in the last 72 hours. Sepsis Labs: Recent Labs  Lab 02/17/22 2130   LATICACIDVEN 1.6    Recent Results (from the past 240 hour(s))  Culture, blood (Routine x 2)     Status: None (Preliminary result)   Collection Time: 02/17/22  9:30 PM   Specimen: BLOOD  Result Value Ref Range Status   Specimen Description   Final    BLOOD BLOOD LEFT ARM Performed at [facility], [place] [employer] [address] [place], [place] [address]    Special Requests   Final    BOTTLES DRAWN AEROBIC AND ANAEROBIC Blood Culture adequate volume Performed at [facility] [address], [place], [place] [address]    Culture   Final    NO GROWTH < 12 HOURS Performed at [facility] [address], [place], [place] [address]    Report Status PENDING  Incomplete  Culture, blood (Routine x 2)  Status: None (Preliminary result)   Collection Time: 02/17/22  9:44 PM   Specimen: BLOOD  Result Value Ref Range Status   Specimen Description   Final    BLOOD BLOOD RIGHT WRIST Performed at [facility] [address] [place], [place] [address]    Special Requests   Final    BOTTLES DRAWN AEROBIC AND ANAEROBIC Blood Culture adequate volume Performed at [facility] [address] [place], [place] [address]    Culture   Final    NO GROWTH < 12 HOURS Performed at [facility] [address], [place], [place] [address]    Report Status PENDING  Incomplete  Resp panel by RT-PCR (RSV, Flu A&B, Covid) Anterior Nasal Swab     Status: Abnormal   Collection Time: 02/17/22  9:50 PM   Specimen: Anterior Nasal Swab  Result Value Ref Range Status   SARS Coronavirus 2 by RT PCR NEGATIVE NEGATIVE Final    Comment: (NOTE) SARS-CoV-2 target nucleic acids are NOT DETECTED.  The SARS-CoV-2 RNA is generally detectable in upper respiratory specimens during the acute phase of infection. The lowest concentration of SARS-CoV-2 viral copies this assay can detect is 138 copies/mL. A negative result does not preclude  SARS-Cov-2 infection and should not be used as the sole basis for treatment or other patient management decisions. A negative result may occur with  improper specimen collection/handling, submission of specimen other than nasopharyngeal swab, presence of viral mutation(s) within the areas targeted by this assay, and inadequate number of viral copies(<138 copies/mL). A negative result must be combined with clinical observations, patient history, and epidemiological information. The expected result is Negative.  Fact Sheet for Patients:  EntrepreneurPulse.com.au  Fact Sheet for Healthcare Providers:  IncredibleEmployment.be  This test is no t yet approved or cleared by the [place] FDA and  has been authorized for detection and/or diagnosis of SARS-CoV-2 by FDA under an Emergency Use Authorization (EUA). This EUA will remain  in effect (meaning this test can be used) for the duration of the COVID-19 declaration under Section 564(b)(1) of the Act, 21 U.S.C.section 360bbb-3(b)(1), unless the authorization is terminated  or revoked sooner.       Influenza A by PCR NEGATIVE NEGATIVE Final   Influenza B by PCR NEGATIVE NEGATIVE Final    Comment: (NOTE) The Xpert Xpress SARS-CoV-2/FLU/RSV plus assay is intended as an aid in the diagnosis of influenza from Nasopharyngeal swab specimens and should not be used as a sole basis for treatment. Nasal washings and aspirates are unacceptable for Xpert Xpress SARS-CoV-2/FLU/RSV testing.  Fact Sheet for Patients: EntrepreneurPulse.com.au  Fact Sheet for Healthcare Providers: IncredibleEmployment.be  This test is not yet approved or cleared by the [place] FDA and has been authorized for detection and/or diagnosis of SARS-CoV-2 by FDA under an Emergency Use Authorization (EUA). This EUA will remain in effect (meaning this test can be used) for the duration of  the COVID-19 declaration under Section 564(b)(1) of the Act, 21 U.S.C. section 360bbb-3(b)(1), unless the authorization is terminated or revoked.     Resp Syncytial Virus by PCR POSITIVE (A) NEGATIVE Final    Comment: (NOTE) Fact Sheet for Patients: EntrepreneurPulse.com.au  Fact Sheet for Healthcare Providers: IncredibleEmployment.be  This test is not yet approved or cleared by the [place] FDA and has been authorized for detection and/or diagnosis of SARS-CoV-2 by FDA under an Emergency Use Authorization (EUA). This EUA will remain in effect (meaning this test can be  used) for the duration of the COVID-19 declaration under Section 564(b)(1) of the Act, 21 U.S.C. section 360bbb-3(b)(1), unless the authorization is terminated or revoked.  Performed at [facility], [place] [address], [place], [place] [address]       Radiology Studies: DG Chest 2 View  Result Date: 02/17/2022 CLINICAL DATA:  Suspected Sepsis EXAM: CHEST - 2 VIEW COMPARISON:  Chest x-ray 08/19/2020 FINDINGS: The heart and mediastinal contours are unchanged. Aortic calcification. No focal consolidation. No pulmonary edema. Chronic blunting of the left costophrenic angle with likely underlying scarring. Possible trace pleural effusions. No pneumothorax. No acute osseous abnormality. IMPRESSION: Possible trace pleural effusions. Electronically Signed   By: Iven Finn M.D.   On: 02/17/2022 21:33      Scheduled Meds:  acyclovir  400 mg Oral BID   alfuzosin  10 mg Oral QPM   calcium-vitamin D  1 tablet Oral BID WC   dabigatran  150 mg Oral BID   diltiazem  120 mg Oral Daily   donepezil  10 mg Oral Daily   dutasteride  0.5 mg Oral QPM   memantine  10 mg Oral BID   metoprolol succinate  25 mg Oral q morning   montelukast  10 mg Oral q AM   pantoprazole  40 mg Oral Daily   potassium chloride  40 mEq Oral Q4H   pravastatin  20 mg Oral Daily   tadalafil   5 mg Oral Daily   Continuous Infusions:  sodium chloride 75 mL/hr at 02/18/22 0836   piperacillin-tazobactam (ZOSYN)  IV 3.375 g (02/18/22 0833)     LOS: 1 day   Time spent: 30 minutes   Dessa Phi, DO [facility] 02/18/2022, 1:52 PM   Available via Epic secure chat 7am-7pm After these hours, please refer to coverage provider listed on [email or web address]

## 2022-02-18 NOTE — ED Notes (Signed)
Patient assisted to stand and use urinal.  Patient steady on his feet with standby assistance

## 2022-02-19 DIAGNOSIS — D709 Neutropenia, unspecified: Secondary | ICD-10-CM | POA: Diagnosis not present

## 2022-02-19 DIAGNOSIS — R5081 Fever presenting with conditions classified elsewhere: Secondary | ICD-10-CM | POA: Diagnosis not present

## 2022-02-19 LAB — URINE CULTURE: Culture: <10000 — AB

## 2022-02-19 LAB — GLUCOSE, CAPILLARY: Glucose-Capillary: 92 mg/dL (ref 70–99)

## 2022-02-19 LAB — BASIC METABOLIC PANEL
Anion gap: 7 (ref 5–15)
BUN: 12 mg/dL (ref 8–23)
CO2: 23 mmol/L (ref 22–32)
Calcium: 7.9 mg/dL — ABNORMAL LOW (ref 8.9–10.3)
Chloride: 112 mmol/L — ABNORMAL HIGH (ref 98–111)
Creatinine, Ser: 1.23 mg/dL (ref 0.61–1.24)
GFR, Estimated: 58 mL/min — ABNORMAL LOW (ref >60)
Glucose, Bld: 95 mg/dL (ref 70–99)
Potassium: 3.6 mmol/L (ref 3.5–5.1)
Sodium: 142 mmol/L (ref 135–145)

## 2022-02-19 LAB — CBC WITH DIFFERENTIAL/PLATELET
Abs Immature Granulocytes: 0 10*3/uL (ref 0.00–0.07)
Basophils Absolute: 0 10*3/uL (ref 0.0–0.1)
Basophils Relative: 1 %
Eosinophils Absolute: 0 10*3/uL (ref 0.0–0.5)
Eosinophils Relative: 1 %
HCT: 31.8 % — ABNORMAL LOW (ref 39.0–52.0)
Hemoglobin: 10.4 g/dL — ABNORMAL LOW (ref 13.0–17.0)
Immature Granulocytes: 0 %
Lymphocytes Relative: 10 %
Lymphs Abs: 0.1 10*3/uL — ABNORMAL LOW (ref 0.7–4.0)
MCH: 31.5 pg (ref 26.0–34.0)
MCHC: 32.7 g/dL (ref 30.0–36.0)
MCV: 96.4 fL (ref 80.0–100.0)
Monocytes Absolute: 0.2 10*3/uL (ref 0.1–1.0)
Monocytes Relative: 17 %
Neutro Abs: 0.7 10*3/uL — ABNORMAL LOW (ref 1.7–7.7)
Neutrophils Relative %: 71 %
Platelets: 75 10*3/uL — ABNORMAL LOW (ref 150–400)
RBC: 3.3 MIL/uL — ABNORMAL LOW (ref 4.22–5.81)
RDW: 15.9 % — ABNORMAL HIGH (ref 11.5–15.5)
WBC: 1 10*3/uL — CL (ref 4.0–10.5)
nRBC: 0 % (ref 0.0–0.2)

## 2022-02-19 LAB — MAGNESIUM: Magnesium: 2 mg/dL (ref 1.7–2.4)

## 2022-02-19 NOTE — Progress Notes (Addendum)
PROGRESS NOTE    [name].  KDP:[id number] DOB: Jul 28, 1940 DOA: 02/17/2022 PCP: Derinda Late, MD     Brief Narrative:  Deacon Gadbois. is an 81 y.o. male with medical history significant of multiple myeloma not currently in remission followed by oncology at [facility], Afib, GERD/barret's esophagus, BPH, asthma, gerd, hld, pancreatic cyst consistent with IPMN, renal  cell carcinoma in 2009 treated with RFA currently on surveillance, OSA on CPAP who presents to ED after noting fever at home.    New events last 24 hours / Subjective: Patient seen walking around the room independently. He voices no new complaints, other than his baseline chronic cough. No n/v/d. Afebrile last 24 hours   Assessment & Plan:   Principal Problem:   Neutropenic fever (HCC) Active Problems:   Atrial fibrillation (HCC)   GERD   OSA on CPAP   Multiple myeloma (HCC)   Renal cell carcinoma of left kidney (HCC)   Pressure injury of skin   Dementia without behavioral disturbance (HCC)  Neutropenic fever -Insetting of multiple myeloma treatment  -Insetting of RSV positive -Blood cultures NGTD  -Urine culture negative  -Appreciate heme/onc consult. Per Dr. Burr Medico, if he remains febrile and ANC does not recover in the next few days, okay to give Granix 480 mcg daily   -Continue broad spectrum antibiotics   A-fib -Diltiazem, metoprolol, Pradaxa  Dementia -Aricept, Namenda   Renal cell carcinoma -Treated with RFA currently on surveillance  OSA -CPAP nightly   In agreement with assessment of the pressure ulcer as below:  Pressure Injury 02/18/22 Coccyx Mid Stage 1 -  Intact skin with non-blanchable redness of a localized area usually over a bony prominence. redness (Active)  02/18/22 1029  Location: Coccyx  Location Orientation: Mid  Staging: Stage 1 -  Intact skin with non-blanchable redness of a localized area usually over a bony prominence.  Wound Description (Comments): redness   Present on Admission: Yes  Dressing Type None 02/19/22 0721         DVT prophylaxis:  dabigatran (PRADAXA) capsule 150 mg  Code Status: Full Family Communication: None at bedside, states he lives with his gf of 40 years. Called [facility] Glad but went straight to voicemail. Called and spoke with daughter by phone  Disposition Plan:  Status is: Inpatient Remains inpatient appropriate because: IV antibiotics   Antimicrobials:  Anti-infectives (From admission, onward)    Start     Dose/Rate Route Frequency Ordered Stop   02/18/22 1000  acyclovir (ZOVIRAX) tablet 400 mg        400 mg Oral 2 times daily 02/18/22 0632     02/18/22 0800  piperacillin-tazobactam (ZOSYN) IVPB 3.375 g        3.375 g 12.5 mL/hr over 240 Minutes Intravenous Every 8 hours 02/18/22 0607     02/18/22 0615  piperacillin-tazobactam (ZOSYN) IVPB 3.375 g  Status:  Discontinued        3.375 g 100 mL/hr over 30 Minutes Intravenous Every 6 hours 02/18/22 0601 02/18/22 0606   02/17/22 2330  piperacillin-tazobactam (ZOSYN) IVPB 3.375 g        3.375 g 100 mL/hr over 30 Minutes Intravenous  Once 02/17/22 2318 02/18/22 0134        Objective: Vitals:   02/18/22 1841 02/18/22 2223 02/19/22 0454 02/19/22 0500  BP: (!) 144/82 (!) 143/81 (!) 143/76   Pulse: 96 87 86   Resp: (!) _0 Temp: 98.6 F (37 C) 97.7 F (36.5  C) 98.5 F (36.9 C)   TempSrc: Oral Oral Oral   SpO2: 98% 96% 96%   Weight:    76.4 kg  Height:        Intake/Output Summary (Last 24 hours) at 02/19/2022 1008 Last data filed at 02/19/2022 2984 Gross per 24 hour  Intake 1382.72 ml  Output 700 ml  Net 682.72 ml    Filed Weights   02/18/22 1022 02/19/22 0500  Weight: 76.1 kg 76.4 kg    Examination:  General exam: Appears calm and comfortable  Respiratory system: Clear to auscultation. Respiratory effort normal. No respiratory distress. No conversational dyspnea. On room air  Cardiovascular system: S1 & S2 heard. No murmurs. No pedal  edema. Gastrointestinal system: Abdomen is nondistended, soft and nontender. Normal bowel sounds heard. Central nervous system: Alert and oriented. No focal neurological deficits. Speech clear.  Extremities: Symmetric in appearance  Skin: No rashes, lesions or ulcers on exposed skin  Psychiatry: Judgement and insight appear normal. Mood & affect appropriate.   Data Reviewed: I have personally reviewed following labs and imaging studies  CBC: Recent Labs  Lab 02/17/22 2130 02/18/22 1042 02/19/22 0655  WBC 1.0* 1.0* 1.0*  NEUTROABS 0.8*  --  0.7*  HGB 10.3* 10.6* 10.4*  HCT 30.9* 32.6* 31.8*  MCV 94.8 95.6 96.4  PLT 73* 72* 75*    Basic Metabolic Panel: Recent Labs  Lab 02/17/22 2130 02/18/22 1042 02/19/22 0655  NA 139 139 142  K 2.8* 3.2* 3.6  CL 107 106 112*  CO2 _0 GLUCOSE 141* 102* 95  BUN _1 CREATININE 1.12 1.13 1.23  CALCIUM 8.5* 8.3* 7.9*  MG  --  2.2 2.0    GFR: Estimated Creatinine Clearance: 48.5 mL/min (by C-G formula based on SCr of 1.23 mg/dL). Liver Function Tests: Recent Labs  Lab 02/17/22 2130 02/18/22 1042  AST 14* 13*  ALT 14 14  ALKPHOS 52 47  BILITOT 1.3* 1.3*  PROT 6.0* 5.8*  ALBUMIN 3.6 3.3*    No results for input(s): "LIPASE", "AMYLASE" in the last 168 hours. No results for input(s): "AMMONIA" in the last 168 hours. Coagulation Profile: Recent Labs  Lab 02/17/22 2130  INR 1.3*    Cardiac Enzymes: No results for input(s): "CKTOTAL", "CKMB", "CKMBINDEX", "TROPONINI" in the last 168 hours. BNP (last 3 results) No results for input(s): "PROBNP" in the last 8760 hours. HbA1C: No results for input(s): "HGBA1C" in the last 72 hours. CBG: Recent Labs  Lab 02/18/22 0809 02/19/22 0746  GLUCAP 92 92    Lipid Profile: No results for input(s): "CHOL", "HDL", "LDLCALC", "TRIG", "CHOLHDL", "LDLDIRECT" in the last 72 hours. Thyroid Function Tests: No results for input(s): "TSH", "T4TOTAL", "FREET4", "T3FREE",  "THYROIDAB" in the last 72 hours. Anemia Panel: No results for input(s): "VITAMINB12", "FOLATE", "FERRITIN", "TIBC", "IRON", "RETICCTPCT" in the last 72 hours. Sepsis Labs: Recent Labs  Lab 02/17/22 2130  LATICACIDVEN 1.6     Recent Results (from the past 240 hour(s))  Culture, blood (Routine x 2)     Status: None (Preliminary result)   Collection Time: 02/17/22  9:30 PM   Specimen: BLOOD  Result Value Ref Range Status   Specimen Description   Final    BLOOD BLOOD LEFT ARM Performed at [facility], [place] [address] [place], [place] [address]    Special Requests   Final    BOTTLES DRAWN AEROBIC AND ANAEROBIC Blood Culture adequate volume Performed at [facility], [address] [place], [place] [address]    Culture   Final    NO GROWTH 1 DAY Performed at [facility] [address], [place], [place] [address]    Report Status PENDING  Incomplete  Culture, blood (Routine x 2)     Status: None (Preliminary result)   Collection Time: 02/17/22  9:44 PM   Specimen: BLOOD  Result Value Ref Range Status   Specimen Description   Final    BLOOD BLOOD RIGHT WRIST Performed at [facility] [address], [place], [place] [address]    Special Requests   Final    BOTTLES DRAWN AEROBIC AND ANAEROBIC Blood Culture adequate volume Performed at [facility] [address], [place], [place] [address]    Culture   Final    NO GROWTH 1 DAY Performed at [facility], [place] [address], [place],  [address]    Report Status PENDING  Incomplete  Resp panel by RT-PCR (RSV, Flu A&B, Covid) Anterior Nasal Swab     Status: Abnormal   Collection Time: 02/17/22  9:50 PM   Specimen: Anterior Nasal Swab  Result Value Ref Range Status   SARS Coronavirus 2 by RT PCR NEGATIVE NEGATIVE Final    Comment: (NOTE) SARS-CoV-2 target nucleic acids are NOT DETECTED.  The SARS-CoV-2 RNA is generally detectable  in upper respiratory specimens during the acute phase of infection. The lowest concentration of SARS-CoV-2 viral copies this assay can detect is 138 copies/mL. A negative result does not preclude SARS-Cov-2 infection and should not be used as the sole basis for treatment or other patient management decisions. A negative result may occur with  improper specimen collection/handling, submission of specimen other than nasopharyngeal swab, presence of viral mutation(s) within the areas targeted by this assay, and inadequate number of viral copies(<138 copies/mL). A negative result must be combined with clinical observations, patient history, and epidemiological information. The expected result is Negative.  Fact Sheet for Patients:  EntrepreneurPulse.com.au  Fact Sheet for Healthcare Providers:  IncredibleEmployment.be  This test is no t yet approved or cleared by the [place] FDA and  has been authorized for detection and/or diagnosis of SARS-CoV-2 by FDA under an Emergency Use Authorization (EUA). This EUA will remain  in effect (meaning this test can be used) for the duration of the COVID-19 declaration under Section 564(b)(1) of the Act, 21 U.S.C.section 360bbb-3(b)(1), unless the authorization is terminated  or revoked sooner.       Influenza A by PCR NEGATIVE NEGATIVE Final   Influenza B by PCR NEGATIVE NEGATIVE Final    Comment: (NOTE) The Xpert Xpress SARS-CoV-2/FLU/RSV plus assay is intended as an aid in the diagnosis of influenza from Nasopharyngeal swab specimens and should not be used as a sole basis for treatment. Nasal washings and aspirates are unacceptable for Xpert Xpress SARS-CoV-2/FLU/RSV testing.  Fact Sheet for Patients: EntrepreneurPulse.com.au  Fact Sheet for Healthcare Providers: IncredibleEmployment.be  This test is not yet approved or cleared by the [place] FDA and has  been authorized for detection and/or diagnosis of SARS-CoV-2 by FDA under an Emergency Use Authorization (EUA). This EUA will remain in effect (meaning this test can be used) for the duration of the COVID-19 declaration under Section 564(b)(1) of the Act, 21 U.S.C. section 360bbb-3(b)(1), unless the authorization is terminated or revoked.     Resp Syncytial Virus by PCR POSITIVE (A) NEGATIVE Final    Comment: (NOTE) Fact Sheet for Patients: EntrepreneurPulse.com.au  Fact Sheet for Healthcare Providers: IncredibleEmployment.be  This test is not yet approved or cleared by the [place] FDA and has been authorized for detection and/or diagnosis of SARS-CoV-2 by FDA under an Emergency Use Authorization (EUA). This EUA will remain in effect (meaning this test can be used) for the duration of the COVID-19 declaration under Section 564(b)(1) of the Act, 21 U.S.C. section 360bbb-3(b)(1), unless the authorization is terminated or revoked.  Performed at [facility], [place] [address], [place], [place] [address]   Urine Culture     Status: Abnormal   Collection Time: 02/17/22 11:49 PM   Specimen: Urine, Clean Catch  Result Value Ref Range Status   Specimen Description   Final    URINE, CLEAN CATCH Performed at [facility], [place] [address], [place], [place] [address]    Special Requests   Final    NONE Performed at [facility], [place] [address], [place], [place] [address]    Culture (A)  Final    <10,000 COLONIES/mL INSIGNIFICANT GROWTH Performed at [facility] [address], [place], [place] [address]    Report Status 02/19/2022 FINAL  Final      Radiology Studies: DG Chest 2 View  Result Date: 02/17/2022 CLINICAL DATA:  Suspected Sepsis EXAM: CHEST - 2 VIEW COMPARISON:  Chest x-ray 08/19/2020 FINDINGS: The heart and mediastinal contours are unchanged. Aortic calcification. No  focal consolidation. No pulmonary edema. Chronic blunting of the left costophrenic angle with likely underlying scarring. Possible trace pleural effusions. No pneumothorax. No acute osseous abnormality. IMPRESSION: Possible trace pleural effusions. Electronically Signed   By: Iven Finn M.D.   On: 02/17/2022 21:33      Scheduled Meds:  acyclovir  400 mg Oral BID   alfuzosin  10 mg Oral QPM   calcium-vitamin D  1 tablet Oral BID WC   dabigatran  150 mg Oral BID   diltiazem  120 mg Oral Daily   donepezil  10 mg Oral Daily   dutasteride  0.5 mg Oral QPM   memantine  10 mg Oral BID   metoprolol succinate  25 mg Oral q morning   montelukast  10 mg Oral q AM   pantoprazole  40 mg Oral Daily   pravastatin  20 mg Oral Daily   tadalafil  5 mg Oral Daily   Continuous Infusions:  piperacillin-tazobactam (ZOSYN)  IV 3.375 g (02/19/22 1006)     LOS: 2 days   Time spent: 30 minutes   Dessa Phi, DO [facility] 02/19/2022, 10:08 AM   Available via Epic secure chat 7am-7pm After these hours, please refer to coverage provider listed on [email or web address]

## 2022-02-20 DIAGNOSIS — D709 Neutropenia, unspecified: Secondary | ICD-10-CM | POA: Diagnosis not present

## 2022-02-20 DIAGNOSIS — R5081 Fever presenting with conditions classified elsewhere: Secondary | ICD-10-CM | POA: Diagnosis not present

## 2022-02-20 LAB — CBC WITH DIFFERENTIAL/PLATELET
Abs Immature Granulocytes: 0.01 10*3/uL (ref 0.00–0.07)
Basophils Absolute: 0 10*3/uL (ref 0.0–0.1)
Basophils Relative: 1 %
Eosinophils Absolute: 0 10*3/uL (ref 0.0–0.5)
Eosinophils Relative: 2 %
HCT: 36.4 % — ABNORMAL LOW (ref 39.0–52.0)
Hemoglobin: 11.9 g/dL — ABNORMAL LOW (ref 13.0–17.0)
Immature Granulocytes: 1 %
Lymphocytes Relative: 8 %
Lymphs Abs: 0.1 10*3/uL — ABNORMAL LOW (ref 0.7–4.0)
MCH: 31 pg (ref 26.0–34.0)
MCHC: 32.7 g/dL (ref 30.0–36.0)
MCV: 94.8 fL (ref 80.0–100.0)
Monocytes Absolute: 0.2 10*3/uL (ref 0.1–1.0)
Monocytes Relative: 15 %
Neutro Abs: 1 10*3/uL — ABNORMAL LOW (ref 1.7–7.7)
Neutrophils Relative %: 73 %
Platelets: 90 10*3/uL — ABNORMAL LOW (ref 150–400)
RBC: 3.84 MIL/uL — ABNORMAL LOW (ref 4.22–5.81)
RDW: 15.8 % — ABNORMAL HIGH (ref 11.5–15.5)
WBC: 1.3 10*3/uL — CL (ref 4.0–10.5)
nRBC: 0 % (ref 0.0–0.2)

## 2022-02-20 LAB — BASIC METABOLIC PANEL
Anion gap: 6 (ref 5–15)
BUN: 13 mg/dL (ref 8–23)
CO2: 27 mmol/L (ref 22–32)
Calcium: 8.2 mg/dL — ABNORMAL LOW (ref 8.9–10.3)
Chloride: 107 mmol/L (ref 98–111)
Creatinine, Ser: 1.26 mg/dL — ABNORMAL HIGH (ref 0.61–1.24)
GFR, Estimated: 57 mL/min — ABNORMAL LOW (ref >60)
Glucose, Bld: 95 mg/dL (ref 70–99)
Potassium: 3.1 mmol/L — ABNORMAL LOW (ref 3.5–5.1)
Sodium: 140 mmol/L (ref 135–145)

## 2022-02-20 LAB — GLUCOSE, CAPILLARY: Glucose-Capillary: 93 mg/dL (ref 70–99)

## 2022-02-20 MED ORDER — AMOXICILLIN-POT CLAVULANATE 875-125 MG PO TABS: 1.0000 | ORAL_TABLET | Freq: Two times a day (BID) | ORAL | 0 refills | Status: AC

## 2022-02-20 MED ORDER — CIPROFLOXACIN HCL 500 MG PO TABS: 500.0000 mg | ORAL_TABLET | Freq: Two times a day (BID) | ORAL | 0 refills | Status: AC

## 2022-02-20 MED ORDER — POTASSIUM CHLORIDE CRYS ER 20 MEQ PO TBCR
40.0000 meq | EXTENDED_RELEASE_TABLET | ORAL | Status: AC
  Administered 2022-02-20 (×2): 40 meq via ORAL
  Filled 2022-02-20 (×2): qty 2

## 2022-02-20 NOTE — Progress Notes (Signed)
  Transition of Care [facility]) Screening Note   Patient Details  Name: [name]. Date of Birth: 26-Aug-1940   Transition of Care [facility]) CM/SW Contact:    Henrietta Dine, RN Phone Number: 02/20/2022, 9:39 AM    Transition of Care Department [facility]) has reviewed patient and no TOC needs have been identified at this time. We will continue to monitor patient advancement through interdisciplinary progression rounds. If new patient transition needs arise, please place a TOC consult.

## 2022-02-20 NOTE — Discharge Summary (Signed)
Physician Discharge Summary  [name]. UKG:[id number] DOB: 11-30-1940 DOA: 02/17/2022  PCP: Derinda Late, MD  Admit date: 02/17/2022 Discharge date: 02/20/2022  Admitted From: Home Disposition:  Home   Recommendations for Outpatient Follow-up:  Follow up with PCP in 1 week Follow up with hematology/oncology in 1 week and repeat CBC  Discharge Condition: Stable CODE STATUS: Full  Diet recommendation: Regular   Brief/Interim Summary: [name]. is an 81 y.o. male with medical history significant of multiple myeloma not currently in remission followed by oncology at [facility], Afib, GERD/barret's esophagus, BPH, asthma, gerd, hld, pancreatic cyst consistent with IPMN, renal  cell carcinoma in 2009 treated with RFA currently on surveillance, OSA on CPAP who presents to ED after noting fever at home.  He was admitted for neutropenic fever, was tested positive for RSV.  Other workup including blood culture and urine culture were negative.  He was started on empiric broad-spectrum antibiotics and monitor closely.  He remained afebrile for greater than 48 hours prior to discharge home.  Discharge Diagnoses:   Principal Problem:   Neutropenic fever ([place]) Active Problems:   Atrial fibrillation (HCC)   GERD   OSA on CPAP   Multiple myeloma (HCC)   Renal cell carcinoma of left kidney (HCC)   Pressure injury of skin   Dementia without behavioral disturbance (HCC)   Neutropenic fever -Insetting of multiple myeloma treatment  -Insetting of RSV positive -Blood cultures NGTD  -Urine culture negative  -Appreciate heme/onc consult -Afebrile > 48 hours. Counts improving -Discharge on PO antibiotics    A-fib -Diltiazem, metoprolol, Pradaxa   Dementia -Aricept, Namenda    Renal cell carcinoma -Treated with RFA currently on surveillance   OSA -CPAP nightly  Hypokalemia -Replaced   In agreement with assessment of the pressure ulcer as below:  Pressure Injury  02/18/22 Coccyx Mid Stage 1 -  Intact skin with non-blanchable redness of a localized area usually over a bony prominence. redness (Active)  02/18/22 1029  Location: Coccyx  Location Orientation: Mid  Staging: Stage 1 -  Intact skin with non-blanchable redness of a localized area usually over a bony prominence.  Wound Description (Comments): redness  Present on Admission: Yes  Dressing Type None 02/20/22 0807       Discharge Instructions  Discharge Instructions     Call MD for:  difficulty breathing, headache or visual disturbances   Complete by: As directed    Call MD for:  extreme fatigue   Complete by: As directed    Call MD for:  hives   Complete by: As directed    Call MD for:  persistant dizziness or light-headedness   Complete by: As directed    Call MD for:  persistant nausea and vomiting   Complete by: As directed    Call MD for:  severe uncontrolled pain   Complete by: As directed    Call MD for:  temperature >100.4   Complete by: As directed    Diet general   Complete by: As directed    Discharge instructions   Complete by: As directed    You were cared for by a hospitalist during your hospital stay. If you have any questions about your discharge medications or the care you received while you were in the hospital after you are discharged, you can call the unit and ask to speak with the hospitalist on call if the hospitalist that took care of you is not available. Once you are discharged,  your primary care physician will handle any further medical issues. Please note that NO REFILLS for any discharge medications will be authorized once you are discharged, as it is imperative that you return to your primary care physician (or establish a relationship with a primary care physician if you do not have one) for your aftercare needs so that they can reassess your need for medications and monitor your lab values.   Increase activity slowly   Complete by: As directed    No  wound care   Complete by: As directed       Allergies as of 02/20/2022       Reactions   Tamsulosin Other (See Comments)   dizziness   Proair Hfa [albuterol] Palpitations   Only use in emergency due to has heart problems        Medication List     STOP taking these medications    diltiazem 180 MG 24 hr capsule Commonly known as: CARDIZEM CD       TAKE these medications    acyclovir 400 MG tablet Commonly known as: ZOVIRAX Take 400 mg by mouth 2 (two) times daily.   alfuzosin 10 MG 24 hr tablet Commonly known as: UROXATRAL Take 1 tablet by mouth every evening.   AMBULATORY NON FORMULARY MEDICATION CPAP at night   amoxicillin-clavulanate 875-125 MG tablet Commonly known as: AUGMENTIN Take 1 tablet by mouth 2 (two) times daily for 4 days.   calcium citrate-vitamin D 315-200 MG-UNIT tablet Commonly known as: CITRACAL+D Take 1 tablet by mouth 2 (two) times daily.   ciprofloxacin 500 MG tablet Commonly known as: CIPRO Take 1 tablet (500 mg total) by mouth 2 (two) times daily for 4 days.   diltiazem 120 MG 24 hr capsule Commonly known as: TIAZAC TAKE ONE CAPSULE IN THE MORNING   donepezil 10 MG tablet Commonly known as: ARICEPT Take 10 mg by mouth daily.   dutasteride 0.5 MG capsule Commonly known as: AVODART Take 0.5 mg by mouth every evening.   lenalidomide 10 MG capsule Commonly known as: REVLIMID Take 10 mg by mouth as directed. Take 1 capsule (10 mg) by mouth on Days 1-21 of each 28 Day Cycle   memantine 10 MG tablet Commonly known as: NAMENDA Take 10 mg by mouth 2 (two) times daily.   metoprolol succinate 25 MG 24 hr tablet Commonly known as: TOPROL-XL Take 25 mg by mouth every morning.   montelukast 10 MG tablet Commonly known as: SINGULAIR TAKE 1 TABLET(10 MG) BY MOUTH AT BEDTIME What changed: See the new instructions.   omeprazole 20 MG capsule Commonly known as: PRILOSEC TAKE ONE CAPSULE BY MOUTH EVERY DAY   potassium chloride 10  MEQ CR capsule Commonly known as: MICRO-K Take 20 mEq by mouth daily.   Pradaxa 150 MG Caps capsule Generic drug: dabigatran TAKE ONE CAPSULE BY MOUTH TWICE DAILY   pravastatin 20 MG tablet Commonly known as: PRAVACHOL Take 20 mg daily by mouth.   tadalafil 5 MG tablet Commonly known as: CIALIS Take 1 tablet by mouth daily.        Follow-up Information     Derinda Late, MD Follow up.   Specialty: Family Medicine Contact information: [place] [address] [phone]         Pleas Koch, MD Follow up.   Specialty: Hematology and Oncology Contact information: [place] [address] [phone]  Allergies  Allergen Reactions   Tamsulosin Other (See Comments)    dizziness   Proair Hfa [Albuterol] Palpitations    Only use in emergency due to has heart problems    Consultations: Heme-onc   Procedures/Studies: DG Chest 2 View  Result Date: 02/17/2022 CLINICAL DATA:  Suspected Sepsis EXAM: CHEST - 2 VIEW COMPARISON:  Chest x-ray 08/19/2020 FINDINGS: The heart and mediastinal contours are unchanged. Aortic calcification. No focal consolidation. No pulmonary edema. Chronic blunting of the left costophrenic angle with likely underlying scarring. Possible trace pleural effusions. No pneumothorax. No acute osseous abnormality. IMPRESSION: Possible trace pleural effusions. Electronically Signed   By: Iven Finn M.D.   On: 02/17/2022 21:33       Discharge Exam: Vitals:   02/19/22 2040 02/20/22 0620  BP: 119/62 (!) 156/81  Pulse: 87 83  Resp: 18 18  Temp: 99.1 F (37.3 C) 98.3 F (36.8 C)  SpO2: 97% 97%     General: Pt is alert, awake, not in acute distress Cardiovascular: Irregular rhythm, S1/S2 +, no edema Respiratory: CTA bilaterally, no wheezing, no rhonchi, no respiratory distress, no conversational dyspnea, on room air Abdominal: Soft, NT, ND, bowel sounds  + Extremities: no edema, no cyanosis Psych: Normal mood and affect, stable judgement and insight     The results of significant diagnostics from this hospitalization (including imaging, microbiology, ancillary and laboratory) are listed below for reference.     Microbiology: Recent Results (from the past 240 hour(s))  Culture, blood (Routine x 2)     Status: None (Preliminary result)   Collection Time: 02/17/22  9:30 PM   Specimen: BLOOD  Result Value Ref Range Status   Specimen Description   Final    BLOOD BLOOD LEFT ARM Performed at [place] [facility] [address], [place], [place] [address]    Special Requests   Final    BOTTLES DRAWN AEROBIC AND ANAEROBIC Blood Culture adequate volume Performed at [facility] [address] [place], [place] [address]    Culture   Final    NO GROWTH 2 DAYS Performed at [facility] [address], [place], [place] [address]    Report Status PENDING  Incomplete  Culture, blood (Routine x 2)     Status: None (Preliminary result)   Collection Time: 02/17/22  9:44 PM   Specimen: BLOOD  Result Value Ref Range Status   Specimen Description   Final    BLOOD BLOOD RIGHT WRIST Performed at [facility] [address] [place], [place] [address]    Special Requests   Final    BOTTLES DRAWN AEROBIC AND ANAEROBIC Blood Culture adequate volume Performed at [facility] [address], [place], [place] [address]    Culture   Final    NO GROWTH 2 DAYS Performed at [facility] [place] [address], [place], [place] [address]    Report Status PENDING  Incomplete  Resp panel by RT-PCR (RSV, Flu A&B, Covid) Anterior Nasal Swab     Status: Abnormal   Collection Time: 02/17/22  9:50 PM   Specimen: Anterior Nasal Swab  Result Value Ref Range Status   SARS Coronavirus 2 by RT PCR NEGATIVE NEGATIVE Final    Comment: (NOTE) SARS-CoV-2 target nucleic acids are NOT  DETECTED.  The SARS-CoV-2 RNA is generally detectable in upper respiratory specimens during the acute phase of infection. The lowest concentration of SARS-CoV-2 viral copies this assay can detect is 138  copies/mL. A negative result does not preclude SARS-Cov-2 infection and should not be used as the sole basis for treatment or other patient management decisions. A negative result may occur with  improper specimen collection/handling, submission of specimen other than nasopharyngeal swab, presence of viral mutation(s) within the areas targeted by this assay, and inadequate number of viral copies(<138 copies/mL). A negative result must be combined with clinical observations, patient history, and epidemiological information. The expected result is Negative.  Fact Sheet for Patients:  EntrepreneurPulse.com.au  Fact Sheet for Healthcare Providers:  IncredibleEmployment.be  This test is no t yet approved or cleared by the [place] FDA and  has been authorized for detection and/or diagnosis of SARS-CoV-2 by FDA under an Emergency Use Authorization (EUA). This EUA will remain  in effect (meaning this test can be used) for the duration of the COVID-19 declaration under Section 564(b)(1) of the Act, 21 U.S.C.section 360bbb-3(b)(1), unless the authorization is terminated  or revoked sooner.       Influenza A by PCR NEGATIVE NEGATIVE Final   Influenza B by PCR NEGATIVE NEGATIVE Final    Comment: (NOTE) The Xpert Xpress SARS-CoV-2/FLU/RSV plus assay is intended as an aid in the diagnosis of influenza from Nasopharyngeal swab specimens and should not be used as a sole basis for treatment. Nasal washings and aspirates are unacceptable for Xpert Xpress SARS-CoV-2/FLU/RSV testing.  Fact Sheet for Patients: EntrepreneurPulse.com.au  Fact Sheet for Healthcare Providers: IncredibleEmployment.be  This test is not yet  approved or cleared by the [place] FDA and has been authorized for detection and/or diagnosis of SARS-CoV-2 by FDA under an Emergency Use Authorization (EUA). This EUA will remain in effect (meaning this test can be used) for the duration of the COVID-19 declaration under Section 564(b)(1) of the Act, 21 U.S.C. section 360bbb-3(b)(1), unless the authorization is terminated or revoked.     Resp Syncytial Virus by PCR POSITIVE (A) NEGATIVE Final    Comment: (NOTE) Fact Sheet for Patients: EntrepreneurPulse.com.au  Fact Sheet for Healthcare Providers: IncredibleEmployment.be  This test is not yet approved or cleared by the [place] FDA and has been authorized for detection and/or diagnosis of SARS-CoV-2 by FDA under an Emergency Use Authorization (EUA). This EUA will remain in effect (meaning this test can be used) for the duration of the COVID-19 declaration under Section 564(b)(1) of the Act, 21 U.S.C. section 360bbb-3(b)(1), unless the authorization is terminated or revoked.  Performed at [facility], [place] [address] [place], [place] [address]   Urine Culture     Status: Abnormal   Collection Time: 02/17/22 11:49 PM   Specimen: Urine, Clean Catch  Result Value Ref Range Status   Specimen Description   Final    URINE, CLEAN CATCH Performed at [facility], [place] [address], [place], [place] [address]    Special Requests   Final    NONE Performed at [facility], [place] [address], [place], [place] [address]    Culture (A)  Final    <10,000 COLONIES/mL INSIGNIFICANT GROWTH Performed at [facility] [address], [place],  [address]    Report Status 02/19/2022 FINAL  Final     Labs: BNP (last 3 results) No results for input(s): "BNP" in the last 8760 hours. Basic Metabolic Panel: Recent Labs  Lab 02/17/22 2130 02/18/22 1042 02/19/22 0655 02/20/22 0559   NA 139 139 142 140  K 2.8* 3.2* 3.6 3.1*  CL 107 106 112* 107  CO2  _0 GLUCOSE 141* 102* 95 95  BUN _1 CREATININE 1.12 1.13 1.23 1.26*  CALCIUM 8.5* 8.3* 7.9* 8.2*  MG  --  2.2 2.0  --    Liver Function Tests: Recent Labs  Lab 02/17/22 2130 02/18/22 1042  AST 14* 13*  ALT 14 14  ALKPHOS 52 47  BILITOT 1.3* 1.3*  PROT 6.0* 5.8*  ALBUMIN 3.6 3.3*   No results for input(s): "LIPASE", "AMYLASE" in the last 168 hours. No results for input(s): "AMMONIA" in the last 168 hours. CBC: Recent Labs  Lab 02/17/22 2130 02/18/22 1042 02/19/22 0655 02/20/22 0559  WBC 1.0* 1.0* 1.0* 1.3*  NEUTROABS 0.8*  --  0.7* 1.0*  HGB 10.3* 10.6* 10.4* 11.9*  HCT 30.9* 32.6* 31.8* 36.4*  MCV 94.8 95.6 96.4 94.8  PLT 73* 72* 75* 90*   Cardiac Enzymes: No results for input(s): "CKTOTAL", "CKMB", "CKMBINDEX", "TROPONINI" in the last 168 hours. BNP: Invalid input(s): "POCBNP" CBG: Recent Labs  Lab 02/18/22 0809 02/19/22 0746 02/20/22 0751  GLUCAP 92 92 93   D-Dimer No results for input(s): "DDIMER" in the last 72 hours. Hgb A1c No results for input(s): "HGBA1C" in the last 72 hours. Lipid Profile No results for input(s): "CHOL", "HDL", "LDLCALC", "TRIG", "CHOLHDL", "LDLDIRECT" in the last 72 hours. Thyroid function studies No results for input(s): "TSH", "T4TOTAL", "T3FREE", "THYROIDAB" in the last 72 hours.  Invalid input(s): "FREET3" Anemia work up No results for input(s): "VITAMINB12", "FOLATE", "FERRITIN", "TIBC", "IRON", "RETICCTPCT" in the last 72 hours. Urinalysis    Component Value Date/Time   COLORURINE YELLOW 02/17/2022 2348   APPEARANCEUR CLEAR 02/17/2022 2348   LABSPEC 1.023 02/17/2022 2348   PHURINE 5.0 02/17/2022 2348   GLUCOSEU NEGATIVE 02/17/2022 2348   HGBUR MODERATE (A) 02/17/2022 2348   BILIRUBINUR NEGATIVE 02/17/2022 2348   BILIRUBINUR neg 11/19/2012 0831   KETONESUR NEGATIVE 02/17/2022 2348   PROTEINUR 30 (A) 02/17/2022 2348    UROBILINOGEN 0.2 11/19/2012 0831   NITRITE NEGATIVE 02/17/2022 2348   LEUKOCYTESUR NEGATIVE 02/17/2022 2348   Sepsis Labs Recent Labs  Lab 02/17/22 2130 02/18/22 1042 02/19/22 0655 02/20/22 0559  WBC 1.0* 1.0* 1.0* 1.3*   Microbiology Recent Results (from the past 240 hour(s))  Culture, blood (Routine x 2)     Status: None (Preliminary result)   Collection Time: 02/17/22  9:30 PM   Specimen: BLOOD  Result Value Ref Range Status   Specimen Description   Final    BLOOD BLOOD LEFT ARM Performed at [facility], [place] [address] [place], [place] [address]    Special Requests   Final    BOTTLES DRAWN AEROBIC AND ANAEROBIC Blood Culture adequate volume Performed at [facility] [address], [place], [place] [address]    Culture   Final    NO GROWTH 2 DAYS Performed at [facility] [address], [place], [place] [address]    Report Status PENDING  Incomplete  Culture, blood (Routine x 2)     Status: None (Preliminary result)   Collection Time: 02/17/22  9:44 PM   Specimen: BLOOD  Result Value Ref Range Status   Specimen Description   Final    BLOOD BLOOD RIGHT WRIST Performed at [facility] [address], [place], [place] [address]    Special Requests   Final    BOTTLES DRAWN AEROBIC AND ANAEROBIC Blood Culture adequate volume Performed at [facility]  [address] [place], [place] [address]    Culture   Final    NO GROWTH 2 DAYS Performed at [facility] [address], [place], [place] [address]    Report Status PENDING  Incomplete  Resp panel by RT-PCR (RSV, Flu A&B, Covid) Anterior Nasal Swab     Status: Abnormal   Collection Time: 02/17/22  9:50 PM   Specimen: Anterior Nasal Swab  Result Value Ref Range Status   SARS Coronavirus 2 by RT PCR NEGATIVE NEGATIVE Final    Comment: (NOTE) SARS-CoV-2 target nucleic acids are NOT DETECTED.  The SARS-CoV-2 RNA is  generally detectable in upper respiratory specimens during the acute phase of infection. The lowest concentration of SARS-CoV-2 viral copies this assay can detect is 138 copies/mL. A negative result does not preclude SARS-Cov-2 infection and should not be used as the sole basis for treatment or other patient management decisions. A negative result may occur with  improper specimen collection/handling, submission of specimen other than nasopharyngeal swab, presence of viral mutation(s) within the areas targeted by this assay, and inadequate number of viral copies(<138 copies/mL). A negative result must be combined with clinical observations, patient history, and epidemiological information. The expected result is Negative.  Fact Sheet for Patients:  EntrepreneurPulse.com.au  Fact Sheet for Healthcare Providers:  IncredibleEmployment.be  This test is no t yet approved or cleared by the [place] FDA and  has been authorized for detection and/or diagnosis of SARS-CoV-2 by FDA under an Emergency Use Authorization (EUA). This EUA will remain  in effect (meaning this test can be used) for the duration of the COVID-19 declaration under Section 564(b)(1) of the Act, 21 U.S.C.section 360bbb-3(b)(1), unless the authorization is terminated  or revoked sooner.       Influenza A by PCR NEGATIVE NEGATIVE Final   Influenza B by PCR NEGATIVE NEGATIVE Final    Comment: (NOTE) The Xpert Xpress SARS-CoV-2/FLU/RSV plus assay is intended as an aid in the diagnosis of influenza from Nasopharyngeal swab specimens and should not be used as a sole basis for treatment. Nasal washings and aspirates are unacceptable for Xpert Xpress SARS-CoV-2/FLU/RSV testing.  Fact Sheet for Patients: EntrepreneurPulse.com.au  Fact Sheet for Healthcare Providers: IncredibleEmployment.be  This test is not yet approved or cleared by the [place] FDA and has been authorized for detection and/or diagnosis of SARS-CoV-2 by FDA under an Emergency Use Authorization (EUA). This EUA will remain in effect (meaning this test can be used) for the duration of the COVID-19 declaration under Section 564(b)(1) of the Act, 21 U.S.C. section 360bbb-3(b)(1), unless the authorization is terminated or revoked.     Resp Syncytial Virus by PCR POSITIVE (A) NEGATIVE Final    Comment: (NOTE) Fact Sheet for Patients: EntrepreneurPulse.com.au  Fact Sheet for Healthcare Providers: IncredibleEmployment.be  This test is not yet approved or cleared by the [place] FDA and has been authorized for detection and/or diagnosis of SARS-CoV-2 by FDA under an Emergency Use Authorization (EUA). This EUA will remain in effect (meaning this test can be used) for the duration of the COVID-19 declaration under Section 564(b)(1) of the Act, 21 U.S.C. section 360bbb-3(b)(1), unless the authorization is terminated or revoked.  Performed at [facility], [place] [address] [place], [place] [address]   Urine Culture     Status: Abnormal   Collection Time: 02/17/22 11:49 PM   Specimen: Urine, Clean Catch  Result Value Ref Range Status   Specimen Description   Final  URINE, CLEAN CATCH Performed at [facility], [place] [address], [place], [place] [address]    Special Requests   Final    NONE Performed at [facility], [place] [address], [place], [place] [address]    Culture (A)  Final    <10,000 COLONIES/mL INSIGNIFICANT GROWTH Performed at [facility] [address], [place], [place] [address]    Report Status 02/19/2022 FINAL  Final     Patient was seen and examined on the day of discharge and was found to be in stable condition. Time coordinating discharge: 25 minutes including assessment and coordination of care, as well as examination of the  patient.   SIGNED:  Dessa Phi, DO [facility] 02/20/2022, 9:04 AM

## 2022-02-23 LAB — CULTURE, BLOOD (ROUTINE X 2)
Culture: NO GROWTH
Culture: NO GROWTH
Special Requests: ADEQUATE
Special Requests: ADEQUATE

## 2022-04-07 ENCOUNTER — Encounter (HOSPITAL_COMMUNITY): Payer: Self-pay | Admitting: *Deleted

## 2022-04-13 ENCOUNTER — Ambulatory Visit: Payer: Self-pay | Admitting: Podiatry

## 2022-04-13 ENCOUNTER — Encounter: Payer: Self-pay | Admitting: Podiatry

## 2022-04-13 ENCOUNTER — Ambulatory Visit (INDEPENDENT_AMBULATORY_CARE_PROVIDER_SITE_OTHER): Payer: HMO | Admitting: Podiatry

## 2022-04-13 DIAGNOSIS — B351 Tinea unguium: Secondary | ICD-10-CM | POA: Diagnosis not present

## 2022-04-13 DIAGNOSIS — M79674 Pain in right toe(s): Secondary | ICD-10-CM | POA: Diagnosis not present

## 2022-04-13 DIAGNOSIS — M79675 Pain in left toe(s): Secondary | ICD-10-CM

## 2022-04-13 DIAGNOSIS — D689 Coagulation defect, unspecified: Secondary | ICD-10-CM

## 2022-04-13 NOTE — Progress Notes (Signed)
This patient returns to my office for at risk foot care.  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  Patient is taking pradaxa.This patient is unable to cut nails himself since the patient cannot reach his nails.These nails are painful walking and wearing shoes.  This patient presents for at risk foot care today.  General Appearance  Alert, conversant and in no acute stress.  Vascular  Dorsalis pedis  are weakly  palpable  bilaterally. Posterior tibial pulses are absent  Bilateral. Capillary return is within normal limits  Bilateral.  Cold feet.  Bilaterally. Absent hair noted.  Neurologic  Senn-Weinstein monofilament wire test within normal limits  bilaterally. Muscle power within normal limits bilaterally.  Nails Thick disfigured discolored nails with subungual debris  from hallux to fifth toes bilaterally. No evidence of bacterial infection or drainage bilaterally.  Orthopedic  No limitations of motion  feet .  No crepitus or effusions noted.  No bony pathology or digital deformities noted.  Hammer toes 2,3 right foot.  Skin  normotropic skin with no porokeratosis noted bilaterally.  No signs of infections or ulcers noted.  Asymptomatic diffuse callus right forefoot.   Onychomycosis  Pain in right toes  Pain in left toes  Consent was obtained for treatment procedures.   Mechanical debridement of nails 1-5  bilaterally performed with a nail nipper.  Filed with dremel without incident.    Return office visit   3 months.                  Told patient to return for periodic foot care and evaluation due to potential at risk complications.   Gardiner Barefoot DPM

## 2022-04-14 ENCOUNTER — Encounter: Payer: Medicare HMO | Admitting: Psychology

## 2022-04-15 ENCOUNTER — Encounter: Payer: Medicare HMO | Admitting: Psychology

## 2022-04-25 ENCOUNTER — Encounter: Payer: Medicare HMO | Admitting: Psychology

## 2022-05-12 ENCOUNTER — Other Ambulatory Visit (HOSPITAL_COMMUNITY): Payer: Self-pay

## 2022-05-12 ENCOUNTER — Telehealth: Payer: Self-pay | Admitting: Cardiology

## 2022-05-12 ENCOUNTER — Telehealth: Payer: Self-pay

## 2022-05-12 NOTE — Telephone Encounter (Addendum)
Pharmacy Patient Advocate Encounter   Received notification from [place] that prior authorization for PRADAXA 150 MG CAP is needed.   Karie Soda, CPhT Pharmacy Patient Advocate Specialist Direct Number: [phone] Fax: [phone]

## 2022-05-12 NOTE — Telephone Encounter (Signed)
Pt c/o medication issue:  1. Name of Medication: PRADAXA 150 MG CAPS capsule   2. How are you currently taking this medication (dosage and times per day)? TAKE ONE CAPSULE BY MOUTH TWICE DAILY   3. Are you having a reaction (difficulty breathing--STAT)? No   4. What is your medication issue? [employer] called wanting to get prior authorization for the pt's medication. [employer] disconnected the line before I could get a callback or fax number.

## 2022-05-16 ENCOUNTER — Other Ambulatory Visit (HOSPITAL_COMMUNITY): Payer: Self-pay

## 2022-05-23 ENCOUNTER — Other Ambulatory Visit (HOSPITAL_COMMUNITY): Payer: Self-pay

## 2022-05-23 NOTE — Telephone Encounter (Signed)
Per test claim PA is not needed. Patient filled on 05/02/22 next fill insurance will cover on 05/25/22

## 2022-06-02 NOTE — Telephone Encounter (Signed)
Caller stated she is follow-up on the status of patient's Pradaxa prescription.

## 2022-06-02 NOTE — Telephone Encounter (Signed)
[name] called and wanted to make sure patient does not need another PA. The picture below only states insurance will cover refill for 05/25/22. She would like to know will she need one for the next refill. Can you please clarify.

## 2022-06-03 ENCOUNTER — Other Ambulatory Visit (HOSPITAL_COMMUNITY): Payer: Self-pay

## 2022-06-03 NOTE — Telephone Encounter (Signed)
Hi Demetris,   Per test claim rejection insurance is not requiring a PA at this time. Patient should not have any issues filling. Patient has picked up medication since last claim was ran on 05/23/22. Last filled 05/31/22 next fill on 06/23/22.

## 2022-06-03 NOTE — Telephone Encounter (Signed)
Left voicemail to return call to office.

## 2022-07-01 ENCOUNTER — Other Ambulatory Visit: Payer: Self-pay | Admitting: Cardiology

## 2022-07-01 DIAGNOSIS — I482 Chronic atrial fibrillation, unspecified: Secondary | ICD-10-CM

## 2022-07-01 NOTE — Telephone Encounter (Signed)
Prescription refill request for Pradaxa received.  Indication:Afib  Last office visit: 10/05/20 ([place])  Weight: 76.4kg Age: 82 Scr: 1.36 (05/19/22)  CrCl: 44.79ml/min  Office visit overdue. Pt has scheduled appt on 09/05/22. Refill sent.

## 2022-07-08 ENCOUNTER — Encounter: Payer: Self-pay | Admitting: Podiatry

## 2022-07-08 ENCOUNTER — Ambulatory Visit (INDEPENDENT_AMBULATORY_CARE_PROVIDER_SITE_OTHER): Payer: HMO | Admitting: Podiatry

## 2022-07-08 DIAGNOSIS — M79674 Pain in right toe(s): Secondary | ICD-10-CM | POA: Diagnosis not present

## 2022-07-08 DIAGNOSIS — B351 Tinea unguium: Secondary | ICD-10-CM

## 2022-07-08 DIAGNOSIS — M79675 Pain in left toe(s): Secondary | ICD-10-CM

## 2022-07-08 DIAGNOSIS — D689 Coagulation defect, unspecified: Secondary | ICD-10-CM | POA: Diagnosis not present

## 2022-07-08 NOTE — Progress Notes (Signed)
This patient returns to my office for at risk foot care.  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  Patient is taking pradaxa.This patient is unable to cut nails himself since the patient cannot reach his nails.These nails are painful walking and wearing shoes.  This patient presents for at risk foot care today.  General Appearance  Alert, conversant and in no acute stress.  Vascular  Dorsalis pedis  are weakly  palpable  bilaterally. Posterior tibial pulses are absent  Bilateral. Capillary return is within normal limits  Bilateral.  Cold feet.  Bilaterally. Absent hair noted.  Neurologic  Senn-Weinstein monofilament wire test within normal limits  bilaterally. Muscle power within normal limits bilaterally.  Nails Thick disfigured discolored nails with subungual debris  from hallux to fifth toes bilaterally. No evidence of bacterial infection or drainage bilaterally.  Orthopedic  No limitations of motion  feet .  No crepitus or effusions noted.  No bony pathology or digital deformities noted.  Hammer toes 2,3 right foot.  Skin  normotropic skin with no porokeratosis noted bilaterally.  No signs of infections or ulcers noted.  Asymptomatic diffuse callus right forefoot.   Onychomycosis  Pain in right toes  Pain in left toes  Consent was obtained for treatment procedures.   Mechanical debridement of nails 1-5  bilaterally performed with a nail nipper.  Filed with dremel without incident.    Return office visit   3 months.                  Told patient to return for periodic foot care and evaluation due to potential at risk complications.   Kierre Deines DPM  

## 2022-07-14 ENCOUNTER — Ambulatory Visit: Payer: HMO | Admitting: Podiatry

## 2022-07-15 ENCOUNTER — Ambulatory Visit: Payer: HMO | Admitting: Podiatry

## 2022-08-29 ENCOUNTER — Ambulatory Visit (INDEPENDENT_AMBULATORY_CARE_PROVIDER_SITE_OTHER): Payer: HMO | Admitting: Podiatry

## 2022-08-29 DIAGNOSIS — L84 Corns and callosities: Secondary | ICD-10-CM

## 2022-08-29 NOTE — Progress Notes (Signed)
Chief Complaint  Patient presents with   Foot Swelling    Bilateral dryness and swelling in feet    HPI: 82 y.o. male presenting today for concern of pinch callus to the bilateral first MTP.  Patient states that he does not go barefoot.  He wears good supportive tennis shoes and sneakers.  Past Medical History:  Diagnosis Date   Allergy    Arrhythmia    Atrial fibrillation (HCC)    Barrett esophagus    Barrett's esophagus    Blood transfusion without reported diagnosis    BPH (benign prostatic hyperplasia)    Cervical stenosis of spine    Chronic obstructive asthma    Difficult intubation 13 or 14 years ago, at [facility] long   airway was up near nostrils with intubation per patient with bowel surgery   ED (erectile dysfunction)    GERD (gastroesophageal reflux disease)    Hammer toe of right foot    Hiatal hernia    Hyperlipidemia    Hypertension    OSA (obstructive sleep apnea) 08/05/10 PSG   AHI 24   Paroxysmal atrial fibrillation (HCC)    Hospitalized on 05/27/10   Renal cell carcinoma 2009   right renal mass, followed by Dr. Teodora Medici   Sleep apnea     Past Surgical History:  Procedure Laterality Date   BASAL CELL CARCINOMA EXCISION     BIOPSY  12/18/2017   Procedure: BIOPSY;  Surgeon: Meryl Dare, MD;  Location: [facility];  Service: Endoscopy;;   ESOPHAGOGASTRODUODENOSCOPY (EGD) WITH PROPOFOL N/A 08/26/2014   Procedure: ESOPHAGOGASTRODUODENOSCOPY (EGD) WITH PROPOFOL;  Surgeon: Meryl Dare, MD;  Location: [facility];  Service: Endoscopy;  Laterality: N/A;   ESOPHAGOGASTRODUODENOSCOPY (EGD) WITH PROPOFOL N/A 12/18/2017   Procedure: ESOPHAGOGASTRODUODENOSCOPY (EGD) WITH PROPOFOL;  Surgeon: Meryl Dare, MD;  Location: [facility];  Service: Endoscopy;  Laterality: N/A;   EXPLORATORY LAPAROTOMY W/ BOWEL RESECTION  12/1999   segmental sbr of a hemangioma, repair of incarcerated umbilical hernia   exploratory laparotomy, segmental small bowel resection  of a hemangioma, repair of incarcerated umbelical hernia  12/1999   EYE SURGERY     cataracts done   renal cancer surgery with RF Ablation     TONSILLECTOMY      Allergies  Allergen Reactions   Tamsulosin Other (See Comments)    dizziness   Proair Hfa [Albuterol] Palpitations    Only use in emergency due to has heart problems     Physical Exam: General: The patient is alert and oriented x3 in no acute distress.  Dermatology: Skin is warm, dry and supple bilateral lower extremities.  Pinch callus noted to the medial aspect of the bilateral first MTP.  No open wound or fissuring.  Vascular: Palpable pedal pulses bilaterally. Capillary refill within normal limits.  No appreciable edema.  No erythema.  Neurological: Protective threshold and light touch diminished  Musculoskeletal Exam: No pedal deformities noted  Assessment/Plan of Care: 1.  Pinch callus bilateral first MTP medial aspect  -Advised against going barefoot.  Recommend good supportive socks and tennis shoes -Patient wears light compression hose.  Continue -Recommend daily foot lotion -Return to clinic as needed       Felecia Shelling, DPM [facility]  Dr. Felecia Shelling, DPM    [address]  [place], [place] [address]                Office [phone]  Fax [phone]

## 2022-09-01 NOTE — Progress Notes (Signed)
[name]. Date of Birth: [birth year] Medical Record #[id number]  History of Present Illness: [name] is seen for follow up Afib.He has a history of atrial fibrillation dating back to 2012. Initially this was paroxysmal but multiple Ecgs since then show persistent Afib with controlled rate. He is managed with metoprolol, diltiazem, and pradaxa.   He was seen in the Afib clinic this past June with elevated HR in Afib. Cardizem dose was increased. He had been diagnosed with multiple myeloma and was receiving chemotherapy.  He was admitted in Dec 2023 with fever in setting of neutropenia. Tested positive for RSV.  On follow up he continues with therapy with Daratumumab once a month. On Revlimid.  He is tolerating this well.   He did have some swelling in legs back in May. Was put on chlorthalidone but this resulted in hypotension. Now just using compression hose.   Allergies as of 09/05/2022       Reactions   Tamsulosin Other (See Comments)   dizziness   Proair Hfa [albuterol] Palpitations   Only use in emergency due to has heart problems        Medication List        Accurate as of September 05, 2022  9:35 AM. If you have any questions, ask your nurse or doctor.          acyclovir 400 MG tablet Commonly known as: ZOVIRAX Take 400 mg by mouth 2 (two) times daily.   alfuzosin 10 MG 24 hr tablet Commonly known as: UROXATRAL Take 1 tablet by mouth every evening.   AMBULATORY NON FORMULARY MEDICATION CPAP at night   calcium citrate-vitamin D 315-200 MG-UNIT tablet Commonly known as: CITRACAL+D Take 1 tablet by mouth 2 (two) times daily.   chlorthalidone 25 MG tablet Commonly known as: HYGROTON Take 25 mg by mouth daily.   diltiazem 120 MG 24 hr capsule Commonly known as: TIAZAC TAKE ONE CAPSULE IN THE MORNING   donepezil 10 MG tablet Commonly known as: ARICEPT Take 10 mg by mouth daily.   dutasteride 0.5 MG capsule Commonly known as: AVODART Take 0.5 mg by  mouth every evening.   lenalidomide 10 MG capsule Commonly known as: REVLIMID Take 10 mg by mouth as directed. Take 1 capsule (10 mg) by mouth on Days 1-21 of each 28 Day Cycle   memantine 10 MG tablet Commonly known as: NAMENDA Take 10 mg by mouth 2 (two) times daily.   metoprolol succinate 25 MG 24 hr tablet Commonly known as: TOPROL-XL Take 25 mg by mouth every morning.   montelukast 10 MG tablet Commonly known as: SINGULAIR TAKE 1 TABLET(10 MG) BY MOUTH AT BEDTIME What changed: See the new instructions.   omeprazole 20 MG capsule Commonly known as: PRILOSEC TAKE ONE CAPSULE BY MOUTH EVERY DAY   potassium chloride 10 MEQ CR capsule Commonly known as: MICRO-K Take 20 mEq by mouth daily.   Pradaxa 150 MG Caps capsule Generic drug: dabigatran TAKE ONE CAPSULE BY MOUTH TWICE DAILY   pravastatin 20 MG tablet Commonly known as: PRAVACHOL Take 20 mg daily by mouth.   tadalafil 5 MG tablet Commonly known as: CIALIS Take 1 tablet by mouth daily.         Allergies  Allergen Reactions   Tamsulosin Other (See Comments)    dizziness   Proair Hfa [Albuterol] Palpitations    Only use in emergency due to has heart problems    Past Medical History:  Diagnosis Date  Allergy    Arrhythmia    Atrial fibrillation (HCC)    Barrett esophagus    Barrett's esophagus    Blood transfusion without reported diagnosis    BPH (benign prostatic hyperplasia)    Cervical stenosis of spine    Chronic obstructive asthma    Difficult intubation 13 or 14 years ago, at [facility] long   airway was up near nostrils with intubation per patient with bowel surgery   ED (erectile dysfunction)    GERD (gastroesophageal reflux disease)    Hammer toe of right foot    Hiatal hernia    Hyperlipidemia    Hypertension    OSA (obstructive sleep apnea) 08/05/10 PSG   AHI 24   Paroxysmal atrial fibrillation (HCC)    Hospitalized on 05/27/10   Renal cell carcinoma 2009   right renal mass,  followed by Dr. Teodora Medici   Sleep apnea     Past Surgical History:  Procedure Laterality Date   BASAL CELL CARCINOMA EXCISION     BIOPSY  12/18/2017   Procedure: BIOPSY;  Surgeon: Meryl Dare, MD;  Location: [facility];  Service: Endoscopy;;   ESOPHAGOGASTRODUODENOSCOPY (EGD) WITH PROPOFOL N/A 08/26/2014   Procedure: ESOPHAGOGASTRODUODENOSCOPY (EGD) WITH PROPOFOL;  Surgeon: Meryl Dare, MD;  Location: [facility];  Service: Endoscopy;  Laterality: N/A;   ESOPHAGOGASTRODUODENOSCOPY (EGD) WITH PROPOFOL N/A 12/18/2017   Procedure: ESOPHAGOGASTRODUODENOSCOPY (EGD) WITH PROPOFOL;  Surgeon: Meryl Dare, MD;  Location: [facility];  Service: Endoscopy;  Laterality: N/A;   EXPLORATORY LAPAROTOMY W/ BOWEL RESECTION  12/1999   segmental sbr of a hemangioma, repair of incarcerated umbilical hernia   exploratory laparotomy, segmental small bowel resection of a hemangioma, repair of incarcerated umbelical hernia  12/1999   EYE SURGERY     cataracts done   renal cancer surgery with RF Ablation     TONSILLECTOMY      Social History   Socioeconomic History   Marital status: Divorced    Spouse name: Not on file   Number of children: 4   Years of education: Not on file   Highest education level: Not on file  Occupational History   Occupation: tv producer/construction   Occupation: Radio broadcast assistant  Tobacco Use   Smoking status: Former    Types: Cigars    Quit date: 02/29/1980    Years since quitting: 42.5   Smokeless tobacco: Never   Tobacco comments:    quit when he was 82 years old  Vaping Use   Vaping Use: Never used  Substance and Sexual Activity   Alcohol use: No   Drug use: No   Sexual activity: Not Currently  Other Topics Concern   Not on file  Social History Narrative   ** Merged History Encounter ** Married. Education: [employer]. Exercise: Eleptical times a week for 45 minutes.          Social Determinants of Health   Financial Resource Strain: Not on file   Food Insecurity: No Food Insecurity (02/18/2022)   Hunger Vital Sign    Worried About Running Out of Food in the Last Year: Never true    Ran Out of Food in the Last Year: Never true  Transportation Needs: No Transportation Needs (02/18/2022)   PRAPARE - Administrator, Civil Service (Medical): No    Lack of Transportation (Non-Medical): No  Physical Activity: Not on file  Stress: Not on file  Social Connections: Not on file    Family History  Problem Relation Age  of Onset   Stroke Mother    Diabetes Mother    Diabetes Father    Obesity Brother    Diabetes Brother    Kidney cancer Paternal Aunt    Asthma Paternal Grandmother    Heart attack Paternal Grandfather    Colon cancer Neg Hx    Esophageal cancer Neg Hx    Rectal cancer Neg Hx    Stomach cancer Neg Hx    Dementia Neg Hx    Alzheimer's disease Neg Hx     Review of Systems: As noted in HPI.  All other systems were reviewed and are negative.  Physical Exam: BP 113/68 (BP Location: Right Arm, Patient Position: Sitting, Cuff Size: Normal)   Pulse 82   Ht 5\' 10"  (1.778 m)   Wt 179 lb 3.2 oz (81.3 kg)   SpO2 98%   BMI 25.71 kg/m  Filed Weights   09/05/22 0922  Weight: 179 lb 3.2 oz (81.3 kg)     GENERAL:  frail appearing  NAD HEENT:  PERRL, EOMI, sclera are clear. Oropharynx is clear. NECK:  No jugular venous distention, carotid upstroke brisk and symmetric, no bruits, no thyromegaly or adenopathy LUNGS:  Clear to auscultation bilaterally CHEST:  Unremarkable HEART:  IRRR,  PMI not displaced or sustained,S1 and S2 within normal limits, no S3, no S4: no clicks, no rubs, no murmurs ABD:  Soft, nontender. BS +, no masses or bruits. No hepatomegaly, no splenomegaly EXT:  2 + pulses throughout, no edema SKIN:  Warm and dry.  No rashes NEURO:  Alert and oriented x 3. Cranial nerves II through XII intact. PSYCH:  Cognitively intact   LABORATORY DATA:  Lab Results  Component Value Date   WBC 1.3  (LL) 02/20/2022   HGB 11.9 (L) 02/20/2022   HCT 36.4 (L) 02/20/2022   PLT 90 (L) 02/20/2022   GLUCOSE 95 02/20/2022   CHOL 124 11/08/2017   TRIG 56 11/08/2017   HDL 49 11/08/2017   LDLCALC 64 11/08/2017   ALT 14 02/18/2022   AST 13 (L) 02/18/2022   NA 140 02/20/2022   K 3.1 (L) 02/20/2022   CL 107 02/20/2022   CREATININE 1.26 (H) 02/20/2022   BUN 13 02/20/2022   CO2 27 02/20/2022   TSH 2.005 11/19/2012   PSA 2.79 11/19/2012   INR 1.3 (H) 02/17/2022   Dated 03/26/20: Hgb 11.2. WBC 2.6. plts 154k. CMET normal Dated 09/10/20: Hgb 12.5, WBC 5.7, plts 148K. CMET normal.   EKG Interpretation Date/Time:  Monday September 05 2022 09:18:39 EDT Ventricular Rate:  82 PR Interval:    QRS Duration:  88 QT Interval:  414 QTC Calculation: 483 R Axis:   -13  Text Interpretation: Atrial flutter with variable A-V block Possible Anterior infarct , age undetermined When compared with ECG of 08-Aug-2019 11:32, No significant change since last tracing Confirmed by [place], Quaran Kedzierski [phone]) on 09/05/2022 9:32:37 AM     Assessment / Plan: 1. Atrial fibrillation. Permanent.  HR is well controlled on Cardizem  180 mg daily. Continue Toprol XL.  Continue  Pradaxa.  I will follow up in one year  2. CKD stage 2. Stable.  3. Multiple myeloma. Per heme-onc  4. LE edema. Exam is normal today. Continue compression hose

## 2022-09-05 ENCOUNTER — Encounter: Payer: Self-pay | Admitting: Cardiology

## 2022-09-05 ENCOUNTER — Ambulatory Visit: Payer: HMO | Admitting: Cardiology

## 2022-09-05 VITALS — BP 113/68 | HR 82 | Ht 70.0 in | Wt 179.2 lb

## 2022-09-05 DIAGNOSIS — C9000 Multiple myeloma not having achieved remission: Secondary | ICD-10-CM | POA: Diagnosis not present

## 2022-09-05 DIAGNOSIS — I482 Chronic atrial fibrillation, unspecified: Secondary | ICD-10-CM | POA: Diagnosis not present

## 2022-09-05 DIAGNOSIS — D6869 Other thrombophilia: Secondary | ICD-10-CM | POA: Diagnosis not present

## 2022-09-05 NOTE — Patient Instructions (Signed)
Medication Instructions:  Continue same medications *If you need a refill on your cardiac medications before your next appointment, please call your pharmacy*   Lab Work: None ordered   Testing/Procedures: None ordered   Follow-Up: At [facility], you and your health needs are our priority.  As part of our continuing mission to provide you with exceptional heart care, we have created designated Provider Care Teams.  These Care Teams include your primary Cardiologist (physician) and Advanced Practice Providers (APPs -  Physician Assistants and Nurse Practitioners) who all work together to provide you with the care you need, when you need it.  We recommend signing up for the patient portal called "MyChart".  Sign up information is provided on this After Visit Summary.  MyChart is used to connect with patients for Virtual Visits (Telemedicine).  Patients are able to view lab/test results, encounter notes, upcoming appointments, etc.  Non-urgent messages can be sent to your provider as well.   To learn more about what you can do with MyChart, go to [email or web address].    Your next appointment:  1 year   Call in March to schedule July appointment     Provider:  Dr.Jordan   

## 2022-09-22 ENCOUNTER — Ambulatory Visit: Payer: Medicare HMO | Admitting: Cardiology

## 2022-09-28 ENCOUNTER — Ambulatory Visit (INDEPENDENT_AMBULATORY_CARE_PROVIDER_SITE_OTHER): Payer: HMO | Admitting: Podiatry

## 2022-09-28 ENCOUNTER — Encounter: Payer: Self-pay | Admitting: Podiatry

## 2022-09-28 DIAGNOSIS — M79674 Pain in right toe(s): Secondary | ICD-10-CM | POA: Diagnosis not present

## 2022-09-28 DIAGNOSIS — M79675 Pain in left toe(s): Secondary | ICD-10-CM

## 2022-09-28 DIAGNOSIS — B351 Tinea unguium: Secondary | ICD-10-CM

## 2022-09-28 DIAGNOSIS — D689 Coagulation defect, unspecified: Secondary | ICD-10-CM

## 2022-09-28 NOTE — Progress Notes (Signed)
This patient returns to my office for at risk foot care.  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  Patient is taking pradaxa.This patient is unable to cut nails himself since the patient cannot reach his nails.These nails are painful walking and wearing shoes.  This patient presents for at risk foot care today.  General Appearance  Alert, conversant and in no acute stress.  Vascular  Dorsalis pedis  are weakly  palpable  bilaterally. Posterior tibial pulses are absent  Bilateral. Capillary return is within normal limits  Bilateral.  Cold feet.  Bilaterally. Absent hair noted.  Neurologic  Senn-Weinstein monofilament wire test within normal limits  bilaterally. Muscle power within normal limits bilaterally.  Nails Thick disfigured discolored nails with subungual debris  from hallux to fifth toes bilaterally. No evidence of bacterial infection or drainage bilaterally.  Orthopedic  No limitations of motion  feet .  No crepitus or effusions noted.  No bony pathology or digital deformities noted.  Hammer toes 2,3 right foot.  Skin  normotropic skin with no porokeratosis noted bilaterally.  No signs of infections or ulcers noted.  Asymptomatic diffuse callus right forefoot.   Onychomycosis  Pain in right toes  Pain in left toes  Consent was obtained for treatment procedures.   Mechanical debridement of nails 1-5  bilaterally performed with a nail nipper.  Filed with dremel without incident.    Return office visit   3 months.                  Told patient to return for periodic foot care and evaluation due to potential at risk complications.   Gardiner Barefoot DPM

## 2022-10-28 ENCOUNTER — Ambulatory Visit (INDEPENDENT_AMBULATORY_CARE_PROVIDER_SITE_OTHER): Payer: HMO | Admitting: Pulmonary Disease

## 2022-10-28 ENCOUNTER — Encounter (HOSPITAL_BASED_OUTPATIENT_CLINIC_OR_DEPARTMENT_OTHER): Payer: Self-pay | Admitting: Pulmonary Disease

## 2022-10-28 VITALS — BP 138/68 | HR 73 | Resp 16 | Ht 70.0 in | Wt 178.7 lb

## 2022-10-28 DIAGNOSIS — G4733 Obstructive sleep apnea (adult) (pediatric): Secondary | ICD-10-CM

## 2022-10-28 DIAGNOSIS — J4520 Mild intermittent asthma, uncomplicated: Secondary | ICD-10-CM

## 2022-10-28 NOTE — Patient Instructions (Signed)
Follow up in 1 year.

## 2022-10-28 NOTE — Progress Notes (Signed)
[facility]  Chief Complaint  Patient presents with   Follow-up    Last OV 03/30/2021. He is not using CPAP machine states wife. Patient states he uses off and on.     Past Surgical History:  He  has a past surgical history that includes exploratory laparotomy, segmental small bowel resection of a hemangioma, repair of incarcerated umbelical hernia (12/1999); Exploratory laparotomy w/ bowel resection (12/1999); renal cancer surgery with RF Ablation; Tonsillectomy; Excision basal cell carcinoma; Eye surgery; Esophagogastroduodenoscopy (egd) with propofol (N/A, 08/26/2014); Esophagogastroduodenoscopy (egd) with propofol (N/A, 12/18/2017); and biopsy (12/18/2017).  Past Medical History:  Allergies, A fib, Barrett's esophagus, BPH, Cervical spinal stenosis, ED, Hiatal hernia, HLD, HTN, Renal call carcinoma 2009  Constitutional:  BP 138/68   Pulse 73   Resp 16   Ht 5\' 10"  (1.778 m)   Wt 178 lb 10.9 oz (81.1 kg)   SpO2 97%   BMI 25.64 kg/m   Brief Summary:  [name]. is a 82 y.o. 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      Subjective:   He is here with his wife.  He has dementia and poor hearing.  He is not longer able to drive anymore.  His wife says he sleep better with CPAP, but he forgets to put it on sometimes.  Not having cough, wheeze, or congestion.  Physical Exam:   Appearance - well kempt   ENMT - no sinus tenderness, no oral exudate, no LAN, Mallampati 3 airway, no stridor  Respiratory - equal breath sounds bilaterally, no wheezing or rales  CV - s1s2 regular rate and rhythm, no murmurs  Ext - no clubbing, no edema  Skin - no rashes  Psych - normal mood and affect   Pulmonary testing:  PFT 07/26/11>>FEV1 2.62(91%), FEV1% 66, TLC 6.55(101%), DLCO 94%, +BD  Sleep Tests:  PSG 08/05/10>>AHI 24.2, SpO2 low 77%, PLMI 15.8. PSG with oral appliance 02/07/12>>AHI 11.5, SpO2 low 78%. Both  obstructive and central events. Auto CPAP 01/14/14 to 02/12/14 >> used on 17 of 30 nights with average 4 hrs and 55 min.  Average AHI is 11 with median CPAP 7 cm H2O and 95 th percentile CPAP 9 cm H20.  Mostly central events.  Social History:  He  reports that he quit smoking about 42 years ago. His smoking use included cigars. He has been exposed to tobacco smoke. He has never used smokeless tobacco. He reports that he does not drink alcohol and does not use drugs.  Family History:  His family history includes Asthma in his paternal grandmother; Diabetes in his brother, father, and mother; Heart attack in his paternal grandfather; Kidney cancer in his paternal aunt; Obesity in his brother; Stroke in his mother.     Assessment/Plan:   Obstructive sleep apnea. - he is compliant with CPAP and reports benefit from therapy - uses [employer] for his DME - current CPAP ordered in January 2023 - continue auto CPAP 5 to 15 cm H2O  Mild COPD with asthma. - continue singulair 10 mg nightly - prn albuterol  Multiple myeloma. - followed by Dr. Starleen Arms with [facility]  Time Spent Involved in Patient Care on Day of Examination:  25 minutes  Follow up:   Patient Instructions  Follow up in 1 year  Medication List:   Allergies as of 10/28/2022       Reactions   Tamsulosin Other (See Comments)   dizziness  Proair Hfa [albuterol] Palpitations   Only use in emergency due to has heart problems        Medication List        Accurate as of October 28, 2022  3:31 PM. If you have any questions, ask your nurse or doctor.          STOP taking these medications    chlorthalidone 25 MG tablet Commonly known as: HYGROTON Stopped by: Coralyn Helling       TAKE these medications    acyclovir 400 MG tablet Commonly known as: ZOVIRAX Take 400 mg by mouth 2 (two) times daily.   alfuzosin 10 MG 24 hr tablet Commonly known as: UROXATRAL Take 1 tablet by mouth every evening.    AMBULATORY NON FORMULARY MEDICATION CPAP at night   calcium citrate-vitamin D 315-200 MG-UNIT tablet Commonly known as: CITRACAL+D Take 1 tablet by mouth 2 (two) times daily.   DARATUMUMAB IV Inject into the vein.   diltiazem 120 MG 24 hr capsule Commonly known as: TIAZAC TAKE ONE CAPSULE IN THE MORNING   donepezil 10 MG tablet Commonly known as: ARICEPT Take 10 mg by mouth daily.   dutasteride 0.5 MG capsule Commonly known as: AVODART Take 0.5 mg by mouth every evening.   lenalidomide 10 MG capsule Commonly known as: REVLIMID Take 10 mg by mouth as directed. Take 1 capsule (10 mg) by mouth on Days 1-21 of each 28 Day Cycle   Melatonin 1 MG Caps Take by mouth.   memantine 10 MG tablet Commonly known as: NAMENDA Take 10 mg by mouth 2 (two) times daily.   metoprolol succinate 25 MG 24 hr tablet Commonly known as: TOPROL-XL Take 25 mg by mouth every morning.   montelukast 10 MG tablet Commonly known as: SINGULAIR TAKE 1 TABLET(10 MG) BY MOUTH AT BEDTIME What changed: See the new instructions.   omeprazole 20 MG capsule Commonly known as: PRILOSEC TAKE ONE CAPSULE BY MOUTH EVERY DAY   potassium chloride 10 MEQ CR capsule Commonly known as: MICRO-K Take 20 mEq by mouth daily.   Pradaxa 150 MG Caps capsule Generic drug: dabigatran TAKE ONE CAPSULE BY MOUTH TWICE DAILY   pravastatin 20 MG tablet Commonly known as: PRAVACHOL Take 20 mg daily by mouth.   tadalafil 5 MG tablet Commonly known as: CIALIS Take 1 tablet by mouth daily.   zoledronic acid 4 MG/5ML injection Commonly known as: ZOMETA Inject 4 mg into the vein once.        Signature:  Coralyn Helling, MD [facility] Pulmonary/Critical Care Pager - [phone] 10/28/2022, 3:31 PM

## 2022-12-07 ENCOUNTER — Ambulatory Visit
Admission: RE | Admit: 2022-12-07 | Discharge: 2022-12-07 | Disposition: A | Discharge status: Home / Self Care | Payer: HMO | Source: Ambulatory Visit | Attending: Family Medicine | Admitting: Family Medicine

## 2022-12-07 ENCOUNTER — Other Ambulatory Visit: Payer: Self-pay | Admitting: Family Medicine

## 2022-12-07 DIAGNOSIS — R270 Ataxia, unspecified: Secondary | ICD-10-CM

## 2022-12-26 ENCOUNTER — Other Ambulatory Visit: Payer: Self-pay | Admitting: Family Medicine

## 2022-12-26 DIAGNOSIS — R270 Ataxia, unspecified: Secondary | ICD-10-CM

## 2022-12-26 DIAGNOSIS — M545 Low back pain, unspecified: Secondary | ICD-10-CM

## 2022-12-26 DIAGNOSIS — R29898 Other symptoms and signs involving the musculoskeletal system: Secondary | ICD-10-CM

## 2022-12-31 ENCOUNTER — Other Ambulatory Visit: Payer: Self-pay | Admitting: Cardiology

## 2022-12-31 DIAGNOSIS — I482 Chronic atrial fibrillation, unspecified: Secondary | ICD-10-CM

## 2023-01-02 ENCOUNTER — Inpatient Hospital Stay
Admission: RE | Admit: 2023-01-02 | Discharge: 2023-01-02 | Discharge status: Home / Self Care | Payer: HMO | Source: Ambulatory Visit | Attending: Family Medicine | Admitting: Family Medicine

## 2023-01-02 ENCOUNTER — Ambulatory Visit: Payer: HMO | Admitting: Podiatry

## 2023-01-02 DIAGNOSIS — M545 Low back pain, unspecified: Secondary | ICD-10-CM

## 2023-01-02 DIAGNOSIS — R270 Ataxia, unspecified: Secondary | ICD-10-CM

## 2023-01-02 DIAGNOSIS — R29898 Other symptoms and signs involving the musculoskeletal system: Secondary | ICD-10-CM

## 2023-01-02 MED ORDER — GADOPICLENOL 0.5 MMOL/ML IV SOLN
7.5000 mL | Freq: Once | INTRAVENOUS | Status: AC | PRN
  Administered 2023-01-02: 7.5 mL via INTRAVENOUS

## 2023-01-02 NOTE — Telephone Encounter (Signed)
Prescription refill request for Pradaxa received.  Indication:afib Last office visit:7/24 Weight:81.1  kg Age:82 Scr:1.36  3/24 CrCl:46.38  ml/min  Prescription refilled

## 2023-01-09 ENCOUNTER — Ambulatory Visit (INDEPENDENT_AMBULATORY_CARE_PROVIDER_SITE_OTHER): Payer: HMO | Admitting: Podiatry

## 2023-01-09 ENCOUNTER — Encounter: Payer: Self-pay | Admitting: Podiatry

## 2023-01-09 DIAGNOSIS — D689 Coagulation defect, unspecified: Secondary | ICD-10-CM

## 2023-01-09 DIAGNOSIS — M79674 Pain in right toe(s): Secondary | ICD-10-CM | POA: Diagnosis not present

## 2023-01-09 DIAGNOSIS — B351 Tinea unguium: Secondary | ICD-10-CM

## 2023-01-09 DIAGNOSIS — M79675 Pain in left toe(s): Secondary | ICD-10-CM | POA: Diagnosis not present

## 2023-01-09 NOTE — Progress Notes (Signed)
This patient returns to my office for at risk foot care.  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  Patient is taking pradaxa.This patient is unable to cut nails himself since the patient cannot reach his nails.These nails are painful walking and wearing shoes.  This patient presents for at risk foot care today.  General Appearance  Alert, conversant and in no acute stress.  Vascular  Dorsalis pedis  are weakly  palpable  bilaterally. Posterior tibial pulses are absent  Bilateral. Capillary return is within normal limits  Bilateral.  Cold feet.  Bilaterally. Absent hair noted.  Neurologic  Senn-Weinstein monofilament wire test within normal limits  bilaterally. Muscle power within normal limits bilaterally.  Nails Thick disfigured discolored nails with subungual debris  from hallux to fifth toes bilaterally. No evidence of bacterial infection or drainage bilaterally.  Orthopedic  No limitations of motion  feet .  No crepitus or effusions noted.  No bony pathology or digital deformities noted.  Hammer toes 2,3 right foot.  Skin  normotropic skin with no porokeratosis noted bilaterally.  No signs of infections or ulcers noted.  Asymptomatic diffuse callus right forefoot.   Onychomycosis  Pain in right toes  Pain in left toes  Consent was obtained for treatment procedures.   Mechanical debridement of nails 1-5  bilaterally performed with a nail nipper.  Filed with dremel without incident.    Return office visit   3 months.                  Told patient to return for periodic foot care and evaluation due to potential at risk complications.   Gardiner Barefoot DPM

## 2023-02-01 ENCOUNTER — Encounter (HOSPITAL_BASED_OUTPATIENT_CLINIC_OR_DEPARTMENT_OTHER): Payer: Self-pay

## 2023-02-01 ENCOUNTER — Emergency Department (HOSPITAL_BASED_OUTPATIENT_CLINIC_OR_DEPARTMENT_OTHER): Payer: HMO

## 2023-02-01 ENCOUNTER — Other Ambulatory Visit: Payer: Self-pay

## 2023-02-01 ENCOUNTER — Other Ambulatory Visit (HOSPITAL_BASED_OUTPATIENT_CLINIC_OR_DEPARTMENT_OTHER): Payer: Self-pay

## 2023-02-01 ENCOUNTER — Inpatient Hospital Stay (HOSPITAL_BASED_OUTPATIENT_CLINIC_OR_DEPARTMENT_OTHER)
Admission: EM | Admit: 2023-02-01 | Discharge: 2023-02-03 | DRG: 884 | Disposition: A | Discharge status: Home Health | Payer: HMO | Attending: Internal Medicine | Admitting: Internal Medicine

## 2023-02-01 DIAGNOSIS — Z7902 Long term (current) use of antithrombotics/antiplatelets: Secondary | ICD-10-CM

## 2023-02-01 DIAGNOSIS — M6282 Rhabdomyolysis: Secondary | ICD-10-CM | POA: Diagnosis present

## 2023-02-01 DIAGNOSIS — R5381 Other malaise: Secondary | ICD-10-CM | POA: Diagnosis present

## 2023-02-01 DIAGNOSIS — E785 Hyperlipidemia, unspecified: Secondary | ICD-10-CM | POA: Diagnosis present

## 2023-02-01 DIAGNOSIS — R269 Unspecified abnormalities of gait and mobility: Principal | ICD-10-CM

## 2023-02-01 DIAGNOSIS — I129 Hypertensive chronic kidney disease with stage 1 through stage 4 chronic kidney disease, or unspecified chronic kidney disease: Secondary | ICD-10-CM | POA: Diagnosis present

## 2023-02-01 DIAGNOSIS — Z87891 Personal history of nicotine dependence: Secondary | ICD-10-CM

## 2023-02-01 DIAGNOSIS — F0390 Unspecified dementia without behavioral disturbance: Principal | ICD-10-CM | POA: Diagnosis present

## 2023-02-01 DIAGNOSIS — Z1152 Encounter for screening for COVID-19: Secondary | ICD-10-CM

## 2023-02-01 DIAGNOSIS — Z882 Allergy status to sulfonamides status: Secondary | ICD-10-CM

## 2023-02-01 DIAGNOSIS — R531 Weakness: Principal | ICD-10-CM

## 2023-02-01 DIAGNOSIS — W07XXXA Fall from chair, initial encounter: Secondary | ICD-10-CM | POA: Diagnosis present

## 2023-02-01 DIAGNOSIS — K219 Gastro-esophageal reflux disease without esophagitis: Secondary | ICD-10-CM | POA: Diagnosis present

## 2023-02-01 DIAGNOSIS — Z79899 Other long term (current) drug therapy: Secondary | ICD-10-CM

## 2023-02-01 DIAGNOSIS — N400 Enlarged prostate without lower urinary tract symptoms: Secondary | ICD-10-CM | POA: Diagnosis present

## 2023-02-01 DIAGNOSIS — C9000 Multiple myeloma not having achieved remission: Secondary | ICD-10-CM | POA: Diagnosis present

## 2023-02-01 DIAGNOSIS — I445 Left posterior fascicular block: Secondary | ICD-10-CM | POA: Diagnosis present

## 2023-02-01 DIAGNOSIS — J4489 Other specified chronic obstructive pulmonary disease: Secondary | ICD-10-CM | POA: Diagnosis present

## 2023-02-01 DIAGNOSIS — G4733 Obstructive sleep apnea (adult) (pediatric): Secondary | ICD-10-CM | POA: Diagnosis present

## 2023-02-01 DIAGNOSIS — E876 Hypokalemia: Secondary | ICD-10-CM | POA: Diagnosis present

## 2023-02-01 DIAGNOSIS — I4819 Other persistent atrial fibrillation: Secondary | ICD-10-CM | POA: Diagnosis present

## 2023-02-01 DIAGNOSIS — Z888 Allergy status to other drugs, medicaments and biological substances status: Secondary | ICD-10-CM

## 2023-02-01 DIAGNOSIS — Y92009 Unspecified place in unspecified non-institutional (private) residence as the place of occurrence of the external cause: Secondary | ICD-10-CM

## 2023-02-01 DIAGNOSIS — Z85828 Personal history of other malignant neoplasm of skin: Secondary | ICD-10-CM

## 2023-02-01 DIAGNOSIS — N182 Chronic kidney disease, stage 2 (mild): Secondary | ICD-10-CM | POA: Diagnosis present

## 2023-02-01 DIAGNOSIS — M4802 Spinal stenosis, cervical region: Secondary | ICD-10-CM | POA: Diagnosis present

## 2023-02-01 LAB — COMPREHENSIVE METABOLIC PANEL
ALT: 19 U/L (ref 0–44)
AST: 26 U/L (ref 15–41)
Albumin: 3.8 g/dL (ref 3.5–5.0)
Alkaline Phosphatase: 45 U/L (ref 38–126)
Anion gap: 11 (ref 5–15)
BUN: 15 mg/dL (ref 8–23)
CO2: 28 mmol/L (ref 22–32)
Calcium: 9.2 mg/dL (ref 8.9–10.3)
Chloride: 102 mmol/L (ref 98–111)
Creatinine, Ser: 0.91 mg/dL (ref 0.61–1.24)
GFR, Estimated: >60 mL/min (ref >60)
Glucose, Bld: 100 mg/dL — ABNORMAL HIGH (ref 70–99)
Potassium: 3.4 mmol/L — ABNORMAL LOW (ref 3.5–5.1)
Sodium: 141 mmol/L (ref 135–145)
Total Bilirubin: 1.4 mg/dL — ABNORMAL HIGH (ref <1.2)
Total Protein: 5.9 g/dL — ABNORMAL LOW (ref 6.5–8.1)

## 2023-02-01 LAB — CK: Total CK: 706 U/L — ABNORMAL HIGH (ref 49–397)

## 2023-02-01 LAB — URINALYSIS, W/ REFLEX TO CULTURE (INFECTION SUSPECTED)
Bacteria, UA: NONE SEEN
Bilirubin Urine: NEGATIVE
Glucose, UA: NEGATIVE mg/dL
Ketones, ur: 40 mg/dL — AB
Leukocytes,Ua: NEGATIVE
Nitrite: NEGATIVE
Specific Gravity, Urine: 1.018 (ref 1.005–1.030)
pH: 5.5 (ref 5.0–8.0)

## 2023-02-01 LAB — CBC WITH DIFFERENTIAL/PLATELET
Abs Immature Granulocytes: 0.03 10*3/uL (ref 0.00–0.07)
Basophils Absolute: 0 10*3/uL (ref 0.0–0.1)
Basophils Relative: 0 %
Eosinophils Absolute: 0 10*3/uL (ref 0.0–0.5)
Eosinophils Relative: 0 %
HCT: 37.9 % — ABNORMAL LOW (ref 39.0–52.0)
Hemoglobin: 13 g/dL (ref 13.0–17.0)
Immature Granulocytes: 1 %
Lymphocytes Relative: 5 %
Lymphs Abs: 0.2 10*3/uL — ABNORMAL LOW (ref 0.7–4.0)
MCH: 31.3 pg (ref 26.0–34.0)
MCHC: 34.3 g/dL (ref 30.0–36.0)
MCV: 91.1 fL (ref 80.0–100.0)
Monocytes Absolute: 0.3 10*3/uL (ref 0.1–1.0)
Monocytes Relative: 7 %
Neutro Abs: 3.9 10*3/uL (ref 1.7–7.7)
Neutrophils Relative %: 87 %
Platelets: 104 10*3/uL — ABNORMAL LOW (ref 150–400)
RBC: 4.16 MIL/uL — ABNORMAL LOW (ref 4.22–5.81)
RDW: 15.7 % — ABNORMAL HIGH (ref 11.5–15.5)
WBC: 4.5 10*3/uL (ref 4.0–10.5)
nRBC: 0 % (ref 0.0–0.2)

## 2023-02-01 LAB — RESP PANEL BY RT-PCR (RSV, FLU A&B, COVID)  RVPGX2
Influenza A by PCR: NEGATIVE
Influenza B by PCR: NEGATIVE
Resp Syncytial Virus by PCR: NEGATIVE
SARS Coronavirus 2 by RT PCR: NEGATIVE

## 2023-02-01 MED ORDER — SODIUM CHLORIDE 0.9 % IV SOLN: INTRAVENOUS | Status: AC

## 2023-02-01 MED ORDER — ONDANSETRON HCL 4 MG PO TABS: 4.0000 mg | ORAL_TABLET | Freq: Four times a day (QID) | ORAL | Status: DC | PRN

## 2023-02-01 MED ORDER — ACETAMINOPHEN 650 MG RE SUPP: 650.0000 mg | Freq: Four times a day (QID) | RECTAL | Status: DC | PRN

## 2023-02-01 MED ORDER — ONDANSETRON HCL 4 MG/2ML IJ SOLN: 4.0000 mg | Freq: Four times a day (QID) | INTRAMUSCULAR | Status: DC | PRN

## 2023-02-01 MED ORDER — LEVALBUTEROL HCL 0.63 MG/3ML IN NEBU
0.6300 mg | INHALATION_SOLUTION | Freq: Four times a day (QID) | RESPIRATORY_TRACT | Status: DC
  Administered 2023-02-02: 0.63 mg via RESPIRATORY_TRACT
  Filled 2023-02-01: qty 3

## 2023-02-01 MED ORDER — ACETAMINOPHEN 325 MG PO TABS: 650.0000 mg | ORAL_TABLET | Freq: Four times a day (QID) | ORAL | Status: DC | PRN

## 2023-02-01 NOTE — ED Notes (Signed)
Ambulated pt from bathroom back to room x2 assist.. Pt has unsteady gait and balance issues. Pt tended to lean forward without assistance while ambulating and would sway. Patient is at high risk of falling while ambulating without assistance.

## 2023-02-01 NOTE — ED Triage Notes (Signed)
Pt arrived via [facility] from home. Per EMS pt has baseline confusion and lives with family who reports pt "slid out of wheelchair last night and then slid out of bed without falling/trauma," further reporting pt was on floor since last night at approx 2000 until EMS was called this morning. Pt denies hitting head, denies LOC, does take blood thinner. Currently undergoing cancer tx. PMH afib.

## 2023-02-01 NOTE — ED Provider Notes (Signed)
[place] EMERGENCY DEPARTMENT AT [facility] Provider Note  CSN: [id number] Arrival date & time: 02/01/23 1107  Chief Complaint(s) Weakness  HPI [name]. is a 82 y.o. male who is here today after the patient slid on the floor last night and the patient's life partner was unable to help lift him.  Patient has a history of dementia, multiple myeloma, currently under treatment.  Does take blood thinners.  Like minor bedside states that the patient has had increased weakness in his legs over the last several months.  He has outpatient studies pending.   Past Medical History Past Medical History:  Diagnosis Date   Allergy    Arrhythmia    Atrial fibrillation (HCC)    Barrett esophagus    Barrett's esophagus    Blood transfusion without reported diagnosis    BPH (benign prostatic hyperplasia)    Cervical stenosis of spine    Chronic obstructive asthma    Dementia (HCC)    Difficult intubation 13 or 14 years ago, at    airway was up near nostrils with intubation per patient with bowel surgery   ED (erectile dysfunction)    GERD (gastroesophageal reflux disease)    Hammer toe of right foot    Hiatal hernia    Hyperlipidemia    Hypertension    OSA (obstructive sleep apnea) 08/05/10 [facility]   AHI 24   Paroxysmal atrial fibrillation (HCC)    Hospitalized on 05/27/10   Renal cell carcinoma 2009   right renal mass, followed by Dr. 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   Pain due to onychomycosis of toenails of both feet 08/15/2018    Dermatitis associated with moisture from stool incontinence 08/14/2018   Gastric polyp    Incontinence of feces 08/19/2017   AAA (abdominal aortic aneurysm) (HCC) 06/30/2017   Anxiety 06/30/2017   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   Anosmia 02/25/2016   Chronic pansinusitis 02/25/2016   Impacted cerumen of both ears 02/25/2016   Noise effect on both inner ears 02/25/2016   Presbycusis of both ears 02/25/2016   Duodenal nodule    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 Authorizing Provider  CARTIA XT 120 MG 24 hr capsule Take 120 mg by mouth every morning. 12/31/22  Yes [provider]  chlorthalidone (HYGROTON) 25 MG tablet Take 25 mg by mouth as needed (As needed per wife).   Yes [provider]  fluorouracil (EFUDEX) 5 % cream Apply 1 Application topically 2 (two) times daily. *sparingly* 12/13/22  Yes [provider]  lenalidomide (REVLIMID) 10 MG capsule Take 10 mg by  mouth daily. *take 1 tab daily on days 1-21 of each 28 day cycle* 01/19/23 02/22/23 Yes [provider]  Multiple Vitamins-Minerals (MULTIVITAMIN WITH MINERALS) tablet Take 1 tablet by mouth daily.   Yes [provider]  acyclovir (ZOVIRAX) 400 MG tablet Take 400 mg by mouth 2 (two) times daily. 01/05/20   [provider]  alfuzosin (UROXATRAL) 10 MG 24 hr tablet Take 1 tablet by mouth every evening. 08/11/17   [provider]  AMBULATORY NON FORMULARY MEDICATION CPAP at night    [provider]   calcium citrate-vitamin D (CITRACAL+D) 315-200 MG-UNIT tablet Take 1 tablet by mouth 2 (two) times daily. 03/12/20   [provider]  DARATUMUMAB IV Inject into the vein.    [provider]  diltiazem (TIAZAC) 120 MG 24 hr capsule TAKE ONE CAPSULE IN THE MORNING 11/17/20   [provider]  donepezil (ARICEPT) 10 MG tablet Take 10 mg by mouth daily.  06/27/19   [provider]  dutasteride (AVODART) 0.5 MG capsule Take 0.5 mg by mouth every evening. 02/26/15   [provider]  lenalidomide (REVLIMID) 10 MG capsule Take 10 mg by mouth as directed. Take 1 capsule (10 mg) by mouth on Days 1-21 of each 28 Day Cycle 03/03/20   [provider]  Melatonin 1 MG CAPS Take by mouth.    [provider]  memantine (NAMENDA) 10 MG tablet Take 10 mg by mouth 2 (two) times daily. 12/06/19   [provider]  metoprolol succinate (TOPROL-XL) 25 MG 24 hr tablet Take 25 mg by mouth every morning.    [provider]  montelukast (SINGULAIR) 10 MG tablet TAKE 1 TABLET(10 MG) BY MOUTH AT BEDTIME Patient taking differently: Take 10 mg by mouth in the morning. 01/29/18   Coralyn Helling, MD  omeprazole (PRILOSEC) 20 MG capsule TAKE ONE CAPSULE BY MOUTH EVERY DAY 11/29/16   Meryl Dare, MD  potassium chloride (MICRO-K) 10 MEQ CR capsule Take 20 mEq by mouth daily. 01/27/22   [provider]  PRADAXA 150 MG CAPS capsule TAKE ONE CAPSULE BY MOUTH TWICE DAILY 01/02/23   [place], Peter M, MD  pravastatin (PRAVACHOL) 20 MG tablet Take 20 mg daily by mouth.    [provider]  tadalafil (CIALIS) 5 MG tablet Take 1 tablet by mouth daily. 09/16/20   [provider]  zoledronic acid (ZOMETA) 4 MG/5ML injection Inject 4 mg into the vein once.    [provider]                                                                                                                                    Past Surgical History Past Surgical  History:  Procedure Laterality Date   BASAL CELL CARCINOMA EXCISION     BIOPSY  12/18/2017   Procedure: BIOPSY;  Surgeon: Meryl Dare, MD;  Location: [facility];  Service: Endoscopy;;  ESOPHAGOGASTRODUODENOSCOPY (EGD) WITH PROPOFOL N/A 08/26/2014   Procedure: ESOPHAGOGASTRODUODENOSCOPY (EGD) WITH PROPOFOL;  Surgeon: Meryl Dare, MD;  Location: [facility];  Service: Endoscopy;  Laterality: N/A;   ESOPHAGOGASTRODUODENOSCOPY (EGD) WITH PROPOFOL N/A 12/18/2017   Procedure: ESOPHAGOGASTRODUODENOSCOPY (EGD) WITH PROPOFOL;  Surgeon: Meryl Dare, MD;  Location: [facility];  Service: Endoscopy;  Laterality: N/A;   EXPLORATORY LAPAROTOMY W/ BOWEL RESECTION  12/1999   segmental sbr of a hemangioma, repair of incarcerated umbilical hernia   exploratory laparotomy, segmental small bowel resection of a hemangioma, repair of incarcerated umbelical hernia  12/1999   EYE SURGERY     cataracts done   renal cancer surgery with RF Ablation     TONSILLECTOMY     Family History Family History  Problem Relation Age of Onset   Stroke Mother    Diabetes Mother    Diabetes Father    Obesity Brother    Diabetes Brother    Kidney cancer Paternal Aunt    Asthma Paternal Grandmother    Heart attack Paternal Grandfather    Colon cancer Neg Hx    Esophageal cancer Neg Hx    Rectal cancer Neg Hx    Stomach cancer Neg Hx    Dementia Neg Hx    Alzheimer's disease Neg Hx     Social History Social History   Tobacco Use   Smoking status: Former    Types: Cigars    Quit date: 02/29/1980    Years since quitting: 42.9    Passive exposure: Past   Smokeless tobacco: Never   Tobacco comments:    quit when he was 82 years old  Vaping Use   Vaping status: Never Used  Substance Use Topics   Alcohol use: No   Drug use: No   Allergies Tamsulosin and Proair hfa [albuterol]  Review of Systems Review of Systems  Physical Exam Vital Signs  I have reviewed the triage vital signs BP 128/68    Pulse (!) 59   Temp 97.9 F (36.6 C) (Oral)   Resp 14   SpO2 97%   Physical Exam Vitals reviewed.  Constitutional:      General: He is not in acute distress.    Appearance: Normal appearance.  HENT:     Head: Normocephalic and atraumatic.     Nose: Nose normal.  Eyes:     Pupils: Pupils are equal, round, and reactive to light.  Cardiovascular:     Rate and Rhythm: Normal rate.  Pulmonary:     Effort: Pulmonary effort is normal.  Abdominal:     General: Abdomen is flat. There is no distension.     Tenderness: There is no abdominal tenderness.  Musculoskeletal:        General: No swelling.     Cervical back: Normal range of motion. No rigidity.  Skin:    Coloration: Skin is not jaundiced.  Neurological:     General: No focal deficit present.     Mental Status: He is alert. Mental status is at baseline.     Cranial Nerves: No cranial nerve deficit.     Motor: No weakness.     ED Results and Treatments Labs (all labs ordered are listed, but only abnormal results are displayed) Labs Reviewed  COMPREHENSIVE METABOLIC PANEL - Abnormal; Notable for the following components:      Result Value   Potassium 3.4 (*)    Glucose, Bld 100 (*)    Total Protein 5.9 (*)    Total Bilirubin  1.4 (*)    All other components within normal limits  CBC WITH DIFFERENTIAL/PLATELET - Abnormal; Notable for the following components:   RBC 4.16 (*)    HCT 37.9 (*)    RDW 15.7 (*)    Platelets 104 (*)    Lymphs Abs 0.2 (*)    All other components within normal limits  URINALYSIS, W/ REFLEX TO CULTURE (INFECTION SUSPECTED) - Abnormal; Notable for the following components:   Hgb urine dipstick SMALL (*)    Ketones, ur 40 (*)    Protein, ur TRACE (*)    All other components within normal limits  CK - Abnormal; Notable for the following components:   Total CK 706 (*)    All other components within normal limits  RESP PANEL BY RT-PCR (RSV, FLU A&B, COVID)  RVPGX2                                                                                                                           Radiology DG Chest Portable 1 View  Result Date: 02/01/2023 CLINICAL DATA:  Cough. EXAM: PORTABLE CHEST 1 VIEW COMPARISON:  Chest radiograph dated February 17, 2022. FINDINGS: The cardiac silhouette is mildly enlarged, but stable. Mediastinal contours are within normal limits. Similar chronic blunting of the left costophrenic angle. A trace left effusion can not be excluded. Mild left basilar atelectasis. No focal consolidation or pneumothorax. No acute osseous abnormality. IMPRESSION: Blunting of the left costophrenic angle is likely chronic, although, a trace left pleural effusion can not be excluded. Otherwise, no acute cardiopulmonary findings. Electronically Signed   By: Hart Robinsons M.D.   On: 02/01/2023 13:03   CT Head Wo Contrast  Result Date: 02/01/2023 CLINICAL DATA:  Slid out of wheelchair last night and slid out of bed, head and neck trauma, on blood thinners EXAM: CT HEAD WITHOUT CONTRAST CT CERVICAL SPINE WITHOUT CONTRAST TECHNIQUE: Multidetector CT imaging of the head and cervical spine was performed following the standard protocol without intravenous contrast. Multiplanar CT image reconstructions of the cervical spine were also generated. RADIATION DOSE REDUCTION: This exam was performed according to the departmental dose-optimization program which includes automated exposure control, adjustment of the mA and/or kV according to patient size and/or use of iterative reconstruction technique. COMPARISON:  No prior CT head available, correlation is made with 04/08/2021 MRI head; no prior CT cervical spine available, correlation is made with 08/21/2020 MRI cervical spine FINDINGS: CT HEAD FINDINGS Brain: No evidence of acute infarct, hemorrhage, mass, mass effect, or midline shift. No hydrocephalus or extra-axial fluid collection. Age related cerebral atrophy. Periventricular white matter changes,  likely the sequela of chronic small vessel ischemic disease. Vascular: No hyperdense vessel. Atherosclerotic calcifications in the intracranial carotid and vertebral arteries. Skull: Negative for fracture or focal lesion. Sinuses/Orbits: Mucosal thickening in the ethmoid air cells and right maxillary sinus. Status post bilateral lens replacements. Other: The mastoid air cells are well aerated. CT CERVICAL SPINE FINDINGS Alignment: No traumatic listhesis.  Skull base and vertebrae: No acute fracture or suspicious osseous lesion. Soft tissues and spinal canal: No prevertebral fluid or swelling. No visible canal hematoma. Disc levels: Degenerative changes in the cervical spine.No high-grade spinal canal stenosis. Upper chest: No focal pulmonary opacity or pleural effusion. IMPRESSION: 1. No acute intracranial process. 2. No acute fracture or traumatic listhesis in the cervical spine. Electronically Signed   By: Wiliam Ke M.D.   On: 02/01/2023 12:34   CT Cervical Spine Wo Contrast  Result Date: 02/01/2023 CLINICAL DATA:  Slid out of wheelchair last night and slid out of bed, head and neck trauma, on blood thinners EXAM: CT HEAD WITHOUT CONTRAST CT CERVICAL SPINE WITHOUT CONTRAST TECHNIQUE: Multidetector CT imaging of the head and cervical spine was performed following the standard protocol without intravenous contrast. Multiplanar CT image reconstructions of the cervical spine were also generated. RADIATION DOSE REDUCTION: This exam was performed according to the departmental dose-optimization program which includes automated exposure control, adjustment of the mA and/or kV according to patient size and/or use of iterative reconstruction technique. COMPARISON:  No prior CT head available, correlation is made with 04/08/2021 MRI head; no prior CT cervical spine available, correlation is made with 08/21/2020 MRI cervical spine FINDINGS: CT HEAD FINDINGS Brain: No evidence of acute infarct, hemorrhage, mass, mass  effect, or midline shift. No hydrocephalus or extra-axial fluid collection. Age related cerebral atrophy. Periventricular white matter changes, likely the sequela of chronic small vessel ischemic disease. Vascular: No hyperdense vessel. Atherosclerotic calcifications in the intracranial carotid and vertebral arteries. Skull: Negative for fracture or focal lesion. Sinuses/Orbits: Mucosal thickening in the ethmoid air cells and right maxillary sinus. Status post bilateral lens replacements. Other: The mastoid air cells are well aerated. CT CERVICAL SPINE FINDINGS Alignment: No traumatic listhesis. Skull base and vertebrae: No acute fracture or suspicious osseous lesion. Soft tissues and spinal canal: No prevertebral fluid or swelling. No visible canal hematoma. Disc levels: Degenerative changes in the cervical spine.No high-grade spinal canal stenosis. Upper chest: No focal pulmonary opacity or pleural effusion. IMPRESSION: 1. No acute intracranial process. 2. No acute fracture or traumatic listhesis in the cervical spine. Electronically Signed   By: Wiliam Ke M.D.   On: 02/01/2023 12:34    Pertinent labs & imaging results that were available during my care of the patient were reviewed by me and considered in my medical decision making (see MDM for details).  Medications Ordered in ED Medications - No data to display                                                                                                                                   Procedures Procedures  (including critical care time)  Medical Decision Making / ED Course   This patient presents to the ED for concern of a fall yesterday, weakness, this involves an extensive number of treatment options, and is a complaint that carries with it  a high risk of complications and morbidity.  The differential diagnosis includes dementia, delirium, traumatic injury, rhabdomyolysis, less likely CVA, underlying infection.  MDM: Patient  currently at baseline.  He is able to recognize his life partner, with whom he has been together for 45 years.  He has not been able to sleep last few days.  With fall, will perform workup for traumatic injury.  He has no pain or tenderness in his abdomen, chest back, or lower extremities.  Is able to lift his legs without any difficulty.  Sounds though he has been having some worsening difficulty with gait.  Check blood work to look for underlying signs of infection, delirium.  Reassessment 2:05 PM-my independent review the patient's head CT shows no intracranial hemorrhage.  Chest x-ray normal.  CT cervical spine negative.  I reviewed the patient's blood work, no leukocytosis, no anemia.  Kidney function normal.  Urinalysis not consistent with infection.  CK only mildly elevated.  Remainder of labs not consistent with rhabdomyolysis.  At this time, do not have a clear cause for the patient's symptoms.  Discussed this with the patient's life partner at bedside.  She feels comfortable with having patient return home with her, and following up with the PCP.   Additional history obtained: -Additional history obtained from family at bedside -External records from outside source obtained and reviewed including: Chart review including previous notes, labs, imaging, consultation notes   Lab Tests: -I ordered, reviewed, and interpreted labs.   The pertinent results include:   Labs Reviewed  COMPREHENSIVE METABOLIC PANEL - Abnormal; Notable for the following components:      Result Value   Potassium 3.4 (*)    Glucose, Bld 100 (*)    Total Protein 5.9 (*)    Total Bilirubin 1.4 (*)    All other components within normal limits  CBC WITH DIFFERENTIAL/PLATELET - Abnormal; Notable for the following components:   RBC 4.16 (*)    HCT 37.9 (*)    RDW 15.7 (*)    Platelets 104 (*)    Lymphs Abs 0.2 (*)    All other components within normal limits  URINALYSIS, W/ REFLEX TO CULTURE (INFECTION  SUSPECTED) - Abnormal; Notable for the following components:   Hgb urine dipstick SMALL (*)    Ketones, ur 40 (*)    Protein, ur TRACE (*)    All other components within normal limits  CK - Abnormal; Notable for the following components:   Total CK 706 (*)    All other components within normal limits  RESP PANEL BY RT-PCR (RSV, FLU A&B, COVID)  RVPGX2      EKG atrial fibrillation with normal rate  EKG Interpretation Date/Time:  Wednesday February 01 2023 11:13:29 EST Ventricular Rate:  68 PR Interval:    QRS Duration:  94 QT Interval:  460 QTC Calculation: 490 R Axis:   131  Text Interpretation: Atrial fibrillation Left posterior fascicular block Abnormal R-wave progression, late transition Nonspecific T abnormalities, anterior leads Borderline prolonged QT interval Confirmed by Anders Simmonds [phone]) on 02/01/2023 2:08:28 PM         Imaging Studies ordered: I ordered imaging studies including CT head, chest x-ray, CT cervical spine I independently visualized and interpreted imaging. I agree with the radiologist interpretation   Medicines ordered and prescription drug management: No orders of the defined types were placed in this encounter.   -I have reviewed the patients home medicines and have made adjustments as needed  Cardiac Monitoring: The  patient was maintained on a cardiac monitor.  I personally viewed and interpreted the cardiac monitored which showed an underlying rhythm of: Atrial fibrillation  Social Determinants of Health:  Factors impacting patients care include: Patient with dementia   Reevaluation: After the interventions noted above, I reevaluated the patient and found that they have :improved  Co morbidities that complicate the patient evaluation  Past Medical History:  Diagnosis Date   Allergy    Arrhythmia    Atrial fibrillation (HCC)    Barrett esophagus    Barrett's esophagus    Blood transfusion without reported diagnosis    BPH  (benign prostatic hyperplasia)    Cervical stenosis of spine    Chronic obstructive asthma    Dementia (HCC)    Difficult intubation 13 or 14 years ago, at [facility] long   airway was up near nostrils with intubation per patient with bowel surgery   ED (erectile dysfunction)    GERD (gastroesophageal reflux disease)    Hammer toe of right foot    Hiatal hernia    Hyperlipidemia    Hypertension    OSA (obstructive sleep apnea) 08/05/10 [facility]   AHI 24   Paroxysmal atrial fibrillation (HCC)    Hospitalized on 05/27/10   Renal cell carcinoma 2009   right renal mass, followed by Dr. Teodora Medici   Sleep apnea       Dispostion: I considered admission for this patient, however with the patient's reassuring workup and his safe disposition believe that outpatient management is better for this patient.     Final Clinical Impression(s) / ED Diagnoses Final diagnoses:  Weakness     @PCDICTATION @    Anders Simmonds T, DO 02/01/23 1416

## 2023-02-01 NOTE — ED Notes (Signed)
Called [facility] for transport.  

## 2023-02-01 NOTE — Discharge Instructions (Addendum)
While [name] was in the emergency department, he had CT scans done of his head and neck, chest x-ray, and blood work, as well as having his urine checked.  These test were all normal.  There are many viruses going around that could be contributing to his symptoms.  Make sure that he is taking all of his medications at home, and have him follow-up with his primary care doctor this week.

## 2023-02-01 NOTE — ED Notes (Signed)
Wife states that pt slid into floor last night from chair and bed; wife could not get him back up off of floor since. States he didn't feel well yesterday, has had increased weakness, increased confusion. Has hx of mult. Myleoma and is currently under tx. Did not have morning medications today.

## 2023-02-01 NOTE — ED Provider Notes (Signed)
Patient discharged per prior provider, I was called into the room to evaluate the patient at per family request.  Reassessment: On my evaluation he is in no acute distress, vital signs stable.  Reviewed all labs and imaging.  No acute pathology.  Daughter arrived and is worried that he has a new weakness with acute worsening over the last 48 hours.  No evidence of stroke on CT.  No focal neurologic deficits.  Nursing to trial ambulation and reassess.   "Hi, yes we took him to the bathroom and ambulated him back to the room. He was a x2 assist, unsteady gait, and kept leaning forward if we let go, he would definitely fall on his own; his balance is off "  Reassessment: I was called back to patient's bedside as he is having failure to ambulate.  Daughter saying this is all acute over the past 48 hours.  I do believe patient would warrant MRI.  Consulted medicine given the acute decompensation of his status over such a short amount of time and they agree with need for observation status workup and patient was admitted without further intervention in the emergency room.   Glyn Ade, MD 02/01/23 2303

## 2023-02-01 NOTE — H&P (Addendum)
History and Physical    [name]. RUE:[id number] DOB: Dec 11, 1940 DOA: 02/01/2023  PCP: Mosetta Putt, MD  Patient coming from: [facility]  I have personally briefly reviewed patient's old medical records in [facility]  Chief Complaint: fall global weakness   HPI: [name]. is a 82 y.o. male with medical history significant for atrial fibrillation on pradaxa, BPH, Dementia, cervical spinal stenosis,GERD,COPD,HTN, HLD, OSA, RCC in 2009 treated with RFA  ,pancreatic cyst consistent with IPMN ,multiple myeloma, currently under treatment. who presents to ED s/p fall last evening where he was unable to get up. Per patient his slipped of bed and was unable to get up so he slept on the floor.  Per patient he has had progressive weakness for sometime now time but has never had an episode where he was too weak to stand. He notes no fever/ chills/ n/v/d/ although patient does have a faint cough. He has no other currently complaints.   ED Course:  Vitals afeb, bp 128/66, hr 58, rr 13 sat 99% JYN:WGNFAO fibrillation ,left  posterior fascicular block  Labs Na 141, K3.4 cr 0.91 T-bili 1.4  CK 706 Wbc 4.5, hgb 13,plt 104 UA: neg  CTH: NAD Cxr: Blunting of the left costophrenic angle is likely chronic, although, a trace left pleural effusion can not be excluded. Otherwise, no acute cardiopulmonary findings.   Review of Systems: As per HPI otherwise 10 point review of systems negative.   Past Medical History:  Diagnosis Date   Allergy    Arrhythmia    Atrial fibrillation (HCC)    Barrett esophagus    Barrett's esophagus    Blood transfusion without reported diagnosis    BPH (benign prostatic hyperplasia)    Cervical stenosis of spine    Chronic obstructive asthma    Dementia (HCC)    Difficult intubation 13 or 14 years ago, at [facility] long   airway was up near nostrils with intubation per patient with bowel surgery   ED (erectile dysfunction)    GERD (gastroesophageal  reflux disease)    Hammer toe of right foot    Hiatal hernia    Hyperlipidemia    Hypertension    OSA (obstructive sleep apnea) 08/05/10 [facility]   AHI 24   Paroxysmal atrial fibrillation (HCC)    Hospitalized on 05/27/10   Renal cell carcinoma 2009   right renal mass, followed by Dr. Teodora Medici   Sleep apnea     Past Surgical History:  Procedure Laterality Date   BASAL CELL CARCINOMA EXCISION     BIOPSY  12/18/2017   Procedure: BIOPSY;  Surgeon: Meryl Dare, MD;  Location: [facility] ENDOSCOPY;  Service: Endoscopy;;   ESOPHAGOGASTRODUODENOSCOPY (EGD) WITH PROPOFOL N/A 08/26/2014   Procedure: ESOPHAGOGASTRODUODENOSCOPY (EGD) WITH PROPOFOL;  Surgeon: Meryl Dare, MD;  Location: [facility] ENDOSCOPY;  Service: Endoscopy;  Laterality: N/A;   ESOPHAGOGASTRODUODENOSCOPY (EGD) WITH PROPOFOL N/A 12/18/2017   Procedure: ESOPHAGOGASTRODUODENOSCOPY (EGD) WITH PROPOFOL;  Surgeon: Meryl Dare, MD;  Location: [facility] ENDOS[facility];  Service: Endoscopy;  Laterality: N/A;   EXPLORATORY LAPAROTOMY W/ BOWEL RESECTION  12/1999   segmental sbr of a hemangioma, repair of incarcerated umbilical hernia   exploratory laparotomy, segmental small bowel resection of a hemangioma, repair of incarcerated umbelical hernia  12/1999   EYE SURGERY     cataracts done   renal cancer surgery with RF Ablation     TONSILLECTOMY       reports that he quit smoking about 42 years ago.  His smoking use included cigars. He has been exposed to tobacco smoke. He has never used smokeless tobacco. He reports that he does not drink alcohol and does not use drugs.  Allergies  Allergen Reactions   Tamsulosin Other (See Comments)    dizziness   Proair Hfa [Albuterol] Palpitations    Only use in emergency due to has heart problems    Family History  Problem Relation Age of Onset   Stroke Mother    Diabetes Mother    Diabetes Father    Obesity Brother    Diabetes Brother    Kidney cancer Paternal Aunt    Asthma Paternal Grandmother     Heart attack Paternal Grandfather    Colon cancer Neg Hx    Esophageal cancer Neg Hx    Rectal cancer Neg Hx    Stomach cancer Neg Hx    Dementia Neg Hx    Alzheimer's disease Neg Hx     Prior to Admission medications   Medication Sig Start Date End Date Taking? Authorizing Provider  CARTIA XT 120 MG 24 hr capsule Take 120 mg by mouth every morning. 12/31/22  Yes [provider]  chlorthalidone (HYGROTON) 25 MG tablet Take 25 mg by mouth as needed (As needed per wife).   Yes [provider]  fluorouracil (EFUDEX) 5 % cream Apply 1 Application topically 2 (two) times daily. *sparingly* 12/13/22  Yes [provider]  lenalidomide (REVLIMID) 10 MG capsule Take 10 mg by mouth daily. *take 1 tab daily on days 1-21 of each 28 day cycle* 01/19/23 02/22/23 Yes [provider]  Multiple Vitamins-Minerals (MULTIVITAMIN WITH MINERALS) tablet Take 1 tablet by mouth daily.   Yes [provider]  acyclovir (ZOVIRAX) 400 MG tablet Take 400 mg by mouth 2 (two) times daily. 01/05/20   [provider]  alfuzosin (UROXATRAL) 10 MG 24 hr tablet Take 1 tablet by mouth every evening. 08/11/17   [provider]  AMBULATORY NON FORMULARY MEDICATION CPAP at night    [provider]  calcium citrate-vitamin D (CITRACAL+D) 315-200 MG-UNIT tablet Take 1 tablet by mouth 2 (two) times daily. 03/12/20   [provider]  DARATUMUMAB IV Inject into the vein.    [provider]  diltiazem (TIAZAC) 120 MG 24 hr capsule TAKE ONE CAPSULE IN THE MORNING 11/17/20   [provider]  donepezil (ARICEPT) 10 MG tablet Take 10 mg by mouth daily.  06/27/19   [provider]  dutasteride (AVODART) 0.5 MG capsule Take 0.5 mg by mouth every evening. 02/26/15   [provider]  lenalidomide (REVLIMID) 10 MG capsule Take 10 mg by mouth as directed. Take 1 capsule (10 mg) by mouth on Days 1-21 of each 28 Day Cycle 03/03/20   [provider]  Melatonin 1 MG CAPS Take by mouth.    [provider]  memantine (NAMENDA) 10 MG tablet Take 10 mg by mouth 2 (two) times daily. 12/06/19   [provider]  metoprolol succinate (TOPROL-XL) 25 MG 24 hr tablet Take 25 mg by mouth every morning.    [provider]  montelukast (SINGULAIR) 10 MG tablet TAKE 1 TABLET(10 MG) BY MOUTH AT BEDTIME Patient taking differently: Take 10 mg by mouth in the morning. 01/29/18   Coralyn Helling, MD  omeprazole (PRILOSEC) 20 MG capsule TAKE ONE CAPSULE BY MOUTH EVERY DAY 11/29/16   Meryl Dare, MD  potassium chloride (MICRO-K) 10 MEQ CR capsule Take 20 mEq by mouth  daily. 01/27/22   [provider]  PRADAXA 150 MG CAPS capsule TAKE ONE CAPSULE BY MOUTH TWICE DAILY 01/02/23   [place], Peter M, MD  pravastatin (PRAVACHOL) 20 MG tablet Take 20 mg daily by mouth.    [provider]  tadalafil (CIALIS) 5 MG tablet Take 1 tablet by mouth daily. 09/16/20   [provider]  zoledronic acid (ZOMETA) 4 MG/5ML injection Inject 4 mg into the vein once.    [provider]    Physical Exam: Vitals:   02/01/23 2130 02/01/23 2145 02/01/23 2157 02/01/23 2248  BP: 131/67 104/61  112/63  Pulse: 94 67  94  Resp: 18 20  18   Temp:   98.6 F (37 C) 98.1 F (36.7 C)  TempSrc:    Oral  SpO2: 97% 100%  100%    Constitutional: NAD, calm, comfortable Vitals:   02/01/23 2130 02/01/23 2145 02/01/23 2157 02/01/23 2248  BP: 131/67 104/61  112/63  Pulse: 94 67  94  Resp: 18 20  18   Temp:   98.6 F (37 C) 98.1 F (36.7 C)  TempSrc:    Oral  SpO2: 97% 100%  100%   Eyes: PERRL, lids and conjunctivae normal ENMT: Mucous membranes are moist. Posterior pharynx clear of any exudate or lesions.Normal dentition.  Neck: normal, supple, no masses, no thyromegaly Respiratory: clear to auscultation bilaterally, no wheezing, no crackles. Normal respiratory effort. No accessory muscle use.  Cardiovascular: Regular  rate and rhythm, no murmurs / rubs / gallops. No extremity edema. 2+ pedal pulses. No carotid bruits.  Abdomen: no tenderness, no masses palpated. No hepatosplenomegaly. Bowel sounds positive.  Musculoskeletal: no clubbing / cyanosis. No joint deformity upper and lower extremities. Good ROM, no contractures. Normal muscle tone.  Skin: no rashes, lesions, ulcers. No induration Neurologic: CN 2-12 grossly intact. Sensation intact, Strength 3-4/5 in all  lower extremities.  Psychiatric: Normal judgment and insight. Alert and oriented x 3. Normal mood.    Labs on Admission: I have personally reviewed following labs and imaging studies  CBC: Recent Labs  Lab 02/01/23 1137  WBC 4.5  NEUTROABS 3.9  HGB 13.0  HCT 37.9*  MCV 91.1  PLT 104*   Basic Metabolic Panel: Recent Labs  Lab 02/01/23 1137  NA 141  K 3.4*  CL 102  CO2 28  GLUCOSE 100*  BUN 15  CREATININE 0.91  CALCIUM 9.2   GFR: CrCl cannot be calculated (Unknown ideal weight.). Liver Function Tests: Recent Labs  Lab 02/01/23 1137  AST 26  ALT 19  ALKPHOS 45  BILITOT 1.4*  PROT 5.9*  ALBUMIN 3.8   No results for input(s): "LIPASE", "AMYLASE" in the last 168 hours. No results for input(s): "AMMONIA" in the last 168 hours. Coagulation Profile: No results for input(s): "INR", "PROTIME" in the last 168 hours. Cardiac Enzymes: Recent Labs  Lab 02/01/23 1137  CKTOTAL 706*   BNP (last 3 results) No results for input(s): "PROBNP" in the last 8760 hours. HbA1C: No results for input(s): "HGBA1C" in the last 72 hours. CBG: No results for input(s): "GLUCAP" in the last 168 hours. Lipid Profile: No results for input(s): "CHOL", "HDL", "LDLCALC", "TRIG", "CHOLHDL", "LDLDIRECT" in the last 72 hours. Thyroid Function Tests: No results for input(s): "TSH", "T4TOTAL", "FREET4", "T3FREE", "THYROIDAB" in the last 72 hours. Anemia Panel: No results for input(s): "VITAMINB12", "FOLATE", "FERRITIN", "TIBC", "IRON",  "RETICCTPCT" in the last 72 hours. Urine analysis:    Component Value Date/Time   COLORURINE YELLOW 02/01/2023 1316  APPEARANCEUR CLEAR 02/01/2023 1316   LABSPEC 1.018 02/01/2023 1316   PHURINE 5.5 02/01/2023 1316   GLUCOSEU NEGATIVE 02/01/2023 1316   HGBUR SMALL (A) 02/01/2023 1316   BILIRUBINUR NEGATIVE 02/01/2023 1316   BILIRUBINUR neg 11/19/2012 0831   KETONESUR 40 (A) 02/01/2023 1316   PROTEINUR TRACE (A) 02/01/2023 1316   UROBILINOGEN 0.2 11/19/2012 0831   NITRITE NEGATIVE 02/01/2023 1316   LEUKOCYTESUR NEGATIVE 02/01/2023 1316    Radiological Exams on Admission: DG Chest Portable 1 View  Result Date: 02/01/2023 CLINICAL DATA:  Cough. EXAM: PORTABLE CHEST 1 VIEW COMPARISON:  Chest radiograph dated February 17, 2022. FINDINGS: The cardiac silhouette is mildly enlarged, but stable. Mediastinal contours are within normal limits. Similar chronic blunting of the left costophrenic angle. A trace left effusion can not be excluded. Mild left basilar atelectasis. No focal consolidation or pneumothorax. No acute osseous abnormality. IMPRESSION: Blunting of the left costophrenic angle is likely chronic, although, a trace left pleural effusion can not be excluded. Otherwise, no acute cardiopulmonary findings. Electronically Signed   By: Hart Robinsons M.D.   On: 02/01/2023 13:03   CT Head Wo Contrast  Result Date: 02/01/2023 CLINICAL DATA:  Slid out of wheelchair last night and slid out of bed, head and neck trauma, on blood thinners EXAM: CT HEAD WITHOUT CONTRAST CT CERVICAL SPINE WITHOUT CONTRAST TECHNIQUE: Multidetector CT imaging of the head and cervical spine was performed following the standard protocol without intravenous contrast. Multiplanar CT image reconstructions of the cervical spine were also generated. RADIATION DOSE REDUCTION: This exam was performed according to the departmental dose-optimization program which includes automated exposure control, adjustment of the mA and/or kV  according to patient size and/or use of iterative reconstruction technique. COMPARISON:  No prior CT head available, correlation is made with 04/08/2021 MRI head; no prior CT cervical spine available, correlation is made with 08/21/2020 MRI cervical spine FINDINGS: CT HEAD FINDINGS Brain: No evidence of acute infarct, hemorrhage, mass, mass effect, or midline shift. No hydrocephalus or extra-axial fluid collection. Age related cerebral atrophy. Periventricular white matter changes, likely the sequela of chronic small vessel ischemic disease. Vascular: No hyperdense vessel. Atherosclerotic calcifications in the intracranial carotid and vertebral arteries. Skull: Negative for fracture or focal lesion. Sinuses/Orbits: Mucosal thickening in the ethmoid air cells and right maxillary sinus. Status post bilateral lens replacements. Other: The mastoid air cells are well aerated. CT CERVICAL SPINE FINDINGS Alignment: No traumatic listhesis. Skull base and vertebrae: No acute fracture or suspicious osseous lesion. Soft tissues and spinal canal: No prevertebral fluid or swelling. No visible canal hematoma. Disc levels: Degenerative changes in the cervical spine.No high-grade spinal canal stenosis. Upper chest: No focal pulmonary opacity or pleural effusion. IMPRESSION: 1. No acute intracranial process. 2. No acute fracture or traumatic listhesis in the cervical spine. Electronically Signed   By: Wiliam Ke M.D.   On: 02/01/2023 12:34   CT Cervical Spine Wo Contrast  Result Date: 02/01/2023 CLINICAL DATA:  Slid out of wheelchair last night and slid out of bed, head and neck trauma, on blood thinners EXAM: CT HEAD WITHOUT CONTRAST CT CERVICAL SPINE WITHOUT CONTRAST TECHNIQUE: Multidetector CT imaging of the head and cervical spine was performed following the standard protocol without intravenous contrast. Multiplanar CT image reconstructions of the cervical spine were also generated. RADIATION DOSE REDUCTION: This exam  was performed according to the departmental dose-optimization program which includes automated exposure control, adjustment of the mA and/or kV according to patient size and/or use of iterative reconstruction technique.  COMPARISON:  No prior CT head available, correlation is made with 04/08/2021 MRI head; no prior CT cervical spine available, correlation is made with 08/21/2020 MRI cervical spine FINDINGS: CT HEAD FINDINGS Brain: No evidence of acute infarct, hemorrhage, mass, mass effect, or midline shift. No hydrocephalus or extra-axial fluid collection. Age related cerebral atrophy. Periventricular white matter changes, likely the sequela of chronic small vessel ischemic disease. Vascular: No hyperdense vessel. Atherosclerotic calcifications in the intracranial carotid and vertebral arteries. Skull: Negative for fracture or focal lesion. Sinuses/Orbits: Mucosal thickening in the ethmoid air cells and right maxillary sinus. Status post bilateral lens replacements. Other: The mastoid air cells are well aerated. CT CERVICAL SPINE FINDINGS Alignment: No traumatic listhesis. Skull base and vertebrae: No acute fracture or suspicious osseous lesion. Soft tissues and spinal canal: No prevertebral fluid or swelling. No visible canal hematoma. Disc levels: Degenerative changes in the cervical spine.No high-grade spinal canal stenosis. Upper chest: No focal pulmonary opacity or pleural effusion. IMPRESSION: 1. No acute intracranial process. 2. No acute fracture or traumatic listhesis in the cervical spine. Electronically Signed   By: Wiliam Ke M.D.   On: 02/01/2023 12:34    EKG: Independently reviewed. See above  Assessment/Plan  Fall Progressive Chronic debility  Mild Rhabdomyolysis for immobilized state  -CTH negative, MRI head negative  -presumed progressive weakness in setting of Multiple Myeloma/ IPMN on oral chemo -supportive care  -PT/OT - ivfs for elevated CK s/p prolonged imoblization -can  consider neurology consult for further evaluation if patient does improve with supportive care and PT/OT insetting of oral chemo use  and negative neuro imaging.    Atrial fibrillation  -continue Diltiazem 120 mg QD,and Pradaxa 150 mg BID.   BPH -resume uroxatral   Dementia -aricept,nameda    Cervical spinal stenosis -no active issue   GERD -ppi  COPD -prn nebs  HTN -resume metoprolol and Diltiazem    HLD -continue pravastatin    OSA, -cpap as able   RCC -s/p RFA  Multiple myelom  -currently under treatment.  DVT prophylaxis: on praxada Code Status: full/ as discussed per patient wishes in event of cardiac arrest  Family Communication: none at bedside Disposition Plan: patient  expected to be admitted greater than 2 midnights  Consults called: n/a Admission status: med tele   Lurline Del MD [facility]   If 7PM-7AM, please contact night-coverage [email or web address] Password [facility]  02/01/2023, 11:46 PM

## 2023-02-01 NOTE — ED Notes (Signed)
Pt currently laying flat for orthostatic VS. 

## 2023-02-01 NOTE — H&P (Incomplete)
History and Physical    [name] ZOX:[id number] DOB: 07/05/1940 DOA: 02/01/2023  PCP: Mosetta Putt, MD  Patient coming from: ***  I have personally briefly reviewed patient's old medical records in [facility]  Chief Complaint: ***  HPI: Almalik Tritto. is a 82 y.o. male with medical history significant of    ED Course: ***  Review of Systems: As per HPI otherwise 10 point review of systems negative.   Past Medical History:  Diagnosis Date  . Allergy   . Arrhythmia   . Atrial fibrillation (HCC)   . Barrett esophagus   . Barrett's esophagus   . Blood transfusion without reported diagnosis   . BPH (benign prostatic hyperplasia)   . Cervical stenosis of spine   . Chronic obstructive asthma   . Dementia (HCC)   . Difficult intubation 13 or 14 years ago, at [facility] long   airway was up near nostrils with intubation per patient with bowel surgery  . ED (erectile dysfunction)   . GERD (gastroesophageal reflux disease)   . Hammer toe of right foot   . Hiatal hernia   . Hyperlipidemia   . Hypertension   . OSA (obstructive sleep apnea) 08/05/10 [facility]   AHI 24  . Paroxysmal atrial fibrillation (HCC)    Hospitalized on 05/27/10  . Renal cell carcinoma 2009   right renal mass, followed by Dr. Teodora Medici  . Sleep apnea     Past Surgical History:  Procedure Laterality Date  . BASAL CELL CARCINOMA EXCISION    . BIOPSY  12/18/2017   Procedure: BIOPSY;  Surgeon: Meryl Dare, MD;  Location: [facility];  Service: Endoscopy;;  . ESOPHAGOGASTRODUODENOSCOPY (EGD) WITH PROPOFOL N/A 08/26/2014   Procedure: ESOPHAGOGASTRODUODENOSCOPY (EGD) WITH PROPOFOL;  Surgeon: Meryl Dare, MD;  Location: [facility];  Service: Endoscopy;  Laterality: N/A;  . ESOPHAGOGASTRODUODENOSCOPY (EGD) WITH PROPOFOL N/A 12/18/2017   Procedure: ESOPHAGOGASTRODUODENOSCOPY (EGD) WITH PROPOFOL;  Surgeon: Meryl Dare, MD;  Location: [facility];  Service: Endoscopy;  Laterality: N/A;   . EXPLORATORY LAPAROTOMY W/ BOWEL RESECTION  12/1999   segmental sbr of a hemangioma, repair of incarcerated umbilical hernia  . exploratory laparotomy, segmental small bowel resection of a hemangioma, repair of incarcerated umbelical hernia  12/1999  . EYE SURGERY     cataracts done  . renal cancer surgery with RF Ablation    . TONSILLECTOMY       reports that he quit smoking about 42 years ago. His smoking use included cigars. He has been exposed to tobacco smoke. He has never used smokeless tobacco. He reports that he does not drink alcohol and does not use drugs.  Allergies  Allergen Reactions  . Tamsulosin Other (See Comments)    dizziness  . Proair Hfa [Albuterol] Palpitations    Only use in emergency due to has heart problems    Family History  Problem Relation Age of Onset  . Stroke Mother   . Diabetes Mother   . Diabetes Father   . Obesity Brother   . Diabetes Brother   . Kidney cancer Paternal Aunt   . Asthma Paternal Grandmother   . Heart attack Paternal Grandfather   . Colon cancer Neg Hx   . Esophageal cancer Neg Hx   . Rectal cancer Neg Hx   . Stomach cancer Neg Hx   . Dementia Neg Hx   . Alzheimer's disease Neg Hx    *** Prior to Admission medications   Medication Sig  Start Date End Date Taking? Authorizing Provider  CARTIA XT 120 MG 24 hr capsule Take 120 mg by mouth every morning. 12/31/22  Yes [provider]  chlorthalidone (HYGROTON) 25 MG tablet Take 25 mg by mouth as needed (As needed per wife).   Yes [provider]  fluorouracil (EFUDEX) 5 % cream Apply 1 Application topically 2 (two) times daily. *sparingly* 12/13/22  Yes [provider]  lenalidomide (REVLIMID) 10 MG capsule Take 10 mg by mouth daily. *take 1 tab daily on days 1-21 of each 28 day cycle* 01/19/23 02/22/23 Yes [provider]  Multiple Vitamins-Minerals (MULTIVITAMIN WITH MINERALS) tablet Take 1 tablet by mouth daily.   Yes [provider]  acyclovir (ZOVIRAX) 400 MG tablet Take 400 mg by mouth 2 (two) times daily. 01/05/20   [provider]  alfuzosin (UROXATRAL) 10 MG 24 hr tablet Take 1 tablet by mouth every evening. 08/11/17   [provider]  AMBULATORY NON FORMULARY MEDICATION CPAP at night    [provider]  calcium citrate-vitamin D (CITRACAL+D) 315-200 MG-UNIT tablet Take 1 tablet by mouth 2 (two) times daily. 03/12/20   [provider]  DARATUMUMAB IV Inject into the vein.    [provider]  diltiazem (TIAZAC) 120 MG 24 hr capsule TAKE ONE CAPSULE IN THE MORNING 11/17/20   [provider]  donepezil (ARICEPT) 10 MG tablet Take 10 mg by mouth daily.  06/27/19   [provider]  dutasteride (AVODART) 0.5 MG capsule Take 0.5 mg by mouth every evening. 02/26/15   [provider]  lenalidomide (REVLIMID) 10 MG capsule Take 10 mg by mouth as directed. Take 1 capsule (10 mg) by mouth on Days 1-21 of each 28 Day Cycle 03/03/20   [provider]  Melatonin 1 MG CAPS Take by mouth.    [provider]  memantine (NAMENDA) 10 MG tablet Take 10 mg by mouth 2 (two) times daily. 12/06/19   [provider]  metoprolol succinate (TOPROL-XL) 25 MG 24 hr tablet Take 25 mg by mouth every morning.    [provider]  montelukast (SINGULAIR) 10 MG tablet TAKE 1 TABLET(10 MG) BY MOUTH AT BEDTIME Patient taking differently: Take 10 mg by mouth in the morning. 01/29/18   Coralyn Helling, MD  omeprazole (PRILOSEC) 20 MG capsule TAKE ONE CAPSULE BY MOUTH EVERY DAY 11/29/16   Meryl Dare, MD  potassium chloride (MICRO-K) 10 MEQ CR capsule Take 20 mEq by mouth daily. 01/27/22   [provider]  PRADAXA 150 MG CAPS capsule TAKE ONE CAPSULE BY MOUTH TWICE DAILY 01/02/23   [place], Peter M, MD  pravastatin (PRAVACHOL) 20 MG tablet Take 20 mg daily by mouth.    [provider]  tadalafil (CIALIS) 5 MG tablet Take 1 tablet by mouth  daily. 09/16/20   [provider]  zoledronic acid (ZOMETA) 4 MG/5ML injection Inject 4 mg into the vein once.    [provider]    Physical Exam: Vitals:   02/01/23 2130 02/01/23 2145 02/01/23 2157 02/01/23 2248  BP: 131/67 104/61  112/63  Pulse: 94 67  94  Resp: 18 20  18   Temp:   98.6 F (37 C) 98.1 F (36.7 C)  TempSrc:    Oral  SpO2: 97% 100%  100%    Constitutional: NAD, calm, comfortable Vitals:   02/01/23 2130 02/01/23 2145 02/01/23 2157 02/01/23 2248  BP: 131/67 104/61  112/63  Pulse: 94 67  94  Resp: 18 20  18   Temp:   98.6 F (37 C) 98.1 F (36.7 C)  TempSrc:    Oral  SpO2: 97% 100%  100%   Eyes: PERRL, lids and conjunctivae normal ENMT: Mucous membranes are moist. Posterior pharynx clear of any exudate or lesions.Normal dentition.  Neck: normal, supple, no masses, no thyromegaly Respiratory: clear to auscultation bilaterally, no wheezing, no crackles. Normal respiratory effort. No accessory muscle use.  Cardiovascular: Regular rate and rhythm, no murmurs / rubs / gallops. No extremity edema. 2+ pedal pulses. No carotid bruits.  Abdomen: no tenderness, no masses palpated. No hepatosplenomegaly. Bowel sounds positive.  Musculoskeletal: no clubbing / cyanosis. No joint deformity upper and lower extremities. Good ROM, no contractures. Normal muscle tone.  Skin: no rashes, lesions, ulcers. No induration Neurologic: CN 2-12 grossly intact. Sensation intact, DTR normal. Strength 5/5 in all 4.  Psychiatric: Normal judgment and insight. Alert and oriented x 3. Normal mood.    Labs on Admission: I have personally reviewed following labs and imaging studies  CBC: Recent Labs  Lab 02/01/23 1137  WBC 4.5  NEUTROABS 3.9  HGB 13.0  HCT 37.9*  MCV 91.1  PLT 104*   Basic Metabolic Panel: Recent Labs  Lab 02/01/23 1137  NA 141  K 3.4*  CL 102  CO2 28  GLUCOSE 100*  BUN 15  CREATININE 0.91  CALCIUM 9.2   GFR: CrCl cannot be calculated  (Unknown ideal weight.). Liver Function Tests: Recent Labs  Lab 02/01/23 1137  AST 26  ALT 19  ALKPHOS 45  BILITOT 1.4*  PROT 5.9*  ALBUMIN 3.8   No results for input(s): "LIPASE", "AMYLASE" in the last 168 hours. No results for input(s): "AMMONIA" in the last 168 hours. Coagulation Profile: No results for input(s): "INR", "PROTIME" in the last 168 hours. Cardiac Enzymes: Recent Labs  Lab 02/01/23 1137  CKTOTAL 706*   BNP (last 3 results) No results for input(s): "PROBNP" in the last 8760 hours. HbA1C: No results for input(s): "HGBA1C" in the last 72 hours. CBG: No results for input(s): "GLUCAP" in the last 168 hours. Lipid Profile: No results for input(s): "CHOL", "HDL", "LDLCALC", "TRIG", "CHOLHDL", "LDLDIRECT" in the last 72 hours. Thyroid Function Tests: No results for input(s): "TSH", "T4TOTAL", "FREET4", "T3FREE", "THYROIDAB" in the last 72 hours. Anemia Panel: No results for input(s): "VITAMINB12", "FOLATE", "FERRITIN", "TIBC", "IRON", "RETICCTPCT" in the last 72 hours. Urine analysis:    Component Value Date/Time   COLORURINE YELLOW 02/01/2023 1316   APPEARANCEUR CLEAR 02/01/2023 1316   LABSPEC 1.018 02/01/2023 1316   PHURINE 5.5 02/01/2023 1316   GLUCOSEU NEGATIVE 02/01/2023 1316   HGBUR SMALL (A) 02/01/2023 1316   BILIRUBINUR NEGATIVE 02/01/2023 1316   BILIRUBINUR neg 11/19/2012 0831   KETONESUR 40 (A) 02/01/2023 1316   PROTEINUR TRACE (A) 02/01/2023 1316   UROBILINOGEN 0.2 11/19/2012 0831   NITRITE NEGATIVE 02/01/2023 1316   LEUKOCYTESUR NEGATIVE 02/01/2023 1316    Radiological Exams on Admission: DG Chest Portable 1 View  Result Date: 02/01/2023 CLINICAL DATA:  Cough. EXAM: PORTABLE CHEST 1 VIEW COMPARISON:  Chest radiograph dated February 17, 2022. FINDINGS: The cardiac silhouette is mildly enlarged, but stable. Mediastinal contours are within normal limits. Similar chronic blunting of the left costophrenic angle. A trace left effusion can not be  excluded. Mild left basilar atelectasis. No focal consolidation or pneumothorax. No acute osseous abnormality. IMPRESSION: Blunting of the left costophrenic angle is likely chronic, although, a trace left pleural effusion can not be excluded.  Otherwise, no acute cardiopulmonary findings. Electronically Signed   By: Hart Robinsons M.D.   On: 02/01/2023 13:03   CT Head Wo Contrast  Result Date: 02/01/2023 CLINICAL DATA:  Slid out of wheelchair last night and slid out of bed, head and neck trauma, on blood thinners EXAM: CT HEAD WITHOUT CONTRAST CT CERVICAL SPINE WITHOUT CONTRAST TECHNIQUE: Multidetector CT imaging of the head and cervical spine was performed following the standard protocol without intravenous contrast. Multiplanar CT image reconstructions of the cervical spine were also generated. RADIATION DOSE REDUCTION: This exam was performed according to the departmental dose-optimization program which includes automated exposure control, adjustment of the mA and/or kV according to patient size and/or use of iterative reconstruction technique. COMPARISON:  No prior CT head available, correlation is made with 04/08/2021 MRI head; no prior CT cervical spine available, correlation is made with 08/21/2020 MRI cervical spine FINDINGS: CT HEAD FINDINGS Brain: No evidence of acute infarct, hemorrhage, mass, mass effect, or midline shift. No hydrocephalus or extra-axial fluid collection. Age related cerebral atrophy. Periventricular white matter changes, likely the sequela of chronic small vessel ischemic disease. Vascular: No hyperdense vessel. Atherosclerotic calcifications in the intracranial carotid and vertebral arteries. Skull: Negative for fracture or focal lesion. Sinuses/Orbits: Mucosal thickening in the ethmoid air cells and right maxillary sinus. Status post bilateral lens replacements. Other: The mastoid air cells are well aerated. CT CERVICAL SPINE FINDINGS Alignment: No traumatic listhesis. Skull base  and vertebrae: No acute fracture or suspicious osseous lesion. Soft tissues and spinal canal: No prevertebral fluid or swelling. No visible canal hematoma. Disc levels: Degenerative changes in the cervical spine.No high-grade spinal canal stenosis. Upper chest: No focal pulmonary opacity or pleural effusion. IMPRESSION: 1. No acute intracranial process. 2. No acute fracture or traumatic listhesis in the cervical spine. Electronically Signed   By: Wiliam Ke M.D.   On: 02/01/2023 12:34   CT Cervical Spine Wo Contrast  Result Date: 02/01/2023 CLINICAL DATA:  Slid out of wheelchair last night and slid out of bed, head and neck trauma, on blood thinners EXAM: CT HEAD WITHOUT CONTRAST CT CERVICAL SPINE WITHOUT CONTRAST TECHNIQUE: Multidetector CT imaging of the head and cervical spine was performed following the standard protocol without intravenous contrast. Multiplanar CT image reconstructions of the cervical spine were also generated. RADIATION DOSE REDUCTION: This exam was performed according to the departmental dose-optimization program which includes automated exposure control, adjustment of the mA and/or kV according to patient size and/or use of iterative reconstruction technique. COMPARISON:  No prior CT head available, correlation is made with 04/08/2021 MRI head; no prior CT cervical spine available, correlation is made with 08/21/2020 MRI cervical spine FINDINGS: CT HEAD FINDINGS Brain: No evidence of acute infarct, hemorrhage, mass, mass effect, or midline shift. No hydrocephalus or extra-axial fluid collection. Age related cerebral atrophy. Periventricular white matter changes, likely the sequela of chronic small vessel ischemic disease. Vascular: No hyperdense vessel. Atherosclerotic calcifications in the intracranial carotid and vertebral arteries. Skull: Negative for fracture or focal lesion. Sinuses/Orbits: Mucosal thickening in the ethmoid air cells and right maxillary sinus. Status post  bilateral lens replacements. Other: The mastoid air cells are well aerated. CT CERVICAL SPINE FINDINGS Alignment: No traumatic listhesis. Skull base and vertebrae: No acute fracture or suspicious osseous lesion. Soft tissues and spinal canal: No prevertebral fluid or swelling. No visible canal hematoma. Disc levels: Degenerative changes in the cervical spine.No high-grade spinal canal stenosis. Upper chest: No focal pulmonary opacity or pleural effusion. IMPRESSION: 1. No  acute intracranial process. 2. No acute fracture or traumatic listhesis in the cervical spine. Electronically Signed   By: Wiliam Ke M.D.   On: 02/01/2023 12:34    EKG: Independently reviewed. ***  Assessment/Plan Principal Problem:   Gait disorder   ***  DVT prophylaxis: *** (Lovenox/Heparin/SCD's/anticoagulated/None (if comfort care) Code Status: *** (Full/Partial (specify details) Family Communication: *** (Specify name, relationship. Do not write "discussed with patient". Specify tel # if discussed over the phone) Disposition Plan: *** (specify when and where you expect patient to be discharged) Consults called: *** (with names) Admission status: *** (inpatient / obs / tele / medical floor / SDU)   Lurline Del MD [facility] Pager [phone]  If 7PM-7AM, please contact night-coverage [email or web address] Password [facility]  02/01/2023, 11:46 PM

## 2023-02-02 ENCOUNTER — Telehealth: Payer: Self-pay | Admitting: Neurology

## 2023-02-02 ENCOUNTER — Observation Stay (HOSPITAL_COMMUNITY): Payer: HMO

## 2023-02-02 DIAGNOSIS — R269 Unspecified abnormalities of gait and mobility: Secondary | ICD-10-CM | POA: Diagnosis present

## 2023-02-02 DIAGNOSIS — Z882 Allergy status to sulfonamides status: Secondary | ICD-10-CM | POA: Diagnosis not present

## 2023-02-02 DIAGNOSIS — Z888 Allergy status to other drugs, medicaments and biological substances status: Secondary | ICD-10-CM | POA: Diagnosis not present

## 2023-02-02 DIAGNOSIS — M6282 Rhabdomyolysis: Secondary | ICD-10-CM | POA: Diagnosis present

## 2023-02-02 DIAGNOSIS — I4819 Other persistent atrial fibrillation: Secondary | ICD-10-CM | POA: Diagnosis present

## 2023-02-02 DIAGNOSIS — Z79899 Other long term (current) drug therapy: Secondary | ICD-10-CM | POA: Diagnosis not present

## 2023-02-02 DIAGNOSIS — F0390 Unspecified dementia without behavioral disturbance: Secondary | ICD-10-CM | POA: Diagnosis present

## 2023-02-02 DIAGNOSIS — Y92009 Unspecified place in unspecified non-institutional (private) residence as the place of occurrence of the external cause: Secondary | ICD-10-CM | POA: Diagnosis not present

## 2023-02-02 DIAGNOSIS — Z87891 Personal history of nicotine dependence: Secondary | ICD-10-CM | POA: Diagnosis not present

## 2023-02-02 DIAGNOSIS — Z7902 Long term (current) use of antithrombotics/antiplatelets: Secondary | ICD-10-CM | POA: Diagnosis not present

## 2023-02-02 DIAGNOSIS — W07XXXA Fall from chair, initial encounter: Secondary | ICD-10-CM | POA: Diagnosis present

## 2023-02-02 DIAGNOSIS — N182 Chronic kidney disease, stage 2 (mild): Secondary | ICD-10-CM | POA: Diagnosis present

## 2023-02-02 DIAGNOSIS — K219 Gastro-esophageal reflux disease without esophagitis: Secondary | ICD-10-CM | POA: Diagnosis present

## 2023-02-02 DIAGNOSIS — M4802 Spinal stenosis, cervical region: Secondary | ICD-10-CM | POA: Diagnosis present

## 2023-02-02 DIAGNOSIS — N400 Enlarged prostate without lower urinary tract symptoms: Secondary | ICD-10-CM | POA: Diagnosis present

## 2023-02-02 DIAGNOSIS — J4489 Other specified chronic obstructive pulmonary disease: Secondary | ICD-10-CM | POA: Diagnosis present

## 2023-02-02 DIAGNOSIS — Z85828 Personal history of other malignant neoplasm of skin: Secondary | ICD-10-CM | POA: Diagnosis not present

## 2023-02-02 DIAGNOSIS — I445 Left posterior fascicular block: Secondary | ICD-10-CM | POA: Diagnosis present

## 2023-02-02 DIAGNOSIS — C9000 Multiple myeloma not having achieved remission: Secondary | ICD-10-CM | POA: Diagnosis present

## 2023-02-02 DIAGNOSIS — Z1152 Encounter for screening for COVID-19: Secondary | ICD-10-CM | POA: Diagnosis not present

## 2023-02-02 DIAGNOSIS — I129 Hypertensive chronic kidney disease with stage 1 through stage 4 chronic kidney disease, or unspecified chronic kidney disease: Secondary | ICD-10-CM | POA: Diagnosis present

## 2023-02-02 DIAGNOSIS — E876 Hypokalemia: Secondary | ICD-10-CM | POA: Diagnosis present

## 2023-02-02 DIAGNOSIS — G4733 Obstructive sleep apnea (adult) (pediatric): Secondary | ICD-10-CM | POA: Diagnosis present

## 2023-02-02 DIAGNOSIS — R5381 Other malaise: Secondary | ICD-10-CM | POA: Diagnosis present

## 2023-02-02 DIAGNOSIS — E785 Hyperlipidemia, unspecified: Secondary | ICD-10-CM | POA: Diagnosis present

## 2023-02-02 LAB — CBC WITH DIFFERENTIAL/PLATELET
Abs Immature Granulocytes: 0.02 10*3/uL (ref 0.00–0.07)
Basophils Absolute: 0 10*3/uL (ref 0.0–0.1)
Basophils Relative: 1 %
Eosinophils Absolute: 0.1 10*3/uL (ref 0.0–0.5)
Eosinophils Relative: 1 %
HCT: 35 % — ABNORMAL LOW (ref 39.0–52.0)
Hemoglobin: 11.8 g/dL — ABNORMAL LOW (ref 13.0–17.0)
Immature Granulocytes: 1 %
Lymphocytes Relative: 5 %
Lymphs Abs: 0.2 10*3/uL — ABNORMAL LOW (ref 0.7–4.0)
MCH: 30.2 pg (ref 26.0–34.0)
MCHC: 33.7 g/dL (ref 30.0–36.0)
MCV: 89.5 fL (ref 80.0–100.0)
Monocytes Absolute: 0.3 10*3/uL (ref 0.1–1.0)
Monocytes Relative: 7 %
Neutro Abs: 3.6 10*3/uL (ref 1.7–7.7)
Neutrophils Relative %: 85 %
Platelets: 111 10*3/uL — ABNORMAL LOW (ref 150–400)
RBC: 3.91 MIL/uL — ABNORMAL LOW (ref 4.22–5.81)
RDW: 15.5 % (ref 11.5–15.5)
WBC: 4.2 10*3/uL (ref 4.0–10.5)
nRBC: 0 % (ref 0.0–0.2)

## 2023-02-02 LAB — URINALYSIS, W/ REFLEX TO CULTURE (INFECTION SUSPECTED)
Bacteria, UA: NONE SEEN
Bilirubin Urine: NEGATIVE
Glucose, UA: NEGATIVE mg/dL
Hgb urine dipstick: NEGATIVE
Ketones, ur: 5 mg/dL — AB
Leukocytes,Ua: NEGATIVE
Nitrite: NEGATIVE
Protein, ur: NEGATIVE mg/dL
Specific Gravity, Urine: 1.02 (ref 1.005–1.030)
pH: 5 (ref 5.0–8.0)

## 2023-02-02 LAB — COMPREHENSIVE METABOLIC PANEL
ALT: 22 U/L (ref 0–44)
ALT: 22 U/L (ref 0–44)
AST: 25 U/L (ref 15–41)
AST: 27 U/L (ref 15–41)
Albumin: 2.8 g/dL — ABNORMAL LOW (ref 3.5–5.0)
Albumin: 3.1 g/dL — ABNORMAL LOW (ref 3.5–5.0)
Alkaline Phosphatase: 44 U/L (ref 38–126)
Alkaline Phosphatase: 47 U/L (ref 38–126)
Anion gap: 8 (ref 5–15)
Anion gap: 8 (ref 5–15)
BUN: 13 mg/dL (ref 8–23)
BUN: 17 mg/dL (ref 8–23)
CO2: 28 mmol/L (ref 22–32)
CO2: 28 mmol/L (ref 22–32)
Calcium: 8.2 mg/dL — ABNORMAL LOW (ref 8.9–10.3)
Calcium: 8.4 mg/dL — ABNORMAL LOW (ref 8.9–10.3)
Chloride: 103 mmol/L (ref 98–111)
Chloride: 103 mmol/L (ref 98–111)
Creatinine, Ser: 1.06 mg/dL (ref 0.61–1.24)
Creatinine, Ser: 1.22 mg/dL (ref 0.61–1.24)
GFR, Estimated: 58 mL/min — ABNORMAL LOW (ref >60)
GFR, Estimated: >60 mL/min (ref >60)
Glucose, Bld: 121 mg/dL — ABNORMAL HIGH (ref 70–99)
Glucose, Bld: 97 mg/dL (ref 70–99)
Potassium: 2.8 mmol/L — ABNORMAL LOW (ref 3.5–5.1)
Potassium: 2.8 mmol/L — ABNORMAL LOW (ref 3.5–5.1)
Sodium: 139 mmol/L (ref 135–145)
Sodium: 139 mmol/L (ref 135–145)
Total Bilirubin: 1.1 mg/dL (ref <1.2)
Total Bilirubin: 1.1 mg/dL (ref <1.2)
Total Protein: 5.1 g/dL — ABNORMAL LOW (ref 6.5–8.1)
Total Protein: 5.2 g/dL — ABNORMAL LOW (ref 6.5–8.1)

## 2023-02-02 LAB — CBC
HCT: 34.2 % — ABNORMAL LOW (ref 39.0–52.0)
Hemoglobin: 11.7 g/dL — ABNORMAL LOW (ref 13.0–17.0)
MCH: 30.7 pg (ref 26.0–34.0)
MCHC: 34.2 g/dL (ref 30.0–36.0)
MCV: 89.8 fL (ref 80.0–100.0)
Platelets: 101 10*3/uL — ABNORMAL LOW (ref 150–400)
RBC: 3.81 MIL/uL — ABNORMAL LOW (ref 4.22–5.81)
RDW: 15.4 % (ref 11.5–15.5)
WBC: 3.9 10*3/uL — ABNORMAL LOW (ref 4.0–10.5)
nRBC: 0 % (ref 0.0–0.2)

## 2023-02-02 LAB — RESPIRATORY PANEL BY PCR

## 2023-02-02 LAB — CK: Total CK: 306 U/L (ref 49–397)

## 2023-02-02 LAB — MAGNESIUM: Magnesium: 2.3 mg/dL (ref 1.7–2.4)

## 2023-02-02 LAB — TSH: TSH: 1.107 u[IU]/mL (ref 0.350–4.500)

## 2023-02-02 LAB — PHOSPHORUS: Phosphorus: 2.7 mg/dL (ref 2.5–4.6)

## 2023-02-02 MED ORDER — LEVALBUTEROL HCL 0.63 MG/3ML IN NEBU
0.6300 mg | INHALATION_SOLUTION | Freq: Four times a day (QID) | RESPIRATORY_TRACT | Status: DC | PRN
Start: 2023-02-02 — End: 2023-02-03

## 2023-02-02 MED ORDER — POTASSIUM CHLORIDE CRYS ER 20 MEQ PO TBCR
30.0000 meq | EXTENDED_RELEASE_TABLET | ORAL | Status: AC
  Administered 2023-02-02 (×3): 30 meq via ORAL
  Filled 2023-02-02 (×3): qty 1

## 2023-02-02 MED ORDER — DABIGATRAN ETEXILATE MESYLATE 150 MG PO CAPS
150.0000 mg | ORAL_CAPSULE | Freq: Two times a day (BID) | ORAL | Status: DC
  Administered 2023-02-02 – 2023-02-03 (×3): 150 mg via ORAL
  Filled 2023-02-02 (×4): qty 1

## 2023-02-02 MED ORDER — MONTELUKAST SODIUM 10 MG PO TABS
10.0000 mg | ORAL_TABLET | Freq: Every morning | ORAL | Status: DC
  Administered 2023-02-02 – 2023-02-03 (×2): 10 mg via ORAL
  Filled 2023-02-02 (×2): qty 1

## 2023-02-02 MED ORDER — DONEPEZIL HCL 10 MG PO TABS
10.0000 mg | ORAL_TABLET | Freq: Every day | ORAL | Status: DC
  Administered 2023-02-02 – 2023-02-03 (×2): 10 mg via ORAL
  Filled 2023-02-02 (×2): qty 1

## 2023-02-02 MED ORDER — PRAVASTATIN SODIUM 40 MG PO TABS
20.0000 mg | ORAL_TABLET | Freq: Every day | ORAL | Status: DC
  Administered 2023-02-02 – 2023-02-03 (×2): 20 mg via ORAL
  Filled 2023-02-02 (×2): qty 1

## 2023-02-02 MED ORDER — METOPROLOL SUCCINATE ER 25 MG PO TB24
25.0000 mg | ORAL_TABLET | Freq: Every morning | ORAL | Status: DC
  Administered 2023-02-02 – 2023-02-03 (×2): 25 mg via ORAL
  Filled 2023-02-02 (×2): qty 1

## 2023-02-02 MED ORDER — PANTOPRAZOLE SODIUM 40 MG PO TBEC
40.0000 mg | DELAYED_RELEASE_TABLET | Freq: Every day | ORAL | Status: DC
  Administered 2023-02-02 – 2023-02-03 (×2): 40 mg via ORAL
  Filled 2023-02-02 (×2): qty 1

## 2023-02-02 MED ORDER — MEMANTINE HCL 10 MG PO TABS
10.0000 mg | ORAL_TABLET | Freq: Two times a day (BID) | ORAL | Status: DC
  Administered 2023-02-02 – 2023-02-03 (×3): 10 mg via ORAL
  Filled 2023-02-02 (×3): qty 1

## 2023-02-02 MED ORDER — DILTIAZEM HCL ER COATED BEADS 120 MG PO CP24
120.0000 mg | ORAL_CAPSULE | Freq: Every morning | ORAL | Status: DC
  Administered 2023-02-02 – 2023-02-03 (×2): 120 mg via ORAL
  Filled 2023-02-02 (×2): qty 1

## 2023-02-02 NOTE — Progress Notes (Signed)
   02/02/23 2200  BiPAP/CPAP/SIPAP  BiPAP/CPAP/SIPAP Pt Type Adult  Reason BIPAP/CPAP not in use Non-compliant

## 2023-02-02 NOTE — TOC Initial Note (Signed)
Transition of Care (TOC) - Initial/Assessment Note    Patient Details  Name: [name]. MRN: [id number] Date of Birth: 12/22/1940  [facility]) CM/SW Contact:    Kermit Balo, RN Phone Number: 02/02/2023, 4:02 PM  Clinical Narrative:                  Pt is from home with his SO. They are together most of the time.  SO manages his medications and provides needed transportation. Therapies recommending home health. SO prefers CM discuss the choices with pts daughter, Summer tomorrow.  TOC following.  Expected Discharge Plan: Home w Home Health Services Barriers to Discharge: Continued Medical Work up   Patient Goals and CMS Choice   CMS Medicare.gov Compare Post Acute Care list provided to:: Patient Represenative (must comment) Choice offered to / list presented to : Spouse, Adult Children      Expected Discharge Plan and Services   Discharge Planning Services: CM Consult Post Acute Care Choice: Home Health Living arrangements for the past 2 months: Single Family Home                                      Prior Living Arrangements/Services Living arrangements for the past 2 months: Single Family Home Lives with:: Significant Other Patient language and need for interpreter reviewed:: Yes Do you feel safe going back to the place where you live?: Yes        Care giver support system in place?: Yes (comment) Current home services: DME (walker/ rollator/ shower seat/ transport chair) Criminal Activity/Legal Involvement Pertinent to Current Situation/Hospitalization: No - Comment as needed  Activities of Daily Living      Permission Sought/Granted                  Emotional Assessment Appearance:: Appears stated age     Orientation: : Oriented to Self, Oriented to Place   Psych Involvement: No (comment)  Admission diagnosis:  Gait disorder [R26.9] Weakness [R53.1] 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   Pain due to onychomycosis of toenails of both feet 08/15/2018   Dermatitis associated with moisture from stool incontinence 08/14/2018   Gastric polyp    Incontinence of feces 08/19/2017   AAA (abdominal aortic aneurysm) (HCC) 06/30/2017   Anxiety 06/30/2017   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   Anosmia 02/25/2016   Chronic pansinusitis 02/25/2016   Impacted cerumen of both ears 02/25/2016   Noise effect on both inner ears 02/25/2016   Presbycusis of both ears 02/25/2016   Duodenal nodule    Renal cell carcinoma of left kidney (HCC) 11/11/2013   BPH (benign prostatic hyperplasia) 11/07/2011   Hip pain 07/04/2011   Perianal itch 07/04/2011   ED (erectile dysfunction) of organic origin 03/07/2011   OSA on CPAP 08/08/2010   Chronic obstructive asthma (HCC) 07/14/2010   Rhinitis 07/14/2010   Cough 07/13/2010   Barrett's esophagus 03/11/2009   Atrial fibrillation (HCC) 07/29/2008   GERD 07/29/2008   PCP:  Mosetta Putt, MD Pharmacy:   [facility] [place], [place] - [address] [facility] [address] [place] [address] Phone: [phone] Fax: [phone]  [id number] [place], [place] - [address] AT [facility] [address] [place] [address] Phone: [phone] Fax: [phone]     Social Determinants of Health (SDOH) Social History: SDOH Screenings   Food Insecurity: No Food Insecurity (02/18/2022)  Housing: Low Risk  (02/18/2022)  Transportation Needs: No Transportation Needs  (02/18/2022)  Utilities: Not At Risk (02/18/2022)  Tobacco Use: Medium Risk (02/01/2023)   SDOH Interventions:     Readmission Risk Interventions     No data to display

## 2023-02-02 NOTE — Progress Notes (Signed)
PROGRESS NOTE    [name].  KGM:[id number] DOB: 1940/04/06 DOA: 02/01/2023 PCP: Mosetta Putt, MD    Brief Narrative:   [name]. is a 82 y.o. 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  Spouse assist with HPI.  Apparently patient has had some progressive weakness for some time but never had an episode where he was too weak to stand.  Patient denied fever/chills, no nausea/vomiting, no diarrhea.  Denies loss of consciousness or syncopal episode.  In the ED, temperature 97.9 F, HR 58, RR 13, BP 128/66, SpO2 99% on room air.  WBC 4.5, hemoglobin 13.0, platelet count 104.  Sodium 141, potassium 3.4, chloride 102, CO2 28, glucose 100, BUN 15, creatinine 0.91.  CK7 06.  COVID/RSV/influenza PCR negative.  Urinalysis with 40 ketones, small hemoglobin, trace protein, otherwise unrevealing.  Respiratory viral panel negative.  CT head/cervical spine with no acute intracranial process, no acute fracture or traumatic listhesis in the C-spine.  MRI brain without contrast with no acute intracranial abnormality, findings of chronic small vessel ischemia and volume loss.  Chest x-ray with blunting of the left costophrenic angle, trace left pleural effusion otherwise no acute cardiopulmonary disease findings.  TRH was consulted and patient was transferred to [facility] for further evaluation and management.  Assessment & Plan:   Progressive weakness/falls/deconditioning Mild rhabdomyolysis Patient presenting to ED after sliding out of his chair at home and unable to get off the floor, apparently laying  on the floor overnight.  Was noted to have an elevated CK level of 706 on admission.  TSH within normal limits.  Per review of EMR/outpatient notes he has had progressive dementia with gait abnormalities.  Initially concern for stroke, workup with CT head, MRI brain with no acute findings.  Suspect etiology of his progressive weakness likely related to his advanced age coupled with chemotherapy for his multiple myeloma and progressive dementia.  Spouse reports that patient has pending nerve conduction studies/EMG scheduled outpatient in March 2025. -- NS at 100 mL/h x 1 day -- PT/OT evaluation -- Supportive care -- Fall precautions -- Repeat BMP, CK level in a.m.  Hypokalemia Potassium 2.8, will replete. -- Repeat electrolytes in a.m. to include magnesium  Persistent atrial fibrillation Essential hypertension Follows with cardiology outpatient, Dr. [place]. -- Cardizem CD 120 mg p.o. daily -- Metoprolol 25 mg p.o. every morning -- Pradaxa 150 mg p.o. twice daily  CKD stage II -- Cr  0.91>1.06>1.22 -- BMP in am  Hyperlipidemia -- Pravastatin 20 mg p.o. daily  COPD -- Xopenex neb every 6 hours as needed wheezing/shortness of breath  Hx dementia with gait abnormalities -- Donepezil 10 mg p.o. daily -- Namenda 10 mg p.o. twice daily  IgG kappa multiple myeloma, stage II Follows with [facility], [facility].  Currently under treatment with Daratumumab  and Revlimid.  Continue outpatient follow-up with medical oncology.    DVT prophylaxis:  dabigatran (PRADAXA) capsule 150 mg    Code Status: Full Code Family Communication: Updated spouse present at bedside this morning  Disposition Plan:  Level of care: Telemetry Medical Status is: Inpatient Remains  inpatient appropriate because: IV fluid hydration, pending PT/OT evaluation    Consultants:  None  Procedures:  None  Antimicrobials:  None none   Subjective: Patient seen examined bedside,  resting calmly.  Lying in bed.  Spouse present.  Overall feels "improved".  Pleasantly confused.  Went over with spouse and patient at bedside regarding results of imaging studies, labs.  Believe his underlying weakness is related to his progressive dementia, continue to chemotherapy for his multiple myeloma as well as likely progressive deconditioning given his advanced age as well.  Awaiting PT/OT evaluation.  Continues on IV fluids for mild rhabdomyolysis.  No other specific questions, concerns or complaints at this time.  Denies headache, no dizziness, no chest pain, no palpitations, no shortness of breath, no abdominal pain, no fever/chills/night sweats, no nausea/vomiting/diarrhea, no focal weakness, no fatigue, no paresthesia.  No acute events overnight per nurse staff.  Objective: Vitals:   02/01/23 2248 02/02/23 0255 02/02/23 0700 02/02/23 1123  BP: 112/63 131/72  135/77  Pulse: 94 82 88 93  Resp: 18 16 16 16   Temp: 98.1 F (36.7 C) 98.6 F (37 C) 97.7 F (36.5 C) 98.6 F (37 C)  TempSrc: Oral Oral Oral Oral  SpO2: 100% 97%      Intake/Output Summary (Last 24 hours) at 02/02/2023 1403 Last data filed at 02/02/2023 0850 Gross per 24 hour  Intake 721.77 ml  Output --  Net 721.77 ml   There were no vitals filed for this visit.  Examination:  Physical Exam: GEN: NAD, alert, oriented to place and person but not time HEENT: NCAT, PERRL, EOMI, sclera clear, MMM PULM: CTAB w/o wheezes/crackles, normal respiratory effort, on room air CV: RRR w/o M/G/R GI: abd soft, NTND, NABS, no R/G/M MSK: no peripheral edema, muscle strength globally intact 5/5 bilateral upper/lower extremities NEURO: CN II-XII intact, no focal deficits, sensation to light touch intact PSYCH: normal mood/affect Integumentary: dry/intact, no rashes or wounds    Data Reviewed: I have personally reviewed following labs and imaging studies  CBC: Recent Labs  Lab 02/01/23 1137 02/01/23 2359 02/02/23 0630   WBC 4.5 4.2 3.9*  NEUTROABS 3.9 3.6  --   HGB 13.0 11.8* 11.7*  HCT 37.9* 35.0* 34.2*  MCV 91.1 89.5 89.8  PLT 104* 111* 101*   Basic Metabolic Panel: Recent Labs  Lab 02/01/23 1137 02/01/23 2359 02/02/23 0630  NA 141 139 139  K 3.4* 2.8* 2.8*  CL 102 103 103  CO2 28 28 28   GLUCOSE 100* 121* 97  BUN 15 17 13   CREATININE 0.91 1.06 1.22  CALCIUM 9.2 8.4* 8.2*  MG  --  2.3  --   PHOS  --  2.7  --    GFR: CrCl cannot be calculated (Unknown ideal weight.). Liver Function Tests: Recent Labs  Lab 02/01/23 1137 02/01/23 2359 02/02/23 0630  AST 26 27 25   ALT 19 22 22   ALKPHOS 45 47 44  BILITOT 1.4* 1.1 1.1  PROT 5.9* 5.2* 5.1*  ALBUMIN 3.8 3.1* 2.8*   No results for input(s): "LIPASE", "AMYLASE" in the last 168 hours. No results for input(s): "AMMONIA" in the last 168 hours. Coagulation Profile: No results for input(s): "INR", "PROTIME" in the last 168 hours. Cardiac Enzymes: Recent Labs  Lab 02/01/23 1137 02/02/23 1022  CKTOTAL 706* 306   BNP (last 3 results) No results for input(s): "PROBNP" in the last 8760 hours. HbA1C: No results for input(s): "HGBA1C" in the last 72 hours. CBG: No results for input(s): "  GLUCAP" in the last 168 hours. Lipid Profile: No results for input(s): "CHOL", "HDL", "LDLCALC", "TRIG", "CHOLHDL", "LDLDIRECT" in the last 72 hours. Thyroid Function Tests: Recent Labs    02/01/23 2359  TSH 1.107   Anemia Panel: No results for input(s): "VITAMINB12", "FOLATE", "FERRITIN", "TIBC", "IRON", "RETICCTPCT" in the last 72 hours. Sepsis Labs: No results for input(s): "PROCALCITON", "LATICACIDVEN" in the last 168 hours.  Recent Results (from the past 240 hour(s))  Resp panel by RT-PCR (RSV, Flu A&B, Covid) Urine, Clean Catch     Status: None   Collection Time: 02/01/23  1:16 PM   Specimen: Urine, Clean Catch; Nasal Swab  Result Value Ref Range Status   SARS Coronavirus 2 by RT PCR NEGATIVE NEGATIVE Final    Comment: (NOTE) SARS-CoV-2  target nucleic acids are NOT DETECTED.  The SARS-CoV-2 RNA is generally detectable in upper respiratory specimens during the acute phase of infection. The lowest concentration of SARS-CoV-2 viral copies this assay can detect is 138 copies/mL. A negative result does not preclude SARS-Cov-2 infection and should not be used as the sole basis for treatment or other patient management decisions. A negative result may occur with  improper specimen collection/handling, submission of specimen other than nasopharyngeal swab, presence of viral mutation(s) within the areas targeted by this assay, and inadequate number of viral copies(<138 copies/mL). A negative result must be combined with clinical observations, patient history, and epidemiological information. The expected result is Negative.  Fact Sheet for Patients:  BloggerCourse.com  Fact Sheet for Healthcare Providers:  SeriousBroker.it  This test is no t yet approved or cleared by the [place] FDA and  has been authorized for detection and/or diagnosis of SARS-CoV-2 by FDA under an Emergency Use Authorization (EUA). This EUA will remain  in effect (meaning this test can be used) for the duration of the COVID-19 declaration under Section 564(b)(1) of the Act, 21 U.S.C.section 360bbb-3(b)(1), unless the authorization is terminated  or revoked sooner.       Influenza A by PCR NEGATIVE NEGATIVE Final   Influenza B by PCR NEGATIVE NEGATIVE Final    Comment: (NOTE) The Xpert Xpress SARS-CoV-2/FLU/RSV plus assay is intended as an aid in the diagnosis of influenza from Nasopharyngeal swab specimens and should not be used as a sole basis for treatment. Nasal washings and aspirates are unacceptable for Xpert Xpress SARS-CoV-2/FLU/RSV testing.  Fact Sheet for Patients: BloggerCourse.com  Fact Sheet for Healthcare  Providers: SeriousBroker.it  This test is not yet approved or cleared by the [place] FDA and has been authorized for detection and/or diagnosis of SARS-CoV-2 by FDA under an Emergency Use Authorization (EUA). This EUA will remain in effect (meaning this test can be used) for the duration of the COVID-19 declaration under Section 564(b)(1) of the Act, 21 U.S.C. section 360bbb-3(b)(1), unless the authorization is terminated or revoked.     Resp Syncytial Virus by PCR NEGATIVE NEGATIVE Final    Comment: (NOTE) Fact Sheet for Patients: BloggerCourse.com  Fact Sheet for Healthcare Providers: SeriousBroker.it  This test is not yet approved or cleared by the [place] FDA and has been authorized for detection and/or diagnosis of SARS-CoV-2 by FDA under an Emergency Use Authorization (EUA). This EUA will remain in effect (meaning this test can be used) for the duration of the COVID-19 declaration under Section 564(b)(1) of the Act, 21 U.S.C. section 360bbb-3(b)(1), unless the authorization is terminated or revoked.  Performed at [facility], [address] [place], [place] [address]   Respiratory (~  20 pathogens) panel by PCR     Status: None   Collection Time: 02/02/23 12:34 AM   Specimen: Nasopharyngeal Swab; Respiratory  Result Value Ref Range Status   Adenovirus NOT DETECTED NOT DETECTED Final   Coronavirus 229E NOT DETECTED NOT DETECTED Final    Comment: (NOTE) The Coronavirus on the Respiratory Panel, DOES NOT test for the novel  Coronavirus (2019 nCoV)    Coronavirus HKU1 NOT DETECTED NOT DETECTED Final   Coronavirus NL63 NOT DETECTED NOT DETECTED Final   Coronavirus OC43 NOT DETECTED NOT DETECTED Final   Metapneumovirus NOT DETECTED NOT DETECTED Final   Rhinovirus / Enterovirus NOT DETECTED NOT DETECTED Final   Influenza A NOT DETECTED NOT DETECTED Final   Influenza  B NOT DETECTED NOT DETECTED Final   Parainfluenza Virus 1 NOT DETECTED NOT DETECTED Final   Parainfluenza Virus 2 NOT DETECTED NOT DETECTED Final   Parainfluenza Virus 3 NOT DETECTED NOT DETECTED Final   Parainfluenza Virus 4 NOT DETECTED NOT DETECTED Final   Respiratory Syncytial Virus NOT DETECTED NOT DETECTED Final   Bordetella pertussis NOT DETECTED NOT DETECTED Final   Bordetella Parapertussis NOT DETECTED NOT DETECTED Final   Chlamydophila pneumoniae NOT DETECTED NOT DETECTED Final   Mycoplasma pneumoniae NOT DETECTED NOT DETECTED Final    Comment: Performed at [facility], [address] [place], [place] [address]         Radiology Studies: MR BRAIN WO CONTRAST  Result Date: 02/02/2023 CLINICAL DATA:  Acute neurologic deficit EXAM: MRI HEAD WITHOUT CONTRAST TECHNIQUE: Multiplanar, multiecho pulse sequences of the brain and surrounding structures were obtained without intravenous contrast. COMPARISON:  04/08/2021 FINDINGS: Brain: No acute infarct, mass effect or extra-axial collection. No chronic microhemorrhage or siderosis. There is multifocal hyperintense T2-weighted signal within the white matter. Generalized volume loss. The midline structures are normal. Vascular: Normal flow voids. Skull and upper cervical spine: Normal marrow signal. Sinuses/Orbits: Paranasal sinuses are clear. No mastoid effusion. Normal orbits. Ocular lens replacements. Other: None IMPRESSION: 1. No acute intracranial abnormality. 2. Findings of chronic small vessel ischemia and volume loss. Electronically Signed   By: Deatra Robinson M.D.   On: 02/02/2023 02:18   DG Chest Portable 1 View  Result Date: 02/01/2023 CLINICAL DATA:  Cough. EXAM: PORTABLE CHEST 1 VIEW COMPARISON:  Chest radiograph dated February 17, 2022. FINDINGS: The cardiac silhouette is mildly enlarged, but stable. Mediastinal contours are within normal limits. Similar chronic blunting of the left costophrenic angle. A trace left effusion  can not be excluded. Mild left basilar atelectasis. No focal consolidation or pneumothorax. No acute osseous abnormality. IMPRESSION: Blunting of the left costophrenic angle is likely chronic, although, a trace left pleural effusion can not be excluded. Otherwise, no acute cardiopulmonary findings. Electronically Signed   By: Hart Robinsons M.D.   On: 02/01/2023 13:03   CT Head Wo Contrast  Result Date: 02/01/2023 CLINICAL DATA:  Slid out of wheelchair last night and slid out of bed, head and neck trauma, on blood thinners EXAM: CT HEAD WITHOUT CONTRAST CT CERVICAL SPINE WITHOUT CONTRAST TECHNIQUE: Multidetector CT imaging of the head and cervical spine was performed following the standard protocol without intravenous contrast. Multiplanar CT image reconstructions of the cervical spine were also generated. RADIATION DOSE REDUCTION: This exam was performed according to the departmental dose-optimization program which includes automated exposure control, adjustment of the mA and/or kV according to patient size and/or use of iterative reconstruction technique. COMPARISON:  No prior CT head available, correlation is  made with 04/08/2021 MRI head; no prior CT cervical spine available, correlation is made with 08/21/2020 MRI cervical spine FINDINGS: CT HEAD FINDINGS Brain: No evidence of acute infarct, hemorrhage, mass, mass effect, or midline shift. No hydrocephalus or extra-axial fluid collection. Age related cerebral atrophy. Periventricular white matter changes, likely the sequela of chronic small vessel ischemic disease. Vascular: No hyperdense vessel. Atherosclerotic calcifications in the intracranial carotid and vertebral arteries. Skull: Negative for fracture or focal lesion. Sinuses/Orbits: Mucosal thickening in the ethmoid air cells and right maxillary sinus. Status post bilateral lens replacements. Other: The mastoid air cells are well aerated. CT CERVICAL SPINE FINDINGS Alignment: No traumatic listhesis.  Skull base and vertebrae: No acute fracture or suspicious osseous lesion. Soft tissues and spinal canal: No prevertebral fluid or swelling. No visible canal hematoma. Disc levels: Degenerative changes in the cervical spine.No high-grade spinal canal stenosis. Upper chest: No focal pulmonary opacity or pleural effusion. IMPRESSION: 1. No acute intracranial process. 2. No acute fracture or traumatic listhesis in the cervical spine. Electronically Signed   By: Wiliam Ke M.D.   On: 02/01/2023 12:34   CT Cervical Spine Wo Contrast  Result Date: 02/01/2023 CLINICAL DATA:  Slid out of wheelchair last night and slid out of bed, head and neck trauma, on blood thinners EXAM: CT HEAD WITHOUT CONTRAST CT CERVICAL SPINE WITHOUT CONTRAST TECHNIQUE: Multidetector CT imaging of the head and cervical spine was performed following the standard protocol without intravenous contrast. Multiplanar CT image reconstructions of the cervical spine were also generated. RADIATION DOSE REDUCTION: This exam was performed according to the departmental dose-optimization program which includes automated exposure control, adjustment of the mA and/or kV according to patient size and/or use of iterative reconstruction technique. COMPARISON:  No prior CT head available, correlation is made with 04/08/2021 MRI head; no prior CT cervical spine available, correlation is made with 08/21/2020 MRI cervical spine FINDINGS: CT HEAD FINDINGS Brain: No evidence of acute infarct, hemorrhage, mass, mass effect, or midline shift. No hydrocephalus or extra-axial fluid collection. Age related cerebral atrophy. Periventricular white matter changes, likely the sequela of chronic small vessel ischemic disease. Vascular: No hyperdense vessel. Atherosclerotic calcifications in the intracranial carotid and vertebral arteries. Skull: Negative for fracture or focal lesion. Sinuses/Orbits: Mucosal thickening in the ethmoid air cells and right maxillary sinus. Status  post bilateral lens replacements. Other: The mastoid air cells are well aerated. CT CERVICAL SPINE FINDINGS Alignment: No traumatic listhesis. Skull base and vertebrae: No acute fracture or suspicious osseous lesion. Soft tissues and spinal canal: No prevertebral fluid or swelling. No visible canal hematoma. Disc levels: Degenerative changes in the cervical spine.No high-grade spinal canal stenosis. Upper chest: No focal pulmonary opacity or pleural effusion. IMPRESSION: 1. No acute intracranial process. 2. No acute fracture or traumatic listhesis in the cervical spine. Electronically Signed   By: Wiliam Ke M.D.   On: 02/01/2023 12:34        Scheduled Meds:  dabigatran  150 mg Oral BID   diltiazem  120 mg Oral q morning   donepezil  10 mg Oral Daily   levalbuterol  0.63 mg Nebulization Q6H   memantine  10 mg Oral BID   metoprolol succinate  25 mg Oral q morning   montelukast  10 mg Oral q AM   pantoprazole  40 mg Oral Daily   potassium chloride  30 mEq Oral Q3H   pravastatin  20 mg Oral Daily   Continuous Infusions:  sodium chloride 100 mL/hr at 02/02/23 1109  LOS: 0 days    Time spent: 57 minutes spent on chart review, discussion with nursing staff, consultants, updating family and interview/physical exam; more than 50% of that time was spent in counseling and/or coordination of care.    Alvira Philips [place], DO [facility] Available via Epic secure chat 7am-7pm After these hours, please refer to coverage provider listed on [email or web address] 02/02/2023, 2:03 PM

## 2023-02-02 NOTE — Evaluation (Signed)
Physical Therapy Evaluation Patient Details Name: [name]. MRN: [id number] DOB: 02/09/1941 Today's Date: 02/02/2023  History of Present Illness  82 y.o. male who presents to ED 02/01/23 s/p fall last evening where he was unable to get up. Per patient he slipped off bed. PMH significant for atrial fibrillation on pradaxa, BPH, Dementia, cervical spinal stenosis,GERD,COPD,HTN, HLD, OSA, RCC in 2009, pancreatic cyst consistent with IPMN ,multiple myeloma, currently under treatment.  Clinical Impression   Pt admitted secondary to problem above with deficits below. PTA patient was living fairly independently in a duplex with a connecting door to his girlfriend's duplex. She reports these are his first falls.  Pt currently requires CGA x 200 ft ambulation. Patient with shuffling gait with slight anterior pitch which girlfriend reports is his current normal. She reports she can provide supervision for pt on discharge. Anticipate patient will benefit from PT to address problems listed below.Will continue to follow acutely to maximize functional mobility independence and safety.  Patient could benefit from HHPT with home safety evaluation.          If plan is discharge home, recommend the following: A little help with walking and/or transfers;A little help with bathing/dressing/bathroom;Assistance with cooking/housework;Direct supervision/assist for medications management;Direct supervision/assist for financial management;Assist for transportation;Supervision due to cognitive status   Can travel by private vehicle        Equipment Recommendations None recommended by PT  Recommendations for Other Services       Functional Status Assessment Patient has had a recent decline in their functional status and demonstrates the ability to make significant improvements in function in a reasonable and predictable amount of time.     Precautions / Restrictions Precautions Precautions: Fall;Other  (comment) Precaution Comments: orthostasis      Mobility  Bed Mobility Overal bed mobility: Needs Assistance Bed Mobility: Rolling, Sidelying to Sit Rolling: Supervision, Used rails Sidelying to sit: Contact guard assist, HOB elevated, Used rails       General bed mobility comments: pt close to edge; guarding for safety    Transfers Overall transfer level: Needs assistance Equipment used: None, Rolling walker (2 wheels) Transfers: Sit to/from Stand Sit to Stand: Contact guard assist           General transfer comment: from EOB, recliner and BSC; pt initially reaches for a hand to pull up on, but able to push himself up with cues    Ambulation/Gait Ambulation/Gait assistance: Contact guard assist Gait Distance (Feet): 180 Feet Assistive device: None Gait Pattern/deviations: Step-through pattern, Decreased stride length, Decreased dorsiflexion - right, Decreased dorsiflexion - left, Shuffle   Gait velocity interpretation: 1.31 - 2.62 ft/sec, indicative of limited community ambulator   General Gait Details: lands on forefoot/shuffles; occasional anterior lean but recovers, slows down with cues  Stairs            Wheelchair Mobility     Tilt Bed    Modified Rankin (Stroke Patients Only)       Balance Overall balance assessment: Needs assistance Sitting-balance support: No upper extremity supported, Feet supported Sitting balance-Leahy Scale: Good     Standing balance support: No upper extremity supported Standing balance-Leahy Scale: Fair                               Pertinent Vitals/Pain Pain Assessment Pain Assessment: No/denies pain    Home Living Family/patient expects to be discharged to:: Private residence Living Arrangements: Spouse/significant other (  girlfriend of 40+ years) Available Help at Discharge: Family;Available 24 hours/day Type of Home: House Home Access: Level entry       Home Layout: One level Home  Equipment: Rollator (4 wheels);Rolling Walker (2 wheels);Shower seat;Transport chair Additional Comments: items were given to them by a neighbor; pt has never used    Prior Function Prior Level of Function : Needs assist  Cognitive Assist : ADLs (cognitive)           Mobility Comments: walked with no device; shuffling gait ADLs Comments: assist with IADLs     Extremity/Trunk Assessment   Upper Extremity Assessment Upper Extremity Assessment: Generalized weakness    Lower Extremity Assessment Lower Extremity Assessment: Generalized weakness    Cervical / Trunk Assessment Cervical / Trunk Assessment: Normal  Communication   Communication Communication: Hearing impairment Cueing Techniques: Verbal cues;Gestural cues;Tactile cues  Cognition Arousal: Alert Behavior During Therapy: WFL for tasks assessed/performed Overall Cognitive Status: History of cognitive impairments - at baseline                                 General Comments: could not recall most home set-up questions and deferred to his girlfriend; not oriented to situation        General Comments General comments (skin integrity, edema, etc.): Girlfriend of 40+ years present and asking multiple questions related to his drop in BP during orthostatics.    Exercises     Assessment/Plan    PT Assessment Patient needs continued PT services  PT Problem List Decreased strength;Decreased balance;Decreased mobility;Decreased cognition;Decreased knowledge of use of DME;Decreased safety awareness;Cardiopulmonary status limiting activity       PT Treatment Interventions DME instruction;Gait training;Functional mobility training;Therapeutic activities;Therapeutic exercise;Balance training;Patient/family education    PT Goals (Current goals can be found in the Care Plan section)  Acute Rehab PT Goals Patient Stated Goal: go back home PT Goal Formulation: With patient/family Time For Goal Achievement:  02/16/23 Potential to Achieve Goals: Good    Frequency Min 1X/week     Co-evaluation               AM-PAC PT "6 Clicks" Mobility  Outcome Measure Help needed turning from your back to your side while in a flat bed without using bedrails?: A Little Help needed moving from lying on your back to sitting on the side of a flat bed without using bedrails?: A Little Help needed moving to and from a bed to a chair (including a wheelchair)?: A Little Help needed standing up from a chair using your arms (e.g., wheelchair or bedside chair)?: A Little Help needed to walk in hospital room?: A Little Help needed climbing 3-5 steps with a railing? : A Little 6 Click Score: 18    End of Session Equipment Utilized During Treatment: Gait belt Activity Tolerance: Patient tolerated treatment well Patient left: in chair;with call bell/phone within reach;with chair alarm set;with family/visitor present Nurse Communication: Mobility status;Other (comment) (+BM and emptied bladder) PT Visit Diagnosis: Unsteadiness on feet (R26.81);Other abnormalities of gait and mobility (R26.89);History of falling (Z91.81)    Time: 1610-9604 PT Time Calculation (min) (ACUTE ONLY): 44 min   Charges:   PT Evaluation $PT Eval Low Complexity: 1 Low PT Treatments $Gait Training: 23-37 mins PT General Charges $$ ACUTE PT VISIT: 1 Visit          Jerolyn Center, PT Acute Rehabilitation Services  Office [phone]   Zena Amos  02/02/2023, 3:10 PM

## 2023-02-02 NOTE — Telephone Encounter (Signed)
Pt's common law wife, Delton [place] pt has been admitted to the hospital for a fall. Was walking on the carpet and just went down to the floor, no injuries. Calling to see if there is a earlier appointment. Informed Ms. Doreene Adas nurse will call back if there is an earlier appointment available.

## 2023-02-03 DIAGNOSIS — R269 Unspecified abnormalities of gait and mobility: Secondary | ICD-10-CM | POA: Diagnosis not present

## 2023-02-03 LAB — BASIC METABOLIC PANEL
Anion gap: 8 (ref 5–15)
BUN: 13 mg/dL (ref 8–23)
CO2: 21 mmol/L — ABNORMAL LOW (ref 22–32)
Calcium: 7.8 mg/dL — ABNORMAL LOW (ref 8.9–10.3)
Chloride: 108 mmol/L (ref 98–111)
Creatinine, Ser: 1.17 mg/dL (ref 0.61–1.24)
GFR, Estimated: >60 mL/min (ref >60)
Glucose, Bld: 117 mg/dL — ABNORMAL HIGH (ref 70–99)
Potassium: 3.6 mmol/L (ref 3.5–5.1)
Sodium: 137 mmol/L (ref 135–145)

## 2023-02-03 LAB — MAGNESIUM: Magnesium: 1.9 mg/dL (ref 1.7–2.4)

## 2023-02-03 LAB — CK: Total CK: 168 U/L (ref 49–397)

## 2023-02-03 MED ORDER — POTASSIUM CHLORIDE CRYS ER 20 MEQ PO TBCR
40.0000 meq | EXTENDED_RELEASE_TABLET | Freq: Once | ORAL | Status: AC
  Administered 2023-02-03: 40 meq via ORAL
  Filled 2023-02-03: qty 2

## 2023-02-03 NOTE — Progress Notes (Signed)
    Durable Medical Equipment  (From admission, onward)           Start     Ordered   02/03/23 1117  For home use only DME Bedside commode  Once       Question:  Patient needs a bedside commode to treat with the following condition  Answer:  Weakness   02/03/23 1116            The patient is confined to one level of the home environment and there is no toilet on the that level of the home.

## 2023-02-03 NOTE — Evaluation (Signed)
Occupational Therapy Evaluation Patient Details Name: [name]. MRN: [id number] DOB: [birth year] Today's Date: 02/03/2023   History of Present Illness 82 y.o. male who presents to ED 02/01/23 s/p fall last evening where he was unable to get up. Per patient he slipped off bed. PMH significant for atrial fibrillation on pradaxa, BPH, Dementia, cervical spinal stenosis,GERD,COPD,HTN, HLD, OSA, RCC in 2009, pancreatic cyst consistent with IPMN ,multiple myeloma, currently under treatment.   Clinical Impression   [name] was evaluated s/p the above admission list. Per his SO, he is mod I for all ADLs and mobility without DME at baseline, she assists with all IADLs. They live in attached duplexes with a joining interior door. Upon evaluation the pt was limited by baseline cognition, fatigue, lethargy, unsteady gait and limited activity tolerance. Overall he needed CGA with gait belt and HHA for tranfers and short distance mobility. Due to the deficits listed below the pt also needs up to min A for ADLs. Pt and SO were given maximal education on compensatory techniques during ADLs to reduce fall risk, pt's spouse was receptive and appreciative. Pt will benefit from continued acute OT services and HHOT.        If plan is discharge home, recommend the following: A little help with walking and/or transfers;A little help with bathing/dressing/bathroom;Assistance with cooking/housework;Direct supervision/assist for medications management;Direct supervision/assist for financial management;Assist for transportation;Help with stairs or ramp for entrance;Supervision due to cognitive status    Functional Status Assessment  Patient has had a recent decline in their functional status and demonstrates the ability to make significant improvements in function in a reasonable and predictable amount of time.  Equipment Recommendations  BSC/3in1       Precautions / Restrictions Precautions Precautions:  Fall;Other (comment) Precaution Comments: orthostasis Restrictions Weight Bearing Restrictions: No      Mobility Bed Mobility Overal bed mobility: Needs Assistance Bed Mobility: Sit to Supine       Sit to supine: Contact guard assist        Transfers Overall transfer level: Needs assistance Equipment used: 1 person hand held assist Transfers: Sit to/from Stand Sit to Stand: Contact guard assist                  Balance Overall balance assessment: Needs assistance Sitting-balance support: No upper extremity supported, Feet supported Sitting balance-Leahy Scale: Good     Standing balance support: Single extremity supported, During functional activity Standing balance-Leahy Scale: Poor                             ADL either performed or assessed with clinical judgement   ADL Overall ADL's : Needs assistance/impaired Eating/Feeding: Set up   Grooming: Set up;Sitting Grooming Details (indicate cue type and reason): pts SO assisting with oral hygiene upon arrival while pt was sitting in chair Upper Body Bathing: Supervision/ safety;Sitting   Lower Body Bathing: Minimal assistance;Sit to/from stand   Upper Body Dressing : Supervision/safety;Sitting   Lower Body Dressing: Minimal assistance Lower Body Dressing Details (indicate cue type and reason): educated pt's SO to encourage pt to complete all LB ADLs in sitting Toilet Transfer: Contact guard assist;Ambulation Toilet Transfer Details (indicate cue type and reason): HHA Toileting- Clothing Manipulation and Hygiene: Supervision/safety Toileting - Clothing Manipulation Details (indicate cue type and reason): encouraged all toileting in sitting   Tub/Shower Transfer Details (indicate cue type and reason): educated pt and SO on sitting to shower to decreaase  fall risk Functional mobility during ADLs: Contact guard assist General ADL Comments: maximal education provided to pt and SO on ways to decrease  fall risk in the home environment.     Vision Baseline Vision/History: 1 Wears glasses Vision Assessment?: No apparent visual deficits     Perception Perception: Not tested       Praxis Praxis: Not tested       Pertinent Vitals/Pain Pain Assessment Pain Assessment: No/denies pain     Extremity/Trunk Assessment Upper Extremity Assessment Upper Extremity Assessment: Generalized weakness   Lower Extremity Assessment Lower Extremity Assessment: Defer to PT evaluation   Cervical / Trunk Assessment Cervical / Trunk Assessment: Kyphotic   Communication Communication Communication: Hearing impairment   Cognition Arousal: Lethargic Behavior During Therapy: WFL for tasks assessed/performed Overall Cognitive Status: History of cognitive impairments - at baseline                 General Comments: oriented to self and girlfriend only. pleasantly confused but follows most simple commands with increased time and cues if needed.     General Comments  VSS, pt fatigued from prior PT session     Home Living Family/patient expects to be discharged to:: Private residence Living Arrangements: Spouse/significant other Available Help at Discharge: Family;Available 24 hours/day Type of Home: House Home Access: Level entry     Home Layout: One level     Bathroom Shower/Tub: Producer, television/film/video: Handicapped height     Home Equipment: Rollator (4 wheels);Rolling Walker (2 wheels);Shower seat;Transport chair;Grab bars - tub/shower   Additional Comments: items were given to them by a neighbor; pt has never used      Prior Functioning/Environment Prior Level of Function : Needs assist  Cognitive Assist : ADLs (cognitive)           Mobility Comments: walked with no device; shuffling gait ADLs Comments: assist with IADLs        OT Problem List: Decreased strength;Decreased range of motion;Decreased activity tolerance;Impaired balance (sitting and/or  standing);Decreased cognition;Decreased knowledge of use of DME or AE;Decreased safety awareness;Decreased knowledge of precautions      OT Treatment/Interventions: Self-care/ADL training;Therapeutic exercise;DME and/or AE instruction;Therapeutic activities;Patient/family education;Balance training    OT Goals(Current goals can be found in the care plan section) Acute Rehab OT Goals Patient Stated Goal: home OT Goal Formulation: With patient Time For Goal Achievement: 02/17/23 Potential to Achieve Goals: Good ADL Goals Pt Will Perform Upper Body Dressing: with modified independence Pt Will Perform Lower Body Dressing: with modified independence Pt Will Transfer to Toilet: with modified independence;ambulating Additional ADL Goal #1: Pt will navigate hospital environment with mod I to demonstrate reduced risk for falls at discharge  OT Frequency: Min 1X/week       AM-PAC OT "6 Clicks" Daily Activity     Outcome Measure Help from another person eating meals?: A Little Help from another person taking care of personal grooming?: A Little Help from another person toileting, which includes using toliet, bedpan, or urinal?: A Little Help from another person bathing (including washing, rinsing, drying)?: A Little Help from another person to put on and taking off regular upper body clothing?: A Little Help from another person to put on and taking off regular lower body clothing?: A Little 6 Click Score: 18   End of Session Equipment Utilized During Treatment: Gait belt Nurse Communication: Mobility status  Activity Tolerance: Patient tolerated treatment well Patient left: in bed;with call bell/phone within reach;with bed alarm set;with family/visitor  present  OT Visit Diagnosis: Unsteadiness on feet (R26.81);Other abnormalities of gait and mobility (R26.89);Muscle weakness (generalized) (M62.81)                Time: 1030-1051 OT Time Calculation (min): 21 min Charges:  OT General  Charges $OT Visit: 1 Visit OT Evaluation $OT Eval Moderate Complexity: 1 Mod  Derenda Mis, OTR/L Acute Rehabilitation Services Office [phone] Secure Chat Communication Preferred   Donia Pounds 02/03/2023, 11:24 AM

## 2023-02-03 NOTE — Progress Notes (Signed)
Physical Therapy Treatment Patient Details Name: [name]. MRN: [id number] DOB: 1940/12/08 Today's Date: 02/03/2023   History of Present Illness 82 y.o. male who presents to ED 02/01/23 s/p fall last evening where he was unable to get up. Per patient he slipped off bed. PMH significant for atrial fibrillation on pradaxa, BPH, Dementia, cervical spinal stenosis,GERD,COPD,HTN, HLD, OSA, RCC in 2009, pancreatic cyst consistent with IPMN ,multiple myeloma, currently under treatment.    PT Comments  Pt received up in chair and agreeable to PT. Able to perform ADL's I.e. toileting, combing hair, washing hands at sink with supervision and cueing. Pt ambulating a total of 200 ft with no assistive device at a contact guard assist level; requires one seated rest break. HR 95-137 afib. Pt presents as a high fall risk based on decreased gait speed, shuffling gait pattern, decreased safety awareness, history of falls. Discussed fall prevention techniques with pt girlfriend and provided with gait belt and instructed on use. Recommend HHPT at d/c.   If plan is discharge home, recommend the following: A little help with walking and/or transfers;A little help with bathing/dressing/bathroom;Assistance with cooking/housework;Direct supervision/assist for medications management;Direct supervision/assist for financial management;Assist for transportation;Supervision due to cognitive status   Can travel by private vehicle        Equipment Recommendations  None recommended by PT    Recommendations for Other Services       Precautions / Restrictions Precautions Precautions: Fall;Other (comment) Precaution Comments: orthostasis Restrictions Weight Bearing Restrictions: No     Mobility  Bed Mobility               General bed mobility comments: OOB in chair upon entry    Transfers Overall transfer level: Needs assistance Equipment used: None Transfers: Sit to/from Stand Sit to Stand:  Contact guard assist           General transfer comment: CGA for safety, but no physical assist required    Ambulation/Gait Ambulation/Gait assistance: Contact guard assist Gait Distance (Feet): 200 Feet (150", 50') Assistive device: None Gait Pattern/deviations: Step-through pattern, Decreased stride length, Decreased dorsiflexion - right, Decreased dorsiflexion - left, Shuffle Gait velocity: decreased Gait velocity interpretation: <1.8 ft/sec, indicate of risk for recurrent falls   General Gait Details: Shuffling gait pattern with decreased stride length and difficulty controlling anterior momentum. Close CGA. Does not self monitor for activity tolerance or have carryover for picking feet up or larger step lengths   Stairs             Wheelchair Mobility     Tilt Bed    Modified Rankin (Stroke Patients Only)       Balance Overall balance assessment: Needs assistance Sitting-balance support: No upper extremity supported, Feet supported Sitting balance-Leahy Scale: Good     Standing balance support: Single extremity supported, During functional activity Standing balance-Leahy Scale: Poor Standing balance comment: reliant on single UE support when combing hair at sink. Can stand unsupported, but needs supervision-CGA for safety                            Cognition Arousal: Alert Behavior During Therapy: WFL for tasks assessed/performed Overall Cognitive Status: History of cognitive impairments - at baseline                                 General Comments: A&O to self and birthday; pt girlfriend  reports this is baseline. Pt able to recall month is December with cue of "Christmas." could not state he was in a hospital when given options. Follows most one step commands, although has difficulty with any type of carryover        Exercises      General Comments        Pertinent Vitals/Pain Pain Assessment Pain Assessment:  No/denies pain    Home Living                          Prior Function            PT Goals (current goals can now be found in the care plan section) Acute Rehab PT Goals Patient Stated Goal: go back home PT Goal Formulation: With patient/family Time For Goal Achievement: 02/16/23 Potential to Achieve Goals: Good Progress towards PT goals: Progressing toward goals    Frequency    Min 1X/week      PT Plan      Co-evaluation              AM-PAC PT "6 Clicks" Mobility   Outcome Measure  Help needed turning from your back to your side while in a flat bed without using bedrails?: A Little Help needed moving from lying on your back to sitting on the side of a flat bed without using bedrails?: A Little Help needed moving to and from a bed to a chair (including a wheelchair)?: A Little Help needed standing up from a chair using your arms (e.g., wheelchair or bedside chair)?: A Little Help needed to walk in hospital room?: A Little Help needed climbing 3-5 steps with a railing? : A Little 6 Click Score: 18    End of Session Equipment Utilized During Treatment: Gait belt Activity Tolerance: Patient tolerated treatment well Patient left: in chair;with call bell/phone within reach;with chair alarm set;with family/visitor present Nurse Communication: Mobility status PT Visit Diagnosis: Unsteadiness on feet (R26.81);Other abnormalities of gait and mobility (R26.89);History of falling (Z91.81)     Time: 9629-5284 PT Time Calculation (min) (ACUTE ONLY): 47 min  Charges:    $Gait Training: 8-22 mins $Therapeutic Activity: 23-37 mins PT General Charges $$ ACUTE PT VISIT: 1 Visit                     Lillia Pauls, PT, DPT Acute Rehabilitation Services Office [phone]    Norval Morton 02/03/2023, 11:07 AM

## 2023-02-03 NOTE — TOC Transition Note (Signed)
Transition of Care [facility]) - CM/SW Discharge Note   Patient Details  Name: [name]. MRN: [id number] Date of Birth: 04-20-1940  [facility] CM/SW Contact:  Kermit Balo, RN Phone Number: 02/03/2023, 2:12 PM   Clinical Narrative:     The patient is discharging home with home health through [facility]. Information on the AVS. [facility] will contact the patient for the first home visit.  BSC for home delivered to the room per [employer].  Family is arranging caregivers for the home.  SO to  provide transport home.  Final next level of care: Home w Home Health Services Barriers to Discharge: No Barriers Identified   Patient Goals and CMS Choice CMS Medicare.gov Compare Post Acute Care list provided to:: Patient Represenative (must comment) Choice offered to / list presented to : Adult Children  Discharge Placement                         Discharge Plan and Services Additional resources added to the After Visit Summary for     Discharge Planning Services: CM Consult Post Acute Care Choice: Home Health                    HH Arranged: PT, OT HH Agency:  ([facility] home)        Social Determinants of Health (SDOH) Interventions SDOH Screenings   Food Insecurity: No Food Insecurity (02/02/2023)  Housing: Low Risk  (02/02/2023)  Transportation Needs: No Transportation Needs (02/02/2023)  Utilities: Not At Risk (02/02/2023)  Tobacco Use: Medium Risk (02/01/2023)     Readmission Risk Interventions     No data to display

## 2023-02-03 NOTE — Discharge Summary (Signed)
Physician Discharge Summary  [name] XBM:[id number] DOB: February 06, 1941 DOA: 02/01/2023  PCP: [name] Putt, MD  Admit date: 02/01/2023 Discharge date: 02/03/2023  Admitted From: Home Disposition: Home with home health  Recommendations for Outpatient Follow-up:  Follow up with PCP in 1-2 weeks Follow-up with neurology as scheduled Follow-up with medical oncology as scheduled Continue goals of care discussions outpatient with PCP, medical specialist  Home Health: PT/OT/RN/social work Equipment/Devices: Bedside commode  Discharge Condition: Stable CODE STATUS: Full code Diet recommendation: Heart healthy diet  History of present illness:  [name]. is a 82 y.o. 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  Spouse assist with HPI.  Apparently patient has had some progressive weakness for some time but never had an episode where he was too weak to stand.  Patient denied fever/chills, no nausea/vomiting, no diarrhea.  Denies loss of consciousness or syncopal episode.   In the ED, temperature 97.9 F, HR 58, RR 13, BP 128/66, SpO2 99% on room air.  WBC 4.5, hemoglobin 13.0, platelet count 104.  Sodium 141, potassium 3.4, chloride 102, CO2 28, glucose 100, BUN 15, creatinine 0.91.  CK7 06.  COVID/RSV/influenza PCR negative.  Urinalysis with 40 ketones, small hemoglobin, trace protein, otherwise unrevealing.  Respiratory viral panel negative.  CT head/cervical spine with no acute intracranial process, no acute fracture or traumatic listhesis in the C-spine.  MRI brain without contrast with no  acute intracranial abnormality, findings of chronic small vessel ischemia and volume loss.  Chest x-ray with blunting of the left costophrenic angle, trace left pleural effusion otherwise no acute cardiopulmonary disease findings.  TRH was consulted and patient was transferred to [facility] for further evaluation and management.  Hospital course:  Progressive weakness/falls/deconditioning Mild rhabdomyolysis Patient presenting to ED after sliding out of his chair at home and unable to get off the floor, apparently laying on the floor overnight.  Was noted to have an elevated CK level of 706 on admission.  TSH within normal limits.  Per review of EMR/outpatient notes he has had progressive dementia with gait abnormalities.  Initially concern for stroke, workup with CT head, MRI brain with no acute findings.  Recent MR L-spine November 2024 with some mild lumbar spinal stenosis but no other concerning findings.  Suspect etiology of his progressive weakness likely related to his advanced age coupled with chemotherapy for his multiple myeloma and progressive dementia.  Spouse reports that patient has pending nerve conduction studies/EMG scheduled outpatient later in December.  Patient was seen by PT and OT and has been able to ambulate without assistance using a rolling walker.  Discharging home with home health.  Recommend continue goals of care discussion outpatient given his advanced age, comorbidities and terminal illness of dementia is overall long-term prognosis poor.   Hypokalemia Repleted during hospitalization.   Persistent atrial fibrillation Essential hypertension Follows with cardiology outpatient, Dr. [place].  Continue Cardizem CD 120 mg p.o. daily, Metoprolol 25 mg p.o. every morning, Pradaxa 150 mg p.o. twice daily   CKD stage II Creatinine stable, 1.17 at time of discharge.   Hyperlipidemia Pravastatin 20 mg p.o. daily  COPD Xopenex neb every 6 hours as needed  wheezing/shortness of breath   Hx dementia with gait abnormalities Donepezil 10 mg p.o. daily,m Namenda 10 mg p.o. twice daily   IgG kappa multiple myeloma, stage II Follows with [facility] health, [facility].  Currently under treatment with Daratumumab  and Revlimid.  Continue outpatient follow-up with medical oncology.  Discharge Diagnoses:  Principal Problem:   Gait disorder    Discharge Instructions  Discharge Instructions     Call MD for:  difficulty breathing, headache or visual disturbances   Complete by: As directed    Call MD for:  extreme fatigue   Complete by: As directed    Call MD for:  persistant dizziness or light-headedness   Complete by: As directed    Call MD for:  persistant nausea and vomiting   Complete by: As directed    Call MD for:  severe uncontrolled pain   Complete by: As directed    Call MD for:  temperature >100.4   Complete by: As directed    Diet - low sodium heart healthy   Complete by: As directed    Increase activity slowly   Complete by: As directed       Allergies as of 02/03/2023       Reactions   Tamsulosin Other (See Comments)   dizziness   Proair Hfa [albuterol] Palpitations   Only use in emergency due to has heart problems        Medication List     STOP taking these medications    fluorouracil 5 % cream Commonly known as: EFUDEX       TAKE these medications    acyclovir 400 MG tablet Commonly known as: ZOVIRAX Take 400 mg by mouth 2 (two) times daily.   alfuzosin 10 MG 24 hr tablet Commonly known as: UROXATRAL Take 1 tablet by mouth every evening.   AMBULATORY NON FORMULARY MEDICATION CPAP at night   calcium citrate-vitamin D 315-200 MG-UNIT tablet Commonly known as: CITRACAL+D Take 2 tablets by mouth daily.   Cartia XT 120 MG 24 hr capsule Generic drug: diltiazem Take 120 mg by mouth every morning.   chlorthalidone 25 MG tablet Commonly known as: HYGROTON Take 25 mg by  mouth as needed (As needed per wife).   DARATUMUMAB IV Inject 1 Dose into the skin every 30 (thirty) days.   donepezil 10 MG tablet Commonly known as: ARICEPT Take 10 mg by mouth daily.   dutasteride 0.5 MG capsule Commonly known as: AVODART Take 0.5 mg by mouth every evening.   lenalidomide 10 MG capsule Commonly known as: REVLIMID Take 10 mg by mouth daily. *take 1 tab daily on days 1-21 of each 28 day cycle*   Melatonin 1 MG Caps Take by mouth.   memantine 10 MG tablet Commonly known as: NAMENDA Take 10 mg by mouth 2 (two) times daily.   metoprolol succinate 25 MG 24 hr tablet Commonly known as: TOPROL-XL Take 25 mg by mouth every morning.   montelukast 10 MG tablet Commonly known as: SINGULAIR TAKE 1 TABLET(10 MG) BY MOUTH AT BEDTIME What changed: See the new instructions.   multivitamin with minerals tablet Take 1 tablet by mouth daily.   omeprazole 20 MG capsule Commonly known as: PRILOSEC TAKE ONE CAPSULE BY MOUTH EVERY DAY   potassium chloride 10 MEQ CR capsule Commonly known as: MICRO-K Take 20 mEq by mouth daily.   Pradaxa 150 MG Caps capsule Generic drug: dabigatran TAKE  ONE CAPSULE BY MOUTH TWICE DAILY   pravastatin 20 MG tablet Commonly known as: PRAVACHOL Take 20 mg daily by mouth.   tadalafil 5 MG tablet Commonly known as: CIALIS Take 1 tablet by mouth daily.   zoledronic acid 4 MG/5ML injection Commonly known as: ZOMETA Inject 4 mg into the vein once. Yearly               Durable Medical Equipment  (From admission, onward)           Start     Ordered   02/03/23 1117  For home use only DME Bedside commode  Once       Question:  Patient needs a bedside commode to treat with the following condition  Answer:  Weakness   02/03/23 1116            Follow-up Information     [name] Putt, MD. Call in 1 day.   Specialty: Family Medicine Contact information: [address] [place] [address] [phone]                 Allergies  Allergen Reactions   Tamsulosin Other (See Comments)    dizziness   Proair Hfa [Albuterol] Palpitations    Only use in emergency due to has heart problems    Consultations: None   Procedures/Studies: MR BRAIN WO CONTRAST  Result Date: 02/02/2023 CLINICAL DATA:  Acute neurologic deficit EXAM: MRI HEAD WITHOUT CONTRAST TECHNIQUE: Multiplanar, multiecho pulse sequences of the brain and surrounding structures were obtained without intravenous contrast. COMPARISON:  04/08/2021 FINDINGS: Brain: No acute infarct, mass effect or extra-axial collection. No chronic microhemorrhage or siderosis. There is multifocal hyperintense T2-weighted signal within the white matter. Generalized volume loss. The midline structures are normal. Vascular: Normal flow voids. Skull and upper cervical spine: Normal marrow signal. Sinuses/Orbits: Paranasal sinuses are clear. No mastoid effusion. Normal orbits. Ocular lens replacements. Other: None IMPRESSION: 1. No acute intracranial abnormality. 2. Findings of chronic small vessel ischemia and volume loss. Electronically Signed   By: Deatra Robinson M.D.   On: 02/02/2023 02:18   DG Chest Portable 1 View  Result Date: 02/01/2023 CLINICAL DATA:  Cough. EXAM: PORTABLE CHEST 1 VIEW COMPARISON:  Chest radiograph dated February 17, 2022. FINDINGS: The cardiac silhouette is mildly enlarged, but stable. Mediastinal contours are within normal limits. Similar chronic blunting of the left costophrenic angle. A trace left effusion can not be excluded. Mild left basilar atelectasis. No focal consolidation or pneumothorax. No acute osseous abnormality. IMPRESSION: Blunting of the left costophrenic angle is likely chronic, although, a trace left pleural effusion can not be excluded. Otherwise, no acute cardiopulmonary findings. Electronically Signed   By: Hart Robinsons M.D.   On: 02/01/2023 13:03   CT Head Wo Contrast  Result Date: 02/01/2023 CLINICAL DATA:   Slid out of wheelchair last night and slid out of bed, head and neck trauma, on blood thinners EXAM: CT HEAD WITHOUT CONTRAST CT CERVICAL SPINE WITHOUT CONTRAST TECHNIQUE: Multidetector CT imaging of the head and cervical spine was performed following the standard protocol without intravenous contrast. Multiplanar CT image reconstructions of the cervical spine were also generated. RADIATION DOSE REDUCTION: This exam was performed according to the departmental dose-optimization program which includes automated exposure control, adjustment of the mA and/or kV according to patient size and/or use of iterative reconstruction technique. COMPARISON:  No prior CT head available, correlation is made with 04/08/2021 MRI head; no prior CT cervical spine available, correlation is made with 08/21/2020  MRI cervical spine FINDINGS: CT HEAD FINDINGS Brain: No evidence of acute infarct, hemorrhage, mass, mass effect, or midline shift. No hydrocephalus or extra-axial fluid collection. Age related cerebral atrophy. Periventricular white matter changes, likely the sequela of chronic small vessel ischemic disease. Vascular: No hyperdense vessel. Atherosclerotic calcifications in the intracranial carotid and vertebral arteries. Skull: Negative for fracture or focal lesion. Sinuses/Orbits: Mucosal thickening in the ethmoid air cells and right maxillary sinus. Status post bilateral lens replacements. Other: The mastoid air cells are well aerated. CT CERVICAL SPINE FINDINGS Alignment: No traumatic listhesis. Skull base and vertebrae: No acute fracture or suspicious osseous lesion. Soft tissues and spinal canal: No prevertebral fluid or swelling. No visible canal hematoma. Disc levels: Degenerative changes in the cervical spine.No high-grade spinal canal stenosis. Upper chest: No focal pulmonary opacity or pleural effusion. IMPRESSION: 1. No acute intracranial process. 2. No acute fracture or traumatic listhesis in the cervical spine.  Electronically Signed   By: Wiliam Ke M.D.   On: 02/01/2023 12:34   CT Cervical Spine Wo Contrast  Result Date: 02/01/2023 CLINICAL DATA:  Slid out of wheelchair last night and slid out of bed, head and neck trauma, on blood thinners EXAM: CT HEAD WITHOUT CONTRAST CT CERVICAL SPINE WITHOUT CONTRAST TECHNIQUE: Multidetector CT imaging of the head and cervical spine was performed following the standard protocol without intravenous contrast. Multiplanar CT image reconstructions of the cervical spine were also generated. RADIATION DOSE REDUCTION: This exam was performed according to the departmental dose-optimization program which includes automated exposure control, adjustment of the mA and/or kV according to patient size and/or use of iterative reconstruction technique. COMPARISON:  No prior CT head available, correlation is made with 04/08/2021 MRI head; no prior CT cervical spine available, correlation is made with 08/21/2020 MRI cervical spine FINDINGS: CT HEAD FINDINGS Brain: No evidence of acute infarct, hemorrhage, mass, mass effect, or midline shift. No hydrocephalus or extra-axial fluid collection. Age related cerebral atrophy. Periventricular white matter changes, likely the sequela of chronic small vessel ischemic disease. Vascular: No hyperdense vessel. Atherosclerotic calcifications in the intracranial carotid and vertebral arteries. Skull: Negative for fracture or focal lesion. Sinuses/Orbits: Mucosal thickening in the ethmoid air cells and right maxillary sinus. Status post bilateral lens replacements. Other: The mastoid air cells are well aerated. CT CERVICAL SPINE FINDINGS Alignment: No traumatic listhesis. Skull base and vertebrae: No acute fracture or suspicious osseous lesion. Soft tissues and spinal canal: No prevertebral fluid or swelling. No visible canal hematoma. Disc levels: Degenerative changes in the cervical spine.No high-grade spinal canal stenosis. Upper chest: No focal pulmonary  opacity or pleural effusion. IMPRESSION: 1. No acute intracranial process. 2. No acute fracture or traumatic listhesis in the cervical spine. Electronically Signed   By: Wiliam Ke M.D.   On: 02/01/2023 12:34     Subjective: Patient seen examined bedside, resting calmly.  Working with physical therapy this morning, walked 200 feet with guard assist with use of rolling walker.  Significant other at bedside, also daughter [name] at bedside.  Long discussion regarding his overall prognosis.  They are working on increasing support/therapy at home and pending outpatient neurology with nerve conduction studies later in December.  Discussed with them extensively that his overall prognosis long-term is poor given his terminal disease of dementia coupled with his comorbidities including multiple myeloma on chemotherapy agents that are likely contributing factors.  Patient with no specific complaints or concerns today and ready for discharge home.  Denies headache, no dizziness, no chest pain,  no palpitations, no shortness of breath, no abdominal pain, no fever/chills/night sweats, no nausea/vomiting/diarrhea, no focal weakness, no fatigue, no paresthesias.  No acute events overnight per nursing staff.  Discharge Exam: Vitals:   02/03/23 0403 02/03/23 0811  BP: (!) 112/54 (!) 147/80  Pulse: 96 94  Resp: 18 18  Temp: 99.3 F (37.4 C) 98.5 F (36.9 C)  SpO2: 95% 98%   Vitals:   02/03/23 0018 02/03/23 0403 02/03/23 0540 02/03/23 0811  BP: 126/74 (!) 112/54  (!) 147/80  Pulse: 87 96  94  Resp: 18 18  18   Temp: 98.7 F (37.1 C) 99.3 F (37.4 C)  98.5 F (36.9 C)  TempSrc: Oral Oral  Oral  SpO2: 97% 95%  98%  Weight:   80.3 kg     Physical Exam: GEN: NAD, alert, oriented to place and person but not time HEENT: NCAT, PERRL, EOMI, sclera clear, MMM PULM: CTAB w/o wheezes/crackles, normal respiratory effort, on room air CV: RRR w/o M/G/R GI: abd soft, NTND, NABS, no R/G/M MSK: no peripheral  edema, muscle strength globally intact 5/5 bilateral upper/lower extremities NEURO: CN II-XII intact, no focal deficits, sensation to light touch intact PSYCH: normal mood/affect Integumentary: dry/intact, no rashes or wounds    The results of significant diagnostics from this hospitalization (including imaging, microbiology, ancillary and laboratory) are listed below for reference.     Microbiology: Recent Results (from the past 240 hour(s))  Resp panel by RT-PCR (RSV, Flu A&B, Covid) Urine, Clean Catch     Status: None   Collection Time: 02/01/23  1:16 PM   Specimen: Urine, Clean Catch; Nasal Swab  Result Value Ref Range Status   SARS Coronavirus 2 by RT PCR NEGATIVE NEGATIVE Final    Comment: (NOTE) SARS-CoV-2 target nucleic acids are NOT DETECTED.  The SARS-CoV-2 RNA is generally detectable in upper respiratory specimens during the acute phase of infection. The lowest concentration of SARS-CoV-2 viral copies this assay can detect is 138 copies/mL. A negative result does not preclude SARS-Cov-2 infection and should not be used as the sole basis for treatment or other patient management decisions. A negative result may occur with  improper specimen collection/handling, submission of specimen other than nasopharyngeal swab, presence of viral mutation(s) within the areas targeted by this assay, and inadequate number of viral copies(<138 copies/mL). A negative result must be combined with clinical observations, patient history, and epidemiological information. The expected result is Negative.  Fact Sheet for Patients:  BloggerCourse.com  Fact Sheet for Healthcare Providers:  SeriousBroker.it  This test is no t yet approved or cleared by the [place] FDA and  has been authorized for detection and/or diagnosis of SARS-CoV-2 by FDA under an Emergency Use Authorization (EUA). This EUA will remain  in effect (meaning this test  can be used) for the duration of the COVID-19 declaration under Section 564(b)(1) of the Act, 21 U.S.C.section 360bbb-3(b)(1), unless the authorization is terminated  or revoked sooner.       Influenza A by PCR NEGATIVE NEGATIVE Final   Influenza B by PCR NEGATIVE NEGATIVE Final    Comment: (NOTE) The Xpert Xpress SARS-CoV-2/FLU/RSV plus assay is intended as an aid in the diagnosis of influenza from Nasopharyngeal swab specimens and should not be used as a sole basis for treatment. Nasal washings and aspirates are unacceptable for Xpert Xpress SARS-CoV-2/FLU/RSV testing.  Fact Sheet for Patients: BloggerCourse.com  Fact Sheet for Healthcare Providers: SeriousBroker.it  This test is not yet approved or cleared by the [place] FDA and has been authorized for detection and/or diagnosis of SARS-CoV-2 by FDA under an Emergency Use Authorization (EUA). This EUA will remain in effect (meaning this test can be used) for the duration of the COVID-19 declaration under Section 564(b)(1) of the Act, 21 U.S.C. section 360bbb-3(b)(1), unless the authorization is terminated or revoked.     Resp Syncytial Virus by PCR NEGATIVE NEGATIVE Final    Comment: (NOTE) Fact Sheet for Patients: BloggerCourse.com  Fact Sheet for Healthcare Providers: SeriousBroker.it  This test is not yet approved or cleared by the [place] FDA and has been authorized for detection and/or diagnosis of SARS-CoV-2 by FDA under an Emergency Use Authorization (EUA). This EUA will remain in effect (meaning this test can be used) for the duration of the COVID-19 declaration under Section 564(b)(1) of the Act, 21 U.S.C. section 360bbb-3(b)(1), unless the authorization is terminated or revoked.  Performed at [facility], [address] [place], [place] [address]   Respiratory (~20  pathogens) panel by PCR     Status: None   Collection Time: 02/02/23 12:34 AM   Specimen: Nasopharyngeal Swab; Respiratory  Result Value Ref Range Status   Adenovirus NOT DETECTED NOT DETECTED Final   Coronavirus 229E NOT DETECTED NOT DETECTED Final    Comment: (NOTE) The Coronavirus on the Respiratory Panel, DOES NOT test for the novel  Coronavirus (2019 nCoV)    Coronavirus HKU1 NOT DETECTED NOT DETECTED Final   Coronavirus NL63 NOT DETECTED NOT DETECTED Final   Coronavirus OC43 NOT DETECTED NOT DETECTED Final   Metapneumovirus NOT DETECTED NOT DETECTED Final   Rhinovirus / Enterovirus NOT DETECTED NOT DETECTED Final   Influenza A NOT DETECTED NOT DETECTED Final   Influenza B NOT DETECTED NOT DETECTED Final   Parainfluenza Virus 1 NOT DETECTED NOT DETECTED Final   Parainfluenza Virus 2 NOT DETECTED NOT DETECTED Final   Parainfluenza Virus 3 NOT DETECTED NOT DETECTED Final   Parainfluenza Virus 4 NOT DETECTED NOT DETECTED Final   Respiratory Syncytial Virus NOT DETECTED NOT DETECTED Final   Bordetella pertussis NOT DETECTED NOT DETECTED Final   Bordetella Parapertussis NOT DETECTED NOT DETECTED Final   Chlamydophila pneumoniae NOT DETECTED NOT DETECTED Final   Mycoplasma pneumoniae NOT DETECTED NOT DETECTED Final    Comment: Performed at [facility], [address], [place], [place] [address]     Labs: BNP (last 3 results) No results for input(s): "BNP" in the last 8760 hours. Basic Metabolic Panel: Recent Labs  Lab 02/01/23 1137 02/01/23 2359 02/02/23 0630 02/03/23 0522  NA 141 139 139 137  K 3.4* 2.8* 2.8* 3.6  CL 102 103 103 108  CO2 28 28 28  21*  GLUCOSE 100* 121* 97 117*  BUN 15 17 13 13   CREATININE 0.91 1.06 1.22 1.17  CALCIUM 9.2 8.4* 8.2* 7.8*  MG  --  2.3  --  1.9  PHOS  --  2.7  --   --    Liver Function Tests: Recent Labs  Lab 02/01/23 1137 02/01/23 2359 02/02/23 0630  AST 26 27 25   ALT 19 22 22   ALKPHOS 45 47 44  BILITOT 1.4* 1.1 1.1   PROT 5.9* 5.2* 5.1*  ALBUMIN 3.8 3.1* 2.8*   No results for input(s): "LIPASE", "AMYLASE" in the last 168 hours. No results for input(s): "AMMONIA" in the last 168 hours. CBC: Recent Labs  Lab 02/01/23 1137 02/01/23 2359 02/02/23 0630  WBC 4.5 4.2 3.9*  NEUTROABS 3.9 3.6  --  HGB 13.0 11.8* 11.7*  HCT 37.9* 35.0* 34.2*  MCV 91.1 89.5 89.8  PLT 104* 111* 101*   Cardiac Enzymes: Recent Labs  Lab 02/01/23 1137 02/02/23 1022 02/03/23 0522  CKTOTAL 706* 306 168   BNP: Invalid input(s): "POCBNP" CBG: No results for input(s): "GLUCAP" in the last 168 hours. D-Dimer No results for input(s): "DDIMER" in the last 72 hours. Hgb A1c No results for input(s): "HGBA1C" in the last 72 hours. Lipid Profile No results for input(s): "CHOL", "HDL", "LDLCALC", "TRIG", "CHOLHDL", "LDLDIRECT" in the last 72 hours. Thyroid function studies Recent Labs    02/01/23 2359  TSH 1.107   Anemia work up No results for input(s): "VITAMINB12", "FOLATE", "FERRITIN", "TIBC", "IRON", "RETICCTPCT" in the last 72 hours. Urinalysis    Component Value Date/Time   COLORURINE YELLOW 02/02/2023 0416   APPEARANCEUR CLEAR 02/02/2023 0416   LABSPEC 1.020 02/02/2023 0416   PHURINE 5.0 02/02/2023 0416   GLUCOSEU NEGATIVE 02/02/2023 0416   HGBUR NEGATIVE 02/02/2023 0416   BILIRUBINUR NEGATIVE 02/02/2023 0416   BILIRUBINUR neg 11/19/2012 0831   KETONESUR 5 (A) 02/02/2023 0416   PROTEINUR NEGATIVE 02/02/2023 0416   UROBILINOGEN 0.2 11/19/2012 0831   NITRITE NEGATIVE 02/02/2023 0416   LEUKOCYTESUR NEGATIVE 02/02/2023 0416   Sepsis Labs Recent Labs  Lab 02/01/23 1137 02/01/23 2359 02/02/23 0630  WBC 4.5 4.2 3.9*   Microbiology Recent Results (from the past 240 hour(s))  Resp panel by RT-PCR (RSV, Flu A&B, Covid) Urine, Clean Catch     Status: None   Collection Time: 02/01/23  1:16 PM   Specimen: Urine, Clean Catch; Nasal Swab  Result Value Ref Range Status   SARS Coronavirus 2 by RT PCR  NEGATIVE NEGATIVE Final    Comment: (NOTE) SARS-CoV-2 target nucleic acids are NOT DETECTED.  The SARS-CoV-2 RNA is generally detectable in upper respiratory specimens during the acute phase of infection. The lowest concentration of SARS-CoV-2 viral copies this assay can detect is 138 copies/mL. A negative result does not preclude SARS-Cov-2 infection and should not be used as the sole basis for treatment or other patient management decisions. A negative result may occur with  improper specimen collection/handling, submission of specimen other than nasopharyngeal swab, presence of viral mutation(s) within the areas targeted by this assay, and inadequate number of viral copies(<138 copies/mL). A negative result must be combined with clinical observations, patient history, and epidemiological information. The expected result is Negative.  Fact Sheet for Patients:  BloggerCourse.com  Fact Sheet for Healthcare Providers:  SeriousBroker.it  This test is no t yet approved or cleared by the [place] FDA and  has been authorized for detection and/or diagnosis of SARS-CoV-2 by FDA under an Emergency Use Authorization (EUA). This EUA will remain  in effect (meaning this test can be used) for the duration of the COVID-19 declaration under Section 564(b)(1) of the Act, 21 U.S.C.section 360bbb-3(b)(1), unless the authorization is terminated  or revoked sooner.       Influenza A by PCR NEGATIVE NEGATIVE Final   Influenza B by PCR NEGATIVE NEGATIVE Final    Comment: (NOTE) The Xpert Xpress SARS-CoV-2/FLU/RSV plus assay is intended as an aid in the diagnosis of influenza from Nasopharyngeal swab specimens and should not be used as a sole basis for treatment. Nasal washings and aspirates are unacceptable for Xpert Xpress SARS-CoV-2/FLU/RSV testing.  Fact Sheet for Patients: BloggerCourse.com  Fact Sheet for  Healthcare Providers: SeriousBroker.it  This test is not yet approved or cleared by the [place] FDA  and has been authorized for detection and/or diagnosis of SARS-CoV-2 by FDA under an Emergency Use Authorization (EUA). This EUA will remain in effect (meaning this test can be used) for the duration of the COVID-19 declaration under Section 564(b)(1) of the Act, 21 U.S.C. section 360bbb-3(b)(1), unless the authorization is terminated or revoked.     Resp Syncytial Virus by PCR NEGATIVE NEGATIVE Final    Comment: (NOTE) Fact Sheet for Patients: BloggerCourse.com  Fact Sheet for Healthcare Providers: SeriousBroker.it  This test is not yet approved or cleared by the [place] FDA and has been authorized for detection and/or diagnosis of SARS-CoV-2 by FDA under an Emergency Use Authorization (EUA). This EUA will remain in effect (meaning this test can be used) for the duration of the COVID-19 declaration under Section 564(b)(1) of the Act, 21 U.S.C. section 360bbb-3(b)(1), unless the authorization is terminated or revoked.  Performed at [facility], [address] [place], [place] [address]   Respiratory (~20 pathogens) panel by PCR     Status: None   Collection Time: 02/02/23 12:34 AM   Specimen: Nasopharyngeal Swab; Respiratory  Result Value Ref Range Status   Adenovirus NOT DETECTED NOT DETECTED Final   Coronavirus 229E NOT DETECTED NOT DETECTED Final    Comment: (NOTE) The Coronavirus on the Respiratory Panel, DOES NOT test for the novel  Coronavirus (2019 nCoV)    Coronavirus HKU1 NOT DETECTED NOT DETECTED Final   Coronavirus NL63 NOT DETECTED NOT DETECTED Final   Coronavirus OC43 NOT DETECTED NOT DETECTED Final   Metapneumovirus NOT DETECTED NOT DETECTED Final   Rhinovirus / Enterovirus NOT DETECTED NOT DETECTED Final   Influenza A NOT DETECTED NOT DETECTED Final    Influenza B NOT DETECTED NOT DETECTED Final   Parainfluenza Virus 1 NOT DETECTED NOT DETECTED Final   Parainfluenza Virus 2 NOT DETECTED NOT DETECTED Final   Parainfluenza Virus 3 NOT DETECTED NOT DETECTED Final   Parainfluenza Virus 4 NOT DETECTED NOT DETECTED Final   Respiratory Syncytial Virus NOT DETECTED NOT DETECTED Final   Bordetella pertussis NOT DETECTED NOT DETECTED Final   Bordetella Parapertussis NOT DETECTED NOT DETECTED Final   Chlamydophila pneumoniae NOT DETECTED NOT DETECTED Final   Mycoplasma pneumoniae NOT DETECTED NOT DETECTED Final    Comment: Performed at [facility], [address] [place], [place] [address]     Time coordinating discharge: Over 30 minutes  SIGNED:   Alvira Philips [place], DO  [facility] 02/03/2023, 1:27 PM

## 2023-02-06 ENCOUNTER — Ambulatory Visit
Admission: RE | Admit: 2023-02-06 | Discharge: 2023-02-06 | Disposition: A | Discharge status: Home / Self Care | Payer: HMO | Source: Ambulatory Visit | Attending: Family Medicine | Admitting: Family Medicine

## 2023-02-06 ENCOUNTER — Other Ambulatory Visit: Payer: Self-pay | Admitting: Family Medicine

## 2023-02-06 DIAGNOSIS — R058 Other specified cough: Secondary | ICD-10-CM

## 2023-02-06 NOTE — Telephone Encounter (Signed)
Is on waitlist.   Noted needs appt.

## 2023-03-06 ENCOUNTER — Telehealth: Payer: Self-pay | Admitting: Radiology

## 2023-03-06 DIAGNOSIS — R29898 Other symptoms and signs involving the musculoskeletal system: Secondary | ICD-10-CM

## 2023-03-06 NOTE — Telephone Encounter (Signed)
 Referral for BLE EMG/NCV located under Media tab from Dr. Geoffery Lyons office.  Please review and advise.

## 2023-03-06 NOTE — Telephone Encounter (Signed)
 I left voicemail for patient to return call to schedule. I called Cordelia Pen, office closed, will try again tomorrow.

## 2023-03-06 NOTE — Addendum Note (Signed)
 Addended by: Rogers Seeds on: 03/06/2023 05:04 PM   Modules accepted: Orders

## 2023-03-06 NOTE — Telephone Encounter (Signed)
 Received voicemail from [place] Davis-MD office-requesting return call in regards to referral for patient who has bilateral lower extremity weakness.   CB for [employer] [phone]

## 2023-03-07 NOTE — Telephone Encounter (Signed)
 I left voicemail at Dr. Magnus office for [facility].   I have tentatively scheduled patient for 03/15/2023 at 0930-this is the first thing available as the study will take an extended amount of time since it is for both lower extremities. I tried to call patient to advise, however, unable to reach him as well. I will try again this morning.

## 2023-03-07 NOTE — Telephone Encounter (Signed)
 I called and spoke with Cordelia Pen at Dr. Geoffery Lyons office. She is trying to reach Mr. Renshaw's partner to advise of time and date for EMG/NCV.  She is aware we have been trying to reach patient as well with no luck.

## 2023-03-15 ENCOUNTER — Ambulatory Visit (INDEPENDENT_AMBULATORY_CARE_PROVIDER_SITE_OTHER): Payer: HMO | Admitting: Physical Medicine and Rehabilitation

## 2023-03-15 DIAGNOSIS — R269 Unspecified abnormalities of gait and mobility: Secondary | ICD-10-CM | POA: Diagnosis not present

## 2023-03-15 DIAGNOSIS — R202 Paresthesia of skin: Secondary | ICD-10-CM | POA: Diagnosis not present

## 2023-03-15 DIAGNOSIS — R531 Weakness: Secondary | ICD-10-CM | POA: Diagnosis not present

## 2023-03-20 NOTE — Procedures (Signed)
EMG & NCV Findings: Evaluation of the left fibular motor and the left tibial motor nerves showed reduced amplitude (L0.6, L0.6 mV).  The right fibular motor nerve showed reduced amplitude (0.4 mV) and decreased conduction velocity (B Fib-Ankle, 34 m/s).  The left superficial fibular sensory nerve showed prolonged distal peak latency (6.1 ms) and decreased conduction velocity (14 cm-Ant Lat Mall, 23 m/s).  The right superficial fibular sensory nerve showed no response (14 cm).  The left sural sensory nerve showed no response (Calf).  The right sural sensory nerve showed no response (Site 2), prolonged distal peak latency (5.5 ms), reduced amplitude (0.9 V), and decreased conduction velocity (Calf-Lat Mall, 25 m/s).  All remaining nerves (as indicated in the following tables) were within normal limits.  Left vs. Right side comparison data for the tibial motor nerve indicates abnormal L-R latency difference (1.6 ms) and abnormal L-R amplitude difference (86.7 %).  All remaining left vs. right side differences were within normal limits.    All examined muscles (as indicated in the following table) showed no evidence of electrical instability.    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  This could be idiopathic polyneuropathy versus possible neuropathy from chemotherapy although I am unsure exactly all the chemotherapy these had.  There is no significant electrodiagnostic evidence of any other focal nerve entrapment, lumbosacral plexopathy or lumbar radiculopathy.   Recommendations: 1.  Follow-up with referring physician.  Continue with physical therapy for strengthening and gait training.  Without much in the way of pain there is really no medication or interventional approach to the neuropathy.  A more thorough study may be done through neurology if felt necessary. 2.   Continue current management of symptoms.  ___________________________ Naaman Plummer FAAPMR Board Certified, American Board of Physical Medicine and Rehabilitation    Nerve Conduction Studies Anti Sensory Summary Table   Stim Site NR Peak (ms) Norm Peak (ms) P-T Amp (V) Norm P-T Amp Site1 Site2 Delta-P (ms) Dist (cm) Vel (m/s) Norm Vel (m/s)  Left Sup Fibular Anti Sensory (Ant Lat Mall)  14 cm    *6.1 <4.4 5.9 >5.0 14 cm Ant Lat Mall 6.1 14.0 *23 >32  Right Sup Fibular Anti Sensory (Ant Lat Mall)  14 cm *NR  <4.4  >5.0 14 cm Ant Lat Mall  14.0  >32  Left Sural Anti Sensory (Lat Mall)  Calf *NR  <4.0  >5.0 Calf Lat Mall  14.0  >35  Right Sural Anti Sensory (Lat Mall)  Calf    *5.5 <4.0 *0.9 >5.0 Calf Lat Mall 5.5 14.0 *25 >35  Site 2 *NR             Motor Summary Table   Stim Site NR Onset (ms) Norm Onset (ms) O-P Amp (mV) Norm O-P Amp Site1 Site2 Delta-0 (ms) Dist (cm) Vel (m/s) Norm Vel (m/s)  Left Fibular Motor (Ext Dig Brev)  Ankle    5.2 <6.1 *0.6 >2.5 B Fib Ankle 8.6 32.5 38 >38  B Fib    13.8  0.3  Poplt B Fib 2.1 10.0 48 >40  Poplt    15.9  0.3         Right Fibular Motor (Ext Dig Brev)  Ankle    6.1 <6.1 *0.4 >2.5 B Fib Ankle 10.0 34.0 *34 >38  B Fib    16.1  0.5  Poplt B Fib 2.3 10.0 43 >40  Poplt  18.4  0.5         Left Tibial Motor (Abd Hall Brev)  Ankle    6.1 <6.1 *0.6 >3.0 Knee Ankle 9.4 38.0 40 >35  Knee    15.5  1.0         Right Tibial Motor (Abd Hall Brev)  Ankle    4.5 <6.1 4.5 >3.0 Knee Ankle 11.1 39.0 35 >35  Knee    15.6  0.4          EMG   Side Muscle Nerve Root Ins Act Fibs Psw Amp Dur Poly Recrt Int Dennie Bible Comment  Left AntTibialis Dp Br Peron L4-5 Nml Nml Nml Nml Nml 0 Nml Nml   Left Fibularis Longus  Sup Br Peron L5-S1 Nml Nml Nml Nml Nml 0 Nml Nml   Left MedGastroc Tibial S1-2 Nml Nml Nml Nml Nml 0 Nml Nml   Left VastusMed Femoral L2-4 Nml Nml Nml Nml Nml 0 Nml Nml   Left BicepsFemS Sciatic L5-S1 Nml Nml Nml Nml Nml 0 Nml Nml   Right  AntTibialis Dp Br Peron L4-5 Nml Nml Nml Nml Nml 0 Nml Nml   Right Fibularis Longus  Sup Br Peron L5-S1 Nml Nml Nml Nml Nml 0 Nml Nml   Right MedGastroc Tibial S1-2 Nml Nml Nml Nml Nml 0 Nml Nml   Right VastusMed Femoral L2-4 Nml Nml Nml Nml Nml 0 Nml Nml   Right BicepsFemS Sciatic L5-S1 Nml Nml Nml Nml Nml 0 Nml Nml     Nerve Conduction Studies Anti Sensory Left/Right Comparison   Stim Site L Lat (ms) R Lat (ms) L-R Lat (ms) L Amp (V) R Amp (V) L-R Amp (%) Site1 Site2 L Vel (m/s) R Vel (m/s) L-R Vel (m/s)  Sup Fibular Anti Sensory (Ant Lat Mall)  14 cm *6.1   5.9   14 cm Ant Lat Mall *23    Sural Anti Sensory (Lat Mall)  Calf  *5.5   *0.9  Calf Lat Mall  25    Motor Left/Right Comparison   Stim Site L Lat (ms) R Lat (ms) L-R Lat (ms) L Amp (mV) R Amp (mV) L-R Amp (%) Site1 Site2 L Vel (m/s) R Vel (m/s) L-R Vel (m/s)  Fibular Motor (Ext Dig Brev)  Ankle 5.2 6.1 0.9 *0.6 *0.4 33.3 B Fib Ankle 38 *34 4  B Fib 13.8 16.1 2.3 0.3 0.5 40.0 Poplt B Fib 48 43 5  Poplt 15.9 18.4 2.5 0.3 0.5 40.0       Tibial Motor (Abd Hall Brev)  Ankle 6.1 4.5 *1.6 *0.6 4.5 *86.7 Knee Ankle 40 35 5  Knee 15.5 15.6 0.1 1.0 0.4 60.0          Waveforms:

## 2023-03-24 ENCOUNTER — Other Ambulatory Visit: Payer: Self-pay | Admitting: Family Medicine

## 2023-03-24 ENCOUNTER — Ambulatory Visit
Admission: RE | Admit: 2023-03-24 | Discharge: 2023-03-24 | Disposition: A | Discharge status: Home / Self Care | Payer: HMO | Source: Ambulatory Visit | Attending: Family Medicine | Admitting: Family Medicine

## 2023-03-24 DIAGNOSIS — R053 Chronic cough: Secondary | ICD-10-CM

## 2023-03-29 ENCOUNTER — Encounter: Payer: Self-pay | Admitting: Physical Medicine and Rehabilitation

## 2023-03-29 NOTE — Progress Notes (Signed)
[name]. - 83 y.o. male MRN [id number]  Date of birth: 1940/07/12  Office Visit Note: Visit Date: 03/15/2023 PCP: Mosetta Putt, MD Referred by: Mosetta Putt, MD  Subjective: Chief Complaint  Patient presents with   Right Leg - Pain   Left Leg - Pain   HPI: [name]. is a 83 y.o. male who comes in today for evaluation and management at the request of Dr. Mosetta Putt for worsening generalized weakness in the bilateral lower extremities over the last several years.  The patient is present today with his wife who provides a lot of the history.  The patient is able to give history and does fairly well.  He has had a history of dementia somewhat progressive over the last couple years with medical history complicated by COPD, gait abnormality with ataxia, multiple myeloma and chronic back pain.  He does not really endorse much in the way of paresthesias down the legs although occasionally he does get some tingling.  He has had no frank radicular symptoms from the spine down the legs with ongoing pain.  He has noticed generalized weakness with ambulation with seemingly heavy feeling to hip flexion and mobilization.  He has not had a foot drop or focal weakness.  He has had no history of lumbar spine surgery.  He has had MRIs of the spine and brain really without acute findings.  I reviewed his lumbar spine MRI imaging and the report is listed below.  There is no high-grade central stenosis or foraminal narrowing or nerve entrapment.  He does have some lateral recess narrowing at a couple levels but this would not typically be anything that would cause generalized weakness.  He has noted that he exercises quite a bit during the week using a stationary bike and he has had some therapy in the house but he continues to have some problems with ambulation and again this generalized weakness.  He does have some back pain.  He has not had prior electrodiagnostic studies.  He has been  followed in the past by Dr. Lucia Gaskins at [facility].  He is not diabetic and does not have thyroid disease.  He has had chemotherapy and undergoing therapy for his multiple myeloma.  They have not noted any increased spasticity but some balance disturbance.   I spent more than 30 minutes speaking face-to-face with the patient with 50% of the time in counseling and discussing coordination of care.      Review of Systems  Respiratory:  Positive for shortness of breath.   Musculoskeletal:  Positive for back pain and joint pain.  Neurological:  Positive for tingling and weakness.  All other systems reviewed and are negative.  Otherwise per HPI.  Assessment & Plan: Visit Diagnoses:    ICD-10-CM   1. Paresthesia of skin  R20.2 NCV with EMG (electromyography)    2. Weakness  R53.1     3. Gait disorder  R26.9        Plan: Impression: The weakness he describes from a clinical standpoint is fairly generalized and not focal.  This could be from deconditioning and just age-related findings versus possibly from the chemotherapy that he has had and multiple myeloma in general.  Electrodiagnostic study completed today.  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  This could be idiopathic polyneuropathy versus possible neuropathy from chemotherapy although I am unsure exactly all  the chemotherapy these had.  There is no significant electrodiagnostic evidence of any other focal nerve entrapment, lumbosacral plexopathy or lumbar radiculopathy.   Recommendations: 1.  Follow-up with referring physician.  Continue with physical therapy for strengthening and gait training.  Without much in the way of pain there is really no medication or interventional approach to the neuropathy.  A more thorough study may be done through neurology if felt necessary. 2.  Continue current  management of symptoms.  Meds & Orders: No orders of the defined types were placed in this encounter.   Orders Placed This Encounter  Procedures   NCV with EMG (electromyography)    Follow-up: Return for Mosetta Putt, MD.   Procedures: No procedures performed  EMG & NCV Findings: Evaluation of the left fibular motor and the left tibial motor nerves showed reduced amplitude (L0.6, L0.6 mV).  The right fibular motor nerve showed reduced amplitude (0.4 mV) and decreased conduction velocity (B Fib-Ankle, 34 m/s).  The left superficial fibular sensory nerve showed prolonged distal peak latency (6.1 ms) and decreased conduction velocity (14 cm-Ant Lat Mall, 23 m/s).  The right superficial fibular sensory nerve showed no response (14 cm).  The left sural sensory nerve showed no response (Calf).  The right sural sensory nerve showed no response (Site 2), prolonged distal peak latency (5.5 ms), reduced amplitude (0.9 V), and decreased conduction velocity (Calf-Lat Mall, 25 m/s).  All remaining nerves (as indicated in the following tables) were within normal limits.  Left vs. Right side comparison data for the tibial motor nerve indicates abnormal L-R latency difference (1.6 ms) and abnormal L-R amplitude difference (86.7 %).  All remaining left vs. right side differences were within normal limits.    All examined muscles (as indicated in the following table) showed no evidence of electrical instability.    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  This could be idiopathic polyneuropathy versus possible neuropathy from chemotherapy although I am unsure exactly all the chemotherapy these had.  There is no significant electrodiagnostic evidence of any other focal nerve entrapment, lumbosacral plexopathy or lumbar radiculopathy.   Recommendations: 1.  Follow-up with  referring physician.  Continue with physical therapy for strengthening and gait training.  Without much in the way of pain there is really no medication or interventional approach to the neuropathy.  A more thorough study may be done through neurology if felt necessary. 2.  Continue current management of symptoms.  ___________________________ Naaman Plummer FAAPMR Board Certified, American Board of Physical Medicine and Rehabilitation    Nerve Conduction Studies Anti Sensory Summary Table   Stim Site NR Peak (ms) Norm Peak (ms) P-T Amp (V) Norm P-T Amp Site1 Site2 Delta-P (ms) Dist (cm) Vel (m/s) Norm Vel (m/s)  Left Sup Fibular Anti Sensory (Ant Lat Mall)  14 cm    *6.1 <4.4 5.9 >5.0 14 cm Ant Lat Mall 6.1 14.0 *23 >32  Right Sup Fibular Anti Sensory (Ant Lat Mall)  14 cm *NR  <4.4  >5.0 14 cm Ant Lat Mall  14.0  >32  Left Sural Anti Sensory (Lat Mall)  Calf *NR  <4.0  >5.0 Calf Lat Mall  14.0  >35  Right Sural Anti Sensory (Lat Mall)  Calf    *5.5 <4.0 *0.9 >5.0 Calf Lat Mall 5.5 14.0 *25 >35  Site 2 *NR             Motor  Summary Table   Stim Site NR Onset (ms) Norm Onset (ms) O-P Amp (mV) Norm O-P Amp Site1 Site2 Delta-0 (ms) Dist (cm) Vel (m/s) Norm Vel (m/s)  Left Fibular Motor (Ext Dig Brev)  Ankle    5.2 <6.1 *0.6 >2.5 B Fib Ankle 8.6 32.5 38 >38  B Fib    13.8  0.3  Poplt B Fib 2.1 10.0 48 >40  Poplt    15.9  0.3         Right Fibular Motor (Ext Dig Brev)  Ankle    6.1 <6.1 *0.4 >2.5 B Fib Ankle 10.0 34.0 *34 >38  B Fib    16.1  0.5  Poplt B Fib 2.3 10.0 43 >40  Poplt    18.4  0.5         Left Tibial Motor (Abd Hall Brev)  Ankle    6.1 <6.1 *0.6 >3.0 Knee Ankle 9.4 38.0 40 >35  Knee    15.5  1.0         Right Tibial Motor (Abd Hall Brev)  Ankle    4.5 <6.1 4.5 >3.0 Knee Ankle 11.1 39.0 35 >35  Knee    15.6  0.4          EMG   Side Muscle Nerve Root Ins Act Fibs Psw Amp Dur Poly Recrt Int Dennie Bible Comment  Left AntTibialis Dp Br Peron L4-5 Nml Nml Nml Nml Nml 0 Nml Nml    Left Fibularis Longus  Sup Br Peron L5-S1 Nml Nml Nml Nml Nml 0 Nml Nml   Left MedGastroc Tibial S1-2 Nml Nml Nml Nml Nml 0 Nml Nml   Left VastusMed Femoral L2-4 Nml Nml Nml Nml Nml 0 Nml Nml   Left BicepsFemS Sciatic L5-S1 Nml Nml Nml Nml Nml 0 Nml Nml   Right AntTibialis Dp Br Peron L4-5 Nml Nml Nml Nml Nml 0 Nml Nml   Right Fibularis Longus  Sup Br Peron L5-S1 Nml Nml Nml Nml Nml 0 Nml Nml   Right MedGastroc Tibial S1-2 Nml Nml Nml Nml Nml 0 Nml Nml   Right VastusMed Femoral L2-4 Nml Nml Nml Nml Nml 0 Nml Nml   Right BicepsFemS Sciatic L5-S1 Nml Nml Nml Nml Nml 0 Nml Nml     Nerve Conduction Studies Anti Sensory Left/Right Comparison   Stim Site L Lat (ms) R Lat (ms) L-R Lat (ms) L Amp (V) R Amp (V) L-R Amp (%) Site1 Site2 L Vel (m/s) R Vel (m/s) L-R Vel (m/s)  Sup Fibular Anti Sensory (Ant Lat Mall)  14 cm *6.1   5.9   14 cm Ant Lat Mall *23    Sural Anti Sensory (Lat Mall)  Calf  *5.5   *0.9  Calf Lat Mall  25    Motor Left/Right Comparison   Stim Site L Lat (ms) R Lat (ms) L-R Lat (ms) L Amp (mV) R Amp (mV) L-R Amp (%) Site1 Site2 L Vel (m/s) R Vel (m/s) L-R Vel (m/s)  Fibular Motor (Ext Dig Brev)  Ankle 5.2 6.1 0.9 *0.6 *0.4 33.3 B Fib Ankle 38 *34 4  B Fib 13.8 16.1 2.3 0.3 0.5 40.0 Poplt B Fib 48 43 5  Poplt 15.9 18.4 2.5 0.3 0.5 40.0       Tibial Motor (Abd Hall Brev)  Ankle 6.1 4.5 *1.6 *0.6 4.5 *86.7 Knee Ankle 40 35 5  Knee 15.5 15.6 0.1 1.0 0.4 60.0          Waveforms:  Clinical History: MRI LUMBAR SPINE WITHOUT AND WITH CONTRAST   TECHNIQUE: Multiplanar and multiecho pulse sequences of the lumbar spine were obtained without and with intravenous contrast.   CONTRAST:  7.5 cc Vueway   COMPARISON:  Lumbar spine radiographs 12/07/2022, CT abdomen/pelvis 10/07/2011   FINDINGS: Segmentation: Standard; the lowest formed disc space is designated L5-S1.   Alignment: There is mild dextrocurvature centered at L2-L3 and grade 1  anterolisthesis of L4 on L5.   Vertebrae: Vertebral body heights are preserved, without evidence of acute fracture. There is heterogeneous STIR signal abnormality and enhancement in the right aspect of the L4 vertebral body which corresponds to mixed sclerosis and lucency on the remote CT from 2019, favored benign. There are intraosseous hemangiomas in the L3, L4, and L5 vertebral bodies. There is no other abnormal marrow edema or enhancement.   Conus medullaris and cauda equina: Conus extends to the L1 level. Conus and cauda equina appear normal.   Paraspinal and other soft tissues: Paraspinal there is a 2.1 cm T2 hypointense, heterogeneously T1 hyperintense lesion in the right upper pole corresponding to a previously ablated lesion, described on outside hospital renal ultrasounds and PET-CT. There is a 2.0 cm cystic structure in the retroperitoneum abutting the left posterolateral aspect of the aorta which is unchanged since 2013 and may reflect a benign lymphocele. The paraspinal soft tissues are unremarkable.   Disc levels:   There is overall mild-to-moderate disc desiccation and narrowing throughout the lumbar spine.   T12-L1: No significant spinal canal or neural foraminal stenosis.   L1-L2: There is a diffuse disc bulge and mild facet arthropathy resulting in mild left worse than right neural foraminal stenosis without significant spinal canal stenosis.   L2-L3: There is a diffuse disc bulge and mild-to-moderate left worse than right facet arthropathy with a prominent medially projecting left-sided spur resulting in left subarticular zone narrowing which may affect the traversing L3 nerve root (105-29), and mild left and no significant right neural foraminal stenosis.   L3-L4: There is a disc bulge and mild-to-moderate left worse than right facet arthropathy resulting in left subarticular zone narrowing which may affect the traversing left L4 nerve root, and  no significant neural foraminal stenosis.   L4-L5: There is a disc bulge and moderate bilateral facet arthropathy resulting in mild-to-moderate right worse than left neural foraminal stenosis without significant spinal canal stenosis.   L5-S1: There is grade 1 anterolisthesis with a left-sided pars defect, moderate right worse than left facet arthropathy with a small posterior projecting left-sided synovial cyst,, and a mild disc bulge but no significant spinal canal or neural foraminal stenosis.   IMPRESSION: 1. Multilevel disc and facet degeneration detailed above resulting in left subarticular zone narrowing at L2-L3 and L3-L4 which may affect the traversing nerve roots, and mild-to-moderate right worse than left neural foraminal stenosis at L4-L5. 2. Grade 1 anterolisthesis of L4 on L5 with a left-sided pars defect and moderate right worse than left facet arthropathy but no significant spinal canal or neural foraminal stenosis. 3. Heterogeneous signal abnormality and enhancement in the L4 vertebral body corresponds to mixed sclerosis and lucency seen on the remote CT from 2013, favored benign given stability. 4. 2.1 cm right upper pole renal lesion has been evaluated on outside hospital PET-CT and renal ultrasounds, likely corresponding to a previously ablated lesion. Recommend correlation with treatment history and outside imaging.     Electronically Signed   By: Lesia Hausen M.D.   On: 01/02/2023 09:31  He reports that he quit smoking about 43 years ago. His smoking use included cigars. He has been exposed to tobacco smoke. He has never used smokeless tobacco. No results for input(s): "HGBA1C", "LABURIC" in the last 8760 hours.  Objective:  VS:  HT:    WT:   BMI:     BP:   HR: bpm  TEMP: ( )  RESP:  Physical Exam Vitals and nursing note reviewed.  Constitutional:      General: He is not in acute distress.    Appearance: Normal appearance. He is well-developed.   HENT:     Head: Normocephalic and atraumatic.  Eyes:     Conjunctiva/sclera: Conjunctivae normal.     Pupils: Pupils are equal, round, and reactive to light.  Cardiovascular:     Rate and Rhythm: Normal rate.     Pulses: Normal pulses.     Heart sounds: Normal heart sounds.  Pulmonary:     Effort: Pulmonary effort is normal. No respiratory distress.  Musculoskeletal:        General: Tenderness present.     Cervical back: Normal range of motion and neck supple. Tenderness present. No rigidity.     Right lower leg: No edema.     Left lower leg: No edema.     Comments: He is somewhat slow to rise from a seated position but he does have good strength on manual muscle testing of the hip flexors knee extension flexion and dorsiflexion plantarflexion EHL.  Again inspection shows atrophy of the intrinsic foot musculature bilaterally.  He has no pain or Tinel's sign across the fibular head bilaterally.  He has no pain with hip rotation.  Skin:    General: Skin is warm and dry.     Findings: No erythema or rash.  Neurological:     General: No focal deficit present.     Mental Status: He is alert and oriented to person, place, and time.     Cranial Nerves: No cranial nerve deficit.     Sensory: No sensory deficit.     Motor: No weakness.     Coordination: Coordination normal.     Gait: Gait abnormal.     Comments: Inspection of both feet show atrophy of the intrinsic foot musculature which can be normal and aging.  There is no discoloration or swelling or allodynia.  There is what he feels normal sensation to light touch bilaterally.  Symmetric.  There is no clonus.  Negative Babinski.  Psychiatric:        Mood and Affect: Mood normal.        Behavior: Behavior normal.     Ortho Exam  Imaging: No results found.  Past Medical/Family/Surgical/Social History: Medications & Allergies reviewed per EMR, new medications updated. 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   Pain due to onychomycosis of toenails of both feet 08/15/2018   Dermatitis associated with moisture from stool incontinence 08/14/2018   Gastric polyp    Incontinence of feces 08/19/2017   AAA (abdominal aortic aneurysm) (HCC) 06/30/2017   Anxiety 06/30/2017   Pancreatic cyst  06/07/2017   Anosmia 02/25/2016   Chronic pansinusitis 02/25/2016   Impacted cerumen of both ears 02/25/2016   Noise effect on both inner ears 02/25/2016   Presbycusis of both ears 02/25/2016   Duodenal nodule    Renal cell carcinoma of left kidney (HCC) 11/11/2013   BPH (benign prostatic hyperplasia) 11/07/2011   Hip pain 07/04/2011   Perianal itch 07/04/2011   ED (erectile dysfunction) of organic origin 03/07/2011   OSA on CPAP 08/08/2010   Chronic obstructive asthma (HCC) 07/14/2010   Rhinitis 07/14/2010   Cough 07/13/2010   Barrett's esophagus 03/11/2009   Atrial fibrillation (HCC) 07/29/2008   GERD 07/29/2008   Past Medical History:  Diagnosis Date   Allergy    Arrhythmia    Atrial fibrillation (HCC)    Barrett esophagus    Barrett's esophagus    Blood transfusion without reported diagnosis    BPH (benign prostatic hyperplasia)    Cervical stenosis of spine    Chronic obstructive asthma    Dementia (HCC)    Difficult intubation 13 or 14 years ago, at [facility] long   airway was up near nostrils with intubation per patient with bowel surgery   ED (erectile dysfunction)    GERD (gastroesophageal reflux disease)    Hammer toe of right foot    Hiatal hernia    Hyperlipidemia    Hypertension    OSA  (obstructive sleep apnea) 08/05/10 [facility]   AHI 24   Paroxysmal atrial fibrillation (HCC)    Hospitalized on 05/27/10   Renal cell carcinoma 2009   right renal mass, followed by Dr. Teodora Medici   Sleep apnea    Family History  Problem Relation Age of Onset   Stroke Mother    Diabetes Mother    Diabetes Father    Obesity Brother    Diabetes Brother    Kidney cancer Paternal Aunt    Asthma Paternal Grandmother    Heart attack Paternal Grandfather    Colon cancer Neg Hx    Esophageal cancer Neg Hx    Rectal cancer Neg Hx    Stomach cancer Neg Hx    Dementia Neg Hx    Alzheimer's disease Neg Hx    Past Surgical History:  Procedure Laterality Date   BASAL CELL CARCINOMA EXCISION     BIOPSY  12/18/2017   Procedure: BIOPSY;  Surgeon: Meryl Dare, MD;  Location: [facility];  Service: Endoscopy;;   ESOPHAGOGASTRODUODENOSCOPY (EGD) WITH PROPOFOL N/A 08/26/2014   Procedure: ESOPHAGOGASTRODUODENOSCOPY (EGD) WITH PROPOFOL;  Surgeon: Meryl Dare, MD;  Location: [facility];  Service: Endoscopy;  Laterality: N/A;   ESOPHAGOGASTRODUODENOSCOPY (EGD) WITH PROPOFOL N/A 12/18/2017   Procedure: ESOPHAGOGASTRODUODENOSCOPY (EGD) WITH PROPOFOL;  Surgeon: Meryl Dare, MD;  Location: [facility];  Service: Endoscopy;  Laterality: N/A;   EXPLORATORY LAPAROTOMY W/ BOWEL RESECTION  12/1999   segmental sbr of a hemangioma, repair of incarcerated umbilical hernia   exploratory laparotomy, segmental small bowel resection of a hemangioma, repair of incarcerated umbelical hernia  12/1999   EYE SURGERY     cataracts done   renal cancer surgery with RF Ablation     TONSILLECTOMY     Social History   Occupational History   Occupation: tv producer/construction   Occupation: Radio broadcast assistant  Tobacco Use   Smoking status: Former    Types: Cigars    Quit date: 02/29/1980    Years since quitting: 43.1    Passive exposure: Past   Smokeless tobacco: Never   Tobacco  comments:    quit when he was  83 years old  Vaping Use   Vaping status: Never Used  Substance and Sexual Activity   Alcohol use: No   Drug use: No   Sexual activity: Not Currently

## 2023-04-12 ENCOUNTER — Ambulatory Visit: Payer: HMO | Admitting: Podiatry

## 2023-04-13 ENCOUNTER — Encounter: Payer: Self-pay | Admitting: Podiatry

## 2023-04-13 ENCOUNTER — Ambulatory Visit: Payer: HMO | Admitting: Podiatry

## 2023-04-13 DIAGNOSIS — D689 Coagulation defect, unspecified: Secondary | ICD-10-CM | POA: Diagnosis not present

## 2023-04-13 DIAGNOSIS — M79674 Pain in right toe(s): Secondary | ICD-10-CM | POA: Diagnosis not present

## 2023-04-13 DIAGNOSIS — M79675 Pain in left toe(s): Secondary | ICD-10-CM | POA: Diagnosis not present

## 2023-04-13 DIAGNOSIS — B351 Tinea unguium: Secondary | ICD-10-CM

## 2023-04-13 NOTE — Progress Notes (Signed)
This patient returns to my office for at risk foot care.  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  Patient is taking pradaxa.This patient is unable to cut nails himself since the patient cannot reach his nails.These nails are painful walking and wearing shoes.  This patient presents for at risk foot care today.  General Appearance  Alert, conversant and in no acute stress.  Vascular  Dorsalis pedis  are weakly  palpable  bilaterally. Posterior tibial pulses are absent  Bilateral. Capillary return is within normal limits  Bilateral.  Cold feet.  Bilaterally. Absent hair noted.  Neurologic  Senn-Weinstein monofilament wire test within normal limits  bilaterally. Muscle power within normal limits bilaterally.  Nails Thick disfigured discolored nails with subungual debris  from hallux to fifth toes bilaterally. No evidence of bacterial infection or drainage bilaterally.  Orthopedic  No limitations of motion  feet .  No crepitus or effusions noted.  No bony pathology or digital deformities noted.  Hammer toes 2,3 right foot.  Skin  normotropic skin with no porokeratosis noted bilaterally.  No signs of infections or ulcers noted.  Asymptomatic diffuse callus right forefoot.   Onychomycosis  Pain in right toes  Pain in left toes  Consent was obtained for treatment procedures.   Mechanical debridement of nails 1-5  bilaterally performed with a nail nipper.  Filed with dremel without incident.    Return office visit   3 months.                  Told patient to return for periodic foot care and evaluation due to potential at risk complications.   Helane Gunther DPM

## 2023-05-01 ENCOUNTER — Other Ambulatory Visit: Payer: Self-pay

## 2023-05-01 ENCOUNTER — Inpatient Hospital Stay (HOSPITAL_COMMUNITY)

## 2023-05-01 ENCOUNTER — Encounter (HOSPITAL_COMMUNITY): Payer: Self-pay | Admitting: Critical Care Medicine

## 2023-05-01 ENCOUNTER — Emergency Department (HOSPITAL_COMMUNITY)

## 2023-05-01 ENCOUNTER — Inpatient Hospital Stay (HOSPITAL_COMMUNITY)
Admission: EM | Admit: 2023-05-01 | Discharge: 2023-05-09 | DRG: 871 | Disposition: A | Discharge status: Home Health | Attending: Internal Medicine | Admitting: Internal Medicine

## 2023-05-01 DIAGNOSIS — N400 Enlarged prostate without lower urinary tract symptoms: Secondary | ICD-10-CM | POA: Diagnosis not present

## 2023-05-01 DIAGNOSIS — I4891 Unspecified atrial fibrillation: Secondary | ICD-10-CM

## 2023-05-01 DIAGNOSIS — G9341 Metabolic encephalopathy: Secondary | ICD-10-CM | POA: Diagnosis present

## 2023-05-01 DIAGNOSIS — J9601 Acute respiratory failure with hypoxia: Secondary | ICD-10-CM | POA: Diagnosis present

## 2023-05-01 DIAGNOSIS — J45909 Unspecified asthma, uncomplicated: Secondary | ICD-10-CM | POA: Diagnosis present

## 2023-05-01 DIAGNOSIS — Z85528 Personal history of other malignant neoplasm of kidney: Secondary | ICD-10-CM

## 2023-05-01 DIAGNOSIS — Z1152 Encounter for screening for COVID-19: Secondary | ICD-10-CM

## 2023-05-01 DIAGNOSIS — Z888 Allergy status to other drugs, medicaments and biological substances status: Secondary | ICD-10-CM

## 2023-05-01 DIAGNOSIS — Z833 Family history of diabetes mellitus: Secondary | ICD-10-CM

## 2023-05-01 DIAGNOSIS — Z7951 Long term (current) use of inhaled steroids: Secondary | ICD-10-CM

## 2023-05-01 DIAGNOSIS — G9340 Encephalopathy, unspecified: Secondary | ICD-10-CM

## 2023-05-01 DIAGNOSIS — Z9911 Dependence on respirator [ventilator] status: Secondary | ICD-10-CM

## 2023-05-01 DIAGNOSIS — E785 Hyperlipidemia, unspecified: Secondary | ICD-10-CM | POA: Diagnosis present

## 2023-05-01 DIAGNOSIS — R319 Hematuria, unspecified: Secondary | ICD-10-CM | POA: Diagnosis not present

## 2023-05-01 DIAGNOSIS — F03B0 Unspecified dementia, moderate, without behavioral disturbance, psychotic disturbance, mood disturbance, and anxiety: Secondary | ICD-10-CM | POA: Diagnosis present

## 2023-05-01 DIAGNOSIS — Z87891 Personal history of nicotine dependence: Secondary | ICD-10-CM

## 2023-05-01 DIAGNOSIS — R2689 Other abnormalities of gait and mobility: Secondary | ICD-10-CM | POA: Diagnosis present

## 2023-05-01 DIAGNOSIS — K219 Gastro-esophageal reflux disease without esophagitis: Secondary | ICD-10-CM | POA: Diagnosis present

## 2023-05-01 DIAGNOSIS — K2270 Barrett's esophagus without dysplasia: Secondary | ICD-10-CM | POA: Diagnosis present

## 2023-05-01 DIAGNOSIS — R739 Hyperglycemia, unspecified: Secondary | ICD-10-CM | POA: Diagnosis not present

## 2023-05-01 DIAGNOSIS — C9000 Multiple myeloma not having achieved remission: Secondary | ICD-10-CM | POA: Diagnosis present

## 2023-05-01 DIAGNOSIS — I129 Hypertensive chronic kidney disease with stage 1 through stage 4 chronic kidney disease, or unspecified chronic kidney disease: Secondary | ICD-10-CM | POA: Diagnosis present

## 2023-05-01 DIAGNOSIS — Z8051 Family history of malignant neoplasm of kidney: Secondary | ICD-10-CM

## 2023-05-01 DIAGNOSIS — Z79899 Other long term (current) drug therapy: Secondary | ICD-10-CM

## 2023-05-01 DIAGNOSIS — Z825 Family history of asthma and other chronic lower respiratory diseases: Secondary | ICD-10-CM

## 2023-05-01 DIAGNOSIS — J09X1 Influenza due to identified novel influenza A virus with pneumonia: Secondary | ICD-10-CM | POA: Diagnosis not present

## 2023-05-01 DIAGNOSIS — D849 Immunodeficiency, unspecified: Secondary | ICD-10-CM | POA: Diagnosis present

## 2023-05-01 DIAGNOSIS — E871 Hypo-osmolality and hyponatremia: Secondary | ICD-10-CM | POA: Diagnosis not present

## 2023-05-01 DIAGNOSIS — N39498 Other specified urinary incontinence: Secondary | ICD-10-CM | POA: Diagnosis present

## 2023-05-01 DIAGNOSIS — D696 Thrombocytopenia, unspecified: Secondary | ICD-10-CM | POA: Diagnosis not present

## 2023-05-01 DIAGNOSIS — I714 Abdominal aortic aneurysm, without rupture, unspecified: Secondary | ICD-10-CM | POA: Diagnosis present

## 2023-05-01 DIAGNOSIS — E872 Acidosis, unspecified: Secondary | ICD-10-CM | POA: Diagnosis present

## 2023-05-01 DIAGNOSIS — J09X2 Influenza due to identified novel influenza A virus with other respiratory manifestations: Secondary | ICD-10-CM | POA: Diagnosis not present

## 2023-05-01 DIAGNOSIS — Y848 Other medical procedures as the cause of abnormal reaction of the patient, or of later complication, without mention of misadventure at the time of the procedure: Secondary | ICD-10-CM | POA: Diagnosis not present

## 2023-05-01 DIAGNOSIS — N401 Enlarged prostate with lower urinary tract symptoms: Secondary | ICD-10-CM | POA: Diagnosis present

## 2023-05-01 DIAGNOSIS — A419 Sepsis, unspecified organism: Secondary | ICD-10-CM

## 2023-05-01 DIAGNOSIS — E8729 Other acidosis: Secondary | ICD-10-CM | POA: Diagnosis not present

## 2023-05-01 DIAGNOSIS — G4733 Obstructive sleep apnea (adult) (pediatric): Secondary | ICD-10-CM | POA: Diagnosis present

## 2023-05-01 DIAGNOSIS — R310 Gross hematuria: Secondary | ICD-10-CM | POA: Diagnosis not present

## 2023-05-01 DIAGNOSIS — I482 Chronic atrial fibrillation, unspecified: Secondary | ICD-10-CM | POA: Diagnosis present

## 2023-05-01 DIAGNOSIS — D649 Anemia, unspecified: Secondary | ICD-10-CM | POA: Diagnosis not present

## 2023-05-01 DIAGNOSIS — A4189 Other specified sepsis: Principal | ICD-10-CM | POA: Diagnosis present

## 2023-05-01 DIAGNOSIS — Z85828 Personal history of other malignant neoplasm of skin: Secondary | ICD-10-CM

## 2023-05-01 DIAGNOSIS — R5381 Other malaise: Secondary | ICD-10-CM

## 2023-05-01 DIAGNOSIS — E876 Hypokalemia: Secondary | ICD-10-CM | POA: Diagnosis not present

## 2023-05-01 DIAGNOSIS — N1831 Chronic kidney disease, stage 3a: Secondary | ICD-10-CM | POA: Diagnosis present

## 2023-05-01 DIAGNOSIS — J101 Influenza due to other identified influenza virus with other respiratory manifestations: Secondary | ICD-10-CM | POA: Diagnosis present

## 2023-05-01 DIAGNOSIS — J690 Pneumonitis due to inhalation of food and vomit: Secondary | ICD-10-CM | POA: Diagnosis present

## 2023-05-01 DIAGNOSIS — Z8249 Family history of ischemic heart disease and other diseases of the circulatory system: Secondary | ICD-10-CM

## 2023-05-01 DIAGNOSIS — Z823 Family history of stroke: Secondary | ICD-10-CM

## 2023-05-01 DIAGNOSIS — T8383XA Hemorrhage of genitourinary prosthetic devices, implants and grafts, initial encounter: Secondary | ICD-10-CM | POA: Diagnosis not present

## 2023-05-01 DIAGNOSIS — Z7902 Long term (current) use of antithrombotics/antiplatelets: Secondary | ICD-10-CM

## 2023-05-01 DIAGNOSIS — R4182 Altered mental status, unspecified: Secondary | ICD-10-CM | POA: Diagnosis present

## 2023-05-01 DIAGNOSIS — R6521 Severe sepsis with septic shock: Secondary | ICD-10-CM | POA: Diagnosis present

## 2023-05-01 LAB — CBC WITH DIFFERENTIAL/PLATELET
Abs Immature Granulocytes: 0.01 10*3/uL (ref 0.00–0.07)
Basophils Absolute: 0 10*3/uL (ref 0.0–0.1)
Basophils Relative: 0 %
Eosinophils Absolute: 0 10*3/uL (ref 0.0–0.5)
Eosinophils Relative: 0 %
HCT: 38.5 % — ABNORMAL LOW (ref 39.0–52.0)
Hemoglobin: 13.3 g/dL (ref 13.0–17.0)
Immature Granulocytes: 0 %
Lymphocytes Relative: 1 %
Lymphs Abs: 0.1 10*3/uL — ABNORMAL LOW (ref 0.7–4.0)
MCH: 32.4 pg (ref 26.0–34.0)
MCHC: 34.5 g/dL (ref 30.0–36.0)
MCV: 93.9 fL (ref 80.0–100.0)
Monocytes Absolute: 0.3 10*3/uL (ref 0.1–1.0)
Monocytes Relative: 4 %
Neutro Abs: 5.7 10*3/uL (ref 1.7–7.7)
Neutrophils Relative %: 95 %
Platelets: 100 10*3/uL — ABNORMAL LOW (ref 150–400)
RBC: 4.1 MIL/uL — ABNORMAL LOW (ref 4.22–5.81)
RDW: 15.9 % — ABNORMAL HIGH (ref 11.5–15.5)
WBC: 6.1 10*3/uL (ref 4.0–10.5)
nRBC: 0 % (ref 0.0–0.2)

## 2023-05-01 LAB — GLUCOSE, CAPILLARY
Glucose-Capillary: 190 mg/dL — ABNORMAL HIGH (ref 70–99)
Glucose-Capillary: 158 mg/dL — ABNORMAL HIGH (ref 70–99)
Glucose-Capillary: 139 mg/dL — ABNORMAL HIGH (ref 70–99)
Glucose-Capillary: 115 mg/dL — ABNORMAL HIGH (ref 70–99)
Glucose-Capillary: 106 mg/dL — ABNORMAL HIGH (ref 70–99)

## 2023-05-01 LAB — I-STAT ARTERIAL BLOOD GAS, ED
Acid-Base Excess: 0 mmol/L (ref 0.0–2.0)
Bicarbonate: 22.6 mmol/L (ref 20.0–28.0)
Calcium, Ion: 1.22 mmol/L (ref 1.15–1.40)
HCT: 35 % — ABNORMAL LOW (ref 39.0–52.0)
Hemoglobin: 11.9 g/dL — ABNORMAL LOW (ref 13.0–17.0)
O2 Saturation: 92 %
Patient temperature: 104.5
Potassium: 3.6 mmol/L (ref 3.5–5.1)
Sodium: 134 mmol/L — ABNORMAL LOW (ref 135–145)
TCO2: 24 mmol/L (ref 22–32)
pCO2 arterial: 34.2 mmHg (ref 32–48)
pH, Arterial: 7.441 (ref 7.35–7.45)
pO2, Arterial: 72 mmHg — ABNORMAL LOW (ref 83–108)

## 2023-05-01 LAB — COMPREHENSIVE METABOLIC PANEL
ALT: 19 U/L (ref 0–44)
AST: 26 U/L (ref 15–41)
Albumin: 3.5 g/dL (ref 3.5–5.0)
Alkaline Phosphatase: 65 U/L (ref 38–126)
Anion gap: 11 (ref 5–15)
BUN: 14 mg/dL (ref 8–23)
CO2: 26 mmol/L (ref 22–32)
Calcium: 9 mg/dL (ref 8.9–10.3)
Chloride: 99 mmol/L (ref 98–111)
Creatinine, Ser: 1.24 mg/dL (ref 0.61–1.24)
GFR, Estimated: 57 mL/min — ABNORMAL LOW (ref >60)
Glucose, Bld: 175 mg/dL — ABNORMAL HIGH (ref 70–99)
Potassium: 3.9 mmol/L (ref 3.5–5.1)
Sodium: 136 mmol/L (ref 135–145)
Total Bilirubin: 1.1 mg/dL (ref 0.0–1.2)
Total Protein: 6.3 g/dL — ABNORMAL LOW (ref 6.5–8.1)

## 2023-05-01 LAB — RESP PANEL BY RT-PCR (RSV, FLU A&B, COVID)  RVPGX2
Influenza A by PCR: POSITIVE — AB
Influenza B by PCR: NEGATIVE
Resp Syncytial Virus by PCR: NEGATIVE
SARS Coronavirus 2 by RT PCR: NEGATIVE

## 2023-05-01 LAB — PROTIME-INR
INR: 1.1 (ref 0.8–1.2)
Prothrombin Time: 14.6 s (ref 11.4–15.2)

## 2023-05-01 LAB — URINALYSIS, W/ REFLEX TO CULTURE (INFECTION SUSPECTED)
Bilirubin Urine: NEGATIVE
Glucose, UA: NEGATIVE mg/dL
Ketones, ur: 40 mg/dL — AB
Leukocytes,Ua: NEGATIVE
Nitrite: NEGATIVE
Protein, ur: NEGATIVE mg/dL
Specific Gravity, Urine: 1.025 (ref 1.005–1.030)
pH: 6 (ref 5.0–8.0)

## 2023-05-01 LAB — I-STAT CG4 LACTIC ACID, ED
Lactic Acid, Venous: 1.8 mmol/L (ref 0.5–1.9)
Lactic Acid, Venous: 2.2 mmol/L (ref 0.5–1.9)

## 2023-05-01 LAB — LACTIC ACID, PLASMA
Lactic Acid, Venous: 2 mmol/L (ref 0.5–1.9)
Lactic Acid, Venous: 3.1 mmol/L (ref 0.5–1.9)
Lactic Acid, Venous: 3.8 mmol/L (ref 0.5–1.9)
Lactic Acid, Venous: 3.8 mmol/L (ref 0.5–1.9)

## 2023-05-01 LAB — HEMOGLOBIN A1C
Hgb A1c MFr Bld: 5.3 % (ref 4.8–5.6)
Mean Plasma Glucose: 105.41 mg/dL

## 2023-05-01 LAB — MAGNESIUM: Magnesium: 1.6 mg/dL — ABNORMAL LOW (ref 1.7–2.4)

## 2023-05-01 LAB — APTT: aPTT: 28 s (ref 24–36)

## 2023-05-01 LAB — TROPONIN I (HIGH SENSITIVITY)
Troponin I (High Sensitivity): 23 ng/L — ABNORMAL HIGH (ref <18)
Troponin I (High Sensitivity): 31 ng/L — ABNORMAL HIGH (ref <18)

## 2023-05-01 LAB — HEPARIN LEVEL (UNFRACTIONATED): Heparin Unfractionated: 0.36 [IU]/mL (ref 0.30–0.70)

## 2023-05-01 MED ORDER — PIPERACILLIN-TAZOBACTAM 3.375 G IVPB: 3.3750 g | Freq: Three times a day (TID) | INTRAVENOUS | Status: DC

## 2023-05-01 MED ORDER — ORAL CARE MOUTH RINSE
15.0000 mL | OROMUCOSAL | Status: DC
Start: 2023-05-01 — End: 2023-05-02
  Administered 2023-05-01 – 2023-05-02 (×18): 15 mL via OROMUCOSAL

## 2023-05-01 MED ORDER — POTASSIUM CHLORIDE 20 MEQ PO PACK
40.0000 meq | PACK | Freq: Once | ORAL | Status: AC
  Administered 2023-05-01: 40 meq
  Filled 2023-05-01: qty 2

## 2023-05-01 MED ORDER — OSELTAMIVIR PHOSPHATE 75 MG PO CAPS
75.0000 mg | ORAL_CAPSULE | Freq: Every day | ORAL | Status: DC
Start: 2023-05-01 — End: 2023-05-01

## 2023-05-01 MED ORDER — MEMANTINE HCL 10 MG PO TABS
10.0000 mg | ORAL_TABLET | Freq: Every day | ORAL | Status: DC
  Administered 2023-05-01 – 2023-05-02 (×2): 10 mg
  Filled 2023-05-01 (×3): qty 1

## 2023-05-01 MED ORDER — DOCUSATE SODIUM 100 MG PO CAPS: 100.0000 mg | ORAL_CAPSULE | Freq: Two times a day (BID) | ORAL | Status: DC | PRN

## 2023-05-01 MED ORDER — SODIUM CHLORIDE 0.9 % IV SOLN
250.0000 mL | INTRAVENOUS | Status: AC
  Administered 2023-05-01: 250 mL via INTRAVENOUS

## 2023-05-01 MED ORDER — FENTANYL CITRATE PF 50 MCG/ML IJ SOSY: 25.0000 ug | PREFILLED_SYRINGE | INTRAMUSCULAR | Status: DC | PRN

## 2023-05-01 MED ORDER — VANCOMYCIN HCL IN DEXTROSE 1-5 GM/200ML-% IV SOLN: 1000.0000 mg | Freq: Once | INTRAVENOUS | Status: DC

## 2023-05-01 MED ORDER — SUCCINYLCHOLINE CHLORIDE 20 MG/ML IJ SOLN
INTRAMUSCULAR | Status: DC | PRN
  Administered 2023-05-01: 80 mg via INTRAVENOUS

## 2023-05-01 MED ORDER — BUDESONIDE 0.5 MG/2ML IN SUSP
0.5000 mg | Freq: Two times a day (BID) | RESPIRATORY_TRACT | Status: DC
  Administered 2023-05-01 – 2023-05-09 (×14): 0.5 mg via RESPIRATORY_TRACT
  Filled 2023-05-01 (×17): qty 2

## 2023-05-01 MED ORDER — LACTATED RINGERS IV BOLUS
1000.0000 mL | Freq: Once | INTRAVENOUS | Status: AC
  Administered 2023-05-01: 1000 mL via INTRAVENOUS

## 2023-05-01 MED ORDER — LACTATED RINGERS IV BOLUS (SEPSIS)
1000.0000 mL | Freq: Once | INTRAVENOUS | Status: AC
  Administered 2023-05-01 (×2): 1000 mL via INTRAVENOUS

## 2023-05-01 MED ORDER — DONEPEZIL HCL 10 MG PO TABS
10.0000 mg | ORAL_TABLET | Freq: Every day | ORAL | Status: DC
  Filled 2023-05-01: qty 1

## 2023-05-01 MED ORDER — OSELTAMIVIR PHOSPHATE 30 MG PO CAPS
30.0000 mg | ORAL_CAPSULE | Freq: Two times a day (BID) | ORAL | Status: DC
  Administered 2023-05-01 – 2023-05-02 (×3): 30 mg
  Filled 2023-05-01 (×4): qty 1

## 2023-05-01 MED ORDER — POLYETHYLENE GLYCOL 3350 17 G PO PACK: 17.0000 g | PACK | Freq: Every day | ORAL | Status: DC | PRN

## 2023-05-01 MED ORDER — LACTATED RINGERS IV BOLUS (SEPSIS)
1000.0000 mL | Freq: Once | INTRAVENOUS | Status: AC
Start: 2023-05-01 — End: 2023-05-01
  Administered 2023-05-01: 1000 mL via INTRAVENOUS

## 2023-05-01 MED ORDER — DEXMEDETOMIDINE HCL IN NACL 400 MCG/100ML IV SOLN
0.0000 ug/kg/h | INTRAVENOUS | Status: DC
  Administered 2023-05-01: 0.4 ug/kg/h via INTRAVENOUS
  Administered 2023-05-01: 0.1 ug/kg/h via INTRAVENOUS
  Administered 2023-05-02: 0.4 ug/kg/h via INTRAVENOUS
  Filled 2023-05-01 (×3): qty 100

## 2023-05-01 MED ORDER — PROPOFOL 1000 MG/100ML IV EMUL
5.0000 ug/kg/min | INTRAVENOUS | Status: DC
Start: 2023-05-01 — End: 2023-05-01

## 2023-05-01 MED ORDER — NOREPINEPHRINE 4 MG/250ML-% IV SOLN
INTRAVENOUS | Status: AC
  Filled 2023-05-01: qty 250

## 2023-05-01 MED ORDER — SODIUM CHLORIDE 0.9 % IV SOLN: 3.0000 g | Freq: Four times a day (QID) | INTRAVENOUS | Status: DC

## 2023-05-01 MED ORDER — FENTANYL CITRATE PF 50 MCG/ML IJ SOSY
25.0000 ug | PREFILLED_SYRINGE | INTRAMUSCULAR | Status: DC | PRN
  Administered 2023-05-01: 50 ug via INTRAVENOUS
  Administered 2023-05-01: 100 ug via INTRAVENOUS
  Administered 2023-05-01 – 2023-05-02 (×4): 50 ug via INTRAVENOUS
  Filled 2023-05-01 (×2): qty 1
  Filled 2023-05-01 (×2): qty 2
  Filled 2023-05-01 (×2): qty 1

## 2023-05-01 MED ORDER — ARFORMOTEROL TARTRATE 15 MCG/2ML IN NEBU
15.0000 ug | INHALATION_SOLUTION | Freq: Two times a day (BID) | RESPIRATORY_TRACT | Status: DC
  Administered 2023-05-01 – 2023-05-09 (×14): 15 ug via RESPIRATORY_TRACT
  Filled 2023-05-01 (×16): qty 2

## 2023-05-01 MED ORDER — INSULIN ASPART 100 UNIT/ML IJ SOLN
0.0000 [IU] | INTRAMUSCULAR | Status: DC
  Administered 2023-05-01 (×2): 2 [IU] via SUBCUTANEOUS
  Administered 2023-05-01 – 2023-05-03 (×3): 1 [IU] via SUBCUTANEOUS

## 2023-05-01 MED ORDER — ACYCLOVIR 200 MG PO CAPS
400.0000 mg | ORAL_CAPSULE | Freq: Two times a day (BID) | ORAL | Status: DC
  Administered 2023-05-01 – 2023-05-02 (×4): 400 mg
  Filled 2023-05-01 (×5): qty 2

## 2023-05-01 MED ORDER — VANCOMYCIN HCL 1500 MG/300ML IV SOLN
1500.0000 mg | Freq: Once | INTRAVENOUS | Status: AC
  Administered 2023-05-01: 1500 mg via INTRAVENOUS
  Filled 2023-05-01: qty 300

## 2023-05-01 MED ORDER — ACETAMINOPHEN 650 MG RE SUPP
975.0000 mg | Freq: Once | RECTAL | Status: AC
  Administered 2023-05-01: 975 mg via RECTAL
  Filled 2023-05-01: qty 1

## 2023-05-01 MED ORDER — HEPARIN (PORCINE) 25000 UT/250ML-% IV SOLN
1100.0000 [IU]/h | INTRAVENOUS | Status: DC
  Administered 2023-05-01 – 2023-05-03 (×3): 1100 [IU]/h via INTRAVENOUS
  Filled 2023-05-01 (×3): qty 250

## 2023-05-01 MED ORDER — ACETAMINOPHEN 325 MG PO TABS
650.0000 mg | ORAL_TABLET | ORAL | Status: DC | PRN
  Administered 2023-05-02 – 2023-05-03 (×3): 650 mg
  Filled 2023-05-01 (×3): qty 2

## 2023-05-01 MED ORDER — ORAL CARE MOUTH RINSE: 15.0000 mL | OROMUCOSAL | Status: DC

## 2023-05-01 MED ORDER — DOCUSATE SODIUM 50 MG/5ML PO LIQD
100.0000 mg | Freq: Two times a day (BID) | ORAL | Status: DC
  Administered 2023-05-01 – 2023-05-02 (×3): 100 mg
  Filled 2023-05-01 (×3): qty 10

## 2023-05-01 MED ORDER — OSELTAMIVIR PHOSPHATE 30 MG PO CAPS
30.0000 mg | ORAL_CAPSULE | Freq: Two times a day (BID) | ORAL | Status: DC
  Filled 2023-05-01: qty 1

## 2023-05-01 MED ORDER — LACTATED RINGERS IV BOLUS
500.0000 mL | Freq: Once | INTRAVENOUS | Status: AC
  Administered 2023-05-01: 500 mL via INTRAVENOUS

## 2023-05-01 MED ORDER — ORAL CARE MOUTH RINSE: 15.0000 mL | OROMUCOSAL | Status: DC | PRN

## 2023-05-01 MED ORDER — AMIODARONE LOAD VIA INFUSION
150.0000 mg | Freq: Once | INTRAVENOUS | Status: AC
  Administered 2023-05-01: 150 mg via INTRAVENOUS
  Filled 2023-05-01: qty 83.34

## 2023-05-01 MED ORDER — AMIODARONE HCL IN DEXTROSE 360-4.14 MG/200ML-% IV SOLN
30.0000 mg/h | INTRAVENOUS | Status: DC
  Administered 2023-05-01 – 2023-05-03 (×5): 30 mg/h via INTRAVENOUS
  Filled 2023-05-01 (×4): qty 200

## 2023-05-01 MED ORDER — PANTOPRAZOLE SODIUM 40 MG IV SOLR
40.0000 mg | Freq: Every day | INTRAVENOUS | Status: DC
  Administered 2023-05-01 – 2023-05-02 (×2): 40 mg via INTRAVENOUS
  Filled 2023-05-01 (×2): qty 10

## 2023-05-01 MED ORDER — OSELTAMIVIR PHOSPHATE 75 MG PO CAPS: 75.0000 mg | ORAL_CAPSULE | Freq: Once | ORAL | Status: DC

## 2023-05-01 MED ORDER — MONTELUKAST SODIUM 10 MG PO TABS
10.0000 mg | ORAL_TABLET | Freq: Every day | ORAL | Status: DC
  Administered 2023-05-01 – 2023-05-02 (×2): 10 mg
  Filled 2023-05-01 (×2): qty 1

## 2023-05-01 MED ORDER — NOREPINEPHRINE 4 MG/250ML-% IV SOLN
2.0000 ug/min | INTRAVENOUS | Status: DC
  Administered 2023-05-01: 2 ug/min via INTRAVENOUS
  Administered 2023-05-01: 5 ug/min via INTRAVENOUS
  Filled 2023-05-01: qty 250

## 2023-05-01 MED ORDER — AZITHROMYCIN 500 MG PO TABS
500.0000 mg | ORAL_TABLET | Freq: Every day | ORAL | Status: DC
  Administered 2023-05-01 – 2023-05-02 (×2): 500 mg
  Filled 2023-05-01 (×3): qty 1

## 2023-05-01 MED ORDER — CHLORHEXIDINE GLUCONATE CLOTH 2 % EX PADS
6.0000 | MEDICATED_PAD | Freq: Every day | CUTANEOUS | Status: DC
  Administered 2023-05-01 – 2023-05-08 (×10): 6 via TOPICAL

## 2023-05-01 MED ORDER — POLYETHYLENE GLYCOL 3350 17 G PO PACK
17.0000 g | PACK | Freq: Every day | ORAL | Status: DC
  Administered 2023-05-01 – 2023-05-02 (×2): 17 g
  Filled 2023-05-01 (×2): qty 1

## 2023-05-01 MED ORDER — SODIUM CHLORIDE 0.9 % IV SOLN
2.0000 g | Freq: Once | INTRAVENOUS | Status: AC
  Administered 2023-05-01: 2 g via INTRAVENOUS
  Filled 2023-05-01: qty 12.5

## 2023-05-01 MED ORDER — AMIODARONE HCL IN DEXTROSE 360-4.14 MG/200ML-% IV SOLN
60.0000 mg/h | INTRAVENOUS | Status: DC
  Administered 2023-05-01: 60 mg/h via INTRAVENOUS
  Filled 2023-05-01: qty 200

## 2023-05-01 MED ORDER — PROPOFOL 1000 MG/100ML IV EMUL
5.0000 ug/kg/min | INTRAVENOUS | Status: DC
  Administered 2023-05-01: 5 ug/kg/min via INTRAVENOUS
  Filled 2023-05-01: qty 100

## 2023-05-01 MED ORDER — METRONIDAZOLE 500 MG/100ML IV SOLN
500.0000 mg | Freq: Once | INTRAVENOUS | Status: AC
  Administered 2023-05-01: 500 mg via INTRAVENOUS
  Filled 2023-05-01: qty 100

## 2023-05-01 MED ORDER — AMIODARONE HCL IN DEXTROSE 360-4.14 MG/200ML-% IV SOLN
INTRAVENOUS | Status: AC
  Administered 2023-05-01: 60 mg/h via INTRAVENOUS
  Filled 2023-05-01: qty 200

## 2023-05-01 MED ORDER — PIPERACILLIN-TAZOBACTAM 3.375 G IVPB
3.3750 g | Freq: Three times a day (TID) | INTRAVENOUS | Status: DC
  Administered 2023-05-01 – 2023-05-03 (×6): 3.375 g via INTRAVENOUS
  Filled 2023-05-01 (×6): qty 50

## 2023-05-01 MED ORDER — ONDANSETRON HCL 4 MG/2ML IJ SOLN: 4.0000 mg | Freq: Four times a day (QID) | INTRAMUSCULAR | Status: DC | PRN

## 2023-05-01 MED ORDER — PRAVASTATIN SODIUM 10 MG PO TABS
20.0000 mg | ORAL_TABLET | Freq: Every day | ORAL | Status: DC
  Administered 2023-05-01 – 2023-05-02 (×2): 20 mg
  Filled 2023-05-01 (×2): qty 2

## 2023-05-01 MED ORDER — LACTATED RINGERS IV SOLN: INTRAVENOUS | Status: DC

## 2023-05-01 MED ORDER — ETOMIDATE 2 MG/ML IV SOLN
INTRAVENOUS | Status: DC | PRN
  Administered 2023-05-01: 20 mg via INTRAVENOUS

## 2023-05-01 MED ORDER — VANCOMYCIN HCL 1250 MG/250ML IV SOLN
1250.0000 mg | INTRAVENOUS | Status: DC
  Administered 2023-05-02: 1250 mg via INTRAVENOUS
  Filled 2023-05-01 (×2): qty 250

## 2023-05-01 MED ORDER — OSELTAMIVIR PHOSPHATE 75 MG PO CAPS
75.0000 mg | ORAL_CAPSULE | Freq: Once | ORAL | Status: AC
  Administered 2023-05-01: 75 mg
  Filled 2023-05-01: qty 1

## 2023-05-01 MED ORDER — MAGNESIUM SULFATE 2 GM/50ML IV SOLN
2.0000 g | Freq: Once | INTRAVENOUS | Status: AC
  Administered 2023-05-01: 2 g via INTRAVENOUS
  Filled 2023-05-01: qty 50

## 2023-05-01 NOTE — TOC Initial Note (Addendum)
 Transition of Care (TOC) - Initial/Assessment Note    Patient Details  Name: [name]. MRN: [id number] Date of Birth: Feb 18, 1941  Transition of Care [facility]) CM/SW Contact:    [name] Coots, LCSW Phone Number: 05/01/2023, 12:07 PM  Clinical Narrative:                  12:07 PM Per progressions, patient is intubated and has HCPOA (son) without documentation on patient's chart. CSW called patient's daughter, [name], who confirmed she and brother (patient's son) are HCPOA. [name] stated that her and brother are currently in [place]. CSW offered form to be emailed. [name] consented to email form and provided email ([email or web address]). CSW emailed [name] to establish contact.  4:52 PM CSW received HCPOA from patient's son, [name] via email ([email or web address]). CSW placed HCPOA on patient's hard chart and relayed this to medical team.     Barriers to Discharge: Continued Medical Work up   Patient Goals and CMS Choice            Expected Discharge Plan and Services       Living arrangements for the past 2 months: Single Family Home                                      Prior Living Arrangements/Services Living arrangements for the past 2 months: Single Family Home Lives with:: Significant Other Patient language and need for interpreter reviewed:: Yes        Need for Family Participation in Patient Care: Yes (Comment) Care giver support system in place?: Yes (comment)   Criminal Activity/Legal Involvement Pertinent to Current Situation/Hospitalization: No - Comment as needed  Activities of Daily Living   ADL Screening (condition at time of admission) Independently performs ADLs?: Yes (appropriate for developmental age) Is the patient deaf or have difficulty hearing?: No Does the patient have difficulty seeing, even when wearing glasses/contacts?: No Does the patient have difficulty concentrating, remembering, or making decisions?:  No  Permission Sought/Granted Permission sought to share information with : Family Supports Permission granted to share information with : No (Contact information on chart)              Emotional Assessment Appearance:: Appears stated age Attitude/Demeanor/Rapport: Unable to Assess Affect (typically observed): Unable to Assess   Alcohol / Substance Use: Not Applicable Psych Involvement: No (comment)  Admission diagnosis:  Acute respiratory failure with hypoxemia (HCC) [J96.01] Patient Active Problem List   Diagnosis Date Noted   Acute respiratory failure with hypoxemia (HCC) 05/01/2023   Gait disorder 02/01/2023   Pressure injury of skin 02/18/2022   Dementia without behavioral disturbance (HCC) 02/18/2022   Neutropenic fever (HCC) 02/17/2022   Asthma 03/09/2020   Multiple myeloma (HCC) 12/26/2019   Coagulation disorder (HCC) 05/29/2019   Foot callus 03/20/2019   Tailor's bunion of right foot 01/01/2019   Capsulitis 01/01/2019   Sensorineural hearing loss (SNHL), 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   Pain due to onychomycosis of toenails of both feet 08/15/2018   Dermatitis associated with moisture from stool incontinence 08/14/2018   Gastric polyp    Incontinence of feces 08/19/2017   AAA (abdominal aortic aneurysm) (HCC) 06/30/2017   Anxiety 06/30/2017   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   Anosmia 02/25/2016   Chronic pansinusitis 02/25/2016   Impacted cerumen of both ears 02/25/2016   Noise effect on both inner ears 02/25/2016   Presbycusis of both ears 02/25/2016  Duodenal nodule    Renal cell carcinoma of left kidney (HCC) 11/11/2013   BPH (benign prostatic hyperplasia) 11/07/2011   Hip pain 07/04/2011   Perianal itch 07/04/2011   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 [facility] [address] [place] [address] Phone: [phone] Fax: [phone]  [id number] [place], [place] - [address] AT [facility] [address] [place] [address] Phone: [phone] Fax: [phone]     Social Drivers of Health (SDOH) Social History: SDOH Screenings   Food Insecurity: No Food Insecurity (05/01/2023)  Housing: Low Risk  (05/01/2023)  Transportation Needs: No Transportation Needs (05/01/2023)  Utilities: Not At Risk (05/01/2023)  Social Connections: Patient Unable To Answer (05/01/2023)  Tobacco Use: Medium Risk (05/01/2023)   SDOH Interventions:     Readmission Risk Interventions     No data to display

## 2023-05-01 NOTE — ED Triage Notes (Signed)
 PT BIB EMS from home, rhonchi lungs sounds, RA oxygen level was 80's. Hx multi myeloma. PT placed on NRB @15Ls  with EMS. EMS reports pt was in A-fib RVR 110-180's while in the truck. EMS reports normal baseline is GCS 15.

## 2023-05-01 NOTE — Progress Notes (Signed)
 Pharmacy Antibiotic Note  [name]. is a 83 y.o. male admitted on 05/01/2023 with pneumonia, bacterial superimposed on influenza A. Pt immunocompromised at baseline so will proceed with broader coverage than initial amp-sulbactam. Pharmacy has been consulted for vanc/zosyn dosing.  LA uptrending 1.8 > 2.2 > 3.1, fever on presentation of 104.5.  Plan: Azithromycin 500 mg daily x3 days Zosyn 3.375g IV q8h (4 hour infusion). Vancomycin 1500 mg LD given in ED at 0419  Vancomycin 1250 mg q24h IV maintenance dose eAUC 508.8, eVt 13.2 Vanc levels at Css (possibly 3/7 prior to AM dose) F/u cultures, renal function, clinical improvement  Height: 5' 9.02" (175.3 cm) Weight: 82.8 kg (182 lb 8.7 oz) IBW/kg (Calculated) : 70.74  Temp (24hrs), Avg:100.3 F (37.9 C), Min:98.8 F (37.1 C), Max:104.5 F (40.3 C)  Recent Labs  Lab 05/01/23 0248 05/01/23 0259 05/01/23 0445 05/01/23 0507 05/01/23 0949  WBC 6.1  --   --   --   --   CREATININE 1.24  --   --   --   --   LATICACIDVEN  --  1.8 2.0* 2.2* 3.1*    Estimated Creatinine Clearance: 44.3 mL/min (by C-G formula based on SCr of 1.24 mg/dL).    Allergies  Allergen Reactions   Proair Hfa [Albuterol] Palpitations    Only use in emergency due to has heart problems    Antimicrobials this admission: Vanc 3/3 >> Zosyn 3/3 >> Azithro 3/3 >> (3/5) Cefepime 3/3 Flagyl 3/3 Tamiflu 3/3  PTA acyclovir suppression  Microbiology results: 3/3 RVP: +FluA 3/3 Bcx: ng 12h 3/3 TA:  3/3 MRSA:   Thank you for allowing pharmacy to be a part of this patient's care.  Rutherford Nail, PharmD PGY2 Critical Care Pharmacy Resident 05/01/2023 10:26 AM

## 2023-05-01 NOTE — Progress Notes (Signed)
Sputum sample obtained and sent down to main lab without complications.  

## 2023-05-01 NOTE — Progress Notes (Signed)
 LA persistent 3.8. Not on epi or propofol. NE requirements stable and minimal today.  Additional fluid bolus tonight, repeat LA in AM.  Steffanie Dunn, DO 05/01/23 8:32 PM [facility]  For contact information, see Amion. If no response to pager, please call PCCM consult pager. After hours, 7PM- 7AM, please call Elink.

## 2023-05-01 NOTE — Code Documentation (Signed)
 Dr Manus Gunning performing intubation. RT at bedside.

## 2023-05-01 NOTE — Progress Notes (Signed)
 Initial Nutrition Assessment  DOCUMENTATION CODES:   Not applicable  INTERVENTION:   If patient does not extubate, recommend:  Vital 1.2@70ml /hr- Initiate at 57ml/hr and increase by 72ml/hr q 8 hours until goal rate is reached.   Free water flushes 30ml q4 hours to maintain tube patency   Regimen provides 2016kcal/day, 126g/day protein and 1573ml/day of free water.   Pt at high refeed risk; recommend monitor potassium, magnesium and phosphorus labs daily until stable  Daily weights   NUTRITION DIAGNOSIS:   Inadequate oral intake related to inability to eat (pt sedated and ventilated) as evidenced by NPO status.  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  Pt sedated and ventilated. NGT in place but needs advancement; RN aware. Plan is for possible extubation tomorrow. Will plan to initiate tube feeds if patient does not extubate. Pt is likely at refeed risk. Per chart, pt appears weight stable at baseline.   Medications reviewed and include: azithromycin, colace, insulin, protonix, miralax, heparin, levophed, zosyn, vancomycin   Labs reviewed: Na 134(L), K 3.6 wnl, Mg 1.6(L) Cbgs- 139, 158, 190 x 24 hrs  AIC 5.3  Patient is currently intubated on ventilator support MV: 10.5 L/min Temp (24hrs), Avg:100 F (37.8 C), Min:98.8 F (37.1 C), Max:104.5 F (40.3 C)  MAP >46mmHg   UOP-   NUTRITION - FOCUSED PHYSICAL EXAM:  Flowsheet Row Most Recent Value  Orbital  Region No depletion  Upper Arm Region Moderate depletion  Thoracic and Lumbar Region No depletion  Buccal Region No depletion  Temple Region Mild depletion  Clavicle Bone Region Mild depletion  Clavicle and Acromion Bone Region Mild depletion  Scapular Bone Region No depletion  Dorsal Hand Unable to assess  Patellar Region Moderate depletion  Anterior Thigh Region Moderate depletion  Posterior Calf Region Mild depletion  Edema (RD Assessment) None  Hair Reviewed  Eyes Reviewed  Mouth Reviewed  Skin Reviewed  [ecchymosis]  Nails Reviewed   Diet Order:   Diet Order             Diet NPO time specified  Diet effective now                  EDUCATION NEEDS:   No education needs have been identified at this time  Skin:  Skin Assessment: Reviewed RN Assessment (ecchymosis)  Last BM:  3/2  Height:   Ht Readings from Last 1 Encounters:  05/01/23 5' 9.02" (1.753 m)    Weight:   Wt Readings from Last 1 Encounters:  05/01/23 82.8 kg    Ideal Body Weight:  72.7 kg  BMI:  Body mass index is 26.94 kg/m.  Estimated Nutritional Needs:   Kcal:  2137kcal/day  Protein:  110-125g/day  Fluid:  1.9-2.2L/day  Betsey Holiday MS, RD, LDN If unable to be reached, please send secure chat to "RD inpatient" available from 8:00a-4:00p daily

## 2023-05-01 NOTE — ED Notes (Signed)
 Patient is actively moving. Sedation and norepinephrine increased. RT at bedside.

## 2023-05-01 NOTE — ED Notes (Signed)
Patient is ready for transport.  

## 2023-05-01 NOTE — ED Notes (Signed)
 Team preparing to intubate pt

## 2023-05-01 NOTE — Plan of Care (Signed)
   Problem: Education: Goal: Ability to describe self-care measures that may prevent or decrease complications (Diabetes Survival Skills Education) will improve Outcome: Progressing Goal: Individualized Educational Video(s) Outcome: Progressing   Problem: Coping: Goal: Ability to adjust to condition or change in health will improve Outcome: Progressing   Problem: Fluid Volume: Goal: Ability to maintain a balanced intake and output will improve Outcome: Progressing   Problem: Health Behavior/Discharge Planning: Goal: Ability to identify and utilize available resources and services will improve Outcome: Progressing Goal: Ability to manage health-related needs will improve Outcome: Progressing   Problem: Metabolic: Goal: Ability to maintain appropriate glucose levels will improve Outcome: Progressing   Problem: Nutritional: Goal: Maintenance of adequate nutrition will improve Outcome: Progressing Goal: Progress toward achieving an optimal weight will improve Outcome: Progressing   Problem: Skin Integrity: Goal: Risk for impaired skin integrity will decrease Outcome: Progressing   Problem: Tissue Perfusion: Goal: Adequacy of tissue perfusion will improve Outcome: Progressing   Problem: Activity: Goal: Ability to tolerate increased activity will improve Outcome: Progressing   Problem: Respiratory: Goal: Ability to maintain a clear airway and adequate ventilation will improve Outcome: Progressing   Problem: Role Relationship: Goal: Method of communication will improve Outcome: Progressing   Problem: Education: Goal: Knowledge of General Education information will improve Description: Including pain rating scale, medication(s)/side effects and non-pharmacologic comfort measures Outcome: Progressing   Problem: Health Behavior/Discharge Planning: Goal: Ability to manage health-related needs will improve Outcome: Progressing   Problem: Clinical Measurements: Goal:  Ability to maintain clinical measurements within normal limits will improve Outcome: Progressing Goal: Will remain free from infection Outcome: Progressing Goal: Diagnostic test results will improve Outcome: Progressing Goal: Respiratory complications will improve Outcome: Progressing Goal: Cardiovascular complication will be avoided Outcome: Progressing   Problem: Activity: Goal: Risk for activity intolerance will decrease Outcome: Progressing   Problem: Nutrition: Goal: Adequate nutrition will be maintained Outcome: Progressing   Problem: Coping: Goal: Level of anxiety will decrease Outcome: Progressing   Problem: Elimination: Goal: Will not experience complications related to bowel motility Outcome: Progressing Goal: Will not experience complications related to urinary retention Outcome: Progressing   Problem: Pain Managment: Goal: General experience of comfort will improve and/or be controlled Outcome: Progressing   Problem: Safety: Goal: Ability to remain free from injury will improve Outcome: Progressing   Problem: Skin Integrity: Goal: Risk for impaired skin integrity will decrease Outcome: Progressing

## 2023-05-01 NOTE — Plan of Care (Signed)
  Problem: Tissue Perfusion: Goal: Adequacy of tissue perfusion will improve Outcome: Progressing   Problem: Respiratory: Goal: Ability to maintain a clear airway and adequate ventilation will improve Outcome: Progressing

## 2023-05-01 NOTE — Progress Notes (Signed)
 PHARMACY - ANTICOAGULATION CONSULT NOTE  Pharmacy Consult for heparin, Unasyn Indication: atrial fibrillation, aspiration PNA  Allergies  Allergen Reactions   Proair Hfa [Albuterol] Palpitations    Only use in emergency due to has heart problems    Patient Measurements: Height: 5\' 9"  (175.3 cm) Weight: 82.8 kg (182 lb 8.7 oz) IBW/kg (Calculated) : 70.7 Heparin Dosing Weight: TBW  Vital Signs: Temp: 101.1 F (38.4 C) (03/03 0510) Temp Source: Rectal (03/03 0247) BP: 105/59 (03/03 0510) Pulse Rate: 82 (03/03 0510)  Labs: Recent Labs    05/01/23 0248 05/01/23 0314  HGB 13.3 11.9*  HCT 38.5* 35.0*  PLT 100*  --   APTT 28  --   LABPROT 14.6  --   INR 1.1  --   CREATININE 1.24  --   TROPONINIHS 23*  --     Estimated Creatinine Clearance: 44.3 mL/min (by C-G formula based on SCr of 1.24 mg/dL).  Medications:  -Pradaxa 150mg  Potwo times daily (LD unknown)  Assessment: 64 yoM presented to the ED with AMS, fever, and hypoxia. Pharmacy consulted to dose heparin for atrial fibrillation and Unasyn for aspiration pneumonia.  -CBC shows Hgb ~12, plts 100, aPTT at baseline 28 -sCr 1.24 (~bl) with CrCl 44 ml/min -Received cefepime/vanc/flagyl in ED, fluA positive -Tmax 104.5 >> 101.1  Goal of Therapy:  Heparin level 0.3-0.7 units/ml Monitor platelets by anticoagulation protocol: Yes   Plan:  -Assuming patient took Pradaxa evening dose, will start heparin ~12 post when dose given. -Start heparin 1100 units/hr -8h heparin level -CBC and heparin level daily  -Unasyn 3g IV every 6 hours -Monitor renal function -Follow up signs of clinical improvement, LOT, de-escalation of antibiotics    Arabella Merles, PharmD. Clinical Pharmacist 05/01/2023 5:42 AM

## 2023-05-01 NOTE — Sepsis Progress Note (Signed)
 Elink following code sepsis

## 2023-05-01 NOTE — Progress Notes (Signed)
 PHARMACY - ANTICOAGULATION CONSULT NOTE  Pharmacy Consult for heparin Indication: atrial fibrillation  Allergies  Allergen Reactions   Proair Hfa [Albuterol] Palpitations    Only use in emergency due to has heart problems    Patient Measurements: Height: 5' 9.02" (175.3 cm) Weight: 82.8 kg (182 lb 8.7 oz) IBW/kg (Calculated) : 70.74 Heparin Dosing Weight: TBW  Vital Signs: Temp: 100.6 F (38.1 C) (03/03 2115) Temp Source: Bladder (03/03 2000) BP: 114/60 (03/03 2115) Pulse Rate: 69 (03/03 2115)  Labs: Recent Labs    05/01/23 0248 05/01/23 0314 05/01/23 0445 05/01/23 2102  HGB 13.3 11.9*  --   --   HCT 38.5* 35.0*  --   --   PLT 100*  --   --   --   APTT 28  --   --   --   LABPROT 14.6  --   --   --   INR 1.1  --   --   --   HEPARINUNFRC  --   --   --  0.36  CREATININE 1.24  --   --   --   TROPONINIHS 23*  --  31*  --     Estimated Creatinine Clearance: 44.3 mL/min (by C-G formula based on SCr of 1.24 mg/dL).  Medications:  -Pradaxa 150mg  Potwo times daily (LD unknown)  Assessment: 53 yoM presented to the ED with AMS, fever, and hypoxia. Pharmacy consulted to dose heparin for atrial fibrillation.   -CBC shows Hgb ~12, plts 100.  Heparin level returned therapeutic at 0.36 on 1100 units/hr.   Goal of Therapy:  Heparin level 0.3-0.7 units/ml Monitor platelets by anticoagulation protocol: Yes   Plan:  -Continue heparin 1100 units/hr - Confirmatory heparin level in AM  -CBC and heparin level daily  Cedric Fishman, PharmD, BCPS, BCCCP Clinical Pharmacist

## 2023-05-01 NOTE — H&P (Signed)
 NAME:  [name] Reppond., MRN:  [id number], DOB:  [birth year], LOS: 0 ADMISSION DATE:  05/01/2023, CONSULTATION DATE:  05/01/23 REFERRING MD:  Rancour, CHIEF COMPLAINT:  AMS   History of Present Illness:  83 yo M PMH Multiple myeloma (on daratumumuab and dexamethasone) , Afib (pradaxa, metop & dilt), HTN presented to [facility] ED 3/3 w AMS, associated fever & hypoxia. Was found w gurgling respirations. Intubated in ED as he was obtunded -- this was /w family before hand.  Febrile, c/f sepsis started on empiric abx  PCCM to admit in this setting     Pertinent  Medical History  Multiple myeloma Afib HLD HTN OSA AAA BPH RCC L kidney  Significant Hospital Events: Including procedures, antibiotic start and stop dates in addition to other pertinent events   3/3 ED w AMS fever hypoxia. Intubated. Admit to ICU w Flu A   Interim History / Subjective:  Intubated in ED   Objective   Blood pressure 115/74, pulse (!) 115, temperature (!) 104.5 F (40.3 C), temperature source Rectal, resp. rate (!) 24, height 5\' 9"  (1.753 m), weight 82.8 kg, SpO2 100%.    Vent Mode: PRVC FiO2 (%):  [100 %] 100 % Set Rate:  [18 bmp] 18 bmp Vt Set:  [600 mL] 600 mL PEEP:  [5 cmH20] 5 cmH20 Plateau Pressure:  [26 cmH20] 26 cmH20  No intake or output data in the 24 hours ending 05/01/23 0453 Filed Weights   05/01/23 0247  Weight: 82.8 kg    Examination: General: critically ill appearing elderly M intubated  HENT: NCAT ETT secure Lungs: Mechanically ventilated. Coarse, some rhonchi  Cardiovascular: irir cap refill brisk  Abdomen: soft round Extremities: no obvious acute joint deformity  Neuro: Sedated, PERRL54mm  GU: defer Skin: flushed, hot to touch   Resolved Hospital Problem list     Assessment & Plan:   Acute encephalopathy Septic shock 2/2 Influenza A infection  Acute resp failure w hypoxia Aspiration PNA  Afib RVR Multiple myeloma (looks like a q28d cycle ofdara + dexamethasone, follows at  [facility]) Hx HTN  BPH  mild dementia, with gait instability  OSA on cpap Mild asthma  P -admit to ICU -VAP, pulm hygiene -prop / PRN fent for RASS -1/-2 -tamiflu -getting 1x vanc + flagyl + cefepime in ED for sepsis (ordered prior to Flu A resulting). Moving forward will do unasyn w c/f aspiration event prior to intubation  -trach asp is ordered given degree of concomitant bacterial CAP we have seen this year. Abx can be broadened back if needed -Amio, hep gtt -periph NE for MAP > 65  -when off pressors will see if we can start metop and dilt (home meds for fib)  -insert foley  -PRN APAP, ice packs for fever  -IVF  -home namenda and aricept  -brovana budesonide singulair  -BID acyclovir for HSV/VZV suppression  Best Practice (right click and "Reselect all SmartList Selections" daily)   Diet/type: NPO DVT prophylaxis systemic heparin Pressure ulcer(s): pressure ulcer assessment deferred  GI prophylaxis: PPI Lines: N/A Foley:  Yes, and it is still needed Code Status:  full code Last date of multidisciplinary goals of care discussion [--]  Looks like EM physician was able to connect w family re code status and it is presumed he would want to be full code-- appreciate the time taken to speak with them. I have not been able to reach them subsequently & were no longer at bedside.   Labs  CBC: Recent Labs  Lab 05/01/23 0248 05/01/23 0314  WBC 6.1  --   NEUTROABS 5.7  --   HGB 13.3 11.9*  HCT 38.5* 35.0*  MCV 93.9  --   PLT 100*  --     Basic Metabolic Panel: Recent Labs  Lab 05/01/23 0248 05/01/23 0314  NA 136 134*  K 3.9 3.6  CL 99  --   CO2 26  --   GLUCOSE 175*  --   BUN 14  --   CREATININE 1.24  --   CALCIUM 9.0  --    GFR: Estimated Creatinine Clearance: 44.3 mL/min (by C-G formula based on SCr of 1.24 mg/dL). Recent Labs  Lab 05/01/23 0248 05/01/23 0259  WBC 6.1  --   LATICACIDVEN  --  1.8    Liver Function Tests: Recent Labs  Lab  05/01/23 0248  AST 26  ALT 19  ALKPHOS 65  BILITOT 1.1  PROT 6.3*  ALBUMIN 3.5   No results for input(s): "LIPASE", "AMYLASE" in the last 168 hours. No results for input(s): "AMMONIA" in the last 168 hours.  ABG    Component Value Date/Time   PHART 7.441 05/01/2023 0314   PCO2ART 34.2 05/01/2023 0314   PO2ART 72 (L) 05/01/2023 0314   HCO3 22.6 05/01/2023 0314   TCO2 24 05/01/2023 0314   O2SAT 92 05/01/2023 0314     Coagulation Profile: Recent Labs  Lab 05/01/23 0248  INR 1.1    Cardiac Enzymes: No results for input(s): "CKTOTAL", "CKMB", "CKMBINDEX", "TROPONINI" in the last 168 hours.  HbA1C: No results found for: "HGBA1C"  CBG: No results for input(s): "GLUCAP" in the last 168 hours.  Review of Systems:   Unable to obtain intubated   Past Medical History:  He,  has a past medical history of Allergy, Arrhythmia, Atrial fibrillation (HCC), Barrett esophagus, Barrett's esophagus, Blood transfusion without reported diagnosis, BPH (benign prostatic hyperplasia), Cervical stenosis of spine, Chronic obstructive asthma, Dementia (HCC), Difficult intubation (13 or 14 years ago, at [facility] long), ED (erectile dysfunction), GERD (gastroesophageal reflux disease), Hammer toe of right foot, Hiatal hernia, Hyperlipidemia, Hypertension, OSA (obstructive sleep apnea) (08/05/10 PS[facility]), Paroxysmal atrial fibrillation (HCC), Renal cell carcinoma (2009), and Sleep apnea.   Surgical History:   Past Surgical History:  Procedure Laterality Date   BASAL CELL CARCINOMA EXCISION     BIOPSY  12/18/2017   Procedure: BIOPSY;  Surgeon: Meryl Dare, MD;  Location: [facility];  Service: Endoscopy;;   ESOPHAGOGASTRODUODENOSCOPY (EGD) WITH PROPOFOL N/A 08/26/2014   Procedure: ESOPHAGOGASTRODUODENOSCOPY (EGD) WITH PROPOFOL;  Surgeon: Meryl Dare, MD;  Location: [facility];  Service: Endoscopy;  Laterality: N/A;   ESOPHAGOGASTRODUODENOSCOPY (EGD) WITH PROPOFOL N/A 12/18/2017   Procedure:  ESOPHAGOGASTRODUODENOSCOPY (EGD) WITH PROPOFOL;  Surgeon: Meryl Dare, MD;  Location: [facility];  Service: Endoscopy;  Laterality: N/A;   EXPLORATORY LAPAROTOMY W/ BOWEL RESECTION  12/1999   segmental sbr of a hemangioma, repair of incarcerated umbilical hernia   exploratory laparotomy, segmental small bowel resection of a hemangioma, repair of incarcerated umbelical hernia  12/1999   EYE SURGERY     cataracts done   renal cancer surgery with RF Ablation     TONSILLECTOMY       Social History:   reports that he quit smoking about 43 years ago. His smoking use included cigars. He has been exposed to tobacco smoke. He has never used smokeless tobacco. He reports that he does not drink alcohol and does not use drugs.  Family History:  His family history includes Asthma in his paternal grandmother; Diabetes in his brother, father, and mother; Heart attack in his paternal grandfather; Kidney cancer in his paternal aunt; Obesity in his brother; Stroke in his mother. There is no history of Colon cancer, Esophageal cancer, Rectal cancer, Stomach cancer, Dementia, or Alzheimer's disease.   Allergies Allergies  Allergen Reactions   Tamsulosin Other (See Comments)    dizziness   Proair Hfa [Albuterol] Palpitations    Only use in emergency due to has heart problems     Home Medications  Prior to Admission medications   Medication Sig Start Date End Date Taking? Authorizing Provider  acyclovir (ZOVIRAX) 400 MG tablet Take 400 mg by mouth 2 (two) times daily. 01/05/20   [provider]  alfuzosin (UROXATRAL) 10 MG 24 hr tablet Take 1 tablet by mouth every evening. 08/11/17   [provider]  AMBULATORY NON FORMULARY MEDICATION CPAP at night    [provider]  benzonatate (TESSALON) 100 MG capsule Take by mouth every 8 (eight) hours as needed. 03/13/23   [provider]  calcium citrate-vitamin D (CITRACAL+D) 315-200 MG-UNIT tablet Take 2 tablets by mouth  daily. 03/12/20   [provider]  CARTIA XT 120 MG 24 hr capsule Take 120 mg by mouth every morning. 12/31/22   [provider]  chlorthalidone (HYGROTON) 25 MG tablet Take 25 mg by mouth as needed (As needed per wife).    [provider]  DARATUMUMAB IV Inject 1 Dose into the skin every 30 (thirty) days.    [provider]  donepezil (ARICEPT) 10 MG tablet Take 10 mg by mouth daily.  06/27/19   [provider]  dutasteride (AVODART) 0.5 MG capsule Take 0.5 mg by mouth every evening. 02/26/15   [provider]  fluticasone-salmeterol (ADVAIR) 250-50 MCG/ACT AEPB Inhale 1 puff into the lungs 2 (two) times daily.    [provider]  Melatonin 1 MG CAPS Take by mouth.    [provider]  memantine (NAMENDA) 10 MG tablet Take 10 mg by mouth 2 (two) times daily. 12/06/19   [provider]  metoprolol succinate (TOPROL-XL) 25 MG 24 hr tablet Take 25 mg by mouth every morning.    [provider]  montelukast (SINGULAIR) 10 MG tablet TAKE 1 TABLET(10 MG) BY MOUTH AT BEDTIME Patient taking differently: Take 10 mg by mouth in the morning. 01/29/18   Coralyn Helling, MD  Multiple Vitamins-Minerals (MULTIVITAMIN WITH MINERALS) tablet Take 1 tablet by mouth daily.    [provider]  omeprazole (PRILOSEC) 20 MG capsule TAKE ONE CAPSULE BY MOUTH EVERY DAY 11/29/16   Meryl Dare, MD  potassium chloride (MICRO-K) 10 MEQ CR capsule Take 20 mEq by mouth daily. 01/27/22   [provider]  PRADAXA 150 MG CAPS capsule TAKE ONE CAPSULE BY MOUTH TWICE DAILY 01/02/23   [place], Peter M, MD  pravastatin (PRAVACHOL) 20 MG tablet Take 20 mg daily by mouth.    [provider]  Respiratory Therapy Supplies (ACE AEROSOL CLOUD ENHANCER) MISC Inhale into the lungs See admin instructions. 02/06/23   [provider]  tadalafil (CIALIS) 5 MG tablet Take 1 tablet by mouth daily. 09/16/20   [provider]   zoledronic acid (ZOMETA) 4 MG/5ML injection Inject 4 mg into the vein once. Yearly    [provider]     Critical care time: 53 min        CRITICAL CARE Performed  by: Lanier Clam   Total critical care time: 53 minutes  Critical care time was exclusive of separately billable procedures and treating other patients. Critical care was necessary to treat or prevent imminent or life-threatening deterioration.  Critical care was time spent personally by me on the following activities: development of treatment plan with patient and/or surrogate as well as nursing, discussions with consultants, evaluation of patient's response to treatment, examination of patient, obtaining history from patient or surrogate, ordering and performing treatments and interventions, ordering and review of laboratory studies, ordering and review of radiographic studies, pulse oximetry and re-evaluation of patient's condition.'  Tessie Fass MSN, AGACNP-BC [facility] Pulmonary/Critical Care Medicine Amion for pager 05/01/2023, 4:53 AM

## 2023-05-01 NOTE — ED Provider Notes (Signed)
 [place] EMERGENCY DEPARTMENT AT [facility] Provider Note   CSN: [id number] Arrival date & time: 05/01/23  0240     History  Chief Complaint  Patient presents with   Altered Mental Status   Fever    [name]. is a 83 y.o. male.  Level 5 For ED condition.  Patient unable to give any history.  EMS was called out for decreased mental status of unknown duration.  He was found to be somnolent, hypoxic, gurgling respirations A-fib with RVR as high as 180.  Patient is obtunded, has gurgling respirations and unable to give any history.  He denies pain.  He is tachycardic, febrile and hypoxic.  Concern for aspiration.  History of multiple myeloma, atrial fibrillation, GERD, hiatal hernia, hypertension and hyperlipidemia.  The history is provided by the patient. The history is limited by the condition of the patient.  Altered Mental Status Associated symptoms: fever   Fever      Home Medications Prior to Admission medications   Medication Sig Start Date End Date Taking? Authorizing Provider  acyclovir (ZOVIRAX) 400 MG tablet Take 400 mg by mouth 2 (two) times daily. 01/05/20   [provider]  alfuzosin (UROXATRAL) 10 MG 24 hr tablet Take 1 tablet by mouth every evening. 08/11/17   [provider]  AMBULATORY NON FORMULARY MEDICATION CPAP at night    [provider]  benzonatate (TESSALON) 100 MG capsule Take by mouth every 8 (eight) hours as needed. 03/13/23   [provider]  calcium citrate-vitamin D (CITRACAL+D) 315-200 MG-UNIT tablet Take 2 tablets by mouth daily. 03/12/20   [provider]  CARTIA XT 120 MG 24 hr capsule Take 120 mg by mouth every morning. 12/31/22   [provider]  chlorthalidone (HYGROTON) 25 MG tablet Take 25 mg by mouth as needed (As needed per wife).    [provider]  DARATUMUMAB IV Inject 1 Dose into the skin every 30 (thirty) days.    [provider]  donepezil  (ARICEPT) 10 MG tablet Take 10 mg by mouth daily.  06/27/19   [provider]  dutasteride (AVODART) 0.5 MG capsule Take 0.5 mg by mouth every evening. 02/26/15   [provider]  fluticasone-salmeterol (ADVAIR) 250-50 MCG/ACT AEPB Inhale 1 puff into the lungs 2 (two) times daily.    [provider]  Melatonin 1 MG CAPS Take by mouth.    [provider]  memantine (NAMENDA) 10 MG tablet Take 10 mg by mouth 2 (two) times daily. 12/06/19   [provider]  metoprolol succinate (TOPROL-XL) 25 MG 24 hr tablet Take 25 mg by mouth every morning.    [provider]  montelukast (SINGULAIR) 10 MG tablet TAKE 1 TABLET(10 MG) BY MOUTH AT BEDTIME Patient taking differently: Take 10 mg by mouth in the morning. 01/29/18   Coralyn Helling, MD  Multiple Vitamins-Minerals (MULTIVITAMIN WITH MINERALS) tablet Take 1 tablet by mouth daily.    [provider]  omeprazole (PRILOSEC) 20 MG capsule TAKE ONE CAPSULE BY MOUTH EVERY DAY 11/29/16   Meryl Dare, MD  potassium chloride (MICRO-K) 10 MEQ CR capsule Take 20 mEq by mouth daily. 01/27/22   [provider]  PRADAXA 150 MG CAPS capsule TAKE ONE CAPSULE BY MOUTH TWICE DAILY 01/02/23   [place], Peter M, MD  pravastatin (PRAVACHOL) 20 MG tablet Take 20 mg daily by mouth.    [provider]  Respiratory Therapy Supplies (ACE AEROSOL CLOUD ENHANCER)  MISC Inhale into the lungs See admin instructions. 02/06/23   [provider]  tadalafil (CIALIS) 5 MG tablet Take 1 tablet by mouth daily. 09/16/20   [provider]  zoledronic acid (ZOMETA) 4 MG/5ML injection Inject 4 mg into the vein once. Yearly    [provider]      Allergies    Tamsulosin and Proair hfa [albuterol]    Review of Systems   Review of Systems  Unable to perform ROS: Severe respiratory distress  Constitutional:  Positive for fever.    Physical Exam Updated Vital Signs BP (!) 132/106   Pulse (!)  147   Temp (!) 104.5 F (40.3 C) (Rectal)   Ht 5\' 9"  (1.753 m)   Wt 82.8 kg   SpO2 91%   BMI 26.96 kg/m  Physical Exam Constitutional:      General: He is in acute distress.     Appearance: He is ill-appearing and toxic-appearing.     Comments: Gurgling respirations, obtunded  HENT:     Head: Normocephalic and atraumatic.     Mouth/Throat:     Mouth: Mucous membranes are moist.  Eyes:     Extraocular Movements: Extraocular movements intact.     Pupils: Pupils are equal, round, and reactive to light.  Cardiovascular:     Rate and Rhythm: Tachycardia present. Rhythm irregular.  Pulmonary:     Effort: Respiratory distress present.     Breath sounds: Rhonchi present.  Abdominal:     Tenderness: There is no abdominal tenderness. There is no guarding.  Musculoskeletal:        General: No swelling or tenderness. Normal range of motion.  Neurological:     General: No focal deficit present.     Comments: Obtunded, oriented to name, does not follow commands     ED Results / Procedures / Treatments   Labs (all labs ordered are listed, but only abnormal results are displayed) Labs Reviewed  RESP PANEL BY RT-PCR (RSV, FLU A&B, COVID)  RVPGX2 - Abnormal; Notable for the following components:      Result Value   Influenza A by PCR POSITIVE (*)    All other components within normal limits  COMPREHENSIVE METABOLIC PANEL - Abnormal; Notable for the following components:   Glucose, Bld 175 (*)    Total Protein 6.3 (*)    GFR, Estimated 57 (*)    All other components within normal limits  CBC WITH DIFFERENTIAL/PLATELET - Abnormal; Notable for the following components:   RBC 4.10 (*)    HCT 38.5 (*)    RDW 15.9 (*)    Platelets 100 (*)    Lymphs Abs 0.1 (*)    All other components within normal limits  URINALYSIS, W/ REFLEX TO CULTURE (INFECTION SUSPECTED) - Abnormal; Notable for the following components:   Hgb urine dipstick MODERATE (*)    Ketones, ur 40 (*)    Non Squamous  Epithelial PRESENT (*)    Bacteria, UA RARE (*)    All other components within normal limits  LACTIC ACID, PLASMA - Abnormal; Notable for the following components:   Lactic Acid, Venous 2.0 (*)    All other components within normal limits  I-STAT ARTERIAL BLOOD GAS, ED - Abnormal; Notable for the following components:   pO2, Arterial 72 (*)    Sodium 134 (*)    HCT 35.0 (*)    Hemoglobin 11.9 (*)    All other components within normal limits  I-STAT CG4 LACTIC ACID, ED -  Abnormal; Notable for the following components:   Lactic Acid, Venous 2.2 (*)    All other components within normal limits  TROPONIN I (HIGH SENSITIVITY) - Abnormal; Notable for the following components:   Troponin I (High Sensitivity) 23 (*)    All other components within normal limits  TROPONIN I (HIGH SENSITIVITY) - Abnormal; Notable for the following components:   Troponin I (High Sensitivity) 31 (*)    All other components within normal limits  CULTURE, BLOOD (ROUTINE X 2)  CULTURE, BLOOD (ROUTINE X 2)  MRSA NEXT GEN BY PCR, NASAL  CULTURE, RESPIRATORY W GRAM STAIN  PROTIME-INR  APTT  HEMOGLOBIN A1C  HEPARIN LEVEL (UNFRACTIONATED)  I-STAT CG4 LACTIC ACID, ED    EKG EKG Interpretation Date/Time:  Monday May 01 2023 03:19:46 EST Ventricular Rate:  142 PR Interval:    QRS Duration:  79 QT Interval:  296 QTC Calculation: 455 R Axis:   189  Text Interpretation: Atrial fibrillation with rapid V-rate Right axis deviation Abnormal R-wave progression, late transition No significant change was found Confirmed by Glynn Octave [phone]) on 05/01/2023 3:27:36 AM  Radiology DG Chest Portable 1 View Result Date: 05/01/2023 CLINICAL DATA:  Intubation EXAM: PORTABLE CHEST 1 VIEW ABDOMEN ONE VIEW COMPARISON:  Earlier today FINDINGS: Endotracheal tube with tip at the clavicular heads. The enteric tube tip reaches the stomach with side port the level of the lower esophagus. Diffuse interstitial coarsening without  progression. No Kerley lines, effusion, or pneumothorax. Artifact from support hardware. In abdomen there is no abnormal mass effect or worrisome calcification. The covered bowel gas pattern is normal. IMPRESSION: 1. New enteric tube with tip at the stomach and side port near the GE junction. 2. New endotracheal tube in unremarkable position. 3. Stable aeration. Electronically Signed   By: Tiburcio Pea M.D.   On: 05/01/2023 04:43   DG Abd 1 View Result Date: 05/01/2023 CLINICAL DATA:  Intubation EXAM: PORTABLE CHEST 1 VIEW ABDOMEN ONE VIEW COMPARISON:  Earlier today FINDINGS: Endotracheal tube with tip at the clavicular heads. The enteric tube tip reaches the stomach with side port the level of the lower esophagus. Diffuse interstitial coarsening without progression. No Kerley lines, effusion, or pneumothorax. Artifact from support hardware. In abdomen there is no abnormal mass effect or worrisome calcification. The covered bowel gas pattern is normal. IMPRESSION: 1. New enteric tube with tip at the stomach and side port near the GE junction. 2. New endotracheal tube in unremarkable position. 3. Stable aeration. Electronically Signed   By: Tiburcio Pea M.D.   On: 05/01/2023 04:43   DG Abd Portable 2 Views Result Date: 05/01/2023 CLINICAL DATA:  Rhonchi. Abdominal pain. Low oxygen saturations. History of multiple myeloma. EXAM: PORTABLE ABDOMEN - 2 VIEW COMPARISON:  None Available. FINDINGS: Gaseous distention of small bowel in the central abdomen measuring up to 3.3 cm. There is no evidence of free air. No radio-opaque calculi or other significant radiographic abnormality is seen. IMPRESSION: Gaseous distention of the small bowel in the central abdomen. This may be due to ileus or obstruction. Consider CT abdomen and pelvis with IV contrast for further evaluation. Electronically Signed   By: Minerva Fester M.D.   On: 05/01/2023 03:44   DG Chest Port 1 View Result Date: 05/01/2023 CLINICAL DATA:  Hypoxia  and worsening breath sounds EXAM: PORTABLE CHEST 1 VIEW COMPARISON:  03/24/2023 FINDINGS: Cardiac shadow is prominent but stable. Aortic calcifications are seen. Mild vascular congestion is. Some interstitial changes are noted which may represent  very early edema. No focal infiltrate is seen. IMPRESSION: Early CHF. Electronically Signed   By: Alcide Clever M.D.   On: 05/01/2023 03:18    Procedures .Critical Care  Performed by: Glynn Octave, MD Authorized by: Glynn Octave, MD   Critical care provider statement:    Critical care time (minutes):  75   Critical care time was exclusive of:  Separately billable procedures and treating other patients   Critical care was necessary to treat or prevent imminent or life-threatening deterioration of the following conditions:  Respiratory failure, sepsis and shock   Critical care was time spent personally by me on the following activities:  Development of treatment plan with patient or surrogate, discussions with consultants, evaluation of patient's response to treatment, examination of patient, ordering and review of laboratory studies, ordering and review of radiographic studies, ordering and performing treatments and interventions, pulse oximetry, re-evaluation of patient's condition, review of old charts, blood draw for specimens and obtaining history from patient or surrogate   I assumed direction of critical care for this patient from another provider in my specialty: no     Care discussed with: admitting provider   Procedure Name: Intubation Date/Time: 05/01/2023 3:50 AM  Performed by: Glynn Octave, MDPre-anesthesia Checklist: Patient identified, Patient being monitored, Emergency Drugs available, Timeout performed and Suction available Oxygen Delivery Method: Non-rebreather mask Preoxygenation: Pre-oxygenation with 100% oxygen Induction Type: Rapid sequence Ventilation: Mask ventilation without difficulty Laryngoscope Size: Glidescope and  4 Grade View: Grade I Tube size: 7.5 mm Number of attempts: 1 Airway Equipment and Method: Stylet and Video-laryngoscopy Placement Confirmation: ETT inserted through vocal cords under direct vision, CO2 detector and Breath sounds checked- equal and bilateral Secured at: 25 cm Tube secured with: ETT holder Dental Injury: Teeth and Oropharynx as per pre-operative assessment  Difficulty Due To: Difficulty was anticipated and Difficult Airway- due to limited oral opening Future Recommendations: Recommend- induction with short-acting agent, and alternative techniques readily available        Medications Ordered in ED Medications  lactated ringers infusion (has no administration in time range)  lactated ringers bolus 1,000 mL (has no administration in time range)  ceFEPIme (MAXIPIME) 2 g in sodium chloride 0.9 % 100 mL IVPB (has no administration in time range)  metroNIDAZOLE (FLAGYL) IVPB 500 mg (has no administration in time range)  vancomycin (VANCOREADY) IVPB 1500 mg/300 mL (has no administration in time range)  acetaminophen (TYLENOL) suppository 975 mg (has no administration in time range)    ED Course/ Medical Decision Making/ A&P                                 Medical Decision Making Amount and/or Complexity of Data Reviewed Independent Historian: EMS Labs: ordered. Decision-making details documented in ED Course. Radiology: ordered and independent interpretation performed. Decision-making details documented in ED Course. ECG/medicine tests: ordered and independent interpretation performed. Decision-making details documented in ED Course.  Risk Prescription drug management. Decision regarding hospitalization.   Altered mental status with concern for sepsis.  Patient febrile, tachycardic, hypoxic, rhonchorous breath sounds with concern for aspiration.  Code sepsis Was activated on arrival.  Patient given IV fluids and broad-spectrum antibiotics after cultures are  obtained.  No DNR on file.  Will discuss with family goals of care.  May require intubation for airway protection.  Patient's significant other Clydie Braun has arrived.  She states he has an advanced directive but does not know the  details of it.  She is not certain whether he would want to be on life support.  Discussed with Clydie Braun at bedside as well as her son Tomma Lightning by phone.  They both feel patient would want to be on life support if it could help him survive this illness.  Will proceed with intubation for airway protection at this time.  Patient intubated for airway protection after discussion with family.  Sepsis protocol continue with IV fluids, IV antibiotics after cultures are obtained.  Hypotensive after intubation we will give additional fluids and begin Levophed.  Amiodarone drip initiated for A-fib with RVR.  Blood pressure improved with Levophed and additional fluids. Work up consistent with sepsis likely secondary to aspiration pneumonia.  Patient also influenza positive ABG without significant CO2 retention.  Admission discussed with critical care Dr. Gaynell Face and NP Bowser.        Final Clinical Impression(s) / ED Diagnoses Final diagnoses:  None    Rx / DC Orders ED Discharge Orders     None         Cinzia Devos, Jeannett Senior, MD 05/01/23 [phone]

## 2023-05-01 NOTE — ED Notes (Signed)
 [facility] not working to [employer]. Blood cultures done from 2 different sites and sent to lab.

## 2023-05-01 NOTE — Progress Notes (Addendum)
 NAME:  [name] Corp., MRN:  [id number], DOB:  1940-07-24, LOS: 0 ADMISSION DATE:  05/01/2023, CONSULTATION DATE:  05/01/23 REFERRING MD:  Rancour, CHIEF COMPLAINT:  AMS   History of Present Illness:  83 yo M PMH of multiple myeloma (on daratumumuab and dexamethasone) , Afib (pradaxa, metop & dilt), HTN presented to [facility] ED 3/3 w AMS, associated fever & hypoxia. Was found w gurgling respirations. Intubated in ED as he was obtunded -- this was /w family before hand. Flu A positive. Febrile, c/f sepsis started on empiric abx. PCCM consulted for admission.     Pertinent  Medical History  Multiple myeloma Afib HLD HTN OSA AAA BPH RCC L kidney  Significant Hospital Events: Including procedures, antibiotic start and stop dates in addition to other pertinent events   3/3 ED w AMS fever hypoxia. Intubated. Admit to ICU w Flu A    Interim History / Subjective:  Sedated after propofol now on Precedex. Fever resolved.   Objective   Blood pressure 125/66, pulse 69, temperature 99.5 F (37.5 C), resp. rate 18, height 5\' 9"  (1.753 m), weight 82.8 kg, SpO2 100%.    Vent Mode: PRVC FiO2 (%):  [100 %] 100 % Set Rate:  [18 bmp] 18 bmp Vt Set:  [600 mL] 600 mL PEEP:  [5 cmH20] 5 cmH20 Plateau Pressure:  [26 cmH20] 26 cmH20   Intake/Output Summary (Last 24 hours) at 05/01/2023 6295 Last data filed at 05/01/2023 0700 Gross per 24 hour  Intake 4419.35 ml  Output 280 ml  Net 4139.35 ml   Filed Weights   05/01/23 0247  Weight: 82.8 kg    Examination: General: Sedated HENT: ETT in place Lungs: vented, coarse breath sounds Cardiovascular: irregular rhythm, normal rate Abdomen: soft, mildly distended  Extremities: no LE edema Neuro: sedated GU: foley  Resolved Hospital Problem list     Assessment & Plan:   Acute encephalopathy Septic shock 2/2 Influenza A infection and c/f Aspiration PNA Acute hypoxic respiratory failure  Lactic acidosis -on levophed for MAP goal >65, wean as  tolerated -VAP, pulm hygiene -PAD: precedex / PRN fent for RASS -1/-2 -Tamiflu x 5 days  -s/p vanc/flagyl/cefepime, given immunosuppressed, continue Zosyn, vanc and azithromycin  -pending MRSA nares swab -f/u respiratory asp cx and Bcx  -trend lactic acid  Afib with RVR -rate controlled  -on Amio drip  -on heparin gtt per pharmacy  -hold home meds of Pradaxa, metop and dilt   Multiple myeloma (looks like a q28d cycle of daratumumab + dexamethasone, follows at [facility]) -outpatient oncology  -continue BID acyclovir for HSV/VZV suppression  Hx HTN  -holding home antihypertensives (chlorthalidone, dilt, metop)  BPH  -foley in place  -monitor UOP   Dementia, with gait instability  -home namenda, consider stopping home aricept given bradycardia  OSA on CPAP Mild asthma  -continue brovana neb, budesonide neb, singulair at bedtime    Best Practice (right click and "Reselect all SmartList Selections" daily)   Diet/type: NPO DVT prophylaxis systemic heparin Pressure ulcer(s): present on admission  GI prophylaxis: PPI Lines: N/A Foley:  Yes, and it is still needed Code Status:  full code Last date of multidisciplinary goals of care discussion [pending ]  Labs   CBC: Recent Labs  Lab 05/01/23 0248 05/01/23 0314  WBC 6.1  --   NEUTROABS 5.7  --   HGB 13.3 11.9*  HCT 38.5* 35.0*  MCV 93.9  --   PLT 100*  --     Basic  Metabolic Panel: Recent Labs  Lab 05/01/23 0248 05/01/23 0314  NA 136 134*  K 3.9 3.6  CL 99  --   CO2 26  --   GLUCOSE 175*  --   BUN 14  --   CREATININE 1.24  --   CALCIUM 9.0  --    GFR: Estimated Creatinine Clearance: 44.3 mL/min (by C-G formula based on SCr of 1.24 mg/dL). Recent Labs  Lab 05/01/23 0248 05/01/23 0259 05/01/23 0445 05/01/23 0507  WBC 6.1  --   --   --   LATICACIDVEN  --  1.8 2.0* 2.2*    Liver Function Tests: Recent Labs  Lab 05/01/23 0248  AST 26  ALT 19  ALKPHOS 65  BILITOT 1.1  PROT 6.3*   ALBUMIN 3.5   No results for input(s): "LIPASE", "AMYLASE" in the last 168 hours. No results for input(s): "AMMONIA" in the last 168 hours.  ABG    Component Value Date/Time   PHART 7.441 05/01/2023 0314   PCO2ART 34.2 05/01/2023 0314   PO2ART 72 (L) 05/01/2023 0314   HCO3 22.6 05/01/2023 0314   TCO2 24 05/01/2023 0314   O2SAT 92 05/01/2023 0314     Coagulation Profile: Recent Labs  Lab 05/01/23 0248  INR 1.1    Cardiac Enzymes: No results for input(s): "CKTOTAL", "CKMB", "CKMBINDEX", "TROPONINI" in the last 168 hours.  HbA1C: Hgb A1c MFr Bld  Date/Time Value Ref Range Status  05/01/2023 04:45 AM 5.3 4.8 - 5.6 % Final    Comment:    (NOTE) Pre diabetes:          5.7%-6.4%  Diabetes:              >6.4%  Glycemic control for   <7.0% adults with diabetes     CBG: No results for input(s): "GLUCAP" in the last 168 hours.  Review of Systems:   sedated  Past Medical History:  He,  has a past medical history of Allergy, Arrhythmia, Atrial fibrillation (HCC), Barrett esophagus, Barrett's esophagus, Blood transfusion without reported diagnosis, BPH (benign prostatic hyperplasia), Cervical stenosis of spine, Chronic obstructive asthma, Dementia (HCC), Difficult intubation (13 or 14 years ago, at [facility] long), ED (erectile dysfunction), GERD (gastroesophageal reflux disease), Hammer toe of right foot, Hiatal hernia, Hyperlipidemia, Hypertension, OSA (obstructive sleep apnea) (08/05/10 PSG), Paroxysmal atrial fibrillation (HCC), Renal cell carcinoma (2009), and Sleep apnea.   Surgical History:   Past Surgical History:  Procedure Laterality Date   BASAL CELL CARCINOMA EXCISION     BIOPSY  12/18/2017   Procedure: BIOPSY;  Surgeon: Meryl Dare, MD;  Location: [facility] ENDOSCOPY;  Service: Endoscopy;;   ESOPHAGOGASTRODUODENOSCOPY (EGD) WITH PROPOFOL N/A 08/26/2014   Procedure: ESOPHAGOGASTRODUODENOSCOPY (EGD) WITH PROPOFOL;  Surgeon: Meryl Dare, MD;  Location: [facility]  ENDOSCOPY;  Service: Endoscopy;  Laterality: N/A;   ESOPHAGOGASTRODUODENOSCOPY (EGD) WITH PROPOFOL N/A 12/18/2017   Procedure: ESOPHAGOGASTRODUODENOSCOPY (EGD) WITH PROPOFOL;  Surgeon: Meryl Dare, MD;  Location: [facility] ENDOSCOPY;  Service: Endoscopy;  Laterality: N/A;   EXPLORATORY LAPAROTOMY W/ BOWEL RESECTION  12/1999   segmental sbr of a hemangioma, repair of incarcerated umbilical hernia   exploratory laparotomy, segmental small bowel resection of a hemangioma, repair of incarcerated umbelical hernia  12/1999   EYE SURGERY     cataracts done   renal cancer surgery with RF Ablation     TONSILLECTOMY       Social History:   reports that he quit smoking about 43 years ago. His smoking use included cigars.  He has been exposed to tobacco smoke. He has never used smokeless tobacco. He reports that he does not drink alcohol and does not use drugs.   Family History:  His family history includes Asthma in his paternal grandmother; Diabetes in his brother, father, and mother; Heart attack in his paternal grandfather; Kidney cancer in his paternal aunt; Obesity in his brother; Stroke in his mother. There is no history of Colon cancer, Esophageal cancer, Rectal cancer, Stomach cancer, Dementia, or Alzheimer's disease.   Allergies Allergies  Allergen Reactions   Proair Hfa [Albuterol] Palpitations    Only use in emergency due to has heart problems     Home Medications  Prior to Admission medications   Medication Sig Start Date End Date Taking? Authorizing Provider  acyclovir (ZOVIRAX) 400 MG tablet Take 400 mg by mouth 2 (two) times daily. 01/05/20   [provider]  alfuzosin (UROXATRAL) 10 MG 24 hr tablet Take 1 tablet by mouth every evening. 08/11/17   [provider]  AMBULATORY NON FORMULARY MEDICATION CPAP at night    [provider]  benzonatate (TESSALON) 100 MG capsule Take by mouth every 8 (eight) hours as needed. 03/13/23   [provider]  calcium  citrate-vitamin D (CITRACAL+D) 315-200 MG-UNIT tablet Take 2 tablets by mouth daily. 03/12/20   [provider]  CARTIA XT 120 MG 24 hr capsule Take 120 mg by mouth every morning. 12/31/22   [provider]  chlorthalidone (HYGROTON) 25 MG tablet Take 25 mg by mouth as needed (As needed per wife).    [provider]  DARATUMUMAB IV Inject 1 Dose into the skin every 30 (thirty) days.    [provider]  donepezil (ARICEPT) 10 MG tablet Take 10 mg by mouth daily.  06/27/19   [provider]  dutasteride (AVODART) 0.5 MG capsule Take 0.5 mg by mouth every evening. 02/26/15   [provider]  fluticasone-salmeterol (ADVAIR) 250-50 MCG/ACT AEPB Inhale 1 puff into the lungs 2 (two) times daily.    [provider]  Melatonin 1 MG CAPS Take by mouth.    [provider]  memantine (NAMENDA) 10 MG tablet Take 10 mg by mouth 2 (two) times daily. 12/06/19   [provider]  metoprolol succinate (TOPROL-XL) 25 MG 24 hr tablet Take 25 mg by mouth every morning.    [provider]  montelukast (SINGULAIR) 10 MG tablet TAKE 1 TABLET(10 MG) BY MOUTH AT BEDTIME Patient taking differently: Take 10 mg by mouth in the morning. 01/29/18   Coralyn Helling, MD  Multiple Vitamins-Minerals (MULTIVITAMIN WITH MINERALS) tablet Take 1 tablet by mouth daily.    [provider]  omeprazole (PRILOSEC) 20 MG capsule TAKE ONE CAPSULE BY MOUTH EVERY DAY 11/29/16   Meryl Dare, MD  potassium chloride (MICRO-K) 10 MEQ CR capsule Take 20 mEq by mouth daily. 01/27/22   [provider]  PRADAXA 150 MG CAPS capsule TAKE ONE CAPSULE BY MOUTH TWICE DAILY 01/02/23   [place], Peter M, MD  pravastatin (PRAVACHOL) 20 MG tablet Take 20 mg daily by mouth.    [provider]  Respiratory Therapy Supplies (ACE AEROSOL CLOUD ENHANCER) MISC Inhale into the lungs See admin instructions. 02/06/23   [provider]  tadalafil (CIALIS) 5  MG tablet Take 1 tablet by mouth daily. 09/16/20   [provider]  zoledronic acid (ZOMETA) 4 MG/5ML injection Inject 4 mg into the vein once. Yearly    [provider]  Critical care time:      Signature: Rana Snare, DO Internal Medicine Resident PGY-2

## 2023-05-01 NOTE — Progress Notes (Addendum)
 eLink Physician-Brief Progress Note Patient Name: [name]. DOB: 03-18-1940 MRN: [id number]   Date of Service  05/01/2023  HPI/Events of Note  83 yo M PMH Multiple myeloma (on daratumumuab and dexamethasone) , Afib (pradaxa, metop & dilt), HTN presented to [facility] ED 3/3 w AMS, associated fever & hypoxia , now in ICU  Encephalopathy from septic shock, immunosuppressed from her Multiple myeloma treatment-asp pneumonitis. On Ventilator, levophed. Flu +. S/p fluids. On tamiflu, unasyn. Viral prophylaxis.  Mild dementia, OSA. A fib rate controled.HTN. heparin, amiodarone  drip.    Camera: In synchrony with vent. Lung protective ventilation. 600/5/18/100%. PIP 20. Temp 104. HR 82, MAP 72  Data: Reviewed Trop 23 to 31, LA stable at 2.2 KUB: NG port at GE jn, tip near stomach. Cxr image seen: ET in place. RLL asp pneumonia.  Cr 1.4 Wbc normal. Hg 11.9 Covid, RSV neg.  UA not done yet   eICU Interventions  Push in NG tube by 5 cm.  Propofol infusion protocol re ordered Bilateral soft wrist restraints request from bed side RN to prevent self injury and harm while critically ill, altered mentation. .  On ssi, goals CBG < 180. Colace       Intervention Category Major Interventions: Sepsis - evaluation and management;Respiratory failure - evaluation and management Evaluation Type: New Patient Evaluation  [name] 05/01/2023, 5:44 AM

## 2023-05-02 DIAGNOSIS — E8729 Other acidosis: Secondary | ICD-10-CM | POA: Diagnosis not present

## 2023-05-02 DIAGNOSIS — R739 Hyperglycemia, unspecified: Secondary | ICD-10-CM

## 2023-05-02 DIAGNOSIS — J09X2 Influenza due to identified novel influenza A virus with other respiratory manifestations: Secondary | ICD-10-CM | POA: Diagnosis not present

## 2023-05-02 DIAGNOSIS — I4891 Unspecified atrial fibrillation: Secondary | ICD-10-CM | POA: Diagnosis not present

## 2023-05-02 DIAGNOSIS — J9601 Acute respiratory failure with hypoxia: Secondary | ICD-10-CM | POA: Diagnosis not present

## 2023-05-02 DIAGNOSIS — N400 Enlarged prostate without lower urinary tract symptoms: Secondary | ICD-10-CM

## 2023-05-02 LAB — BASIC METABOLIC PANEL WITH GFR
Anion gap: 13 (ref 5–15)
BUN: 14 mg/dL (ref 8–23)
CO2: 21 mmol/L — ABNORMAL LOW (ref 22–32)
Calcium: 8.1 mg/dL — ABNORMAL LOW (ref 8.9–10.3)
Chloride: 102 mmol/L (ref 98–111)
Creatinine, Ser: 1.37 mg/dL — ABNORMAL HIGH (ref 0.61–1.24)
GFR, Estimated: 51 mL/min — ABNORMAL LOW
Glucose, Bld: 102 mg/dL — ABNORMAL HIGH (ref 70–99)
Potassium: 4 mmol/L (ref 3.5–5.1)
Sodium: 136 mmol/L (ref 135–145)

## 2023-05-02 LAB — CBC
HCT: 35.3 % — ABNORMAL LOW (ref 39.0–52.0)
Hemoglobin: 12 g/dL — ABNORMAL LOW (ref 13.0–17.0)
MCH: 32.5 pg (ref 26.0–34.0)
MCHC: 34 g/dL (ref 30.0–36.0)
MCV: 95.7 fL (ref 80.0–100.0)
Platelets: 83 10*3/uL — ABNORMAL LOW (ref 150–400)
RBC: 3.69 MIL/uL — ABNORMAL LOW (ref 4.22–5.81)
RDW: 16.2 % — ABNORMAL HIGH (ref 11.5–15.5)
WBC: 5.1 10*3/uL (ref 4.0–10.5)
nRBC: 0 % (ref 0.0–0.2)

## 2023-05-02 LAB — GLUCOSE, CAPILLARY
Glucose-Capillary: 113 mg/dL — ABNORMAL HIGH (ref 70–99)
Glucose-Capillary: 113 mg/dL — ABNORMAL HIGH (ref 70–99)
Glucose-Capillary: 115 mg/dL — ABNORMAL HIGH (ref 70–99)
Glucose-Capillary: 118 mg/dL — ABNORMAL HIGH (ref 70–99)
Glucose-Capillary: 118 mg/dL — ABNORMAL HIGH (ref 70–99)
Glucose-Capillary: 121 mg/dL — ABNORMAL HIGH (ref 70–99)

## 2023-05-02 LAB — LACTIC ACID, PLASMA
Lactic Acid, Venous: 3.1 mmol/L (ref 0.5–1.9)
Lactic Acid, Venous: 3.2 mmol/L (ref 0.5–1.9)
Lactic Acid, Venous: 2.1 mmol/L (ref 0.5–1.9)
Lactic Acid, Venous: 1.9 mmol/L (ref 0.5–1.9)

## 2023-05-02 LAB — MRSA NEXT GEN BY PCR, NASAL: MRSA by PCR Next Gen: NOT DETECTED

## 2023-05-02 LAB — TRIGLYCERIDES: Triglycerides: 60 mg/dL (ref <150)

## 2023-05-02 LAB — HEPARIN LEVEL (UNFRACTIONATED): Heparin Unfractionated: 0.41 [IU]/mL (ref 0.30–0.70)

## 2023-05-02 MED ORDER — FUROSEMIDE 10 MG/ML IJ SOLN
60.0000 mg | Freq: Once | INTRAMUSCULAR | Status: AC
  Administered 2023-05-02: 60 mg via INTRAVENOUS
  Filled 2023-05-02: qty 6

## 2023-05-02 MED ORDER — PHENOL 1.4 % MT LIQD
1.0000 | OROMUCOSAL | Status: DC | PRN
  Filled 2023-05-02: qty 177

## 2023-05-02 NOTE — Progress Notes (Signed)
 Passed SBT on 8/5 for 30 min. Extubation orders placed, d/c precedex. D/w RN & RT. S/o updated at bedside.  Steffanie Dunn, DO 05/02/23 10:39 AM [facility]  For contact information, see Amion. If no response to pager, please call PCCM consult pager. After hours, 7PM- 7AM, please call Elink.

## 2023-05-02 NOTE — Progress Notes (Signed)
 PHARMACY - ANTICOAGULATION CONSULT NOTE  Pharmacy Consult for Heparin infusion Indication: atrial fibrillation  Allergies  Allergen Reactions   Proair Hfa [Albuterol] Palpitations    Only use in emergency due to has heart problems    Patient Measurements: Height: 5' 9.02" (175.3 cm) Weight: 84.2 kg (185 lb 10 oz) IBW/kg (Calculated) : 70.74 Heparin Dosing Weight: 82.8 kg  Vital Signs: Temp: 99.5 F (37.5 C) (03/04 0700) BP: 125/105 (03/04 0700) Pulse Rate: 63 (03/04 0700)  Labs: Recent Labs    05/01/23 0248 05/01/23 0314 05/01/23 0445 05/01/23 2102  HGB 13.3 11.9*  --   --   HCT 38.5* 35.0*  --   --   PLT 100*  --   --   --   APTT 28  --   --   --   LABPROT 14.6  --   --   --   INR 1.1  --   --   --   HEPARINUNFRC  --   --   --  0.36  CREATININE 1.24  --   --   --   TROPONINIHS 23*  --  31*  --     Estimated Creatinine Clearance: 44.3 mL/min (by C-G formula based on SCr of 1.24 mg/dL).   Medical History: Past Medical History:  Diagnosis Date   Allergy    Arrhythmia    Atrial fibrillation (HCC)    Barrett esophagus    Barrett's esophagus    Blood transfusion without reported diagnosis    BPH (benign prostatic hyperplasia)    Cervical stenosis of spine    Chronic obstructive asthma    Dementia (HCC)    Difficult intubation 13 or 14 years ago, at [facility]   airway was up near nostrils with intubation per patient with bowel surgery   ED (erectile dysfunction)    GERD (gastroesophageal reflux disease)    Hammer toe of right foot    Hiatal hernia    Hyperlipidemia    Hypertension    OSA (obstructive sleep apnea) 08/05/10 PSG   AHI 24   Paroxysmal atrial fibrillation (HCC)    Hospitalized on 05/27/10   Renal cell carcinoma 2009   right renal mass, followed by Dr. Teodora Medici   Sleep apnea    Assessment: 49 YOM presented to the ED with AMS, fever, and hypoxia. PTA Pradaxa 150 mg PO BID (LD unknown). Pharmacy consulted to dose heparin for atrial  fibrillation. HL therapeutic at 0.36 @1100  units/hr, confirmatory HL this AM therapeutic at 0.41. Plts 83 and trending down, Hgb stable at 12. Per nurse oozing from line site when pulled, otherwise stable and no overt signs of bleeding.  Goal of Therapy:  Heparin level 0.3-0.7 units/ml Monitor platelets by anticoagulation protocol: Yes   Plan:  Continue heparin infusion at 1100 units/hr Continue to monitor H&H and platelets, S/Sx of bleeding  Check HL and CBC daily   Laqueta Jean PharmD Candidate 05/02/2023 8:42 AM

## 2023-05-02 NOTE — Progress Notes (Signed)
 Mr. [name] is an 83 y/o gentleman with flu and resipratory failure, intubated originally for AMS. Tmax 101.7.   BP 132/81   Pulse 62   Temp 99.5 F (37.5 C)   Resp (!) 21   Ht 5' 9.02" (1.753 m)   Wt 84.2 kg   SpO2 99%   BMI 27.40 kg/m  Critically ill appearing man lying in bed in NAD [place]/AT, eyes anicteric, ETT in place Breathing comfortably on MV, occasional thick secretions, CTAB S1S2, reg rate, irreg rhythm Abd soft, NT Mild edema Awake, followed commands x 4 extremities  LA 3.1 Rest of labs pending  Resp culture: abdundant PMN, no organisms>  Blood culture> NGTD    Assessment & plan:  Acute respiratory failure with hypoxia requiring MV Flu A H/o allergic asthma -LVV -VAP prevention protocol -PAD protocol -SAT & SBT; now on 8/5, hopeful for extubation today. D/o updated at bedside 90% chance of successful extubation if he passes SBT, but 10% chance of failure. -con't tamiflu -con't broad spectrum antibiotics since he is immunocompromised- cefepime, vanc, azithro -lasix x 1  -singulair, antihistamine, ICS, LABA nebs  Lactic acidosis, improving -avoid propofol -con't antibiotics  Afib, rate controlled -holding PTA pradaxa; con't heparin -hold PTA dilt -con't amiodarone; can add bblcoker if needed -con't amio-- if doing well post extubation can transition to orals  BPH -foley  Dementia -hold aricept  Hyperglycemia -SSI PRN -goal BG 140-180  H/o HSV -acyclovir  Dr. Achille Rich to update family later today. S/o updated at bedside this morning.   This patient is critically ill with multiple organ system failure which requires frequent high complexity decision making, assessment, support, evaluation, and titration of therapies. This was completed through the application of advanced monitoring technologies and extensive interpretation of multiple databases. During this encounter critical care time was devoted to patient care services described in this note for  45 minutes.  [name] Dunn, DO 05/02/23 10:12 AM [facility]  For contact information, see Amion. If no response to pager, please call PCCM consult pager. After hours, 7PM- 7AM, please call Elink.

## 2023-05-02 NOTE — Progress Notes (Addendum)
 NAME:  [name]., MRN:  [id number], DOB:  05-05-1940, LOS: 1 ADMISSION DATE:  05/01/2023, CONSULTATION DATE:  05/01/23 REFERRING MD:  Rancour, CHIEF COMPLAINT:  AMS   History of Present Illness:  83 yo M PMH of multiple myeloma (on daratumumuab and dexamethasone) , Afib (pradaxa, metop & dilt), HTN presented to [facility] ED 3/3 w AMS, associated fever & hypoxia. Was found w gurgling respirations. Intubated in ED as he was obtunded -- this was /w family before hand. Flu A positive. Febrile, c/f sepsis started on empiric abx. PCCM consulted for admission.     Pertinent  Medical History  Multiple myeloma Afib HLD HTN OSA AAA BPH RCC L kidney  Significant Hospital Events: Including procedures, antibiotic start and stop dates in addition to other pertinent events   3/3 ED w AMS fever hypoxia. Intubated. Admit to ICU w Flu A  3/3 off of levophed  Interim History / Subjective:  Fever overnight. Awake and follows commands. Vented on 50% FiO2.   Objective   Blood pressure 121/66, pulse 72, temperature 99.3 F (37.4 C), resp. rate 18, height 5' 9.02" (1.753 m), weight 84.2 kg, SpO2 99%.    Vent Mode: PRVC FiO2 (%):  [50 %-100 %] 50 % Set Rate:  [18 bmp] 18 bmp Vt Set:  [560 mL] 560 mL PEEP:  [5 cmH20] 5 cmH20 Plateau Pressure:  [20 cmH20-23 cmH20] 20 cmH20   Intake/Output Summary (Last 24 hours) at 05/02/2023 0631 Last data filed at 05/02/2023 0600 Gross per 24 hour  Intake 4097.73 ml  Output 1620 ml  Net 2477.73 ml   Filed Weights   05/01/23 0247 05/01/23 0515 05/02/23 0200  Weight: 82.8 kg 82.8 kg 84.2 kg    Examination: General: Awakens to voice, in no acute distress HENT: ETT in place Lungs: vented, coarse breath sounds Cardiovascular: irregular rhythm, normal rate Abdomen: soft, mildly distended, non-tender Extremities: no LE edema Neuro: awake to voice, follow commands GU: foley  Resolved Hospital Problem list     Assessment & Plan:   Acute  encephalopathy Septic shock 2/2 Influenza A infection and c/f Aspiration PNA Acute hypoxic respiratory failure  Lactic acidosis -off vasopressors 3/3 -VAP, pulm hygiene -PAD: precedex / PRN fent for RASS -1/-2 -SBT daily, wean down FiO2 as tolerated  -continue Tamiflu x 5 days  -continue Zosyn, vanc and azithromycin  -pending MRSA nares swab -f/u respiratory asp cx: no organisms seen and Bcx: so far no growth -trend lactic acid    Afib with RVR -rate controlled  -on Amio drip  -on heparin gtt per pharmacy  -hold home meds of Pradaxa, metop and dilt   Multiple myeloma (looks like a q28d cycle of daratumumab + dexamethasone, follows at [facility]) -outpatient oncology  -continue BID acyclovir for HSV/VZV suppression  Hx HTN  -holding home antihypertensives (chlorthalidone, dilt, metop)  BPH  -foley in place  -monitor UOP   Dementia, with gait instability  -home namenda, stopped home aricept given bradycardia  OSA on CPAP Mild asthma  -continue brovana neb, budesonide neb, singulair at bedtime   Anemia Thrombocytopenia -chronic and close to baseline -trend CBC   Best Practice (right click and "Reselect all SmartList Selections" daily)   Diet/type: NPO DVT prophylaxis systemic heparin Pressure ulcer(s): present on admission  GI prophylaxis: PPI Lines: N/A Foley:  Yes, and it is still needed Code Status:  full code Last date of multidisciplinary goals of care discussion [updated significant other and children 3/3 ]  Labs  CBC: Recent Labs  Lab 05/01/23 0248 05/01/23 0314  WBC 6.1  --   NEUTROABS 5.7  --   HGB 13.3 11.9*  HCT 38.5* 35.0*  MCV 93.9  --   PLT 100*  --     Basic Metabolic Panel: Recent Labs  Lab 05/01/23 0248 05/01/23 0314 05/01/23 0445  NA 136 134*  --   K 3.9 3.6  --   CL 99  --   --   CO2 26  --   --   GLUCOSE 175*  --   --   BUN 14  --   --   CREATININE 1.24  --   --   CALCIUM 9.0  --   --   MG  --   --  1.6*    GFR: Estimated Creatinine Clearance: 44.3 mL/min (by C-G formula based on SCr of 1.24 mg/dL). Recent Labs  Lab 05/01/23 0248 05/01/23 0259 05/01/23 0507 05/01/23 0949 05/01/23 1227 05/01/23 1524  WBC 6.1  --   --   --   --   --   LATICACIDVEN  --    < > 2.2* 3.1* 3.8* 3.8*   < > = values in this interval not displayed.    Liver Function Tests: Recent Labs  Lab 05/01/23 0248  AST 26  ALT 19  ALKPHOS 65  BILITOT 1.1  PROT 6.3*  ALBUMIN 3.5   No results for input(s): "LIPASE", "AMYLASE" in the last 168 hours. No results for input(s): "AMMONIA" in the last 168 hours.  ABG    Component Value Date/Time   PHART 7.441 05/01/2023 0314   PCO2ART 34.2 05/01/2023 0314   PO2ART 72 (L) 05/01/2023 0314   HCO3 22.6 05/01/2023 0314   TCO2 24 05/01/2023 0314   O2SAT 92 05/01/2023 0314     Coagulation Profile: Recent Labs  Lab 05/01/23 0248  INR 1.1    Cardiac Enzymes: No results for input(s): "CKTOTAL", "CKMB", "CKMBINDEX", "TROPONINI" in the last 168 hours.  HbA1C: Hgb A1c MFr Bld  Date/Time Value Ref Range Status  05/01/2023 04:45 AM 5.3 4.8 - 5.6 % Final    Comment:    (NOTE) Pre diabetes:          5.7%-6.4%  Diabetes:              >6.4%  Glycemic control for   <7.0% adults with diabetes     CBG: Recent Labs  Lab 05/01/23 1116 05/01/23 1519 05/01/23 1940 05/01/23 2329 05/02/23 0336  GLUCAP 158* 139* 115* 106* 121*    Review of Systems:   sedated  Past Medical History:  He,  has a past medical history of Allergy, Arrhythmia, Atrial fibrillation (HCC), Barrett esophagus, Barrett's esophagus, Blood transfusion without reported diagnosis, BPH (benign prostatic hyperplasia), Cervical stenosis of spine, Chronic obstructive asthma, Dementia (HCC), Difficult intubation (13 or 14 years ago, at [facility] long), ED (erectile dysfunction), GERD (gastroesophageal reflux disease), Hammer toe of right foot, Hiatal hernia, Hyperlipidemia, Hypertension, OSA  (obstructive sleep apnea) (08/05/10 PSG), Paroxysmal atrial fibrillation (HCC), Renal cell carcinoma (2009), and Sleep apnea.   Surgical History:   Past Surgical History:  Procedure Laterality Date   BASAL CELL CARCINOMA EXCISION     BIOPSY  12/18/2017   Procedure: BIOPSY;  Surgeon: Meryl Dare, MD;  Location: [facility];  Service: Endoscopy;;   ESOPHAGOGASTRODUODENOSCOPY (EGD) WITH PROPOFOL N/A 08/26/2014   Procedure: ESOPHAGOGASTRODUODENOSCOPY (EGD) WITH PROPOFOL;  Surgeon: Meryl Dare, MD;  Location: [facility];  Service: Endoscopy;  Laterality: N/A;   ESOPHAGOGASTRODUODENOSCOPY (EGD) WITH PROPOFOL N/A 12/18/2017   Procedure: ESOPHAGOGASTRODUODENOSCOPY (EGD) WITH PROPOFOL;  Surgeon: Meryl Dare, MD;  Location: [facility];  Service: Endoscopy;  Laterality: N/A;   EXPLORATORY LAPAROTOMY W/ BOWEL RESECTION  12/1999   segmental sbr of a hemangioma, repair of incarcerated umbilical hernia   exploratory laparotomy, segmental small bowel resection of a hemangioma, repair of incarcerated umbelical hernia  12/1999   EYE SURGERY     cataracts done   renal cancer surgery with RF Ablation     TONSILLECTOMY       Social History:   reports that he quit smoking about 43 years ago. His smoking use included cigars. He has been exposed to tobacco smoke. He has never used smokeless tobacco. He reports that he does not drink alcohol and does not use drugs.   Family History:  His family history includes Asthma in his paternal grandmother; Diabetes in his brother, father, and mother; Heart attack in his paternal grandfather; Kidney cancer in his paternal aunt; Obesity in his brother; Stroke in his mother. There is no history of Colon cancer, Esophageal cancer, Rectal cancer, Stomach cancer, Dementia, or Alzheimer's disease.   Allergies Allergies  Allergen Reactions   Proair Hfa [Albuterol] Palpitations    Only use in emergency due to has heart problems     Home Medications  Prior  to Admission medications   Medication Sig Start Date End Date Taking? Authorizing Provider  acyclovir (ZOVIRAX) 400 MG tablet Take 400 mg by mouth 2 (two) times daily. 01/05/20   [provider]  alfuzosin (UROXATRAL) 10 MG 24 hr tablet Take 1 tablet by mouth every evening. 08/11/17   [provider]  AMBULATORY NON FORMULARY MEDICATION CPAP at night    [provider]  benzonatate (TESSALON) 100 MG capsule Take by mouth every 8 (eight) hours as needed. 03/13/23   [provider]  calcium citrate-vitamin D (CITRACAL+D) 315-200 MG-UNIT tablet Take 2 tablets by mouth daily. 03/12/20   [provider]  CARTIA XT 120 MG 24 hr capsule Take 120 mg by mouth every morning. 12/31/22   [provider]  chlorthalidone (HYGROTON) 25 MG tablet Take 25 mg by mouth as needed (As needed per wife).    [provider]  DARATUMUMAB IV Inject 1 Dose into the skin every 30 (thirty) days.    [provider]  donepezil (ARICEPT) 10 MG tablet Take 10 mg by mouth daily.  06/27/19   [provider]  dutasteride (AVODART) 0.5 MG capsule Take 0.5 mg by mouth every evening. 02/26/15   [provider]  fluticasone-salmeterol (ADVAIR) 250-50 MCG/ACT AEPB Inhale 1 puff into the lungs 2 (two) times daily.    [provider]  Melatonin 1 MG CAPS Take by mouth.    [provider]  memantine (NAMENDA) 10 MG tablet Take 10 mg by mouth 2 (two) times daily. 12/06/19   [provider]  metoprolol succinate (TOPROL-XL) 25 MG 24 hr tablet Take 25 mg by mouth every morning.    [provider]  montelukast (SINGULAIR) 10 MG tablet TAKE 1 TABLET(10 MG) BY MOUTH AT BEDTIME Patient taking differently: Take 10 mg by mouth in the morning. 01/29/18   Coralyn Helling, MD  Multiple Vitamins-Minerals (MULTIVITAMIN WITH MINERALS) tablet Take 1 tablet by mouth daily.    [provider]  omeprazole (PRILOSEC) 20 MG capsule TAKE  ONE CAPSULE BY MOUTH EVERY DAY 11/29/16   Meryl Dare, MD  potassium chloride (MICRO-K) 10 MEQ CR capsule Take 20 mEq by mouth daily. 01/27/22   [provider]  PRADAXA 150 MG CAPS capsule TAKE ONE CAPSULE BY MOUTH TWICE DAILY 01/02/23   [place], Peter M, MD  pravastatin (PRAVACHOL) 20 MG tablet Take 20 mg daily by mouth.    [provider]  Respiratory Therapy Supplies (ACE AEROSOL CLOUD ENHANCER) MISC Inhale into the lungs See admin instructions. 02/06/23   [provider]  tadalafil (CIALIS) 5 MG tablet Take 1 tablet by mouth daily. 09/16/20   [provider]  zoledronic acid (ZOMETA) 4 MG/5ML injection Inject 4 mg into the vein once. Yearly    [provider]     Critical care time:      Signature: Rana Snare, DO Internal Medicine Resident PGY-2

## 2023-05-02 NOTE — Procedures (Signed)
 Extubation Procedure Note  Patient Details:   Name: [name]. DOB: 01-23-1941 MRN: [id number]   Airway Documentation:    Vent end date: 05/02/23 Vent end time: 1100   Evaluation  O2 sats: stable throughout Complications: No apparent complications Patient did tolerate procedure well. Bilateral Breath Sounds: Rhonchi   Yes  Patient extubated to 4L [place] per Md order.  Positive cuff leak noted.  No evidence of stridor.  Patient able to speak post extubation.  Sats and vitals are currently stable.  No complications noted at this time.    Elyn Peers 05/02/2023, 11:03 AM

## 2023-05-03 DIAGNOSIS — E876 Hypokalemia: Secondary | ICD-10-CM

## 2023-05-03 DIAGNOSIS — D649 Anemia, unspecified: Secondary | ICD-10-CM

## 2023-05-03 DIAGNOSIS — J690 Pneumonitis due to inhalation of food and vomit: Secondary | ICD-10-CM

## 2023-05-03 DIAGNOSIS — R319 Hematuria, unspecified: Secondary | ICD-10-CM

## 2023-05-03 DIAGNOSIS — R5381 Other malaise: Secondary | ICD-10-CM

## 2023-05-03 DIAGNOSIS — J09X1 Influenza due to identified novel influenza A virus with pneumonia: Secondary | ICD-10-CM | POA: Diagnosis not present

## 2023-05-03 DIAGNOSIS — D696 Thrombocytopenia, unspecified: Secondary | ICD-10-CM

## 2023-05-03 DIAGNOSIS — N1831 Chronic kidney disease, stage 3a: Secondary | ICD-10-CM

## 2023-05-03 DIAGNOSIS — E871 Hypo-osmolality and hyponatremia: Secondary | ICD-10-CM

## 2023-05-03 DIAGNOSIS — J9601 Acute respiratory failure with hypoxia: Secondary | ICD-10-CM | POA: Diagnosis not present

## 2023-05-03 DIAGNOSIS — I4891 Unspecified atrial fibrillation: Secondary | ICD-10-CM | POA: Diagnosis not present

## 2023-05-03 LAB — CBC
HCT: 30.8 % — ABNORMAL LOW (ref 39.0–52.0)
Hemoglobin: 10.9 g/dL — ABNORMAL LOW (ref 13.0–17.0)
MCH: 32.5 pg (ref 26.0–34.0)
MCHC: 35.4 g/dL (ref 30.0–36.0)
MCV: 91.9 fL (ref 80.0–100.0)
Platelets: 97 10*3/uL — ABNORMAL LOW (ref 150–400)
RBC: 3.35 MIL/uL — ABNORMAL LOW (ref 4.22–5.81)
RDW: 15.9 % — ABNORMAL HIGH (ref 11.5–15.5)
WBC: 4.7 10*3/uL (ref 4.0–10.5)
nRBC: 0 % (ref 0.0–0.2)

## 2023-05-03 LAB — MAGNESIUM: Magnesium: 2.1 mg/dL (ref 1.7–2.4)

## 2023-05-03 LAB — GLUCOSE, CAPILLARY
Glucose-Capillary: 103 mg/dL — ABNORMAL HIGH (ref 70–99)
Glucose-Capillary: 86 mg/dL (ref 70–99)
Glucose-Capillary: 112 mg/dL — ABNORMAL HIGH (ref 70–99)
Glucose-Capillary: 145 mg/dL — ABNORMAL HIGH (ref 70–99)
Glucose-Capillary: 113 mg/dL — ABNORMAL HIGH (ref 70–99)

## 2023-05-03 LAB — BASIC METABOLIC PANEL
Anion gap: 11 (ref 5–15)
BUN: 16 mg/dL (ref 8–23)
CO2: 23 mmol/L (ref 22–32)
Calcium: 7.4 mg/dL — ABNORMAL LOW (ref 8.9–10.3)
Chloride: 99 mmol/L (ref 98–111)
Creatinine, Ser: 1.49 mg/dL — ABNORMAL HIGH (ref 0.61–1.24)
GFR, Estimated: 46 mL/min — ABNORMAL LOW (ref >60)
Glucose, Bld: 96 mg/dL (ref 70–99)
Potassium: 2.9 mmol/L — ABNORMAL LOW (ref 3.5–5.1)
Sodium: 133 mmol/L — ABNORMAL LOW (ref 135–145)

## 2023-05-03 LAB — CULTURE, RESPIRATORY W GRAM STAIN

## 2023-05-03 LAB — HEPARIN LEVEL (UNFRACTIONATED): Heparin Unfractionated: 0.41 [IU]/mL (ref 0.30–0.70)

## 2023-05-03 MED ORDER — DONEPEZIL HCL 10 MG PO TABS
10.0000 mg | ORAL_TABLET | Freq: Every day | ORAL | Status: DC
  Administered 2023-05-03 – 2023-05-08 (×6): 10 mg via ORAL
  Filled 2023-05-03 (×7): qty 1

## 2023-05-03 MED ORDER — CALCIUM CITRATE 950 (200 CA) MG PO TABS
400.0000 mg | ORAL_TABLET | Freq: Every day | ORAL | Status: DC
  Administered 2023-05-03 – 2023-05-09 (×7): 1900 mg via ORAL
  Filled 2023-05-03 (×7): qty 2

## 2023-05-03 MED ORDER — AZITHROMYCIN 500 MG PO TABS
500.0000 mg | ORAL_TABLET | Freq: Every day | ORAL | Status: AC
  Administered 2023-05-03: 500 mg via ORAL
  Filled 2023-05-03: qty 1

## 2023-05-03 MED ORDER — MONTELUKAST SODIUM 10 MG PO TABS
10.0000 mg | ORAL_TABLET | Freq: Every day | ORAL | Status: DC
  Administered 2023-05-03 – 2023-05-08 (×6): 10 mg via ORAL
  Filled 2023-05-03 (×6): qty 1

## 2023-05-03 MED ORDER — METOPROLOL SUCCINATE ER 25 MG PO TB24
25.0000 mg | ORAL_TABLET | Freq: Every day | ORAL | Status: DC
  Administered 2023-05-03 – 2023-05-09 (×7): 25 mg via ORAL
  Filled 2023-05-03 (×7): qty 1

## 2023-05-03 MED ORDER — DABIGATRAN ETEXILATE MESYLATE 150 MG PO CAPS
150.0000 mg | ORAL_CAPSULE | Freq: Two times a day (BID) | ORAL | Status: DC
  Administered 2023-05-04 – 2023-05-09 (×11): 150 mg via ORAL
  Filled 2023-05-03 (×12): qty 1

## 2023-05-03 MED ORDER — POTASSIUM CHLORIDE CRYS ER 20 MEQ PO TBCR
40.0000 meq | EXTENDED_RELEASE_TABLET | ORAL | Status: AC
  Administered 2023-05-03 (×2): 40 meq via ORAL
  Filled 2023-05-03 (×2): qty 2

## 2023-05-03 MED ORDER — AMOXICILLIN-POT CLAVULANATE 875-125 MG PO TABS
1.0000 | ORAL_TABLET | Freq: Two times a day (BID) | ORAL | Status: AC
  Administered 2023-05-03 – 2023-05-07 (×10): 1 via ORAL
  Filled 2023-05-03 (×11): qty 1

## 2023-05-03 MED ORDER — POLYETHYLENE GLYCOL 3350 17 G PO PACK
17.0000 g | PACK | Freq: Every day | ORAL | Status: DC
  Administered 2023-05-05: 17 g via ORAL
  Filled 2023-05-03 (×5): qty 1

## 2023-05-03 MED ORDER — AMIODARONE LOAD VIA INFUSION
150.0000 mg | Freq: Once | INTRAVENOUS | Status: DC
Start: 2023-05-03 — End: 2023-05-03
  Filled 2023-05-03: qty 83.34

## 2023-05-03 MED ORDER — PANTOPRAZOLE SODIUM 40 MG PO TBEC
40.0000 mg | DELAYED_RELEASE_TABLET | Freq: Every day | ORAL | Status: DC
  Administered 2023-05-03 – 2023-05-08 (×6): 40 mg via ORAL
  Filled 2023-05-03 (×6): qty 1

## 2023-05-03 MED ORDER — PRAVASTATIN SODIUM 10 MG PO TABS
20.0000 mg | ORAL_TABLET | Freq: Every day | ORAL | Status: DC
  Administered 2023-05-03 – 2023-05-08 (×6): 20 mg via ORAL
  Filled 2023-05-03 (×6): qty 2

## 2023-05-03 MED ORDER — INSULIN ASPART 100 UNIT/ML IJ SOLN: 0.0000 [IU] | Freq: Every day | INTRAMUSCULAR | Status: DC

## 2023-05-03 MED ORDER — MEMANTINE HCL 10 MG PO TABS
10.0000 mg | ORAL_TABLET | Freq: Every day | ORAL | Status: DC
  Administered 2023-05-03 – 2023-05-09 (×7): 10 mg via ORAL
  Filled 2023-05-03 (×7): qty 1

## 2023-05-03 MED ORDER — INSULIN ASPART 100 UNIT/ML IJ SOLN
0.0000 [IU] | Freq: Three times a day (TID) | INTRAMUSCULAR | Status: DC
  Administered 2023-05-03 – 2023-05-04 (×2): 1 [IU] via SUBCUTANEOUS
  Administered 2023-05-07: 2 [IU] via SUBCUTANEOUS
  Administered 2023-05-08: 1 [IU] via SUBCUTANEOUS
  Administered 2023-05-08 – 2023-05-09 (×2): 2 [IU] via SUBCUTANEOUS

## 2023-05-03 MED ORDER — OSELTAMIVIR PHOSPHATE 30 MG PO CAPS
30.0000 mg | ORAL_CAPSULE | Freq: Two times a day (BID) | ORAL | Status: AC
  Administered 2023-05-03 – 2023-05-06 (×7): 30 mg via ORAL
  Filled 2023-05-03 (×7): qty 1

## 2023-05-03 MED ORDER — DUTASTERIDE 0.5 MG PO CAPS
0.5000 mg | ORAL_CAPSULE | Freq: Every day | ORAL | Status: DC
  Administered 2023-05-03 – 2023-05-09 (×7): 0.5 mg via ORAL
  Filled 2023-05-03 (×8): qty 1

## 2023-05-03 MED ORDER — DOCUSATE SODIUM 100 MG PO CAPS
100.0000 mg | ORAL_CAPSULE | Freq: Two times a day (BID) | ORAL | Status: DC
  Administered 2023-05-03 – 2023-05-08 (×10): 100 mg via ORAL
  Filled 2023-05-03 (×12): qty 1

## 2023-05-03 MED ORDER — TADALAFIL 5 MG PO TABS
5.0000 mg | ORAL_TABLET | Freq: Every day | ORAL | Status: DC
  Administered 2023-05-03 – 2023-05-09 (×7): 5 mg via ORAL
  Filled 2023-05-03 (×7): qty 1

## 2023-05-03 MED ORDER — ACYCLOVIR 200 MG PO CAPS
400.0000 mg | ORAL_CAPSULE | Freq: Two times a day (BID) | ORAL | Status: DC
  Administered 2023-05-03 – 2023-05-09 (×13): 400 mg via ORAL
  Filled 2023-05-03 (×15): qty 2

## 2023-05-03 MED ORDER — DILTIAZEM HCL ER COATED BEADS 120 MG PO CP24
120.0000 mg | ORAL_CAPSULE | Freq: Every day | ORAL | Status: DC
  Administered 2023-05-03 – 2023-05-04 (×2): 120 mg via ORAL
  Filled 2023-05-03 (×3): qty 1

## 2023-05-03 MED ORDER — GUAIFENESIN-DM 100-10 MG/5ML PO SYRP
5.0000 mL | ORAL_SOLUTION | ORAL | Status: DC | PRN
  Administered 2023-05-03 – 2023-05-08 (×3): 5 mL via ORAL
  Filled 2023-05-03 (×3): qty 5

## 2023-05-03 NOTE — Progress Notes (Signed)
 PHARMACY - ANTICOAGULATION CONSULT NOTE  Pharmacy Consult for Heparin infusion Indication: atrial fibrillation  Allergies  Allergen Reactions   Proair Hfa [Albuterol] Palpitations    Only use in emergency due to has heart problems    Patient Measurements: Height: 5' 9.02" (175.3 cm) Weight: 84.2 kg (185 lb 10 oz) IBW/kg (Calculated) : 70.74 Heparin Dosing Weight: 82.8 kg  Vital Signs: Temp: 99.7 F (37.6 C) (03/05 0500) Temp Source: Bladder (03/05 0400) BP: 127/71 (03/05 0500) Pulse Rate: 86 (03/05 0500)  Labs: Recent Labs    05/01/23 0248 05/01/23 0314 05/01/23 0445 05/01/23 2102 05/02/23 0840 05/03/23 0336  HGB 13.3 11.9*  --   --  12.0* 10.9*  HCT 38.5* 35.0*  --   --  35.3* 30.8*  PLT 100*  --   --   --  83* 97*  APTT 28  --   --   --   --   --   LABPROT 14.6  --   --   --   --   --   INR 1.1  --   --   --   --   --   HEPARINUNFRC  --   --   --  0.36 0.41 0.41  CREATININE 1.24  --   --   --  1.37* 1.49*  TROPONINIHS 23*  --  31*  --   --   --     Estimated Creatinine Clearance: 36.9 mL/min (A) (by C-G formula based on SCr of 1.49 mg/dL (H)).   Medical History: Past Medical History:  Diagnosis Date   Allergy    Arrhythmia    Atrial fibrillation (HCC)    Barrett esophagus    Barrett's esophagus    Blood transfusion without reported diagnosis    BPH (benign prostatic hyperplasia)    Cervical stenosis of spine    Chronic obstructive asthma    Dementia (HCC)    Difficult intubation 13 or 14 years ago, at [facility] long   airway was up near nostrils with intubation per patient with bowel surgery   ED (erectile dysfunction)    GERD (gastroesophageal reflux disease)    Hammer toe of right foot    Hiatal hernia    Hyperlipidemia    Hypertension    OSA (obstructive sleep apnea) 08/05/10 PSG   AHI 24   Paroxysmal atrial fibrillation (HCC)    Hospitalized on 05/27/10   Renal cell carcinoma 2009   right renal mass, followed by Dr. Teodora Medici   Sleep apnea     Assessment: 24 YOM presented to the ED with AMS, fever, and hypoxia. PTA Pradaxa 150 mg PO BID (LD unknown). Pharmacy consulted to dose heparin for atrial fibrillation.   HL this AM remains therapeutic at 0.41. Plts 97 (up), Hgb 10.9 (down). Per nurse hematuria present on exam likely due to trauma from the foley. Patient has clinically improved and planning to transition off the unit. Scr elevated at 1.49 (BL 1.24). PTA Pradaxa does not qualify for renal dose adjustment at this time.  Goal of Therapy:  Heparin level 0.3-0.7 units/ml Monitor platelets by anticoagulation protocol: Yes   Plan:  Heparin infusion discontinued and anticoag will be held overnight   HL will be discontinued Initiate Pradaxa 150 mg oral twice daily starting tomorrow morning (3/06) Continue to monitor H&H and platelets, S/Sx of bleeding   Laqueta Jean PharmD Candidate 05/03/2023 10:51 AM

## 2023-05-03 NOTE — Evaluation (Signed)
 Clinical/Bedside Swallow Evaluation Patient Details  Name: [name]. MRN: [id number] Date of Birth: 06-Dec-1940  Today's Date: 05/03/2023 Time: SLP Start Time (ACUTE ONLY): 1018 SLP Stop Time (ACUTE ONLY): 1028 SLP Time Calculation (min) (ACUTE ONLY): 10 min  Past Medical History:  Past Medical History:  Diagnosis Date   Allergy    Arrhythmia    Atrial fibrillation (HCC)    Barrett esophagus    Barrett's esophagus    Blood transfusion without reported diagnosis    BPH (benign prostatic hyperplasia)    Cervical stenosis of spine    Chronic obstructive asthma    Dementia (HCC)    Difficult intubation 13 or 14 years ago, at [facility] long   airway was up near nostrils with intubation per patient with bowel surgery   ED (erectile dysfunction)    GERD (gastroesophageal reflux disease)    Hammer toe of right foot    Hiatal hernia    Hyperlipidemia    Hypertension    OSA (obstructive sleep apnea) 08/05/10 PSG   AHI 24   Paroxysmal atrial fibrillation (HCC)    Hospitalized on 05/27/10   Renal cell carcinoma 2009   right renal mass, followed by Dr. Teodora Medici   Sleep apnea    Past Surgical History:  Past Surgical History:  Procedure Laterality Date   BASAL CELL CARCINOMA EXCISION     BIOPSY  12/18/2017   Procedure: BIOPSY;  Surgeon: Meryl Dare, MD;  Location: [facility];  Service: Endoscopy;;   ESOPHAGOGASTRODUODENOSCOPY (EGD) WITH PROPOFOL N/A 08/26/2014   Procedure: ESOPHAGOGASTRODUODENOSCOPY (EGD) WITH PROPOFOL;  Surgeon: Meryl Dare, MD;  Location: [facility];  Service: Endoscopy;  Laterality: N/A;   ESOPHAGOGASTRODUODENOSCOPY (EGD) WITH PROPOFOL N/A 12/18/2017   Procedure: ESOPHAGOGASTRODUODENOSCOPY (EGD) WITH PROPOFOL;  Surgeon: Meryl Dare, MD;  Location: [facility];  Service: Endoscopy;  Laterality: N/A;   EXPLORATORY LAPAROTOMY W/ BOWEL RESECTION  12/1999   segmental sbr of a hemangioma, repair of incarcerated umbilical hernia   exploratory  laparotomy, segmental small bowel resection of a hemangioma, repair of incarcerated umbelical hernia  12/1999   EYE SURGERY     cataracts done   renal cancer surgery with RF Ablation     TONSILLECTOMY     HPI:  Patient is an 83 y.o. male with PMH: multiple myeloma, a-fib, HTN, Barrett's esophagus, hiatal hernia, GERD, dementia. He presented to the hospital on 05/01/2023 with AMS and associated fever and hypoxia. He was found to have gurgling respirations and obtunded and was intubated in ED. He was febrile at 104.5. Admitted for acute encephalopathy, septic shock secondary to influenza A, acute respiratory failure with hypoxia, aspiration PNA. He was extubated on 3/4 at 1100 to 4L oxygen via nasal cannula. He passed [facility] swallow screen however there was concern for swallow and SLP swallow evaluation ordered.    Assessment / Plan / Recommendation  Clinical Impression  Patient presents with questionable pharyngeal phase dysphagia and/or esophageal dysphagia as per this BSE. His significant other reported that he did exhibit coughing with liquids prior to this hospitalization. Per chart review and confirmed by significant other, patient with h/o GERD but she is not sure if the medications he takes for it are helping. Patient with somewhat congested sounding voice and cough. Swallow initiation appeared [facility] with sips of water, and although no immediate, overt s/s aspiration, patient did exhibit delayed coughing. Evaluation shortened as patient requesting to use bedside commode. SLP plans to return to evaluate further. SLP Visit Diagnosis: Dysphagia,  unspecified (R13.10)    Aspiration Risk  Mild aspiration risk    Diet Recommendation Regular;Thin liquid    Liquid Administration via: Cup;Straw Medication Administration: Whole meds with liquid Supervision: Patient able to self feed Compensations: Slow rate;Small sips/bites Postural Changes: Seated upright at 90 degrees;Remain upright for at least 30  minutes after po intake    Other  Recommendations Oral Care Recommendations: Oral care BID    Recommendations for follow up therapy are one component of a multi-disciplinary discharge planning process, led by the attending physician.  Recommendations may be updated based on patient status, additional functional criteria and insurance authorization.  Follow up Recommendations Other (comment) (TBD)      Assistance Recommended at Discharge    Functional Status Assessment Patient has had a recent decline in their functional status and demonstrates the ability to make significant improvements in function in a reasonable and predictable amount of time.  Frequency and Duration min 2x/week  1 week       Prognosis Prognosis for improved oropharyngeal function: Good      Swallow Study   General Date of Onset: 05/01/23 HPI: Patient is an 83 y.o. male with PMH: multiple myeloma, a-fib, HTN, Barrett's esophagus, hiatal hernia, GERD, dementia. He presented to the hospital on 05/01/2023 with AMS and associated fever and hypoxia. He was found to have gurgling respirations and obtunded and was intubated in ED. He was febrile at 104.5. Admitted for acute encephalopathy, septic shock secondary to influenza A, acute respiratory failure with hypoxia, aspiration PNA. He was extubated on 3/4 at 1100 to 4L oxygen via nasal cannula. He passed [facility] swallow screen however there was concern for swallow and SLP swallow evaluation ordered. Type of Study: Bedside Swallow Evaluation Previous Swallow Assessment: none found Diet Prior to this Study: Regular;Thin liquids (Level 0) Temperature Spikes Noted: Yes (99.7) Respiratory Status: Nasal cannula History of Recent Intubation: Yes Total duration of intubation (days): 2 days Date extubated: 05/02/23 Behavior/Cognition: Alert;Cooperative;Pleasant mood Oral Cavity Assessment: Within Functional Limits Oral Care Completed by SLP: No Oral Cavity - Dentition: Adequate  natural dentition Vision: Functional for self-feeding Self-Feeding Abilities: Able to feed self;Needs set up Patient Positioning: Upright in bed Baseline Vocal Quality: Other (comment) (somewhat congested) Volitional Cough: Congested;Strong Volitional Swallow: Able to elicit    Oral/Motor/Sensory Function Overall Oral Motor/Sensory Function: Within functional limits   Ice Chips     Thin Liquid Thin Liquid: Impaired Presentation: Straw;Self Fed Pharyngeal  Phase Impairments: Cough - Delayed    Nectar Thick     Honey Thick     Puree Puree: Not tested   Solid     Solid: Not tested      Angela Nevin, MA, CCC-SLP Speech Therapy

## 2023-05-03 NOTE — Evaluation (Signed)
 Physical Therapy Evaluation Patient Details Name: [name]. MRN: [id number] DOB: 07-Jun-1940 Today's Date: 05/03/2023  History of Present Illness  Pt is an 83 y/o M presenting to ED on 3/3 with AMS, associated fever and hypoxia, intubated in ED and extubated 3/4. Found to have Flu A, sepsis, Admitted for acute respiratory failure with hypoxia, PMH includes multiple myeloma, A fib, HTN   Clinical Impression  Pt admitted with above diagnosis. PTA pt lived in one level duplex with his girlfriend (of 40 years) in the unit next door. Mod I in the home with/without RW. Pt has recently required transport chair in the community. Pt currently with functional limitations due to the deficits listed below (see PT Problem List). On eval, he required mod assist bed mobility, and mod assist transfers with RW. Fair sitting balance and poor standing balance. He fatigues quickly. SpO2 stable on 2L. Pt in a fib. Resting HR 100 and up to 170 with static stand. Pt will benefit from acute skilled PT to increase their independence and safety with mobility to allow discharge. Upon d/c, pt would benefit from further therapy in inpatient setting < 3 hours/day.         If plan is discharge home, recommend the following: A lot of help with walking and/or transfers;A lot of help with bathing/dressing/bathroom;Assistance with cooking/housework;Assist for transportation;Supervision due to cognitive status   Can travel by private vehicle   Yes    Equipment Recommendations None recommended by PT  Recommendations for Other Services       Functional Status Assessment Patient has had a recent decline in their functional status and demonstrates the ability to make significant improvements in function in a reasonable and predictable amount of time.     Precautions / Restrictions Precautions Precautions: Fall;Other (comment) Recall of Precautions/Restrictions: Impaired Precaution/Restrictions Comments: watch HR and  SpO2      Mobility  Bed Mobility Overal bed mobility: Needs Assistance Bed Mobility: Supine to Sit     Supine to sit: Mod assist, Used rails, HOB elevated          Transfers Overall transfer level: Needs assistance Equipment used: Rolling walker (2 wheels) Transfers: Sit to/from Stand Sit to Stand: Mod assist           General transfer comment: cues for sequencing, assist to power up and place hands on RW. HR up to 170 with static stand requiring return to recliner.    Ambulation/Gait               General Gait Details: deferred due to tachy  Stairs            Wheelchair Mobility     Tilt Bed    Modified Rankin (Stroke Patients Only)       Balance Overall balance assessment: Needs assistance Sitting-balance support: Feet supported Sitting balance-Leahy Scale: Fair     Standing balance support: During functional activity, Reliant on assistive device for balance, Bilateral upper extremity supported Standing balance-Leahy Scale: Poor                               Pertinent Vitals/Pain Pain Assessment Pain Assessment: Faces Faces Pain Scale: No hurt    Home Living Family/patient expects to be discharged to:: Private residence Living Arrangements: Spouse/significant other Available Help at Discharge: Family;Available 24 hours/day Type of Home: Apartment (duplex) Home Access: Level entry       Home Layout: One level  Home Equipment: Rollator (4 wheels);Rolling Walker (2 wheels);Shower seat;Transport chair;Grab bars - tub/shower      Prior Function Prior Level of Function : Needs assist             Mobility Comments: Per girlfriend, pt has recently been using RW in home and transport chair in community ADLs Comments: assist with IADLs     Extremity/Trunk Assessment   Upper Extremity Assessment Upper Extremity Assessment: Generalized weakness    Lower Extremity Assessment Lower Extremity Assessment: Generalized  weakness    Cervical / Trunk Assessment Cervical / Trunk Assessment: Normal  Communication   Communication Communication: Impaired Factors Affecting Communication: Difficulty expressing self    Cognition Arousal: Alert Behavior During Therapy: WFL for tasks assessed/performed   PT - Cognitive impairments: Orientation, Awareness, Memory, Sequencing, Problem solving, Safety/Judgement   Orientation impairments: Place, Time, Situation                   PT - Cognition Comments: dementia at baseline Following commands: Impaired Following commands impaired: Follows one step commands with increased time, Only follows one step commands consistently     Cueing Cueing Techniques: Verbal cues, Gestural cues, Tactile cues     General Comments General comments (skin integrity, edema, etc.): SpO2 stable on 2L. Pt in a fib. Resting HR 100. Up to 170 with static stand    Exercises     Assessment/Plan    PT Assessment Patient needs continued PT services  PT Problem List Decreased strength;Decreased balance;Decreased cognition;Decreased mobility;Cardiopulmonary status limiting activity;Decreased activity tolerance;Decreased safety awareness       PT Treatment Interventions DME instruction;Functional mobility training;Balance training;Patient/family education;Gait training;Therapeutic activities;Therapeutic exercise;Cognitive remediation    PT Goals (Current goals can be found in the Care Plan section)  Acute Rehab PT Goals Patient Stated Goal: not stated PT Goal Formulation: Patient unable to participate in goal setting Time For Goal Achievement: 05/17/23 Potential to Achieve Goals: Good    Frequency Min 2X/week     Co-evaluation               AM-PAC PT "6 Clicks" Mobility  Outcome Measure Help needed turning from your back to your side while in a flat bed without using bedrails?: A Lot Help needed moving from lying on your back to sitting on the side of a flat bed  without using bedrails?: A Lot Help needed moving to and from a bed to a chair (including a wheelchair)?: A Lot Help needed standing up from a chair using your arms (e.g., wheelchair or bedside chair)?: A Lot Help needed to walk in hospital room?: Total Help needed climbing 3-5 steps with a railing? : Total 6 Click Score: 10    End of Session Equipment Utilized During Treatment: Gait belt;Oxygen Activity Tolerance: Treatment limited secondary to medical complications (Comment) (tachy) Patient left: in chair;with call bell/phone within reach;with chair alarm set;with family/visitor present Nurse Communication: Other (comment);Mobility status (HR, L wrist IV bleeding) PT Visit Diagnosis: Other abnormalities of gait and mobility (R26.89);Muscle weakness (generalized) (M62.81)    Time: 1610-9604 PT Time Calculation (min) (ACUTE ONLY): 14 min   Charges:   PT Evaluation $PT Eval Moderate Complexity: 1 Mod   PT General Charges $$ ACUTE PT VISIT: 1 Visit         Ferd Glassing., PT  Office # [phone]   Ilda Foil 05/03/2023, 12:27 PM

## 2023-05-03 NOTE — Progress Notes (Signed)
 Nutrition Follow-up  DOCUMENTATION CODES:   Not applicable  INTERVENTION:  Continue regular diet as ordered [employer] Essentials TID, each packet mixed with 8 ounces of 2% milk provides 13 grams of protein and 260 calories. Magic cup TID with meals, each supplement provides 290 kcal and 9 grams of protein "High Calorie, High Protein Nutrition Therapy" handout added to AVS for home reference until PO intake/appetite returns to baseline  NUTRITION DIAGNOSIS:   Inadequate oral intake related to inability to eat (pt sedated and ventilated) as evidenced by NPO status. - progressing, diet advanced  GOAL:   Patient will meet greater than or equal to 90% of their needs - addressing via meals and nutrition supplements  MONITOR:   PO intake, Supplement acceptance, Labs, Weight trends  REASON FOR ASSESSMENT:   Ventilator    ASSESSMENT:   83 y/o male with h/o exploratory laparotomy (segmental small bowel resection of a hemangioma 3.3cm & repair of incarcerated umbelical hernia 2001), Afib, GERD, Barrett's esophagus, OSA, asthma, BPH, AAA, anxiety, multiple myeloma on chemotherapy, dementia, HTN, HLD, HSV, L RCC and pancreatic cyst who is admitted with Afib with RVR, PNA, flu A and septic shock.  3/3 - admitted, intubated 3/4 - extubated; diet advanced to regular  Pt sitting up in bedside recliner at time of visit. His wife present at bedside. She reports that he ate a few bites of each food item from dinner last night. This morning he ate french toast and bacon. Pt's wife provides list of foods that pt likes and dislikes to place in menu ordering system and requested automatic trays as she is afraid to miss ordering a meal for him.   Pt's wife mentions that PTA he was eating adequately. He tried protein supplements but did not like them very much. He was able to tolerate Premier Protein shakes at times. Discussed different types of nutrition supplements, to which pt and his wife  are amenable to trying carnation instant breakfast and magic cups with meals.   Admit weight: 82.8 kg Current weight: 84.2 kg  Drains/lines: UOP: x24 hours  Medications: colace BID, SSI 0-9 units q4h, protonix, miralax daily, klor-con q4h, IV abx  Labs:  Sodium 133 Potassium 2.9 Cr 1.49 GFR 46 CBG's 86-118 x24 hours  Diet Order:   Diet Order             Diet regular Room service appropriate? Yes; Fluid consistency: Thin  Diet effective now                   EDUCATION NEEDS:  No education needs have been identified at this time  Skin:  Skin Assessment: Reviewed RN Assessment (ecchymosis)  Last BM:  3/4 type 6 large  Height:  Ht Readings from Last 1 Encounters:  05/01/23 5' 9.02" (1.753 m)    Weight:  Wt Readings from Last 1 Encounters:  05/02/23 84.2 kg    Ideal Body Weight:  72.7 kg  BMI:  Body mass index is 27.4 kg/m.  Estimated Nutritional Needs:   Kcal:  2000-2200  Protein:  110-125g  Fluid:  >/=2L  Drusilla Kanner, RDN, LDN Clinical Nutrition

## 2023-05-03 NOTE — NC FL2 (Signed)
 [place] MEDICAID FL2 LEVEL OF CARE FORM     IDENTIFICATION  Patient Name: [name]. Birthdate: 1940-09-18 Sex: male Admission Date (Current Location): 05/01/2023  [facility] and [place] Number:  Producer, television/film/video and Address:  [facility], [address] [place], [place] [address]      Provider Number: [id number]  Attending Physician Name and Address:  Steffanie Dunn, DO  Relative Name and Phone Number:  Romero Belling; Daughter/HCPOA; [phone]    Current Level of Care: Hospital Recommended Level of Care: Skilled Nursing Facility Prior Approval Number:    Date Approved/Denied:   PASRR Number: [id number]  Discharge Plan: SNF    Current Diagnoses: Patient Active Problem List   Diagnosis Date Noted   Acute respiratory failure with hypoxemia (HCC) 05/01/2023   On mechanically assisted ventilation (HCC) 05/01/2023   Influenza A 05/01/2023   Acute respiratory failure with hypoxia (HCC) 05/01/2023   Gait disorder 02/01/2023   Pressure injury of skin 02/18/2022   Dementia without behavioral disturbance (HCC) 02/18/2022   Neutropenic fever (HCC) 02/17/2022   Asthma 03/09/2020   Multiple myeloma (HCC) 12/26/2019   Coagulation disorder (HCC) 05/29/2019   Foot callus 03/20/2019   Tailor's bunion of right foot 01/01/2019   Capsulitis 01/01/2019   Sensorineural hearing loss (SNHL), 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   Pain due to onychomycosis of toenails of both feet 08/15/2018   Dermatitis associated with moisture from stool incontinence 08/14/2018   Gastric polyp    Incontinence of feces 08/19/2017   AAA (abdominal aortic aneurysm) (HCC) 06/30/2017   Anxiety 06/30/2017   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   Anosmia 02/25/2016   Chronic pansinusitis 02/25/2016   Impacted cerumen of both ears 02/25/2016   Noise effect on both inner ears 02/25/2016   Presbycusis of both ears 02/25/2016   Duodenal nodule    Renal cell carcinoma of left kidney (HCC)  11/11/2013   BPH (benign prostatic hyperplasia) 11/07/2011   Hip pain 07/04/2011   Perianal itch 07/04/2011   ED (erectile dysfunction) of organic origin 03/07/2011   OSA on CPAP 08/08/2010   Chronic obstructive asthma (HCC) 07/14/2010   Rhinitis 07/14/2010   Cough 07/13/2010   Barrett's esophagus 03/11/2009   Atrial fibrillation (HCC) 07/29/2008   GERD 07/29/2008    Orientation RESPIRATION BLADDER Height & Weight     Place  O2 (3L nasal cannula) Incontinent, Indwelling catheter Weight: 185 lb 10 oz (84.2 kg) Height:  5' 9.02" (175.3 cm)  BEHAVIORAL SYMPTOMS/MOOD NEUROLOGICAL BOWEL NUTRITION STATUS      Incontinent Diet (Please see dc summary)  AMBULATORY STATUS COMMUNICATION OF NEEDS Skin   Extensive Assist Verbally Normal                       Personal Care Assistance Level of Assistance  Bathing, Feeding, Dressing Bathing Assistance: Maximum assistance Feeding assistance: Maximum assistance Dressing Assistance: Maximum assistance     Functional Limitations Info             SPECIAL CARE FACTORS FREQUENCY  PT (By licensed PT), OT (By licensed OT)     PT Frequency: 5x OT Frequency: 5x            Contractures Contractures Info: Not present    Additional Factors Info  Code Status, Allergies, Isolation Precautions Code Status Info: Full Code Allergies Info: Proair Hfa (albuterol)     Isolation Precautions Info: Droplet Precautions (Influenza A 05/01/2023)     Current Medications (05/03/2023):  This is the current hospital active medication list Current Facility-Administered Medications  Medication Dose Route Frequency Provider Last Rate Last Admin   acetaminophen (TYLENOL) tablet 650 mg  650 mg Per Tube Q4H PRN Lanier Clam, NP   650 mg at 05/03/23 0327   acyclovir (ZOVIRAX) 200 MG capsule 400 mg  400 mg Oral BID Knute Neu, RPH   400 mg at 05/03/23 1008   amoxicillin-clavulanate (AUGMENTIN) 875-125 MG per tablet 1 tablet  1 tablet Oral Q12H  Steffanie Dunn, DO   1 tablet at 05/03/23 1510   arformoterol (BROVANA) nebulizer solution 15 mcg  15 mcg Nebulization BID Lanier Clam, NP   15 mcg at 05/03/23 0917   budesonide (PULMICORT) nebulizer solution 0.5 mg  0.5 mg Nebulization BID Lanier Clam, NP   0.5 mg at 05/03/23 1610   calcium citrate (CALCITRATE - dosed in mg elemental calcium) tablet 1,900 mg  400 mg of elemental calcium Oral Daily Karie Fetch P, DO   1,900 mg at 05/03/23 1509   Chlorhexidine Gluconate Cloth 2 % PADS 6 each  6 each Topical Daily Briant Sites, DO   6 each at 05/02/23 0200   [START ON 05/04/2023] dabigatran (PRADAXA) capsule 150 mg  150 mg Oral Q12H Karie Fetch P, DO       diltiazem (CARDIZEM CD) 24 hr capsule 120 mg  120 mg Oral Daily Karie Fetch P, DO   120 mg at 05/03/23 1510   docusate sodium (COLACE) capsule 100 mg  100 mg Oral BID Knute Neu, RPH       donepezil (ARICEPT) tablet 10 mg  10 mg Oral QHS Karie Fetch P, DO       dutasteride (AVODART) capsule 0.5 mg  0.5 mg Oral Daily Steffanie Dunn, DO   0.5 mg at 05/03/23 1510   insulin aspart (novoLOG) injection 0-5 Units  0-5 Units Subcutaneous QHS Rutherford Nail Z, RPH       insulin aspart (novoLOG) injection 0-9 Units  0-9 Units Subcutaneous TID WC Knute Neu, RPH       memantine [facility]) tablet 10 mg  10 mg Oral Daily Knute Neu, RPH   10 mg at 05/03/23 1008   metoprolol succinate (TOPROL-XL) 24 hr tablet 25 mg  25 mg Oral Daily Karie Fetch P, DO   25 mg at 05/03/23 1510   montelukast (SINGULAIR) tablet 10 mg  10 mg Oral QHS Rutherford Nail Z, RPH       ondansetron [facility]) injection 4 mg  4 mg Intravenous Q6H PRN Lanier Clam, NP       Oral care mouth rinse  15 mL Mouth Rinse PRN Briant Sites, DO       oseltamivir (TAMIFLU) capsule 30 mg  30 mg Oral BID Knute Neu, RPH   30 mg at 05/03/23 1007   pantoprazole (PROTONIX) EC tablet 40 mg  40 mg Oral QHS Rutherford Nail Z, RPH       phenol (CHLORASEPTIC) mouth spray 1 spray  1 spray  Mouth/Throat PRN Karie Fetch P, DO       polyethylene glycol (MIRALAX / GLYCOLAX) packet 17 g  17 g Oral Daily Knute Neu, RPH       pravastatin (PRAVACHOL) tablet 20 mg  20 mg Oral q1800 Knute Neu, RPH       tadalafil (CIALIS) tablet 5 mg  5 mg Oral Daily Karie Fetch P, DO   5 mg at 05/03/23  1510     Discharge Medications: Please see discharge summary for a list of discharge medications.  Relevant Imaging Results:  Relevant Lab Results:   Additional Information SS# [id number]  Marliss Coots, LCSW

## 2023-05-03 NOTE — TOC Progression Note (Addendum)
 Transition of Care (TOC) - Progression Note    Patient Details  Name: [name]. MRN: [id number] Date of Birth: [birth year]  Transition of Care [facility]) CM/SW Contact  Marliss Coots, LCSW Phone Number: 05/03/2023, 11:49 AM  Clinical Narrative:     11:49 AM Per progressions, patient was extubated yesterday and is disoriented x3.  4:19 PM CSW called patient's daughter/HCPOA, Summer, and informed her of physical therapy recommendation of patient discharge to SNF. Summer was agreeable with recommendation and consented CSW to send referrals to SNFs within [facility]. CSW discussed discharge transportation with Summer, who expressed interest in patient transporting via private vehicle instead of ambulance transportation.    Barriers to Discharge: Continued Medical Work up  Expected Discharge Plan and Services       Living arrangements for the past 2 months: Single Family Home                                       Social Determinants of Health (SDOH) Interventions SDOH Screenings   Food Insecurity: No Food Insecurity (05/01/2023)  Housing: Low Risk  (05/01/2023)  Transportation Needs: No Transportation Needs (05/01/2023)  Utilities: Not At Risk (05/01/2023)  Social Connections: Patient Unable To Answer (05/01/2023)  Tobacco Use: Medium Risk (05/01/2023)    Readmission Risk Interventions     No data to display

## 2023-05-03 NOTE — Discharge Instructions (Signed)

## 2023-05-03 NOTE — Progress Notes (Signed)
 [facility] ADULT ICU REPLACEMENT PROTOCOL   The patient does apply for the [facility] Adult ICU Electrolyte Replacment Protocol based on the criteria listed below:   1.Exclusion criteria: TCTS, ECMO, Dialysis, and Myasthenia Gravis patients 2. Is GFR >/= 30 ml/min? Yes.    Patient's GFR today is 46 3. Is SCr </= 2? Yes.   Patient's SCr is 1.49 mg/dL 4. Did SCr increase >/= 0.5 in 24 hours? No. 5.Pt's weight >40kg  Yes.   6. Abnormal electrolyte(s): Potassium  7. Electrolytes replaced per protocol 8.  Call MD STAT for K+ </= 2.5, Phos </= 1, or Mag </= 1 Physician:  Dr. Claudette Stapler [name] 05/03/2023 5:49 AM

## 2023-05-03 NOTE — Evaluation (Signed)
 Occupational Therapy Evaluation Patient Details Name: [name]. MRN: [id number] DOB: 08-Sep-1940 Today's Date: 05/03/2023   History of Present Illness   Pt is an 83 y/o M presenting to ED on 3/3 with AMS, associated fever and hypoxia, intubated in ED and extubated 3/4. Found to have Flu A, sepsis, Admitted for acute respiratory failure with hypoxia, PMH includes multiple myeloma, A fib, HTN     Clinical Impressions Pt questionable historian, reports no AD use at baseline and has assist with IADLs, lives with girlfriend. Pt needing mod-max A for ADLs, mod A for bed mobility and mod A for pivot transfers with RW. Pt oriented to self only, but following commands with incr time. VSS on RA, SpO2 in low-mid 90s throughout session. Pt presenting with impairments listed below, will follow acutely. Patient will benefit from continued inpatient follow up therapy, <3 hours/day to maximize safety/ind with ADL/functional mobility.      If plan is discharge home, recommend the following:   A lot of help with walking and/or transfers;A lot of help with bathing/dressing/bathroom;Assistance with cooking/housework;Direct supervision/assist for medications management;Direct supervision/assist for financial management;Assist for transportation;Help with stairs or ramp for entrance     Functional Status Assessment   Patient has had a recent decline in their functional status and demonstrates the ability to make significant improvements in function in a reasonable and predictable amount of time.     Equipment Recommendations   Other (comment) (defer)     Recommendations for Other Services   PT consult     Precautions/Restrictions   Precautions Precautions: Fall Restrictions Weight Bearing Restrictions Per Provider Order: No     Mobility Bed Mobility Overal bed mobility: Needs Assistance Bed Mobility: Supine to Sit     Supine to sit: Mod assist          Transfers Overall  transfer level: Needs assistance Equipment used: Rolling walker (2 wheels) Transfers: Sit to/from Stand, Bed to chair/wheelchair/BSC Sit to Stand: Mod assist     Step pivot transfers: Mod assist            Balance Overall balance assessment: Needs assistance Sitting-balance support: Feet supported Sitting balance-Leahy Scale: Fair     Standing balance support: During functional activity, Reliant on assistive device for balance Standing balance-Leahy Scale: Poor                             ADL either performed or assessed with clinical judgement   ADL Overall ADL's : Needs assistance/impaired Eating/Feeding: Minimal assistance   Grooming: Minimal assistance   Upper Body Bathing: Moderate assistance   Lower Body Bathing: Maximal assistance   Upper Body Dressing : Moderate assistance   Lower Body Dressing: Maximal assistance   Toilet Transfer: Moderate assistance;Rolling walker (2 wheels);BSC/3in1   Toileting- Clothing Manipulation and Hygiene: Maximal assistance       Functional mobility during ADLs: Moderate assistance;Rolling walker (2 wheels)       Vision   Vision Assessment?: No apparent visual deficits     Perception Perception: Not tested       Praxis Praxis: Not tested       Pertinent Vitals/Pain Pain Assessment Pain Assessment: No/denies pain     Extremity/Trunk Assessment Upper Extremity Assessment Upper Extremity Assessment: Generalized weakness   Lower Extremity Assessment Lower Extremity Assessment: Defer to PT evaluation   Cervical / Trunk Assessment Cervical / Trunk Assessment: Normal   Communication Communication Communication: No apparent difficulties  Cognition Arousal: Alert Behavior During Therapy: WFL for tasks assessed/performed Cognition: Cognition impaired   Orientation impairments: Place, Time, Situation Awareness: Online awareness impaired Memory impairment (select all impairments): Short-term  memory, Working [employer] fast streamer, Conservation officer, historic buildings     OT - Cognition Comments: oriented to self only, con tinues to ask date despite being told multiple times during session, initially states he lives alone but then reports he lives with his girlfriend                 Following commands: Intact       Cueing  General Comments   Cueing Techniques: Verbal cues;Gestural cues  VSS on RA , spO2 low-mid 90s   Exercises     Shoulder Instructions      Home Living Family/patient expects to be discharged to:: Private residence Living Arrangements: Spouse/significant other Available Help at Discharge: Family;Available 24 hours/day Type of Home: Apartment Home Access: Level entry     Home Layout: One level     Bathroom Shower/Tub: Walk-in shower         Home Equipment: Rollator (4 wheels);Rolling Walker (2 wheels);Shower seat;Transport chair;Grab bars - tub/shower   Additional Comments: per pt he has no DME, however per chart review, pt has the above      Prior Functioning/Environment               Mobility Comments: no AD use per pt ADLs Comments: assist with IADLs    OT Problem List: Decreased strength;Decreased range of motion;Decreased activity tolerance;Impaired balance (sitting and/or standing);Decreased cognition;Decreased safety awareness;Cardiopulmonary status limiting activity   OT Treatment/Interventions: Self-care/ADL training;Therapeutic exercise;Energy conservation;DME and/or AE instruction;Therapeutic activities;Patient/family education;Balance training;Cognitive remediation/compensation      OT Goals(Current goals can be found in the care plan section)   Acute Rehab OT Goals Patient Stated Goal: none stated OT Goal Formulation: With patient Time For Goal Achievement: 05/17/23 Potential to Achieve Goals: Good ADL Goals Pt Will Perform Upper Body Dressing: with supervision;sitting Pt Will Perform Lower Body Dressing: with min assist;sit  to/from stand;sitting/lateral leans Pt Will Transfer to Toilet: with min assist;ambulating;regular height toilet Additional ADL Goal #1: pt will tolerate OOB activity x10 min with VSS in prep for ADLs   OT Frequency:  Min 1X/week    Co-evaluation              AM-PAC OT "6 Clicks" Daily Activity     Outcome Measure Help from another person eating meals?: A Little Help from another person taking care of personal grooming?: A Little Help from another person toileting, which includes using toliet, bedpan, or urinal?: A Lot Help from another person bathing (including washing, rinsing, drying)?: A Lot Help from another person to put on and taking off regular upper body clothing?: A Little Help from another person to put on and taking off regular lower body clothing?: A Lot 6 Click Score: 15   End of Session Equipment Utilized During Treatment: Gait belt;Rolling walker (2 wheels) Nurse Communication: Mobility status  Activity Tolerance: Patient tolerated treatment well Patient left: in chair;with call bell/phone within reach;with chair alarm set  OT Visit Diagnosis: Unsteadiness on feet (R26.81);Other abnormalities of gait and mobility (R26.89);Muscle weakness (generalized) (M62.81);Other symptoms and signs involving cognitive function                Time: 0752-0820 OT Time Calculation (min): 28 min Charges:  OT General Charges $OT Visit: 1 Visit OT Evaluation $OT Eval Moderate Complexity: 1 Mod OT Treatments $Self Care/Home Management :  8-22 mins  Carver Fila, OTD, OTR/L SecureChat Preferred Acute Rehab [phone]   Dalphine Handing 05/03/2023, 11:54 AM

## 2023-05-03 NOTE — Progress Notes (Addendum)
 NAME:  [name]., MRN:  [id number], DOB:  06-01-1940, LOS: 2 ADMISSION DATE:  05/01/2023, CONSULTATION DATE:  05/01/23 REFERRING MD:  Rancour, CHIEF COMPLAINT:  AMS   History of Present Illness:  83 yo M PMH of multiple myeloma (on daratumumuab and dexamethasone) , Afib (pradaxa, metop & dilt), HTN, OSA on CPAP presented to [facility] ED 3/3 w AMS, associated fever & hypoxia. Was found w gurgling respirations. Intubated in ED as he was obtunded -- this was /w family before hand. Flu A positive. Febrile, c/f sepsis started on empiric abx. PCCM consulted for admission.     Pertinent  Medical History  Multiple myeloma Afib HLD HTN OSA AAA BPH RCC L kidney  Significant Hospital Events: Including procedures, antibiotic start and stop dates in addition to other pertinent events   3/3 ED w AMS fever hypoxia. Intubated. Admit to ICU w Flu A  3/3 off levophed 3/4 extubated  Interim History / Subjective:  Fever overnight. Awake and oriented x 2. Denies any acute concerns.   Objective   Blood pressure 127/71, pulse 86, temperature 99.7 F (37.6 C), resp. rate (!) 23, height 5' 9.02" (1.753 m), weight 84.2 kg, SpO2 97%.    Vent Mode: PSV;CPAP FiO2 (%):  [40 %] 40 % PEEP:  [5 cmH20] 5 cmH20 Pressure Support:  [8 cmH20-15 cmH20] 8 cmH20   Intake/Output Summary (Last 24 hours) at 05/03/2023 4098 Last data filed at 05/03/2023 0400 Gross per 24 hour  Intake 910.64 ml  Output 2600 ml  Net -1689.36 ml   Filed Weights   05/01/23 0247 05/01/23 0515 05/02/23 0200  Weight: 82.8 kg 82.8 kg 84.2 kg    Examination: General: alert, sitting up in bed, in no acute distress HENT: [place]/AT Lungs: normal effort on 2L [place], coarse  Cardiovascular: irregular rhythm, normal rate Abdomen: bowel sounds present, soft, mildly distended, non-tender Extremities: no LE edema Neuro: alert and oriented x 2  GU: foley  Resolved Hospital Problem list   Lactic acidosis Acute encephalopathy  Assessment & Plan:    Septic shock 2/2 Influenza A infection and c/f Aspiration PNA Acute hypoxic respiratory failure, improved Off vasopressors 3/3, extubated 3/4 to . Negative MRSA swab, d/c vanc.  -continue Tamiflu x 5 days  -continue Zosyn and azithromycin, narrow with plan to transition to po  -f/u respiratory asp cx: no organisms seen and Bcx: so far no growth -wean supplemental O2 as tolerated  -PT/OT eval  Afib with RVR -on Amio drip, plan to stop and restart home rate control   -stop heparin gtt, plan transition back to home [facility] -plan to restart home meds of Pradaxa, metop and dilt  Hypokalemia Elevated creatinine K 2.9. Mag normal. Scr up to 1.49 from 1.37 (BL 1.1-1.2). S/p IV lasix yesterday.  -replete K today -trend BMP  -monitor UOP, strict I&Os  Multiple myeloma (looks like a q28d cycle of daratumumab + dexamethasone, follows at [facility]) -outpatient oncology  -continue BID acyclovir for HSV/VZV suppression  Hx HTN  -holding home antihypertensives, restart as appropriate   BPH  -removed foley -monitor UOP  -restart home dutasteride   Dementia, with gait instability  -home namenda, stopped home aricept given bradycardia  OSA on CPAP Mild asthma  -continue brovana neb, budesonide neb, singulair at bedtime   Anemia Thrombocytopenia -chronic and close to baseline -trend CBC  Hematuria RN reports blood tinged urine today from foley.  -d/c foley catheter -monitor UOP  -bladder scan Q4H PRN  Best Practice (right  click and "Reselect all SmartList Selections" daily)   Diet/type: Regular consistency (see orders) DVT prophylaxis systemic heparin Pressure ulcer(s): present on admission  GI prophylaxis: PPI Lines: N/A Foley:  removal ordered  Code Status:  full code Last date of multidisciplinary goals of care discussion [updated significant other and children at bedside 3/5 ]  Labs   CBC: Recent Labs  Lab 05/01/23 0248 05/01/23 0314 05/02/23 0840  05/03/23 0336  WBC 6.1  --  5.1 4.7  NEUTROABS 5.7  --   --   --   HGB 13.3 11.9* 12.0* 10.9*  HCT 38.5* 35.0* 35.3* 30.8*  MCV 93.9  --  95.7 91.9  PLT 100*  --  83* 97*    Basic Metabolic Panel: Recent Labs  Lab 05/01/23 0248 05/01/23 0314 05/01/23 0445 05/02/23 0840 05/03/23 0336  NA 136 134*  --  136 133*  K 3.9 3.6  --  4.0 2.9*  CL 99  --   --  102 99  CO2 26  --   --  21* 23  GLUCOSE 175*  --   --  102* 96  BUN 14  --   --  14 16  CREATININE 1.24  --   --  1.37* 1.49*  CALCIUM 9.0  --   --  8.1* 7.4*  MG  --   --  1.6*  --  2.1   GFR: Estimated Creatinine Clearance: 36.9 mL/min (A) (by C-G formula based on SCr of 1.49 mg/dL (H)). Recent Labs  Lab 05/01/23 0248 05/01/23 0259 05/02/23 0840 05/02/23 1056 05/02/23 1356 05/02/23 1744 05/03/23 0336  WBC 6.1  --  5.1  --   --   --  4.7  LATICACIDVEN  --    < > 3.1* 3.2* 2.1* 1.9  --    < > = values in this interval not displayed.    Liver Function Tests: Recent Labs  Lab 05/01/23 0248  AST 26  ALT 19  ALKPHOS 65  BILITOT 1.1  PROT 6.3*  ALBUMIN 3.5   No results for input(s): "LIPASE", "AMYLASE" in the last 168 hours. No results for input(s): "AMMONIA" in the last 168 hours.  ABG    Component Value Date/Time   PHART 7.441 05/01/2023 0314   PCO2ART 34.2 05/01/2023 0314   PO2ART 72 (L) 05/01/2023 0314   HCO3 22.6 05/01/2023 0314   TCO2 24 05/01/2023 0314   O2SAT 92 05/01/2023 0314     Coagulation Profile: Recent Labs  Lab 05/01/23 0248  INR 1.1    Cardiac Enzymes: No results for input(s): "CKTOTAL", "CKMB", "CKMBINDEX", "TROPONINI" in the last 168 hours.  HbA1C: Hgb A1c MFr Bld  Date/Time Value Ref Range Status  05/01/2023 04:45 AM 5.3 4.8 - 5.6 % Final    Comment:    (NOTE) Pre diabetes:          5.7%-6.4%  Diabetes:              >6.4%  Glycemic control for   <7.0% adults with diabetes     CBG: Recent Labs  Lab 05/02/23 1116 05/02/23 1518 05/02/23 2042 05/02/23 2338  05/03/23 0335  GLUCAP 113* 118* 115* 113* 103*    Review of Systems:   sedated  Past Medical History:  He,  has a past medical history of Allergy, Arrhythmia, Atrial fibrillation (HCC), Barrett esophagus, Barrett's esophagus, Blood transfusion without reported diagnosis, BPH (benign prostatic hyperplasia), Cervical stenosis of spine, Chronic obstructive asthma, Dementia (HCC), Difficult intubation (13 or 14  years ago, at [facility] long), ED (erectile dysfunction), GERD (gastroesophageal reflux disease), Hammer toe of right foot, Hiatal hernia, Hyperlipidemia, Hypertension, OSA (obstructive sleep apnea) (08/05/10 PSG), Paroxysmal atrial fibrillation (HCC), Renal cell carcinoma (2009), and Sleep apnea.   Surgical History:   Past Surgical History:  Procedure Laterality Date   BASAL CELL CARCINOMA EXCISION     BIOPSY  12/18/2017   Procedure: BIOPSY;  Surgeon: Meryl Dare, MD;  Location: [facility] ENDOSCOPY;  Service: Endoscopy;;   ESOPHAGOGASTRODUODENOSCOPY (EGD) WITH PROPOFOL N/A 08/26/2014   Procedure: ESOPHAGOGASTRODUODENOSCOPY (EGD) WITH PROPOFOL;  Surgeon: Meryl Dare, MD;  Location: [facility] ENDOSCOPY;  Service: Endoscopy;  Laterality: N/A;   ESOPHAGOGASTRODUODENOSCOPY (EGD) WITH PROPOFOL N/A 12/18/2017   Procedure: ESOPHAGOGASTRODUODENOSCOPY (EGD) WITH PROPOFOL;  Surgeon: Meryl Dare, MD;  Location: [facility] ENDOSCOPY;  Service: Endoscopy;  Laterality: N/A;   EXPLORATORY LAPAROTOMY W/ BOWEL RESECTION  12/1999   segmental sbr of a hemangioma, repair of incarcerated umbilical hernia   exploratory laparotomy, segmental small bowel resection of a hemangioma, repair of incarcerated umbelical hernia  12/1999   EYE SURGERY     cataracts done   renal cancer surgery with RF Ablation     TONSILLECTOMY       Social History:   reports that he quit smoking about 43 years ago. His smoking use included cigars. He has been exposed to tobacco smoke. He has never used smokeless tobacco. He reports that he  does not drink alcohol and does not use drugs.   Family History:  His family history includes Asthma in his paternal grandmother; Diabetes in his brother, father, and mother; Heart attack in his paternal grandfather; Kidney cancer in his paternal aunt; Obesity in his brother; Stroke in his mother. There is no history of Colon cancer, Esophageal cancer, Rectal cancer, Stomach cancer, Dementia, or Alzheimer's disease.   Allergies Allergies  Allergen Reactions   Proair Hfa [Albuterol] Palpitations    Only use in emergency due to has heart problems     Home Medications  Prior to Admission medications   Medication Sig Start Date End Date Taking? Authorizing Provider  acyclovir (ZOVIRAX) 400 MG tablet Take 400 mg by mouth 2 (two) times daily. 01/05/20   [provider]  alfuzosin (UROXATRAL) 10 MG 24 hr tablet Take 1 tablet by mouth every evening. 08/11/17   [provider]  AMBULATORY NON FORMULARY MEDICATION CPAP at night    [provider]  benzonatate (TESSALON) 100 MG capsule Take by mouth every 8 (eight) hours as needed. 03/13/23   [provider]  calcium citrate-vitamin D (CITRACAL+D) 315-200 MG-UNIT tablet Take 2 tablets by mouth daily. 03/12/20   [provider]  CARTIA XT 120 MG 24 hr capsule Take 120 mg by mouth every morning. 12/31/22   [provider]  chlorthalidone (HYGROTON) 25 MG tablet Take 25 mg by mouth as needed (As needed per wife).    [provider]  DARATUMUMAB IV Inject 1 Dose into the skin every 30 (thirty) days.    [provider]  donepezil (ARICEPT) 10 MG tablet Take 10 mg by mouth daily.  06/27/19   [provider]  dutasteride (AVODART) 0.5 MG capsule Take 0.5 mg by mouth every evening. 02/26/15   [provider]  fluticasone-salmeterol (ADVAIR) 250-50 MCG/ACT AEPB Inhale 1 puff into the lungs 2 (two) times daily.    [provider]  Melatonin 1 MG CAPS Take by mouth.     [provider]  memantine (NAMENDA) 10  MG tablet Take 10 mg by mouth 2 (two) times daily. 12/06/19   [provider]  metoprolol succinate (TOPROL-XL) 25 MG 24 hr tablet Take 25 mg by mouth every morning.    [provider]  montelukast (SINGULAIR) 10 MG tablet TAKE 1 TABLET(10 MG) BY MOUTH AT BEDTIME Patient taking differently: Take 10 mg by mouth in the morning. 01/29/18   Coralyn Helling, MD  Multiple Vitamins-Minerals (MULTIVITAMIN WITH MINERALS) tablet Take 1 tablet by mouth daily.    [provider]  omeprazole (PRILOSEC) 20 MG capsule TAKE ONE CAPSULE BY MOUTH EVERY DAY 11/29/16   Meryl Dare, MD  potassium chloride (MICRO-K) 10 MEQ CR capsule Take 20 mEq by mouth daily. 01/27/22   [provider]  PRADAXA 150 MG CAPS capsule TAKE ONE CAPSULE BY MOUTH TWICE DAILY 01/02/23   [place], Peter M, MD  pravastatin (PRAVACHOL) 20 MG tablet Take 20 mg daily by mouth.    [provider]  Respiratory Therapy Supplies (ACE AEROSOL CLOUD ENHANCER) MISC Inhale into the lungs See admin instructions. 02/06/23   [provider]  tadalafil (CIALIS) 5 MG tablet Take 1 tablet by mouth daily. 09/16/20   [provider]  zoledronic acid (ZOMETA) 4 MG/5ML injection Inject 4 mg into the vein once. Yearly    [provider]     Critical care time:      Signature: Rana Snare, DO Internal Medicine Resident PGY-2

## 2023-05-03 NOTE — Progress Notes (Signed)
 Speech Language Pathology Treatment: Dysphagia  Patient Details Name: [name]. MRN: [id number] DOB: 1940-05-14 Today's Date: 05/03/2023 Time: 8295-6213 SLP Time Calculation (min) (ACUTE ONLY): 12 min  Assessment / Plan / Recommendation Clinical Impression  Patient seen by SLP for skilled treatment focused on dysphagia goals. SLP assessed patient's PO toleration with food from his lunch meal tray. He was sitting up in recliner and his significant other was feeding him. She denied having to feed him at home but did indicate that "he's shaky" when feeding self. Patient attempted to feed self but between being easily distracted and having tremors of hands when using utensil, he was not very successful. His significant other cut up his food and fed him. Patient exhibited prolonged mastication and trace to minimal residuals remaining after initial swallows, but no overt s/s aspiration during or after PO intake. SLP not recommending further skilled treatment; recommend continued management of his GERD.   HPI HPI: Patient is an [name] with PMH: multiple myeloma, a-fib, HTN, Barrett's esophagus, hiatal hernia, GERD, dementia. He presented to the hospital on 05/01/2023 with AMS and associated fever and hypoxia. He was found to have gurgling respirations and obtunded and was intubated in ED. He was febrile at 104.5. Admitted for acute encephalopathy, septic shock secondary to influenza A, acute respiratory failure with hypoxia, aspiration PNA. He was extubated on 3/4 at 1100 to 4L oxygen via nasal cannula. He passed [facility] swallow screen however there was concern for swallow and SLP swallow evaluation ordered.      SLP Plan  Discharge SLP treatment due to (comment);All goals met      Recommendations for follow up therapy are one component of a multi-disciplinary discharge planning process, led by the attending physician.  Recommendations may be updated based on patient status, additional functional  criteria and insurance authorization.    Recommendations  Diet recommendations: Regular;Thin liquid Liquids provided via: Straw;Cup Medication Administration: Whole meds with liquid Supervision: Full supervision/cueing for compensatory strategies;Staff to assist with self feeding Compensations: Slow rate;Small sips/bites;Minimize environmental distractions Postural Changes and/or Swallow Maneuvers: Seated upright 90 degrees;Upright 30-60 min after meal                  Oral care BID   Set up Supervision/Assistance Dysphagia, unspecified (R13.10)     Discharge SLP treatment due to (comment);All goals met    Angela Nevin, MA, CCC-SLP Speech Therapy

## 2023-05-04 DIAGNOSIS — J9601 Acute respiratory failure with hypoxia: Secondary | ICD-10-CM | POA: Diagnosis not present

## 2023-05-04 DIAGNOSIS — R6521 Severe sepsis with septic shock: Secondary | ICD-10-CM

## 2023-05-04 DIAGNOSIS — A419 Sepsis, unspecified organism: Secondary | ICD-10-CM | POA: Diagnosis not present

## 2023-05-04 DIAGNOSIS — J690 Pneumonitis due to inhalation of food and vomit: Secondary | ICD-10-CM | POA: Diagnosis not present

## 2023-05-04 LAB — CBC
HCT: 35.1 % — ABNORMAL LOW (ref 39.0–52.0)
Hemoglobin: 12.2 g/dL — ABNORMAL LOW (ref 13.0–17.0)
MCH: 31.6 pg (ref 26.0–34.0)
MCHC: 34.8 g/dL (ref 30.0–36.0)
MCV: 90.9 fL (ref 80.0–100.0)
Platelets: 119 10*3/uL — ABNORMAL LOW (ref 150–400)
RBC: 3.86 MIL/uL — ABNORMAL LOW (ref 4.22–5.81)
RDW: 16 % — ABNORMAL HIGH (ref 11.5–15.5)
WBC: 6.9 10*3/uL (ref 4.0–10.5)
nRBC: 0 % (ref 0.0–0.2)

## 2023-05-04 LAB — GLUCOSE, CAPILLARY
Glucose-Capillary: 100 mg/dL — ABNORMAL HIGH (ref 70–99)
Glucose-Capillary: 107 mg/dL — ABNORMAL HIGH (ref 70–99)
Glucose-Capillary: 120 mg/dL — ABNORMAL HIGH (ref 70–99)
Glucose-Capillary: 120 mg/dL — ABNORMAL HIGH (ref 70–99)
Glucose-Capillary: 124 mg/dL — ABNORMAL HIGH (ref 70–99)
Glucose-Capillary: 138 mg/dL — ABNORMAL HIGH (ref 70–99)

## 2023-05-04 LAB — MAGNESIUM: Magnesium: 2.3 mg/dL (ref 1.7–2.4)

## 2023-05-04 LAB — BASIC METABOLIC PANEL
Anion gap: 9 (ref 5–15)
BUN: 23 mg/dL (ref 8–23)
CO2: 21 mmol/L — ABNORMAL LOW (ref 22–32)
Calcium: 7.9 mg/dL — ABNORMAL LOW (ref 8.9–10.3)
Chloride: 106 mmol/L (ref 98–111)
Creatinine, Ser: 1.35 mg/dL — ABNORMAL HIGH (ref 0.61–1.24)
GFR, Estimated: 52 mL/min — ABNORMAL LOW (ref >60)
Glucose, Bld: 109 mg/dL — ABNORMAL HIGH (ref 70–99)
Potassium: 3.6 mmol/L (ref 3.5–5.1)
Sodium: 136 mmol/L (ref 135–145)

## 2023-05-04 MED ORDER — DILTIAZEM HCL 30 MG PO TABS
15.0000 mg | ORAL_TABLET | Freq: Four times a day (QID) | ORAL | Status: DC
  Filled 2023-05-04: qty 0.5

## 2023-05-04 MED ORDER — MELATONIN 3 MG PO TABS
3.0000 mg | ORAL_TABLET | Freq: Every evening | ORAL | Status: DC | PRN
  Administered 2023-05-04: 3 mg via ORAL
  Filled 2023-05-04: qty 1

## 2023-05-04 MED ORDER — DILTIAZEM HCL ER COATED BEADS 180 MG PO CP24
180.0000 mg | ORAL_CAPSULE | Freq: Every day | ORAL | Status: DC
  Administered 2023-05-05 – 2023-05-09 (×5): 180 mg via ORAL
  Filled 2023-05-04 (×5): qty 1

## 2023-05-04 MED ORDER — DILTIAZEM HCL 30 MG PO TABS: 30.0000 mg | ORAL_TABLET | Freq: Once | ORAL | Status: DC

## 2023-05-04 MED ORDER — DILTIAZEM HCL 30 MG PO TABS
30.0000 mg | ORAL_TABLET | Freq: Two times a day (BID) | ORAL | Status: AC
  Administered 2023-05-04 (×2): 30 mg via ORAL
  Filled 2023-05-04 (×2): qty 1

## 2023-05-04 NOTE — Progress Notes (Signed)
 E-Link was called and informed that patient is having a productive and and a request was made to receive medication a new order was received.  During the night tele called and said HR was sustaining in 150 but strip showed artifact and patient has tremors very bad patient was restless E-Link notified and medication was ordered. Patient was attempting to remove the c-pap also so I removed it and placed him back on nasal cannula 2l will reevaluate to see results of medication

## 2023-05-04 NOTE — Progress Notes (Signed)
 E-Link was notified that patient continues to try to get OOB and remove his O2 and remove his monitoring requested a sitter

## 2023-05-04 NOTE — Progress Notes (Addendum)
 [facility]                                                                              [name], is a 83 y.o. male, DOB - October 01, 1940, [id number] Admit date - 05/01/2023    Outpatient Primary MD for the patient is Mosetta Putt, MD  LOS - 3  days  Chief Complaint  Patient presents with   Altered Mental Status   Fever       Brief summary   Patient is a 83 year old male with multiple myeloma (on daratumumuab and dexamethasone) , Afib (pradaxa, metop & dilt), HTN, OSA on CPAP presented to [facility] ED 3/3 w AMS, associated fever & hypoxia. Was found w gurgling respirations. Intubated in ED as he was obtunded -- this was /w family before hand. Flu A positive. Febrile, c/f sepsis started on empiric antibiotics.  Patient was admitted by pulmonary critical care to ICU.   Significant Hospital Events:   3/3 ED w AMS fever hypoxia. Intubated. Admit to ICU w Flu A  3/3 off levophed 3/4 extubated 3/6: Transferred to the floor, TRH assumes care  Assessment & Plan    Principal Problem:   Acute respiratory failure with hypoxemia (HCC) Aspiration pneumonia, influenza A infection -Extubated on 3/4, currently on room air -Placed on Tamiflu for 5 days -Initially placed on IV Zosyn, Zithromax, now narrowed to Augmentin -PT OT evaluation -SLP evaluation 3/5, recommended regular thin liquids    Active Problems: Septic shock, POA, lactic acidosis -Due to #1, off the vasopressors  -Sepsis physiology improving   Chronic atrial fibrillation with RVR -Was noted to be on amiodarone drip in ICU, discontinued -Resumed Toprol-XL 25 mg daily -Heart rate still not well-controlled, increase Cardizem CD to 180 mg daily -Resumed Pradaxa  Hematuria -Suspected traumatic from F[place], heparin drip was discontinued -Improved, continue Avodart  Acute metabolic encephalopathy, superimposed on dementia, generalized debility -Still confused, pulling at lines -Aricept, Namenda  resumed, improve mobility -Corporate investment banker -PT evaluation recommended SNF  CKD stage IIIa -Baseline creatinine, 1.2 -Currently creatinine 1.3, continue to monitor, avoid nephrotoxic meds  Hyponatremia -Resolved   Estimated body mass index is 27.4 kg/m as calculated from the following:   Height as of this encounter: 5' 9.02" (1.753 m).   Weight as of this encounter: 84.2 kg.  Code Status: Full code DVT Prophylaxis:  SCDs Start: 05/01/23 0433 dabigatran (PRADAXA) capsule 150 mg   Level of Care: Level of care: Telemetry Medical Family Communication: Updated patient Disposition Plan:      Remains inpatient appropriate:   Likely will need SNF   Procedures:  Intubation, extubation  Consultants:   Admitted by CCM  Antimicrobials:   Anti-infectives (From admission, onward)    Start     Dose/Rate Route Frequency Ordered Stop   05/03/23 1515  amoxicillin-clavulanate (AUGMENTIN) 875-125 MG per tablet 1 tablet        1 tablet Oral Every 12 hours 05/03/23 1415 05/08/23 0959   05/03/23 1000  acyclovir (ZOVIRAX) 200 MG capsule 400 mg        400 mg Oral 2 times daily 05/03/23 0731     05/03/23 1000  azithromycin [facility]) tablet 500 mg        500 mg Oral Daily 05/03/23 0731 05/03/23 1007   05/03/23 1000  oseltamivir (TAMIFLU) capsule 30 mg       Placed in "Followed by" Linked Group   30 mg Oral 2 times daily 05/03/23 0731 05/06/23 2159   05/02/23 0300  vancomycin (VANCOREADY) IVPB 1250 mg/250 mL  Status:  Discontinued        1,250 mg 166.7 mL/hr over 90 Minutes Intravenous Every 24 hours 05/01/23 1444 05/02/23 1259   05/01/23 2200  oseltamivir (TAMIFLU) capsule 30 mg  Status:  Discontinued       Placed in "Followed by" Linked Group   30 mg Per Tube 2 times daily 05/01/23 0442 05/01/23 1416   05/01/23 2200  oseltamivir (TAMIFLU) capsule 30 mg  Status:  Discontinued       Placed in "Followed by" Linked Group   30 mg Per Tube 2 times daily 05/01/23 1416 05/03/23 0732   05/01/23  1515  oseltamivir (TAMIFLU) capsule 75 mg       Placed in "Followed by" Linked Group   75 mg Per Tube  Once 05/01/23 1416 05/01/23 1542   05/01/23 1400  piperacillin-tazobactam (ZOSYN) IVPB 3.375 g  Status:  Discontinued        3.375 g 12.5 mL/hr over 240 Minutes Intravenous Every 8 hours 05/01/23 1014 05/01/23 1151   05/01/23 1330  azithromycin (ZITHROMAX) tablet 500 mg  Status:  Discontinued        500 mg Per Tube Daily 05/01/23 1241 05/03/23 0732   05/01/23 1245  piperacillin-tazobactam (ZOSYN) IVPB 3.375 g  Status:  Discontinued        3.375 g 12.5 mL/hr over 240 Minutes Intravenous Every 8 hours 05/01/23 1151 05/03/23 1350   05/01/23 1200  Ampicillin-Sulbactam (UNASYN) 3 g in sodium chloride 0.9 % 100 mL IVPB  Status:  Discontinued        3 g 200 mL/hr over 30 Minutes Intravenous Every 6 hours 05/01/23 0546 05/01/23 1010   05/01/23 1000  oseltamivir (TAMIFLU) capsule 75 mg  Status:  Discontinued        75 mg Oral Daily 05/01/23 0425 05/01/23 0441   05/01/23 0445  oseltamivir (TAMIFLU) capsule 75 mg  Status:  Discontinued       Placed in "Followed by" Linked Group   75 mg Per Tube  Once 05/01/23 0442 05/01/23 1416   05/01/23 0439  acyclovir (ZOVIRAX) 200 MG capsule 400 mg  Status:  Discontinued        400 mg Per Tube 2 times daily 05/01/23 0442 05/03/23 0732   05/01/23 0300  ceFEPIme (MAXIPIME) 2 g in sodium chloride 0.9 % 100 mL IVPB        2 g 200 mL/hr over 30 Minutes Intravenous  Once 05/01/23 0248 05/01/23 0347   05/01/23 0300  metroNIDAZOLE (FLAGYL) IVPB 500 mg        500 mg 100 mL/hr over 60 Minutes Intravenous  Once 05/01/23 0248 05/01/23 0414   05/01/23 0300  vancomycin (VANCOCIN) IVPB 1000 mg/200 mL premix  Status:  Discontinued        1,000 mg 200 mL/hr over 60 Minutes Intravenous  Once 05/01/23 0248 05/01/23 0252   05/01/23 0300  vancomycin (VANCOREADY) IVPB 1500 mg/300 mL        1,500 mg 150 mL/hr over 120 Minutes Intravenous  Once 05/01/23 0252 05/01/23 0606           Medications  acyclovir  400 mg Oral BID   amoxicillin-clavulanate  1 tablet Oral Q12H   arformoterol  15 mcg Nebulization BID   budesonide (PULMICORT) nebulizer solution  0.5 mg Nebulization BID   calcium citrate  400 mg of elemental calcium Oral Daily   Chlorhexidine Gluconate Cloth  6 each Topical Daily   dabigatran  150 mg Oral Q12H   diltiazem  120 mg Oral Daily   docusate sodium  100 mg Oral BID   donepezil  10 mg Oral QHS   dutasteride  0.5 mg Oral Daily   insulin aspart  0-5 Units Subcutaneous QHS   insulin aspart  0-9 Units Subcutaneous TID WC   memantine  10 mg Oral Daily   metoprolol succinate  25 mg Oral Daily   montelukast  10 mg Oral QHS   oseltamivir  30 mg Oral BID   pantoprazole  40 mg Oral QHS   polyethylene glycol  17 g Oral Daily   pravastatin  20 mg Oral q1800   tadalafil  5 mg Oral Daily      Subjective:   [name] was seen and examined today.  Still somewhat confused, trying to get up, sitter in the room.  No acute issues, chest pain or shortness of breath.  Low-grade temp 100.6 F last night.  Heart rate improving.   Objective:   Vitals:   05/04/23 0024 05/04/23 0500 05/04/23 0605 05/04/23 0735  BP:   (!) 165/93 (!) 169/83  Pulse:   84 72  Resp:   18   Temp: 99.5 F (37.5 C)  98.4 F (36.9 C) 99 F (37.2 C)  TempSrc: Oral  Oral Oral  SpO2:      Weight:  84.2 kg    Height:        Intake/Output Summary (Last 24 hours) at 05/04/2023 1000 Last data filed at 05/04/2023 0800 Gross per 24 hour  Intake 351.59 ml  Output 200 ml  Net 151.59 ml     Wt Readings from Last 3 Encounters:  05/04/23 84.2 kg  02/03/23 80.3 kg  10/28/22 81.1 kg     Exam General: Alert and oriented to self, NAD Cardiovascular: Irregularly regular Respiratory: Scattered coarse rhonchi Gastrointestinal: Soft, nontender, nondistended, + bowel sounds Ext: no pedal edema bilaterally Neuro: moving all 4 extremities Psych: dementia    Data Reviewed:   I have personally reviewed following labs    CBC Lab Results  Component Value Date   WBC 6.9 05/04/2023   RBC 3.86 (L) 05/04/2023   HGB 12.2 (L) 05/04/2023   HCT 35.1 (L) 05/04/2023   MCV 90.9 05/04/2023   MCH 31.6 05/04/2023   PLT 119 (L) 05/04/2023   MCHC 34.8 05/04/2023   RDW 16.0 (H) 05/04/2023   LYMPHSABS 0.1 (L) 05/01/2023   MONOABS 0.3 05/01/2023   EOSABS 0.0 05/01/2023   BASOSABS 0.0 05/01/2023     Last metabolic panel Lab Results  Component Value Date   NA 136 05/04/2023   K 3.6 05/04/2023   CL 106 05/04/2023   CO2 21 (L) 05/04/2023   BUN 23 05/04/2023   CREATININE 1.35 (H) 05/04/2023   GLUCOSE 109 (H) 05/04/2023   GFRNONAA 52 (L) 05/04/2023   GFRAA 61 08/29/2018   CALCIUM 7.9 (L) 05/04/2023   PHOS 2.7 02/01/2023   PROT 6.3 (L) 05/01/2023   ALBUMIN 3.5 05/01/2023   LABGLOB 3.5 08/29/2018   AGRATIO 1.1 (L) 08/29/2018   BILITOT 1.1 05/01/2023   ALKPHOS 65 05/01/2023   AST  26 05/01/2023   ALT 19 05/01/2023   ANIONGAP 9 05/04/2023    CBG (last 3)  Recent Labs    05/04/23 0044 05/04/23 0507 05/04/23 0908  GLUCAP 100* 124* 138*      Coagulation Profile: Recent Labs  Lab 05/01/23 0248  INR 1.1     Radiology Studies: I have personally reviewed the imaging studies  No results found.     Thad Ranger M.D. [facility] 05/04/2023, 10:00 AM  Available via Epic secure chat 7am-7pm After 7 pm, please refer to night coverage provider listed on amion.

## 2023-05-04 NOTE — Progress Notes (Signed)
 eLink Physician-Brief Progress Note Patient Name: [name]. DOB: 1940-04-07 MRN: [id number]   Date of Service  05/04/2023  HPI/Events of Note  Nurse reports afib with rvr. To have lopressor and cardizem this am.  eICU Interventions  EKG now      Intervention Category Intermediate Interventions: Arrhythmia - evaluation and management  Henry Russel, P 05/04/2023, 6:54 AM

## 2023-05-04 NOTE — Plan of Care (Signed)
  Problem: Education: Goal: Ability to describe self-care measures that may prevent or decrease complications (Diabetes Survival Skills Education) will improve Outcome: Not Progressing   Problem: Coping: Goal: Ability to adjust to condition or change in health will improve Outcome: Progressing   Problem: Fluid Volume: Goal: Ability to maintain a balanced intake and output will improve Outcome: Progressing   Problem: Health Behavior/Discharge Planning: Goal: Ability to identify and utilize available resources and services will improve Outcome: Not Progressing Goal: Ability to manage health-related needs will improve Outcome: Not Progressing   Problem: Metabolic: Goal: Ability to maintain appropriate glucose levels will improve Outcome: Progressing   Problem: Nutritional: Goal: Maintenance of adequate nutrition will improve Outcome: Progressing Goal: Progress toward achieving an optimal weight will improve Outcome: Progressing   Problem: Skin Integrity: Goal: Risk for impaired skin integrity will decrease Outcome: Progressing   Problem: Tissue Perfusion: Goal: Adequacy of tissue perfusion will improve Outcome: Progressing   Problem: Respiratory: Goal: Ability to maintain a clear airway and adequate ventilation will improve Outcome: Progressing   Problem: Respiratory: Goal: Ability to maintain a clear airway and adequate ventilation will improve Outcome: Progressing   Problem: Role Relationship: Goal: Method of communication will improve Outcome: Progressing

## 2023-05-04 NOTE — Progress Notes (Signed)
 E-Link was called HR 160 bpm The patient had attempted to get OOB and having tremors. I inquired if I could give the Cardizem and Lopressor that scheduled for 1000 earlier

## 2023-05-04 NOTE — TOC Progression Note (Signed)
 Transition of Care (TOC) - Progression Note    Patient Details  Name: [name]. MRN: [id number] Date of Birth: 04/23/1940  Transition of Care [facility]) CM/SW Contact  Carley Hammed, LCSW Phone Number: 05/04/2023, 2:00 PM  Clinical Narrative:    Pt transferred to unit yesterday. CSW has reviewed chart and noted plan will be for SNF at DC with a preference for [facility]. Per chart, pt now requiring sitter and continued medical interventions. TOC will continue to follow for DC planning.      Barriers to Discharge: Continued Medical Work up  Expected Discharge Plan and Services       Living arrangements for the past 2 months: Single Family Home                                       Social Determinants of Health (SDOH) Interventions SDOH Screenings   Food Insecurity: No Food Insecurity (05/01/2023)  Housing: Low Risk  (05/01/2023)  Transportation Needs: No Transportation Needs (05/01/2023)  Utilities: Not At Risk (05/01/2023)  Social Connections: Patient Unable To Answer (05/01/2023)  Tobacco Use: Medium Risk (05/01/2023)    Readmission Risk Interventions     No data to display

## 2023-05-04 NOTE — Progress Notes (Signed)
 eLink Physician-Brief Progress Note Patient Name: [name]. DOB: [birth year] MRN: [id number]   Date of Service  05/04/2023  HPI/Events of Note  Nurse reporst patient conrinually trying to get out of bed and taking cpap and leads off  eICU Interventions  Safety sitter     Intervention Category Intermediate Interventions: Other:  Henry Russel, P 05/04/2023, 4:30 AM

## 2023-05-05 DIAGNOSIS — J9601 Acute respiratory failure with hypoxia: Secondary | ICD-10-CM | POA: Diagnosis not present

## 2023-05-05 LAB — BASIC METABOLIC PANEL
Anion gap: 7 (ref 5–15)
BUN: 14 mg/dL (ref 8–23)
CO2: 27 mmol/L (ref 22–32)
Calcium: 8.1 mg/dL — ABNORMAL LOW (ref 8.9–10.3)
Chloride: 104 mmol/L (ref 98–111)
Creatinine, Ser: 1.08 mg/dL (ref 0.61–1.24)
GFR, Estimated: >60 mL/min (ref >60)
Glucose, Bld: 107 mg/dL — ABNORMAL HIGH (ref 70–99)
Potassium: 3.4 mmol/L — ABNORMAL LOW (ref 3.5–5.1)
Sodium: 138 mmol/L (ref 135–145)

## 2023-05-05 LAB — CBC
HCT: 31.9 % — ABNORMAL LOW (ref 39.0–52.0)
Hemoglobin: 11 g/dL — ABNORMAL LOW (ref 13.0–17.0)
MCH: 31.7 pg (ref 26.0–34.0)
MCHC: 34.5 g/dL (ref 30.0–36.0)
MCV: 91.9 fL (ref 80.0–100.0)
Platelets: 96 10*3/uL — ABNORMAL LOW (ref 150–400)
RBC: 3.47 MIL/uL — ABNORMAL LOW (ref 4.22–5.81)
RDW: 15.9 % — ABNORMAL HIGH (ref 11.5–15.5)
WBC: 4.3 10*3/uL (ref 4.0–10.5)
nRBC: 0 % (ref 0.0–0.2)

## 2023-05-05 LAB — GLUCOSE, CAPILLARY
Glucose-Capillary: 97 mg/dL (ref 70–99)
Glucose-Capillary: 111 mg/dL — ABNORMAL HIGH (ref 70–99)
Glucose-Capillary: 112 mg/dL — ABNORMAL HIGH (ref 70–99)
Glucose-Capillary: 130 mg/dL — ABNORMAL HIGH (ref 70–99)

## 2023-05-05 NOTE — TOC Progression Note (Addendum)
 Transition of Care (TOC) - Progression Note    Patient Details  Name: [name]. MRN: [id number] Date of Birth: 12/30/1940  [facility] Care [facility]) CM/SW Contact  Carley Hammed, LCSW Phone Number: 05/05/2023, 10:41 AM  Clinical Narrative:    CSW notified by RN that pt has been much calmer, planning to DC sitter. CSW spoke with dtr to confirm DC plans. Per dtr, pt has 24 hour care at home and has been using [facility] for PT/ OT. Dtr wanted to discuss the option of taking pt home with additional supports at DC. CSW noted pt would be working with PT again today and plan can be confirmed after seeing how pt does today. Dtr to follow up with 24 hour care to make sure it can be put in place at DC. CSW left VM for [facility] rep. TOC will continue to follow.   CSW spoke with [facility] and they are agreeable to pt getting services again at DC. [facility] and dtr to discuss upping services. TOC will continue to follow.    Barriers to Discharge: Continued Medical Work up  Expected Discharge Plan and Services       Living arrangements for the past 2 months: Single Family Home                                       Social Determinants of Health (SDOH) Interventions SDOH Screenings   Food Insecurity: No Food Insecurity (05/01/2023)  Housing: Low Risk  (05/01/2023)  Transportation Needs: No Transportation Needs (05/01/2023)  Utilities: Not At Risk (05/01/2023)  Social Connections: Patient Unable To Answer (05/01/2023)  Tobacco Use: Medium Risk (05/01/2023)    Readmission Risk Interventions     No data to display

## 2023-05-05 NOTE — Progress Notes (Signed)
 Summer Cain Saupe, contact in pt's chart called for an update on plan of care for the pt.

## 2023-05-05 NOTE — Plan of Care (Signed)
  Problem: Coping: Goal: Ability to adjust to condition or change in health will improve Outcome: Progressing   Problem: Nutritional: Goal: Maintenance of adequate nutrition will improve Outcome: Progressing   Problem: Activity: Goal: Risk for activity intolerance will decrease Outcome: Progressing   Problem: Safety: Goal: Ability to remain free from injury will improve Outcome: Progressing

## 2023-05-05 NOTE — Progress Notes (Signed)
 [facility]                                                                              [name], is a 83 y.o. male, DOB - [birth year], [id number] Admit date - 05/01/2023    Outpatient Primary MD for the patient is Mosetta Putt, MD  LOS - 4  days  Chief Complaint  Patient presents with   Altered Mental Status   Fever       Brief summary   Patient is a 83 year old male with multiple myeloma (on daratumumuab and dexamethasone) , Afib (pradaxa, metop & dilt), HTN, OSA on CPAP presented to [facility] ED 3/3 w AMS, associated fever & hypoxia. Was found w gurgling respirations. Intubated in ED as he was obtunded -- this was /w family before hand. Flu A positive. Febrile, c/f sepsis started on empiric antibiotics.  Patient was admitted by pulmonary critical care to ICU.   05/05/2023: Patient seen.  No complaints.  Patient is resting quietly.  Significant Hospital Events:   3/3 ED w AMS fever hypoxia. Intubated. Admit to ICU w Flu A  3/3 off levophed 3/4 extubated 3/6: Transferred to the floor, TRH assumes care  Assessment & Plan  Acute respiratory failure with hypoxemia (HCC) Aspiration pneumonia, influenza A infection -Extubated on 3/4, currently on room air -Placed on Tamiflu for 5 days -Initially placed on IV Zosyn, Zithromax, now narrowed to Augmentin -PT OT evaluation -SLP evaluation 3/5, recommended regular thin liquids  Septic shock, POA, lactic acidosis -Due to #1, off the vasopressors  -Sepsis physiology has resolved.  Chronic atrial fibrillation with RVR -Was noted to be on amiodarone drip in ICU, discontinued -Resumed Toprol-XL 25 mg daily -Heart rate still not well-controlled, increase Cardizem CD to 180 mg daily -Resumed Pradaxa 05/05/2023: Heart rate is controlled.  Hematuria -Suspected traumatic from F[place], heparin drip was discontinued -Improved, continue Avodart  Acute metabolic encephalopathy, superimposed on dementia, generalized  debility -Still confused, pulling at lines -Aricept, Namenda resumed, improve mobility -Corporate investment banker -PT evaluation recommended SNF  CKD stage IIIa -Baseline creatinine, 1.2 -Current creatinine is 1.08 today.  Hyponatremia -Resolved Sodium of 138.   Estimated body mass index is 27.63 kg/m as calculated from the following:   Height as of this encounter: 5' 9.02" (1.753 m).   Weight as of this encounter: 84.9 kg.  Code Status: Full code DVT Prophylaxis:  SCDs Start: 05/01/23 0433 dabigatran (PRADAXA) capsule 150 mg   Level of Care: Level of care: Telemetry Medical Family Communication: Updated patient Disposition Plan:      Remains inpatient appropriate:   Likely will need SNF   Procedures:  Intubation, extubation  Consultants:   Admitted by CCM  Antimicrobials:   Anti-infectives (From admission, onward)    Start     Dose/Rate Route Frequency Ordered Stop   05/03/23 1515  amoxicillin-clavulanate (AUGMENTIN) 875-125 MG per tablet 1 tablet        1 tablet Oral Every 12 hours 05/03/23 1415 05/08/23 0959   05/03/23 1000  acyclovir (ZOVIRAX) 200 MG capsule 400 mg        400 mg Oral 2 times daily 05/03/23 0731  05/03/23 1000  azithromycin (ZITHROMAX) tablet 500 mg        500 mg Oral Daily 05/03/23 0731 05/03/23 1007   05/03/23 1000  oseltamivir (TAMIFLU) capsule 30 mg       Placed in "Followed by" Linked Group   30 mg Oral 2 times daily 05/03/23 0731 05/06/23 2159   05/02/23 0300  vancomycin (VANCOREADY) IVPB 1250 mg/250 mL  Status:  Discontinued        1,250 mg 166.7 mL/hr over 90 Minutes Intravenous Every 24 hours 05/01/23 1444 05/02/23 1259   05/01/23 2200  oseltamivir (TAMIFLU) capsule 30 mg  Status:  Discontinued       Placed in "Followed by" Linked Group   30 mg Per Tube 2 times daily 05/01/23 0442 05/01/23 1416   05/01/23 2200  oseltamivir (TAMIFLU) capsule 30 mg  Status:  Discontinued       Placed in "Followed by" Linked Group   30 mg Per Tube 2 times  daily 05/01/23 1416 05/03/23 0732   05/01/23 1515  oseltamivir (TAMIFLU) capsule 75 mg       Placed in "Followed by" Linked Group   75 mg Per Tube  Once 05/01/23 1416 05/01/23 1542   05/01/23 1400  piperacillin-tazobactam (ZOSYN) IVPB 3.375 g  Status:  Discontinued        3.375 g 12.5 mL/hr over 240 Minutes Intravenous Every 8 hours 05/01/23 1014 05/01/23 1151   05/01/23 1330  azithromycin (ZITHROMAX) tablet 500 mg  Status:  Discontinued        500 mg Per Tube Daily 05/01/23 1241 05/03/23 0732   05/01/23 1245  piperacillin-tazobactam (ZOSYN) IVPB 3.375 g  Status:  Discontinued        3.375 g 12.5 mL/hr over 240 Minutes Intravenous Every 8 hours 05/01/23 1151 05/03/23 1350   05/01/23 1200  Ampicillin-Sulbactam (UNASYN) 3 g in sodium chloride 0.9 % 100 mL IVPB  Status:  Discontinued        3 g 200 mL/hr over 30 Minutes Intravenous Every 6 hours 05/01/23 0546 05/01/23 1010   05/01/23 1000  oseltamivir (TAMIFLU) capsule 75 mg  Status:  Discontinued        75 mg Oral Daily 05/01/23 0425 05/01/23 0441   05/01/23 0445  oseltamivir (TAMIFLU) capsule 75 mg  Status:  Discontinued       Placed in "Followed by" Linked Group   75 mg Per Tube  Once 05/01/23 0442 05/01/23 1416   05/01/23 0439  acyclovir (ZOVIRAX) 200 MG capsule 400 mg  Status:  Discontinued        400 mg Per Tube 2 times daily 05/01/23 0442 05/03/23 0732   05/01/23 0300  ceFEPIme (MAXIPIME) 2 g in sodium chloride 0.9 % 100 mL IVPB        2 g 200 mL/hr over 30 Minutes Intravenous  Once 05/01/23 0248 05/01/23 0347   05/01/23 0300  metroNIDAZOLE (FLAGYL) IVPB 500 mg        500 mg 100 mL/hr over 60 Minutes Intravenous  Once 05/01/23 0248 05/01/23 0414   05/01/23 0300  vancomycin (VANCOCIN) IVPB 1000 mg/200 mL premix  Status:  Discontinued        1,000 mg 200 mL/hr over 60 Minutes Intravenous  Once 05/01/23 0248 05/01/23 0252   05/01/23 0300  vancomycin (VANCOREADY) IVPB 1500 mg/300 mL        1,500 mg 150 mL/hr over 120 Minutes  Intravenous  Once 05/01/23 0252 05/01/23 0606  Medications  acyclovir  400 mg Oral BID   amoxicillin-clavulanate  1 tablet Oral Q12H   arformoterol  15 mcg Nebulization BID   budesonide (PULMICORT) nebulizer solution  0.5 mg Nebulization BID   calcium citrate  400 mg of elemental calcium Oral Daily   Chlorhexidine Gluconate Cloth  6 each Topical Daily   dabigatran  150 mg Oral Q12H   diltiazem  180 mg Oral Daily   docusate sodium  100 mg Oral BID   donepezil  10 mg Oral QHS   dutasteride  0.5 mg Oral Daily   insulin aspart  0-5 Units Subcutaneous QHS   insulin aspart  0-9 Units Subcutaneous TID WC   memantine  10 mg Oral Daily   metoprolol succinate  25 mg Oral Daily   montelukast  10 mg Oral QHS   oseltamivir  30 mg Oral BID   pantoprazole  40 mg Oral QHS   polyethylene glycol  17 g Oral Daily   pravastatin  20 mg Oral q1800   tadalafil  5 mg Oral Daily      Subjective:  No history from patient.  Objective:   Vitals:   05/05/23 0829 05/05/23 1031 05/05/23 1525 05/05/23 1940  BP:  (!) 145/91 (!) 166/82 (!) 159/81  Pulse: 96 96 83 80  Resp:   16 20  Temp:   99.1 F (37.3 C) 98.4 F (36.9 C)  TempSrc:   Oral Oral  SpO2: 96%  98% 100%  Weight:      Height:        Intake/Output Summary (Last 24 hours) at 05/05/2023 1941 Last data filed at 05/05/2023 0414 Gross per 24 hour  Intake 100 ml  Output 400 ml  Net -300 ml     Wt Readings from Last 3 Encounters:  05/05/23 84.9 kg  02/03/23 80.3 kg  10/28/22 81.1 kg     Exam General: Alert and oriented to self, NAD Cardiovascular: Irregularly regular Respiratory: Scattered coarse rhonchi Gastrointestinal: Soft, nontender, nondistended, + bowel sounds Ext: no pedal edema bilaterally Neuro: moving all 4 extremities Psych: dementia    Data Reviewed:  I have personally reviewed following labs    CBC Lab Results  Component Value Date   WBC 4.3 05/05/2023   RBC 3.47 (L) 05/05/2023   HGB 11.0 (L)  05/05/2023   HCT 31.9 (L) 05/05/2023   MCV 91.9 05/05/2023   MCH 31.7 05/05/2023   PLT 96 (L) 05/05/2023   MCHC 34.5 05/05/2023   RDW 15.9 (H) 05/05/2023   LYMPHSABS 0.1 (L) 05/01/2023   MONOABS 0.3 05/01/2023   EOSABS 0.0 05/01/2023   BASOSABS 0.0 05/01/2023     Last metabolic panel Lab Results  Component Value Date   NA 138 05/05/2023   K 3.4 (L) 05/05/2023   CL 104 05/05/2023   CO2 27 05/05/2023   BUN 14 05/05/2023   CREATININE 1.08 05/05/2023   GLUCOSE 107 (H) 05/05/2023   GFRNONAA >60 05/05/2023   GFRAA 61 08/29/2018   CALCIUM 8.1 (L) 05/05/2023   PHOS 2.7 02/01/2023   PROT 6.3 (L) 05/01/2023   ALBUMIN 3.5 05/01/2023   LABGLOB 3.5 08/29/2018   AGRATIO 1.1 (L) 08/29/2018   BILITOT 1.1 05/01/2023   ALKPHOS 65 05/01/2023   AST 26 05/01/2023   ALT 19 05/01/2023   ANIONGAP 7 05/05/2023    CBG (last 3)  Recent Labs    05/05/23 0752 05/05/23 1231 05/05/23 1643  GLUCAP 97 111* 112*      Coagulation  Profile: Recent Labs  Lab 05/01/23 0248  INR 1.1     Radiology Studies: I have personally reviewed the imaging studies  No results found.     Barnetta Chapel M.D. [facility] 05/05/2023, 7:41 PM  Available via Epic secure chat 7am-7pm After 7 pm, please refer to night coverage provider listed on amion.

## 2023-05-05 NOTE — Progress Notes (Signed)
 Safety mittens off and pt is calm and cooperative, floor mats in place and bed at lowest position. Pt has been instructed to press call bell for assistance. Pt said, "OK."

## 2023-05-05 NOTE — Progress Notes (Signed)
 Per daughter, [name], the family has decided to provide 24-hour care for pt while he's in the hospital. Janina Mayo, Bennie Pierini will be on duty from 2000-0800 and then [place] will be on duty at 0800 on 05/06/23.

## 2023-05-05 NOTE — Progress Notes (Signed)
 Physical Therapy Treatment Patient Details Name: [name]. MRN: [id number] DOB: 02/28/1941 Today's Date: 05/05/2023   History of Present Illness Pt is an 83 y/o M presenting to ED on 3/3 with AMS, associated fever and hypoxia, intubated in ED and extubated 3/4. Found to have Flu A, sepsis, Admitted for acute respiratory failure with hypoxia. PMH includes multiple myeloma, A fib, HTN.    PT Comments  Pt received in supine, A&O x1 and with good participation but poor initiation and needing increased cues and lift assist this date due to slow processing/cognitive deficit. Pt needing up to +2 maxA for sit<>stand and nearly totalA for bed mobility with use of hospital bed features. HR/SpO2 WFL on 3L O2 [place]. Pt with loose stools observed when he stood and provided with peri-care (totalA for hygiene assist). Patient will benefit from continued inpatient follow up therapy, <3 hours/day. DME updated below depending on disposition as pt needing increased assist this date, discussed with supervising PT Ryan L.     If plan is discharge home, recommend the following: A lot of help with walking and/or transfers;A lot of help with bathing/dressing/bathroom;Assistance with cooking/housework;Assist for transportation;Supervision due to cognitive status   Can travel by private vehicle     Yes  Equipment Recommendations  [place] lift;Hospital bed    Recommendations for Other Services       Precautions / Restrictions Precautions Precautions: Fall Recall of Precautions/Restrictions: Impaired Precaution/Restrictions Comments: watch HR and SpO2 Restrictions Weight Bearing Restrictions Per Provider Order: No     Mobility  Bed Mobility Overal bed mobility: Needs Assistance Bed Mobility: Sit to Supine, Rolling, Sidelying to Sit Rolling: Max assist, +2 for physical assistance, Used rails Sidelying to sit: Max assist, +2 for physical assistance, HOB elevated, Used rails   Sit to supine: Max assist, +2  for physical assistance   General bed mobility comments: when cued to "sit up", pt does not move at all, so needing increased time and dense multimodal cues, pt needs hand over hand assist to sequence to get to L EOB and for return to supine.    Transfers Overall transfer level: Needs assistance Equipment used: Rolling walker (2 wheels) Transfers: Sit to/from Stand Sit to Stand: Max assist, +2 physical assistance, From elevated surface, Via lift equipment           General transfer comment: elevated bed<>Stedy x3 trials, pt unable to maintain standing >30 seconds while peri-care performed so he took x2 seated breaks between trials to allow time to be cleaned thoroughly, RN notified pt had BM. Pt needing totalA +2 for lateral seated scooting toward HOB x3 scoots Transfer via Lift Equipment: Stedy  Ambulation/Gait               General Gait Details: pt unable   Stairs             Wheelchair Mobility     Tilt Bed    Modified Rankin (Stroke Patients Only)       Balance Overall balance assessment: Needs assistance Sitting-balance support: Feet supported Sitting balance-Leahy Scale: Poor Sitting balance - Comments: better with BUE support of Stedy in front of him; external support unless pt holding end of bed Postural control: Posterior lean Standing balance support: Reliant on assistive device for balance, Bilateral upper extremity supported Standing balance-Leahy Scale: Zero Standing balance comment: +2 maxA for all trials today  Communication Communication Communication: No apparent difficulties  Cognition Arousal: Alert Behavior During Therapy: WFL for tasks assessed/performed   PT - Cognitive impairments: Orientation, Awareness, Memory, Sequencing, Problem solving, Safety/Judgement, Attention, Initiation   Orientation impairments: Place, Time, Situation                   PT - Cognition Comments: dementia  at baseline, pt guarding much more this date and needing dense multimodal cues with poor initiation and very stiff posture throughout. Following commands: Intact      Cueing Cueing Techniques: Verbal cues, Gestural cues  Exercises Other Exercises Other Exercises: supine BLE AAROM: ankle pumps, heel slides, hip abduction, SLR x5 reps ea    General Comments General comments (skin integrity, edema, etc.): SpO2 WFL on 3L O2 [place], HR 90's bpm with exertion.      Pertinent Vitals/Pain Pain Assessment Pain Assessment: PAINAD Breathing: occasional labored breathing, short period of hyperventilation Negative Vocalization: none Facial Expression: sad, frightened, frown Body Language: tense, distressed pacing, fidgeting Consolability: distracted or reassured by voice/touch PAINAD Score: 4 Pain Intervention(s): Limited activity within patient's tolerance, Monitored during session, Repositioned, Other (comment) (pt provided with peri-care, grimacing during clean-up, RN notified of pt loose stools)    Home Living                          Prior Function            PT Goals (current goals can now be found in the care plan section) Acute Rehab PT Goals Patient Stated Goal: not stated PT Goal Formulation: Patient unable to participate in goal setting Time For Goal Achievement: 05/17/23 Progress towards PT goals: Not progressing toward goals - comment    Frequency    Min 2X/week      PT Plan      Co-evaluation              AM-PAC PT "6 Clicks" Mobility   Outcome Measure  Help needed turning from your back to your side while in a flat bed without using bedrails?: A Lot Help needed moving from lying on your back to sitting on the side of a flat bed without using bedrails?: Total Help needed moving to and from a bed to a chair (including a wheelchair)?: Total Help needed standing up from a chair using your arms (e.g., wheelchair or bedside chair)?: Total Help needed  to walk in hospital room?: Total Help needed climbing 3-5 steps with a railing? : Total 6 Click Score: 7    End of Session Equipment Utilized During Treatment: Gait belt;Oxygen Activity Tolerance: Patient limited by fatigue;Other (comment) (loose stools and pt difficulty following mobility instructions) Patient left: in bed;with call bell/phone within reach;with bed alarm set;Other (comment) (HOB elevated, unable to achieve full chair posture due to end of bed broken, unit secretary called to order a new bed) Nurse Communication: Mobility status;Need for lift equipment;Other (comment) (need new bed, loose stools) PT Visit Diagnosis: Other abnormalities of gait and mobility (R26.89);Muscle weakness (generalized) (M62.81)     Time: 1610-9604 PT Time Calculation (min) (ACUTE ONLY): 24 min  Charges:    $Therapeutic Exercise: 8-22 mins $Therapeutic Activity: 8-22 mins PT General Charges $$ ACUTE PT VISIT: 1 Visit                     Nykeria Mealing P., PTA Acute Rehabilitation Services Secure Chat Preferred 9a-5:30pm Office: [phone]    Dorathy Kinsman [facility] 05/05/2023,  5:30 PM

## 2023-05-06 ENCOUNTER — Inpatient Hospital Stay (HOSPITAL_COMMUNITY)

## 2023-05-06 DIAGNOSIS — J9601 Acute respiratory failure with hypoxia: Secondary | ICD-10-CM | POA: Diagnosis not present

## 2023-05-06 LAB — GLUCOSE, CAPILLARY
Glucose-Capillary: 106 mg/dL — ABNORMAL HIGH (ref 70–99)
Glucose-Capillary: 102 mg/dL — ABNORMAL HIGH (ref 70–99)
Glucose-Capillary: 109 mg/dL — ABNORMAL HIGH (ref 70–99)
Glucose-Capillary: 124 mg/dL — ABNORMAL HIGH (ref 70–99)
Glucose-Capillary: 114 mg/dL — ABNORMAL HIGH (ref 70–99)

## 2023-05-06 LAB — CULTURE, BLOOD (ROUTINE X 2)
Culture: NO GROWTH
Culture: NO GROWTH
Special Requests: ADEQUATE
Special Requests: ADEQUATE

## 2023-05-06 NOTE — Progress Notes (Signed)
 [facility]                                                                              [name], is a 83 y.o. male, DOB - [birth year], ZOX:[id number] Admit date - 05/01/2023    Outpatient Primary MD for the patient is Mosetta Putt, MD  LOS - 5  days  Chief Complaint  Patient presents with   Altered Mental Status   Fever       Brief summary   Patient is a 83 year old male with multiple myeloma (on daratumumuab and dexamethasone) , Afib (pradaxa, metop & dilt), HTN, OSA on CPAP presented to [facility] ED 3/3 w AMS, associated fever & hypoxia. Was found w gurgling respirations. Intubated in ED as he was obtunded -- this was /w family before hand. Flu A positive. Febrile, c/f sepsis started on empiric antibiotics.  Patient was admitted by pulmonary critical care to ICU.   05/06/2023: Patient seen.  No complaints.  Patient is resting quietly.  Gaseous distention of the abdomen noted.  Will proceed with abdominal x-ray.  Significant Hospital Events:   3/3 ED w AMS fever hypoxia. Intubated. Admit to ICU w Flu A  3/3 off levophed 3/4 extubated 3/6: Transferred to the floor, TRH assumes care  Assessment & Plan  Acute respiratory failure with hypoxemia (HCC) Aspiration pneumonia, influenza A infection -Extubated on 3/4, currently on room air -Placed on Tamiflu for 5 days -Initially placed on IV Zosyn, Zithromax, now narrowed to Augmentin -PT OT evaluation -SLP evaluation 3/5, recommended regular thin liquids  Septic shock, POA, lactic acidosis -Due to #1, off the vasopressors  -Sepsis physiology has resolved.  Chronic atrial fibrillation with RVR -Was noted to be on amiodarone drip in ICU, discontinued -Resumed Toprol-XL 25 mg daily -Heart rate still not well-controlled, increase Cardizem CD to 180 mg daily -Resumed Pradaxa 05/05/2023: Heart rate is controlled.  Hematuria -Suspected traumatic from [place], heparin drip was discontinued -Improved, continue  Avodart  Acute metabolic encephalopathy, superimposed on dementia, generalized debility -Still confused, pulling at lines -Aricept, Namenda resumed, improve mobility -[employer]ate investment [employer] -PT evaluation recommended SNF  CKD stage IIIa -Baseline creatinine, 1.2 -Current creatinine is 1.08 today.  Hyponatremia -Resolved Sodium of 138.   Estimated body mass index is 27.63 kg/m as calculated from the following:   Height as of this encounter: 5' 9.02" (1.753 m).   Weight as of this encounter: 84.9 kg.  Code Status: Full code DVT Prophylaxis:  SCDs Start: 05/01/23 0433 dabigatran (PRADAXA) capsule 150 mg   Level of Care: Level of care: Telemetry Medical Family Communication: Updated patient Disposition Plan:      Remains inpatient appropriate:   Likely will need SNF   Procedures:  Intubation, extubation  Consultants:   Admitted by CCM  Antimicrobials:   Anti-infectives (From admission, onward)    Start     Dose/Rate Route Frequency Ordered Stop   05/03/23 1515  amoxicillin-clavulanate (AUGMENTIN) 875-125 MG per tablet 1 tablet        1 tablet Oral Every 12 hours 05/03/23 1415 05/08/23 0959   05/03/23 1000  acyclovir (ZOVIRAX) 200 MG capsule 400 mg  400 mg Oral 2 times daily 05/03/23 0731     05/03/23 1000  azithromycin (ZITHROMAX) tablet 500 mg        500 mg Oral Daily 05/03/23 0731 05/03/23 1007   05/03/23 1000  oseltamivir (TAMIFLU) capsule 30 mg       Placed in "Followed by" Linked Group   30 mg Oral 2 times daily 05/03/23 0731 05/06/23 1025   05/02/23 0300  vancomycin (VANCOREADY) IVPB 1250 mg/250 mL  Status:  Discontinued        1,250 mg 166.7 mL/hr over 90 Minutes Intravenous Every 24 hours 05/01/23 1444 05/02/23 1259   05/01/23 2200  oseltamivir (TAMIFLU) capsule 30 mg  Status:  Discontinued       Placed in "Followed by" Linked Group   30 mg Per Tube 2 times daily 05/01/23 0442 05/01/23 1416   05/01/23 2200  oseltamivir (TAMIFLU) capsule 30 mg  Status:   Discontinued       Placed in "Followed by" Linked Group   30 mg Per Tube 2 times daily 05/01/23 1416 05/03/23 0732   05/01/23 1515  oseltamivir (TAMIFLU) capsule 75 mg       Placed in "Followed by" Linked Group   75 mg Per Tube  Once 05/01/23 1416 05/01/23 1542   05/01/23 1400  piperacillin-tazobactam (ZOSYN) IVPB 3.375 g  Status:  Discontinued        3.375 g 12.5 mL/hr over 240 Minutes Intravenous Every 8 hours 05/01/23 1014 05/01/23 1151   05/01/23 1330  azithromycin (ZITHROMAX) tablet 500 mg  Status:  Discontinued        500 mg Per Tube Daily 05/01/23 1241 05/03/23 0732   05/01/23 1245  piperacillin-tazobactam (ZOSYN) IVPB 3.375 g  Status:  Discontinued        3.375 g 12.5 mL/hr over 240 Minutes Intravenous Every 8 hours 05/01/23 1151 05/03/23 1350   05/01/23 1200  Ampicillin-Sulbactam (UNASYN) 3 g in sodium chloride 0.9 % 100 mL IVPB  Status:  Discontinued        3 g 200 mL/hr over 30 Minutes Intravenous Every 6 hours 05/01/23 0546 05/01/23 1010   05/01/23 1000  oseltamivir (TAMIFLU) capsule 75 mg  Status:  Discontinued        75 mg Oral Daily 05/01/23 0425 05/01/23 0441   05/01/23 0445  oseltamivir (TAMIFLU) capsule 75 mg  Status:  Discontinued       Placed in "Followed by" Linked Group   75 mg Per Tube  Once 05/01/23 0442 05/01/23 1416   05/01/23 0439  acyclovir (ZOVIRAX) 200 MG capsule 400 mg  Status:  Discontinued        400 mg Per Tube 2 times daily 05/01/23 0442 05/03/23 0732   05/01/23 0300  ceFEPIme (MAXIPIME) 2 g in sodium chloride 0.9 % 100 mL IVPB        2 g 200 mL/hr over 30 Minutes Intravenous  Once 05/01/23 0248 05/01/23 0347   05/01/23 0300  metroNIDAZOLE (FLAGYL) IVPB 500 mg        500 mg 100 mL/hr over 60 Minutes Intravenous  Once 05/01/23 0248 05/01/23 0414   05/01/23 0300  vancomycin (VANCOCIN) IVPB 1000 mg/200 mL premix  Status:  Discontinued        1,000 mg 200 mL/hr over 60 Minutes Intravenous  Once 05/01/23 0248 05/01/23 0252   05/01/23 0300  vancomycin  (VANCOREADY) IVPB 1500 mg/300 mL        1,500 mg 150 mL/hr over 120 Minutes Intravenous  Once  05/01/23 0252 05/01/23 0606          Medications  acyclovir  400 mg Oral BID   amoxicillin-clavulanate  1 tablet Oral Q12H   arformoterol  15 mcg Nebulization BID   budesonide (PULMICORT) nebulizer solution  0.5 mg Nebulization BID   calcium citrate  400 mg of elemental calcium Oral Daily   Chlorhexidine Gluconate Cloth  6 each Topical Daily   dabigatran  150 mg Oral Q12H   diltiazem  180 mg Oral Daily   docusate sodium  100 mg Oral BID   donepezil  10 mg Oral QHS   dutasteride  0.5 mg Oral Daily   insulin aspart  0-5 Units Subcutaneous QHS   insulin aspart  0-9 Units Subcutaneous TID WC   memantine  10 mg Oral Daily   metoprolol succinate  25 mg Oral Daily   montelukast  10 mg Oral QHS   pantoprazole  40 mg Oral QHS   polyethylene glycol  17 g Oral Daily   pravastatin  20 mg Oral q1800   tadalafil  5 mg Oral Daily      Subjective:  No history from patient.  Objective:   Vitals:   05/05/23 2257 05/06/23 0107 05/06/23 0843 05/06/23 1515  BP:  (!) 144/80  (!) 158/85  Pulse: 85 72  72  Resp:  18  16  Temp:  98 F (36.7 C)  (!) 97.5 F (36.4 C)  TempSrc:  Oral  Oral  SpO2: 94% 100% 98% 97%  Weight:      Height:        Intake/Output Summary (Last 24 hours) at 05/06/2023 1606 Last data filed at 05/06/2023 1610 Gross per 24 hour  Intake --  Output 1900 ml  Net -1900 ml     Wt Readings from Last 3 Encounters:  05/05/23 84.9 kg  02/03/23 80.3 kg  10/28/22 81.1 kg     Exam General: Alert and oriented to self, NAD Cardiovascular: Irregularly regular Respiratory: Scattered coarse rhonchi Gastrointestinal: Gaseous distention.  Soft and nontender.  Hyperactive bowel sounds.   Ext: no pedal edema bilaterally Neuro: moving all 4 extremities Psych: dementia    Data Reviewed:  I have personally reviewed following labs    CBC Lab Results  Component Value Date    WBC 4.3 05/05/2023   RBC 3.47 (L) 05/05/2023   HGB 11.0 (L) 05/05/2023   HCT 31.9 (L) 05/05/2023   MCV 91.9 05/05/2023   MCH 31.7 05/05/2023   PLT 96 (L) 05/05/2023   MCHC 34.5 05/05/2023   RDW 15.9 (H) 05/05/2023   LYMPHSABS 0.1 (L) 05/01/2023   MONOABS 0.3 05/01/2023   EOSABS 0.0 05/01/2023   BASOSABS 0.0 05/01/2023     Last metabolic panel Lab Results  Component Value Date   NA 138 05/05/2023   K 3.4 (L) 05/05/2023   CL 104 05/05/2023   CO2 27 05/05/2023   BUN 14 05/05/2023   CREATININE 1.08 05/05/2023   GLUCOSE 107 (H) 05/05/2023   GFRNONAA >60 05/05/2023   GFRAA 61 08/29/2018   CALCIUM 8.1 (L) 05/05/2023   PHOS 2.7 02/01/2023   PROT 6.3 (L) 05/01/2023   ALBUMIN 3.5 05/01/2023   LABGLOB 3.5 08/29/2018   AGRATIO 1.1 (L) 08/29/2018   BILITOT 1.1 05/01/2023   ALKPHOS 65 05/01/2023   AST 26 05/01/2023   ALT 19 05/01/2023   ANIONGAP 7 05/05/2023    CBG (last 3)  Recent Labs    05/05/23 1946 05/06/23 0804 05/06/23 1157  GLUCAP 130* 106* 102*      Coagulation Profile: Recent Labs  Lab 05/01/23 0248  INR 1.1     Radiology Studies: I have personally reviewed the imaging studies  No results found.     Barnetta Chapel M.D. [facility] 05/06/2023, 4:06 PM  Available via Epic secure chat 7am-7pm After 7 pm, please refer to night coverage provider listed on amion.

## 2023-05-06 NOTE — Plan of Care (Signed)
  Problem: Nutrition: Goal: Adequate nutrition will be maintained Outcome: Progressing   Problem: Coping: Goal: Level of anxiety will decrease Outcome: Progressing   Problem: Pain Managment: Goal: General experience of comfort will improve and/or be controlled Outcome: Progressing   Problem: Safety: Goal: Ability to remain free from injury will improve Outcome: Progressing

## 2023-05-07 DIAGNOSIS — J9601 Acute respiratory failure with hypoxia: Secondary | ICD-10-CM | POA: Diagnosis not present

## 2023-05-07 LAB — GLUCOSE, CAPILLARY
Glucose-Capillary: 115 mg/dL — ABNORMAL HIGH (ref 70–99)
Glucose-Capillary: 113 mg/dL — ABNORMAL HIGH (ref 70–99)
Glucose-Capillary: 166 mg/dL — ABNORMAL HIGH (ref 70–99)
Glucose-Capillary: 110 mg/dL — ABNORMAL HIGH (ref 70–99)

## 2023-05-07 NOTE — Progress Notes (Signed)
 [facility]                                                                              Conrad Zajkowski, is a 83 y.o. male, DOB - Aug 09, 1940, [id number] Admit date - 05/01/2023    Outpatient Primary MD for the patient is Mosetta Putt, MD  LOS - 6  days  Chief Complaint  Patient presents with   Altered Mental Status   Fever       Brief summary   Patient is a 83 year old male with multiple myeloma (on daratumumuab and dexamethasone) , Afib (pradaxa, metop & dilt), HTN, OSA on CPAP presented to [facility] ED 3/3 w AMS, associated fever & hypoxia. Was found w gurgling respirations. Intubated in ED as he was obtunded -- this was /w family before hand. Flu A positive. Febrile, c/f sepsis started on empiric antibiotics.  Patient was admitted by pulmonary critical care to ICU.   05/06/2023: Patient seen.  No complaints.  Patient is resting quietly.  Gaseous distention of the abdomen noted.  Will proceed with abdominal x-ray. 05/07/2023: Patient seen.  No new complaints.  Abdominal x-ray is nonrevealing.  Significant Hospital Events:   3/3 ED w AMS fever hypoxia. Intubated. Admit to ICU w Flu A  3/3 off levophed 3/4 extubated 3/6: Transferred to the floor, [facility] assumes care  Assessment & Plan  Acute respiratory failure with hypoxemia (HCC) Aspiration pneumonia, influenza A infection -Extubated on 3/4, currently on room air -Placed on Tamiflu for 5 days -Initially placed on IV Zosyn, Zithromax, now narrowed to Augmentin -PT OT evaluation -SLP evaluation 3/5, recommended regular thin liquids  Septic shock, POA, lactic acidosis -Due to #1, off the vasopressors  -Sepsis physiology has resolved.  Chronic atrial fibrillation with RVR -Was noted to be on amiodarone drip in ICU, discontinued -Resumed Toprol-XL 25 mg daily -Heart rate still not well-controlled, increase Cardizem CD to 180 mg daily -Resumed Pradaxa 05/05/2023: Heart rate is controlled.  Hematuria -Suspected  traumatic from Foley, heparin drip was discontinued -Improved, continue Avodart  Acute metabolic encephalopathy, superimposed on dementia, generalized debility -Still confused, pulling at lines -Aricept, Namenda resumed, improve mobility -[employer]porate investment banker -PT evaluation recommended SNF  CKD stage IIIa -Baseline creatinine, 1.2 -Current creatinine is 1.08 today.  Hyponatremia -Resolved -Last sodium of 138.   Estimated body mass index is 27.53 kg/m as calculated from the following:   Height as of this encounter: 5' 9.02" (1.753 m).   Weight as of this encounter: 84.6 kg.  Code Status: Full code DVT Prophylaxis:  SCDs Start: 05/01/23 0433 dabigatran (PRADAXA) capsule 150 mg   Level of Care: Level of care: Telemetry Medical Family Communication: Updated patient Disposition Plan:      Remains inpatient appropriate:   Likely will need SNF   Procedures:  Intubation, extubation  Consultants:   Admitted by CCM  Antimicrobials:   Anti-infectives (From admission, onward)    Start     Dose/Rate Route Frequency Ordered Stop   05/03/23 1515  amoxicillin-clavulanate (AUGMENTIN) 875-125 MG per tablet 1 tablet        1 tablet Oral Every 12 hours 05/03/23 1415 05/08/23 0959  05/03/23 1000  acyclovir (ZOVIRAX) 200 MG capsule 400 mg        400 mg Oral 2 times daily 05/03/23 0731     05/03/23 1000  azithromycin (ZITHROMAX) tablet 500 mg        500 mg Oral Daily 05/03/23 0731 05/03/23 1007   05/03/23 1000  oseltamivir (TAMIFLU) capsule 30 mg       Placed in "Followed by" Linked Group   30 mg Oral 2 times daily 05/03/23 0731 05/06/23 1025   05/02/23 0300  vancomycin (VANCOREADY) IVPB 1250 mg/250 mL  Status:  Discontinued        1,250 mg 166.7 mL/hr over 90 Minutes Intravenous Every 24 hours 05/01/23 1444 05/02/23 1259   05/01/23 2200  oseltamivir (TAMIFLU) capsule 30 mg  Status:  Discontinued       Placed in "Followed by" Linked Group   30 mg Per Tube 2 times daily 05/01/23 0442  05/01/23 1416   05/01/23 2200  oseltamivir (TAMIFLU) capsule 30 mg  Status:  Discontinued       Placed in "Followed by" Linked Group   30 mg Per Tube 2 times daily 05/01/23 1416 05/03/23 0732   05/01/23 1515  oseltamivir (TAMIFLU) capsule 75 mg       Placed in "Followed by" Linked Group   75 mg Per Tube  Once 05/01/23 1416 05/01/23 1542   05/01/23 1400  piperacillin-tazobactam (ZOSYN) IVPB 3.375 g  Status:  Discontinued        3.375 g 12.5 mL/hr over 240 Minutes Intravenous Every 8 hours 05/01/23 1014 05/01/23 1151   05/01/23 1330  azithromycin (ZITHROMAX) tablet 500 mg  Status:  Discontinued        500 mg Per Tube Daily 05/01/23 1241 05/03/23 0732   05/01/23 1245  piperacillin-tazobactam (ZOSYN) IVPB 3.375 g  Status:  Discontinued        3.375 g 12.5 mL/hr over 240 Minutes Intravenous Every 8 hours 05/01/23 1151 05/03/23 1350   05/01/23 1200  Ampicillin-Sulbactam (UNASYN) 3 g in sodium chloride 0.9 % 100 mL IVPB  Status:  Discontinued        3 g 200 mL/hr over 30 Minutes Intravenous Every 6 hours 05/01/23 0546 05/01/23 1010   05/01/23 1000  oseltamivir (TAMIFLU) capsule 75 mg  Status:  Discontinued        75 mg Oral Daily 05/01/23 0425 05/01/23 0441   05/01/23 0445  oseltamivir (TAMIFLU) capsule 75 mg  Status:  Discontinued       Placed in "Followed by" Linked Group   75 mg Per Tube  Once 05/01/23 0442 05/01/23 1416   05/01/23 0439  acyclovir (ZOVIRAX) 200 MG capsule 400 mg  Status:  Discontinued        400 mg Per Tube 2 times daily 05/01/23 0442 05/03/23 0732   05/01/23 0300  ceFEPIme (MAXIPIME) 2 g in sodium chloride 0.9 % 100 mL IVPB        2 g 200 mL/hr over 30 Minutes Intravenous  Once 05/01/23 0248 05/01/23 0347   05/01/23 0300  metroNIDAZOLE (FLAGYL) IVPB 500 mg        500 mg 100 mL/hr over 60 Minutes Intravenous  Once 05/01/23 0248 05/01/23 0414   05/01/23 0300  vancomycin (VANCOCIN) IVPB 1000 mg/200 mL premix  Status:  Discontinued        1,000 mg 200 mL/hr over 60 Minutes  Intravenous  Once 05/01/23 0248 05/01/23 0252   05/01/23 0300  vancomycin (VANCOREADY) IVPB 1500 mg/300 mL  1,500 mg 150 mL/hr over 120 Minutes Intravenous  Once 05/01/23 0252 05/01/23 0606          Medications  acyclovir  400 mg Oral BID   amoxicillin-clavulanate  1 tablet Oral Q12H   arformoterol  15 mcg Nebulization BID   budesonide (PULMICORT) nebulizer solution  0.5 mg Nebulization BID   calcium citrate  400 mg of elemental calcium Oral Daily   Chlorhexidine Gluconate Cloth  6 each Topical Daily   dabigatran  150 mg Oral Q12H   diltiazem  180 mg Oral Daily   docusate sodium  100 mg Oral BID   donepezil  10 mg Oral QHS   dutasteride  0.5 mg Oral Daily   insulin aspart  0-5 Units Subcutaneous QHS   insulin aspart  0-9 Units Subcutaneous TID WC   memantine  10 mg Oral Daily   metoprolol succinate  25 mg Oral Daily   montelukast  10 mg Oral QHS   pantoprazole  40 mg Oral QHS   polyethylene glycol  17 g Oral Daily   pravastatin  20 mg Oral q1800   tadalafil  5 mg Oral Daily      Subjective:  No history from patient.  Objective:   Vitals:   05/06/23 2252 05/07/23 0352 05/07/23 0500 05/07/23 0947  BP:  (!) 153/99  119/81  Pulse: 80 79  63  Resp:  18    Temp:  98.1 F (36.7 C)    TempSrc:  Oral    SpO2:      Weight:   84.6 kg   Height:        Intake/Output Summary (Last 24 hours) at 05/07/2023 1634 Last data filed at 05/07/2023 0755 Gross per 24 hour  Intake --  Output 2200 ml  Net -2200 ml     Wt Readings from Last 3 Encounters:  05/07/23 84.6 kg  02/03/23 80.3 kg  10/28/22 81.1 kg     Exam General: Alert and oriented to self, NAD Cardiovascular: Irregularly regular Respiratory: Scattered coarse rhonchi Gastrointestinal: Gaseous distention.  Soft and nontender.  Hyperactive bowel sounds.   Ext: no pedal edema bilaterally Neuro: moving all 4 extremities Psych: dementia    Data Reviewed:  I have personally reviewed following labs     CBC Lab Results  Component Value Date   WBC 4.3 05/05/2023   RBC 3.47 (L) 05/05/2023   HGB 11.0 (L) 05/05/2023   HCT 31.9 (L) 05/05/2023   MCV 91.9 05/05/2023   MCH 31.7 05/05/2023   PLT 96 (L) 05/05/2023   MCHC 34.5 05/05/2023   RDW 15.9 (H) 05/05/2023   LYMPHSABS 0.1 (L) 05/01/2023   MONOABS 0.3 05/01/2023   EOSABS 0.0 05/01/2023   BASOSABS 0.0 05/01/2023     Last metabolic panel Lab Results  Component Value Date   NA 138 05/05/2023   K 3.4 (L) 05/05/2023   CL 104 05/05/2023   CO2 27 05/05/2023   BUN 14 05/05/2023   CREATININE 1.08 05/05/2023   GLUCOSE 107 (H) 05/05/2023   GFRNONAA >60 05/05/2023   GFRAA 61 08/29/2018   CALCIUM 8.1 (L) 05/05/2023   PHOS 2.7 02/01/2023   PROT 6.3 (L) 05/01/2023   ALBUMIN 3.5 05/01/2023   LABGLOB 3.5 08/29/2018   AGRATIO 1.1 (L) 08/29/2018   BILITOT 1.1 05/01/2023   ALKPHOS 65 05/01/2023   AST 26 05/01/2023   ALT 19 05/01/2023   ANIONGAP 7 05/05/2023    CBG (last 3)  Recent Labs    05/06/23  2316 05/07/23 0752 05/07/23 1239  GLUCAP 114* 115* 113*      Coagulation Profile: Recent Labs  Lab 05/01/23 0248  INR 1.1     Radiology Studies: I have personally reviewed the imaging studies  DG Abd 1 View Result Date: 05/06/2023 CLINICAL DATA:  Abdominal distension. EXAM: ABDOMEN - 1 VIEW COMPARISON:  May 01, 2023. FINDINGS: The bowel gas pattern is normal. No radio-opaque calculi or other significant radiographic abnormality are seen. IMPRESSION: Negative. Electronically Signed   By: Lupita Raider M.D.   On: 05/06/2023 16:56       Barnetta Chapel M.D. [facility] 05/07/2023, 4:34 PM  Available via Epic secure chat 7am-7pm After 7 pm, please refer to night coverage provider listed on amion.

## 2023-05-07 NOTE — Plan of Care (Signed)
  Problem: Nutritional: Goal: Maintenance of adequate nutrition will improve Outcome: Progressing   Problem: Respiratory: Goal: Ability to maintain a clear airway and adequate ventilation will improve Outcome: Progressing   Problem: Activity: Goal: Risk for activity intolerance will decrease Outcome: Progressing   Problem: Nutrition: Goal: Adequate nutrition will be maintained Outcome: Progressing   Problem: Elimination: Goal: Will not experience complications related to bowel motility Outcome: Progressing   Problem: Pain Managment: Goal: General experience of comfort will improve and/or be controlled Outcome: Progressing   Problem: Safety: Goal: Ability to remain free from injury will improve Outcome: Progressing

## 2023-05-07 NOTE — Plan of Care (Signed)
  Problem: Coping: Goal: Ability to adjust to condition or change in health will improve Outcome: Progressing   Problem: Fluid Volume: Goal: Ability to maintain a balanced intake and output will improve Outcome: Progressing   Problem: Education: Goal: Ability to describe self-care measures that may prevent or decrease complications (Diabetes Survival Skills Education) will improve Outcome: Not Progressing   Problem: Health Behavior/Discharge Planning: Goal: Ability to manage health-related needs will improve Outcome: Not Progressing

## 2023-05-08 DIAGNOSIS — J9601 Acute respiratory failure with hypoxia: Secondary | ICD-10-CM | POA: Diagnosis not present

## 2023-05-08 LAB — GLUCOSE, CAPILLARY
Glucose-Capillary: 171 mg/dL — ABNORMAL HIGH (ref 70–99)
Glucose-Capillary: 100 mg/dL — ABNORMAL HIGH (ref 70–99)
Glucose-Capillary: 125 mg/dL — ABNORMAL HIGH (ref 70–99)
Glucose-Capillary: 151 mg/dL — ABNORMAL HIGH (ref 70–99)

## 2023-05-08 NOTE — Progress Notes (Signed)
 Physical Therapy Treatment Patient Details Name: [name]. MRN: [id number] DOB: April 16, 1940 Today's Date: 05/08/2023   History of Present Illness Pt is an 83 y/o M presenting to ED on 3/3 with AMS, associated fever and hypoxia, intubated in ED and extubated 3/4. Found to have Flu A, sepsis, Admitted for acute respiratory failure with hypoxia. PMH includes multiple myeloma, A fib, HTN.    PT Comments  Pt received in recliner, caregiver present, pt agreeable to therapy session and expressed eagerness to attempt BM on BSC. Pt needing up to modA (+2 safety) for sit<>stand from lower chair height and +1 modA for sit<>stand from elevated BSC and to sit on EOB. Pt with poor safety awareness and benefits from simple 1-step commands and Errorless Learning strategy due to decreased short term memory and cognitive deficit. Disposition updated to reflect pt/family wishes now that pt mobilizing with +1 assist, continue to recommend hoyer lift for DC home for pt/family safety as pt still not able to ambulate typical household distances and has needed variable +1-2 mod/maxA for transfers past couple of days and remains a high fall risk. Recommendation updated per discussion with pt, daughter Summer, and supervising PT Ryan L.   If plan is discharge home, recommend the following: A lot of help with walking and/or transfers;A lot of help with bathing/dressing/bathroom;Assistance with cooking/housework;Assist for transportation;Supervision due to cognitive status;Direct supervision/assist for medications management;Direct supervision/assist for financial management   Can travel by private vehicle     Yes  Equipment Recommendations  Hoyer lift    Recommendations for Other Services       Precautions / Restrictions Precautions Precautions: Fall Recall of Precautions/Restrictions: Impaired Precaution/Restrictions Comments: Droplet; watch HR and SpO2 Restrictions Weight Bearing Restrictions Per Provider  Order: No     Mobility  Bed Mobility Overal bed mobility: Needs Assistance Bed Mobility: Sit to Sidelying, Rolling Rolling: Min assist       Sit to sidelying: Min assist, Used rails General bed mobility comments: Cues for sequencing/safety, log roll for comfort/pt safety returning to supine.    Transfers Overall transfer level: Needs assistance Equipment used: Rolling walker (2 wheels) Transfers: Sit to/from Stand Sit to Stand: Mod assist, +2 safety/equipment   Step pivot transfers: Mod assist, Min assist, From elevated surface       General transfer comment: from chair>RW with modA (+2) then short distance pivot to [facility] for BM. Pt then took steps back to bed and foward/backward in front of bed to build BLE strength.    Ambulation/Gait Ambulation/Gait assistance: Mod assist Gait Distance (Feet): 8 Feet Assistive device: Rolling walker (2 wheels) Gait Pattern/deviations: Step-to pattern, Trunk flexed, Narrow base of support, Decreased dorsiflexion - right, Decreased dorsiflexion - left, Leaning posteriorly Gait velocity: decreased; limited due to pt fatigue     General Gait Details: stiff posture and poor RW proximity/management, pt needing up to modA for stability and RW management; ~45ft pivotal steps from chair>BSC, then ~7ft from [facility] to EOB with some forward/backward and some lateral stepping   Stairs             Wheelchair Mobility     Tilt Bed    Modified Rankin (Stroke Patients Only)       Balance Overall balance assessment: Needs assistance Sitting-balance support: Feet supported Sitting balance-Leahy Scale: Fair   Postural control: Posterior lean Standing balance support: Reliant on assistive device for balance, Bilateral upper extremity supported, During functional activity Standing balance-Leahy Scale: Poor Standing balance comment: modA +1-2 for  safety with standing tasks using RW                            Communication  Communication Communication: No apparent difficulties  Cognition Arousal: Alert Behavior During Therapy: WFL for tasks assessed/performed   PT - Cognitive impairments: Orientation, Awareness, Memory, Sequencing, Problem solving, Safety/Judgement, Attention, Initiation   Orientation impairments: Place, Time, Situation                   PT - Cognition Comments: Dementia at baseline, pt following commands somewhat better today but still needs dense safety cues; less stiff/guarding today. Poor RW management and safety. Following commands: Intact      Cueing Cueing Techniques: Verbal cues, Gestural cues  Exercises Other Exercises Other Exercises: Encouraged use of IS (in room) caregiver agreeable to assist him with this.    General Comments General comments (skin integrity, edema, etc.): Discussed pressure relief (heels floated), use of SCD, benefits of hoyer to protect pt and caregivers on days when he is too fatigued to safely stand with assist or if he needs to get to chair in case of emergency and can't get up with +1, hoyer will allow family to get him OOB more safely. HR/SpO2 WFL on RA      Pertinent Vitals/Pain Pain Assessment Pain Assessment: PAINAD Breathing: occasional labored breathing, short period of hyperventilation Negative Vocalization: none Facial Expression: smiling or inexpressive Body Language: tense, distressed pacing, fidgeting Consolability: distracted or reassured by voice/touch PAINAD Score: 3 Pain Intervention(s): Limited activity within patient's tolerance, Monitored during session, Repositioned (heels floated)    Home Living                          Prior Function            PT Goals (current goals can now be found in the care plan section) Acute Rehab PT Goals Patient Stated Goal: not stated PT Goal Formulation: Patient unable to participate in goal setting Time For Goal Achievement: 05/17/23 Progress towards PT goals: Progressing  toward goals    Frequency    Min 2X/week      PT Plan      Co-evaluation              AM-PAC PT "6 Clicks" Mobility   Outcome Measure  Help needed turning from your back to your side while in a flat bed without using bedrails?: A Lot (without bed rails) Help needed moving from lying on your back to sitting on the side of a flat bed without using bedrails?: A Lot Help needed moving to and from a bed to a chair (including a wheelchair)?: A Lot Help needed standing up from a chair using your arms (e.g., wheelchair or bedside chair)?: A Lot Help needed to walk in hospital room?: Total (<34ft) Help needed climbing 3-5 steps with a railing? : Total 6 Click Score: 10    End of Session Equipment Utilized During Treatment: Gait belt Activity Tolerance: Patient tolerated treatment well;Patient limited by fatigue Patient left: in bed;with call bell/phone within reach;with bed alarm set;Other (comment);with SCD's reapplied;with family/visitor present (HOB elevated, unable to achieve full chair posture due to end of bed broken, unit secretary called to order a new bed; heels floated; hired caregiver present) Nurse Communication: Mobility status;Other (comment) (need new bed, urine canister full) PT Visit Diagnosis: Other abnormalities of gait and mobility (R26.89);Muscle weakness (generalized) (M62.81)  Time: 1610-9604 PT Time Calculation (min) (ACUTE ONLY): 26 min  Charges:    $Gait Training: 8-22 mins $Therapeutic Activity: 8-22 mins PT General Charges $$ ACUTE PT VISIT: 1 Visit                     Shantae Vantol P., PTA Acute Rehabilitation Services Secure Chat Preferred 9a-5:30pm Office: [phone]    Dorathy Kinsman [facility] 05/08/2023, 5:04 PM

## 2023-05-08 NOTE — Plan of Care (Signed)
  Problem: Health Behavior/Discharge Planning: Goal: Ability to manage health-related needs will improve Outcome: Progressing   Problem: Activity: Goal: Ability to tolerate increased activity will improve Outcome: Progressing   Problem: Respiratory: Goal: Ability to maintain a clear airway and adequate ventilation will improve Outcome: Progressing   Problem: Education: Goal: Knowledge of General Education information will improve Description: Including pain rating scale, medication(s)/side effects and non-pharmacologic comfort measures Outcome: Progressing

## 2023-05-08 NOTE — Progress Notes (Signed)
 [facility]                                                                              Danthony Kendrix, is a 83 y.o. male, DOB - 03/01/1940, NWG:[id number] Admit date - 05/01/2023    Outpatient Primary MD for the patient is Mosetta Putt, MD  LOS - 7  days  Chief Complaint  Patient presents with   Altered Mental Status   Fever       Brief summary   Patient is a 83 year old male with multiple myeloma (on daratumumuab and dexamethasone) , Afib (pradaxa, metop & dilt), HTN, OSA on CPAP presented to [facility] ED 3/3 w AMS, associated fever & hypoxia. Was found w gurgling respirations. Intubated in ED as he was obtunded -- this was /w family before hand. Flu A positive. Febrile, c/f sepsis started on empiric antibiotics.  Patient was admitted by pulmonary critical care to ICU.   05/06/2023: Patient seen.  No complaints.  Patient is resting quietly.  Gaseous distention of the abdomen noted.  Will proceed with abdominal x-ray. 05/07/2023: Patient seen.  No new complaints.  Abdominal x-ray is nonrevealing. 05/08/2023: Patient seen.  Patient continues to improve.  Pursue disposition.  Significant Hospital Events:   3/3 ED w AMS fever hypoxia. Intubated. Admit to ICU w Flu A  3/3 off levophed 3/4 extubated 3/6: Transferred to the floor, [facility] assumes care  Assessment & Plan  Acute respiratory failure with hypoxemia (HCC) Aspiration pneumonia, influenza A infection -Extubated on 3/4, currently on room air -Placed on Tamiflu for 5 days -Initially placed on IV Zosyn, Zithromax, now narrowed to Augmentin -PT OT evaluation -SLP evaluation 3/5, recommended regular thin liquids  Septic shock, POA, lactic acidosis -Due to #1, off the vasopressors  -Sepsis physiology has resolved.  Chronic atrial fibrillation with RVR -Was noted to be on amiodarone drip in ICU, discontinued -Resumed Toprol-XL 25 mg daily -Heart rate still not well-controlled, increase Cardizem CD to 180 mg  daily -Resumed Pradaxa 05/05/2023: Heart rate is controlled.  Hematuria -Suspected traumatic from F[place], heparin drip was discontinued -Improved, continue Avodart  Acute metabolic encephalopathy, superimposed on dementia, generalized debility -Still confused, pulling at lines -Aricept, Namenda resumed, improve mobility -Corporate investment banker -PT evaluation recommended SNF  CKD stage IIIa -Baseline creatinine, 1.2 -Current creatinine is 1.08 today.  Hyponatremia -Resolved -Last sodium of 138.   Estimated body mass index is 27.73 kg/m as calculated from the following:   Height as of this encounter: 5' 9.02" (1.753 m).   Weight as of this encounter: 85.2 kg.  Code Status: Full code DVT Prophylaxis:  SCDs Start: 05/01/23 0433 dabigatran (PRADAXA) capsule 150 mg   Level of Care: Level of care: Telemetry Medical Family Communication: Updated patient Disposition Plan:      Remains inpatient appropriate:   Likely will need SNF   Procedures:  Intubation, extubation  Consultants:   Admitted by CCM  Antimicrobials:   Anti-infectives (From admission, onward)    Start     Dose/Rate Route Frequency Ordered Stop   05/03/23 1515  amoxicillin-clavulanate (AUGMENTIN) 875-125 MG per tablet 1 tablet  1 tablet Oral Every 12 hours 05/03/23 1415 05/07/23 2044   05/03/23 1000  acyclovir (ZOVIRAX) 200 MG capsule 400 mg        400 mg Oral 2 times daily 05/03/23 0731     05/03/23 1000  azithromycin (ZITHROMAX) tablet 500 mg        500 mg Oral Daily 05/03/23 0731 05/03/23 1007   05/03/23 1000  oseltamivir (TAMIFLU) capsule 30 mg       Placed in "Followed by" Linked Group   30 mg Oral 2 times daily 05/03/23 0731 05/06/23 1025   05/02/23 0300  vancomycin (VANCOREADY) IVPB 1250 mg/250 mL  Status:  Discontinued        1,250 mg 166.7 mL/hr over 90 Minutes Intravenous Every 24 hours 05/01/23 1444 05/02/23 1259   05/01/23 2200  oseltamivir (TAMIFLU) capsule 30 mg  Status:  Discontinued        Placed in "Followed by" Linked Group   30 mg Per Tube 2 times daily 05/01/23 0442 05/01/23 1416   05/01/23 2200  oseltamivir (TAMIFLU) capsule 30 mg  Status:  Discontinued       Placed in "Followed by" Linked Group   30 mg Per Tube 2 times daily 05/01/23 1416 05/03/23 0732   05/01/23 1515  oseltamivir (TAMIFLU) capsule 75 mg       Placed in "Followed by" Linked Group   75 mg Per Tube  Once 05/01/23 1416 05/01/23 1542   05/01/23 1400  piperacillin-tazobactam (ZOSYN) IVPB 3.375 g  Status:  Discontinued        3.375 g 12.5 mL/hr over 240 Minutes Intravenous Every 8 hours 05/01/23 1014 05/01/23 1151   05/01/23 1330  azithromycin (ZITHROMAX) tablet 500 mg  Status:  Discontinued        500 mg Per Tube Daily 05/01/23 1241 05/03/23 0732   05/01/23 1245  piperacillin-tazobactam (ZOSYN) IVPB 3.375 g  Status:  Discontinued        3.375 g 12.5 mL/hr over 240 Minutes Intravenous Every 8 hours 05/01/23 1151 05/03/23 1350   05/01/23 1200  Ampicillin-Sulbactam (UNASYN) 3 g in sodium chloride 0.9 % 100 mL IVPB  Status:  Discontinued        3 g 200 mL/hr over 30 Minutes Intravenous Every 6 hours 05/01/23 0546 05/01/23 1010   05/01/23 1000  oseltamivir (TAMIFLU) capsule 75 mg  Status:  Discontinued        75 mg Oral Daily 05/01/23 0425 05/01/23 0441   05/01/23 0445  oseltamivir (TAMIFLU) capsule 75 mg  Status:  Discontinued       Placed in "Followed by" Linked Group   75 mg Per Tube  Once 05/01/23 0442 05/01/23 1416   05/01/23 0439  acyclovir (ZOVIRAX) 200 MG capsule 400 mg  Status:  Discontinued        400 mg Per Tube 2 times daily 05/01/23 0442 05/03/23 0732   05/01/23 0300  ceFEPIme (MAXIPIME) 2 g in sodium chloride 0.9 % 100 mL IVPB        2 g 200 mL/hr over 30 Minutes Intravenous  Once 05/01/23 0248 05/01/23 0347   05/01/23 0300  metroNIDAZOLE (FLAGYL) IVPB 500 mg        500 mg 100 mL/hr over 60 Minutes Intravenous  Once 05/01/23 0248 05/01/23 0414   05/01/23 0300  vancomycin (VANCOCIN) IVPB 1000  mg/200 mL premix  Status:  Discontinued        1,000 mg 200 mL/hr over 60 Minutes Intravenous  Once 05/01/23 0248 05/01/23  1610   05/01/23 0300  vancomycin (VANCOREADY) IVPB 1500 mg/300 mL        1,500 mg 150 mL/hr over 120 Minutes Intravenous  Once 05/01/23 0252 05/01/23 0606          Medications  acyclovir  400 mg Oral BID   arformoterol  15 mcg Nebulization BID   budesonide (PULMICORT) nebulizer solution  0.5 mg Nebulization BID   calcium citrate  400 mg of elemental calcium Oral Daily   Chlorhexidine Gluconate Cloth  6 each Topical Daily   dabigatran  150 mg Oral Q12H   diltiazem  180 mg Oral Daily   docusate sodium  100 mg Oral BID   donepezil  10 mg Oral QHS   dutasteride  0.5 mg Oral Daily   insulin aspart  0-5 Units Subcutaneous QHS   insulin aspart  0-9 Units Subcutaneous TID WC   memantine  10 mg Oral Daily   metoprolol succinate  25 mg Oral Daily   montelukast  10 mg Oral QHS   pantoprazole  40 mg Oral QHS   polyethylene glycol  17 g Oral Daily   pravastatin  20 mg Oral q1800   tadalafil  5 mg Oral Daily      Subjective:  No history from patient.  Objective:   Vitals:   05/08/23 0500 05/08/23 0711 05/08/23 0749 05/08/23 1538  BP:  (!) 168/90  (!) 151/77  Pulse:  88  94  Resp:  16  16  Temp:  97.8 F (36.6 C)  97.6 F (36.4 C)  TempSrc:  Oral  Oral  SpO2:  100% 94% 93%  Weight: 85.2 kg     Height:       No intake or output data in the 24 hours ending 05/08/23 2004    Wt Readings from Last 3 Encounters:  05/08/23 85.2 kg  02/03/23 80.3 kg  10/28/22 81.1 kg     Exam General: Alert and oriented to self, NAD Cardiovascular: Irregularly regular Respiratory: Scattered coarse rhonchi Gastrointestinal: Gaseous distention.  Soft and nontender.  Hyperactive bowel sounds.   Ext: no pedal edema bilaterally Neuro: moving all 4 extremities Psych: dementia    Data Reviewed:  I have personally reviewed following labs    CBC Lab Results   Component Value Date   WBC 4.3 05/05/2023   RBC 3.47 (L) 05/05/2023   HGB 11.0 (L) 05/05/2023   HCT 31.9 (L) 05/05/2023   MCV 91.9 05/05/2023   MCH 31.7 05/05/2023   PLT 96 (L) 05/05/2023   MCHC 34.5 05/05/2023   RDW 15.9 (H) 05/05/2023   LYMPHSABS 0.1 (L) 05/01/2023   MONOABS 0.3 05/01/2023   EOSABS 0.0 05/01/2023   BASOSABS 0.0 05/01/2023     Last metabolic panel Lab Results  Component Value Date   NA 138 05/05/2023   K 3.4 (L) 05/05/2023   CL 104 05/05/2023   CO2 27 05/05/2023   BUN 14 05/05/2023   CREATININE 1.08 05/05/2023   GLUCOSE 107 (H) 05/05/2023   GFRNONAA >60 05/05/2023   GFRAA 61 08/29/2018   CALCIUM 8.1 (L) 05/05/2023   PHOS 2.7 02/01/2023   PROT 6.3 (L) 05/01/2023   ALBUMIN 3.5 05/01/2023   LABGLOB 3.5 08/29/2018   AGRATIO 1.1 (L) 08/29/2018   BILITOT 1.1 05/01/2023   ALKPHOS 65 05/01/2023   AST 26 05/01/2023   ALT 19 05/01/2023   ANIONGAP 7 05/05/2023    CBG (last 3)  Recent Labs    05/08/23 0823 05/08/23 1201 05/08/23  1652  GLUCAP 171* 100* 125*      Coagulation Profile: No results for input(s): "INR", "PROTIME" in the last 168 hours.    Radiology Studies: I have personally reviewed the imaging studies  No results found.      Barnetta Chapel M.D. [facility] 05/08/2023, 8:04 PM  Available via Epic secure chat 7am-7pm After 7 pm, please refer to night coverage provider listed on amion.

## 2023-05-08 NOTE — TOC Progression Note (Addendum)
 Transition of Care (TOC) - Progression Note   PT recommending hospital bed and hoyer lift .   NCM called patient's daughter [name] [phone] and left voicemail await call back.   [name] returned call. She would like PTA to call her and discuss the equipment he already has and the recommended hospital bed and hoyer and transportation home.  Secure chatted PTA daughter's information  [name] [phone] she will be available after 3 pm today.   Patient Details  Name: [name]. MRN: [id number] Date of Birth: December 16, 1940  Transition of Care [facility]) CM/SW Contact  Pocahontas Cohenour, Adria Devon, RN Phone Number: 05/08/2023, 1:49 PM  Clinical Narrative:         Barriers to Discharge: Continued Medical Work up  Expected Discharge Plan and Services       Living arrangements for the past 2 months: Single Family Home                                       Social Determinants of Health (SDOH) Interventions SDOH Screenings   Food Insecurity: No Food Insecurity (05/01/2023)  Housing: Low Risk  (05/01/2023)  Transportation Needs: No Transportation Needs (05/01/2023)  Utilities: Not At Risk (05/01/2023)  Social Connections: Patient Unable To Answer (05/01/2023)  Tobacco Use: Medium Risk (05/01/2023)    Readmission Risk Interventions     No data to display

## 2023-05-09 ENCOUNTER — Telehealth: Payer: Self-pay | Admitting: Neurology

## 2023-05-09 DIAGNOSIS — J9601 Acute respiratory failure with hypoxia: Secondary | ICD-10-CM | POA: Diagnosis not present

## 2023-05-09 LAB — GLUCOSE, CAPILLARY
Glucose-Capillary: 173 mg/dL — ABNORMAL HIGH (ref 70–99)
Glucose-Capillary: 118 mg/dL — ABNORMAL HIGH (ref 70–99)

## 2023-05-09 MED ORDER — PHENOL 1.4 % MT LIQD: 1.0000 | OROMUCOSAL | Status: AC | PRN

## 2023-05-09 MED ORDER — DILTIAZEM HCL ER COATED BEADS 180 MG PO CP24: 180.0000 mg | ORAL_CAPSULE | Freq: Every day | ORAL | 1 refills | Status: AC

## 2023-05-09 MED ORDER — DOCUSATE SODIUM 100 MG PO CAPS: 100.0000 mg | ORAL_CAPSULE | Freq: Two times a day (BID) | ORAL | 0 refills | Status: AC

## 2023-05-09 MED ORDER — ACETAMINOPHEN 325 MG PO TABS: 650.0000 mg | ORAL_TABLET | ORAL | Status: AC | PRN

## 2023-05-09 MED ORDER — POTASSIUM CHLORIDE ER 10 MEQ PO CPCR: 20.0000 meq | ORAL_CAPSULE | Freq: Every day | ORAL | 0 refills | Status: AC

## 2023-05-09 MED ORDER — POLYETHYLENE GLYCOL 3350 17 G PO PACK: 17.0000 g | PACK | Freq: Every day | ORAL | 0 refills | Status: AC

## 2023-05-09 MED ORDER — MELATONIN 3 MG PO TABS
3.0000 mg | ORAL_TABLET | Freq: Every evening | ORAL | 0 refills | Status: AC | PRN
Start: 2023-05-09 — End: ?

## 2023-05-09 NOTE — Discharge Summary (Signed)
 Physician Discharge Summary  Patient ID: [name]. MRN: [id number] DOB/AGE: [birth year] 83 y.o.  Admit date: 05/01/2023 Discharge date: 05/09/2023  Admission Diagnoses:  Discharge Diagnoses:  Principal Problem:   Acute respiratory failure with hypoxemia [facility]) Active Problems:   On mechanically assisted ventilation (HCC)   Influenza A   Acute respiratory failure with hypoxia (HCC)   Aspiration pneumonia of both lungs [facility])   Physical deconditioning   Discharged Condition: stable  Hospital Course:  Patient is an 83 year old male with multiple myeloma (on daratumumuab and dexamethasone), atrial fibrillation, hypertension and OSA on CPAP.  Patient was admitted altered mental status, with associated fever & hypoxia.  Patient was found to have gurgling respirations on presentation.  Patient was intubated at the Emergency Department, as the patient was obtunded. Flu A was positive.  Patient was admitted with sepsis to ICU by pulmonary and critical care team.    Acute respiratory failure with hypoxemia (HCC) Aspiration pneumonia, influenza A infection -Extubated on 05/02/2023. -Treated with Tamiflu. -Initially placed on IV Zosyn, Zithromax, and eventually narrowed to Augmentin -SLP evaluation 3/5, recommended regular thin liquids   Septic shock, POA, lactic acidosis  -Sepsis physiology has resolved.   Chronic atrial fibrillation with RVR -Patient was initially managed with amiodarone drip in ICU. -Resumed Toprol-XL 25 mg daily. -Resumed Pradaxa 05/05/2023: Heart rate is controlled.   Hematuria -Suspected traumatic from [place], heparin drip was discontinued -Improved -Continue Avodart   Acute metabolic encephalopathy, superimposed on dementia, generalized debility -Aricept, Namenda resumed, improve mobility -PT evaluation recommended SNF   CKD stage IIIa -Baseline creatinine, 1.2 -Last creatinine - 1.08 today.   Hyponatremia -Resolved -Last sodium - 138.  Consults:  None  Significant Diagnostic Studies:   Treatments:   Discharge Exam: Blood pressure (!) 154/85, pulse 91, temperature 97.6 F (36.4 C), temperature source Oral, resp. rate 17, height 5' 9.02" (1.753 m), weight 47 kg, SpO2 98%.   Disposition: Discharge disposition: 06-Home-Health Care Svc       Discharge Instructions     Diet - low sodium heart healthy   Complete by: As directed    Increase activity slowly   Complete by: As directed       Allergies as of 05/09/2023       Reactions   Proair Hfa [albuterol] Palpitations   Only use in emergency due to has heart problems        Medication List     STOP taking these medications    alfuzosin 10 MG 24 hr tablet Commonly known as: UROXATRAL   benzonatate 100 MG capsule Commonly known as: TESSALON   chlorthalidone 25 MG tablet Commonly known as: HYGROTON       TAKE these medications    ACE Aerosol Cisco Inhale into the lungs See admin instructions.   acetaminophen 325 MG tablet Commonly known as: TYLENOL Place 2 tablets (650 mg total) into feeding tube every 4 (four) hours as needed for mild pain (pain score 1-3) (temp > 101.5).   acyclovir 400 MG tablet Commonly known as: ZOVIRAX Take 400 mg by mouth 2 (two) times daily.   AMBULATORY NON FORMULARY MEDICATION CPAP at night   calcium citrate-vitamin D 315-200 MG-UNIT tablet Commonly known as: CITRACAL+D Take 2 tablets by mouth daily.   DARATUMUMAB IV Inject 1 Dose into the skin every 30 (thirty) days.   diltiazem 180 MG 24 hr capsule Commonly known as: CARDIZEM CD Take 1 capsule (180 mg total) by mouth daily. Start taking on: May 10, 2023 What changed:  medication strength how much to take when to take this   docusate sodium 100 MG capsule Commonly known as: COLACE Take 1 capsule (100 mg total) by mouth 2 (two) times daily.   donepezil 10 MG tablet Commonly known as: ARICEPT Take 10 mg by mouth at bedtime.   dutasteride  0.5 MG capsule Commonly known as: AVODART Take 0.5 mg by mouth daily.   fluticasone-salmeterol 250-50 MCG/ACT Aepb Commonly known as: ADVAIR Inhale 1 puff into the lungs 2 (two) times daily.   melatonin 3 MG Tabs tablet Take 1 tablet (3 mg total) by mouth at bedtime as needed. What changed:  medication strength how much to take when to take this reasons to take this   memantine 10 MG tablet Commonly known as: NAMENDA Take 10 mg by mouth 2 (two) times daily.   metoprolol succinate 25 MG 24 hr tablet Commonly known as: TOPROL-XL Take 25 mg by mouth every morning.   montelukast 10 MG tablet Commonly known as: SINGULAIR TAKE 1 TABLET(10 MG) BY MOUTH AT BEDTIME What changed: See the new instructions.   multivitamin with minerals tablet Take 1 tablet by mouth daily.   omeprazole 20 MG capsule Commonly known as: PRILOSEC TAKE ONE CAPSULE BY MOUTH EVERY DAY   phenol 1.4 % Liqd Commonly known as: CHLORASEPTIC Use as directed 1 spray in the mouth or throat as needed for throat irritation / pain.   polyethylene glycol 17 g packet Commonly known as: MIRALAX / GLYCOLAX Take 17 g by mouth daily. Start taking on: May 10, 2023   potassium chloride 10 MEQ CR capsule Commonly known as: MICRO-K Take 2 capsules (20 mEq total) by mouth daily for 7 days.   Pradaxa 150 MG Caps capsule Generic drug: dabigatran TAKE ONE CAPSULE BY MOUTH TWICE DAILY   pravastatin 20 MG tablet Commonly known as: PRAVACHOL Take 20 mg daily by mouth.   tadalafil 5 MG tablet Commonly known as: CIALIS Take 1 tablet by mouth daily.   zoledronic acid 4 MG/5ML injection Commonly known as: ZOMETA Inject 4 mg into the vein once. Yearly               Durable Medical Equipment  (From admission, onward)           Start     Ordered   05/09/23 0818  For home use only DME Other see comment  Once       Comments: hoyer  Question:  Length of Need  Answer:  Lifetime   05/09/23 0817             Time spent: 35 Minutes.   SignedBarnetta Chapel 05/09/2023, 2:58 PM

## 2023-05-09 NOTE — TOC Progression Note (Signed)
 Transition of Care (TOC) - Progression Note   Spoke to daughter via phone. Summer does want hoyer lift for home. Ordered with Zach with [employer]. Contact person for delivery is Delton [place] [phone]    Anticipated discharge is today. NCM offered ambulance transport home however Summer feels her and caregiver Alvis Lemmings can transport him home. She is requesting a time that patient will be ready. NCM secure chatted nurse and MD and asked them to call her with a time.    Eber Jones with [facility] aware discharge is today  Patient Details  Name: [name]. MRN: [id number] Date of Birth: 17-Aug-1940  Transition of Care [facility]) CM/SW Contact  Posey Jasmin, Adria Devon, RN Phone Number: 05/09/2023, 11:10 AM  Clinical Narrative:         Barriers to Discharge: Continued Medical Work up  Expected Discharge Plan and Services       Living arrangements for the past 2 months: Single Family Home                                       Social Determinants of Health (SDOH) Interventions SDOH Screenings   Food Insecurity: No Food Insecurity (05/01/2023)  Housing: Low Risk  (05/01/2023)  Transportation Needs: No Transportation Needs (05/01/2023)  Utilities: Not At Risk (05/01/2023)  Social Connections: Patient Unable To Answer (05/01/2023)  Tobacco Use: Medium Risk (05/01/2023)    Readmission Risk Interventions     No data to display

## 2023-05-09 NOTE — Telephone Encounter (Signed)
 P's wife cancelled appt due to patient has already had nerve conduction done. PCP help with getting completed.

## 2023-05-09 NOTE — Progress Notes (Signed)
 Occupational Therapy Treatment Patient Details Name: [name]. MRN: [id number] DOB: 1940-10-22 Today's Date: 05/09/2023   History of present illness Pt is an 83 y/o M presenting to ED on 3/3 with AMS, associated fever and hypoxia, intubated in ED and extubated 3/4. Found to have Flu A, sepsis, Admitted for acute respiratory failure with hypoxia. PMH includes multiple myeloma, A fib, HTN.   OT comments  Pt's functional levels fluctuated throughout session. Mod assist +2 for transfers and bed mobility. Caregiver present to provide PLOF and home set up. Pt is progressing well towards goals, but is limited by weakness, cog, and balance. Recommendation of <3 hours of skilled rehab daily to optimize independence levels. OT will continue to follow acutely to maximize independence.       If plan is discharge home, recommend the following:  A lot of help with walking and/or transfers;A lot of help with bathing/dressing/bathroom;Assistance with cooking/housework;Direct supervision/assist for medications management;Direct supervision/assist for financial management;Assist for transportation;Help with stairs or ramp for entrance   Equipment Recommendations  Other (comment) (Defer to next venue)    Recommendations for Other Services      Precautions / Restrictions Precautions Precautions: Fall Recall of Precautions/Restrictions: Impaired Precaution/Restrictions Comments: Droplet; watch HR and SpO2 Restrictions Weight Bearing Restrictions Per Provider Order: No       Mobility Bed Mobility Overal bed mobility: Needs Assistance Bed Mobility: Supine to Sit     Supine to sit: Mod assist, +2 for physical assistance, HOB elevated, Used rails     General bed mobility comments: Cues for sequencing    Transfers Overall transfer level: Needs assistance Equipment used: Rolling walker (2 wheels) Transfers: Sit to/from Stand Sit to Stand: Mod assist, +2 safety/equipment, From elevated  surface           General transfer comment: Bed to bsc, ambulated <10 steps to recliner     Balance Overall balance assessment: Needs assistance Sitting-balance support: Bilateral upper extremity supported, Feet supported Sitting balance-Leahy Scale: Poor Sitting balance - Comments: Fluctuated btw poor to zero Postural control: Posterior lean Standing balance support: Reliant on assistive device for balance, Bilateral upper extremity supported, During functional activity Standing balance-Leahy Scale: Poor Standing balance comment: modA +1-2 for safety with standing tasks using RW                           ADL either performed or assessed with clinical judgement   ADL Overall ADL's : Needs assistance/impaired                         Toilet Transfer: Maximal assistance;+2 for physical assistance;BSC/3in1;Rolling walker (2 wheels);Stand-pivot;Cueing for safety;Cueing for sequencing Toilet Transfer Details (indicate cue type and reason): Cueing for sequencing Toileting- Clothing Manipulation and Hygiene: Total assistance;+2 for physical assistance;Sit to/from stand Toileting - Clothing Manipulation Details (indicate cue type and reason): Assist to maintain standing, +2 to wipe     Functional mobility during ADLs: Maximal assistance;Cueing for safety;Cueing for sequencing;Rolling walker (2 wheels);Moderate assistance      Extremity/Trunk Assessment Upper Extremity Assessment Upper Extremity Assessment: Generalized weakness   Lower Extremity Assessment Lower Extremity Assessment: Generalized weakness        Vision   Vision Assessment?: No apparent visual deficits   Perception     Praxis     Communication Communication Communication: No apparent difficulties   Cognition Arousal: Alert Behavior During Therapy: WFL for tasks assessed/performed Cognition: History of  cognitive impairments, Cognition impaired     Awareness: Online awareness  impaired Memory impairment (select all impairments): Short-term memory, Working Civil Service fast streamer, Armed forces training and education officer functioning impairment (select all impairments): Organization, Sequencing, Reasoning, Problem solving                   Following commands: Intact Following commands impaired: Follows one step commands with increased time, Only follows one step commands consistently      Cueing   Cueing Techniques: Verbal cues, Gestural cues  Exercises      Shoulder Instructions       General Comments Caregiver present for session and able to describe assistance he receives at home    Pertinent Vitals/ Pain       Pain Assessment Pain Assessment: No/denies pain  Home Living Family/patient expects to be discharged to:: Private residence                                        Prior Functioning/Environment              Frequency  Min 1X/week        Progress Toward Goals  OT Goals(current goals can now be found in the care plan section)  Progress towards OT goals: Progressing toward goals  Acute Rehab OT Goals Patient Stated Goal: None stated OT Goal Formulation: With patient Time For Goal Achievement: 05/17/23 Potential to Achieve Goals: Good ADL Goals Pt Will Perform Upper Body Dressing: with supervision;sitting Pt Will Perform Lower Body Dressing: with min assist;sit to/from stand;sitting/lateral leans Pt Will Transfer to Toilet: with min assist;ambulating;regular height toilet Additional ADL Goal #1: pt will tolerate OOB activity x10 min with VSS in prep for ADLs  Plan      Co-evaluation                 AM-PAC OT "6 Clicks" Daily Activity     Outcome Measure   Help from another person eating meals?: A Little Help from another person taking care of personal grooming?: A Little Help from another person toileting, which includes using toliet, bedpan, or urinal?: A Lot Help from another person bathing (including  washing, rinsing, drying)?: A Lot Help from another person to put on and taking off regular upper body clothing?: A Little Help from another person to put on and taking off regular lower body clothing?: A Lot 6 Click Score: 15    End of Session Equipment Utilized During Treatment: Gait belt;Rolling walker (2 wheels)  OT Visit Diagnosis: Unsteadiness on feet (R26.81);Other abnormalities of gait and mobility (R26.89);Muscle weakness (generalized) (M62.81);Other symptoms and signs involving cognitive function   Activity Tolerance Patient tolerated treatment well   Patient Left in chair;with call bell/phone within reach;with chair alarm set   Nurse Communication Mobility status        Time: 1140-1210 OT Time Calculation (min): 30 min  Charges: OT General Charges $OT Visit: 1 Visit OT Treatments $Self Care/Home Management : 23-37 mins  Ivor Messier, OT  Acute Rehabilitation Services Office [phone] Secure chat preferred   Marilynne Drivers 05/09/2023, 3:22 PM

## 2023-05-09 NOTE — Telephone Encounter (Signed)
 Noted.

## 2023-05-11 ENCOUNTER — Institutional Professional Consult (permissible substitution): Payer: HMO | Admitting: Neurology

## 2023-05-29 ENCOUNTER — Ambulatory Visit
Admission: RE | Admit: 2023-05-29 | Discharge: 2023-05-29 | Disposition: A | Discharge status: Home / Self Care | Source: Ambulatory Visit | Attending: Family Medicine | Admitting: Family Medicine

## 2023-05-29 ENCOUNTER — Other Ambulatory Visit: Payer: Self-pay | Admitting: Family Medicine

## 2023-05-29 DIAGNOSIS — J189 Pneumonia, unspecified organism: Secondary | ICD-10-CM

## 2023-07-02 ENCOUNTER — Other Ambulatory Visit: Payer: Self-pay | Admitting: Cardiology

## 2023-07-02 DIAGNOSIS — I482 Chronic atrial fibrillation, unspecified: Secondary | ICD-10-CM

## 2023-07-03 NOTE — Telephone Encounter (Signed)
 Prescription refill request for Pradaxa  received.  Indication:afib Last office visit:7/24 Weight:47  kg Age:83 Scr:1.08  3/25 CrCl:33.85  ml/min  Prescription refilled

## 2023-07-07 ENCOUNTER — Other Ambulatory Visit: Payer: Self-pay | Admitting: Internal Medicine

## 2023-07-17 ENCOUNTER — Encounter: Payer: Self-pay | Admitting: Podiatry

## 2023-07-17 ENCOUNTER — Ambulatory Visit: Payer: HMO | Admitting: Podiatry

## 2023-07-17 DIAGNOSIS — D689 Coagulation defect, unspecified: Secondary | ICD-10-CM | POA: Diagnosis not present

## 2023-07-17 DIAGNOSIS — B351 Tinea unguium: Secondary | ICD-10-CM | POA: Diagnosis not present

## 2023-07-17 DIAGNOSIS — M79674 Pain in right toe(s): Secondary | ICD-10-CM | POA: Diagnosis not present

## 2023-07-17 DIAGNOSIS — M79675 Pain in left toe(s): Secondary | ICD-10-CM

## 2023-07-17 NOTE — Progress Notes (Signed)
 This patient returns to my office for at risk foot care.  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  Patient is taking pradaxa .This patient is unable to cut nails himself since the patient cannot reach his nails.These nails are painful walking and wearing shoes.  This patient presents for at risk foot care today.  General Appearance  Alert, conversant and in no acute stress.  Vascular  Dorsalis pedis  are weakly  palpable  bilaterally. Posterior tibial pulses are absent  Bilateral. Capillary return is within normal limits  Bilateral.  Cold feet.  Bilaterally. Absent hair noted.  Neurologic  Senn-Weinstein monofilament wire test within normal limits  bilaterally. Muscle power within normal limits bilaterally.  Nails Thick disfigured discolored nails with subungual debris  from hallux to fifth toes bilaterally. No evidence of bacterial infection or drainage bilaterally.  Orthopedic  No limitations of motion  feet .  No crepitus or effusions noted.  No bony pathology or digital deformities noted.  Hammer toes 2,3 right foot.  Skin  normotropic skin with no porokeratosis noted bilaterally.  No signs of infections or ulcers noted.   Onychomycosis  Pain in right toes  Pain in left toes  Consent was obtained for treatment procedures.   Mechanical debridement of nails 1-5  bilaterally performed with a nail nipper.  Filed with dremel without incident.    Return office visit   3 months.                  Told patient to return for periodic foot care and evaluation due to potential at risk complications.   Ruffin Cotton DPM

## 2023-09-03 NOTE — Progress Notes (Unsigned)
 [name]. Date of Birth: 1940/08/31 Medical Record #[id number]  History of Present Illness: [name] is seen for follow up Afib.He has a history of atrial fibrillation dating back to 2012. Initially this was paroxysmal but multiple Ecgs since then show persistent Afib with controlled rate. He is managed with metoprolol , diltiazem , and pradaxa .   He was seen in the Afib clinic this past June with elevated HR in Afib. Cardizem  dose was increased. He had been diagnosed with multiple myeloma and was receiving chemotherapy.  He was admitted in Dec 2023 with fever in setting of neutropenia. Tested positive for RSV.  On follow up he continues with therapy with Daratumumab once a month. On Revlimid.  He is tolerating this well.   He was admitted in Dec 2024 with progressive weakness, dehydration and Rhabdo.   He was admitted in March with acute respiratory failure requiring ventilation. Had the Flu. Lactic acidosis and sepsis.   Allergies as of 09/05/2023       Reactions   Proair  Hfa [albuterol ] Palpitations   Only use in emergency due to has heart problems        Medication List        Accurate as of September 03, 2023  1:44 PM. If you have any questions, ask your nurse or doctor.          ACE Aerosol Cloud Enhancer Misc Inhale into the lungs See admin instructions.   acetaminophen  325 MG tablet Commonly known as: TYLENOL  Place 2 tablets (650 mg total) into feeding tube every 4 (four) hours as needed for mild pain (pain score 1-3) (temp > 101.5).   acyclovir  400 MG tablet Commonly known as: ZOVIRAX  Take 400 mg by mouth 2 (two) times daily.   AMBULATORY NON FORMULARY MEDICATION CPAP at night   calcium  citrate-vitamin D  315-200 MG-UNIT tablet Commonly known as: CITRACAL+D Take 2 tablets by mouth daily.   DARATUMUMAB IV Inject 1 Dose into the skin every 30 (thirty) days.   diltiazem  180 MG 24 hr capsule Commonly known as: CARDIZEM  CD Take 1 capsule (180 mg total)  by mouth daily.   docusate sodium  100 MG capsule Commonly known as: COLACE Take 1 capsule (100 mg total) by mouth 2 (two) times daily.   donepezil  10 MG tablet Commonly known as: ARICEPT  Take 10 mg by mouth at bedtime.   dutasteride  0.5 MG capsule Commonly known as: AVODART  Take 0.5 mg by mouth daily.   fluticasone -salmeterol 250-50 MCG/ACT Aepb Commonly known as: ADVAIR Inhale 1 puff into the lungs 2 (two) times daily.   melatonin 3 MG Tabs tablet Take 1 tablet (3 mg total) by mouth at bedtime as needed.   memantine  10 MG tablet Commonly known as: NAMENDA  Take 10 mg by mouth 2 (two) times daily.   metoprolol  succinate 25 MG 24 hr tablet Commonly known as: TOPROL -XL Take 25 mg by mouth every morning.   montelukast  10 MG tablet Commonly known as: SINGULAIR  TAKE 1 TABLET(10 MG) BY MOUTH AT BEDTIME What changed: See the new instructions.   multivitamin with minerals tablet Take 1 tablet by mouth daily.   omeprazole  20 MG capsule Commonly known as: PRILOSEC TAKE ONE CAPSULE BY MOUTH EVERY DAY   phenol 1.4 % Liqd Commonly known as: CHLORASEPTIC Use as directed 1 spray in the mouth or throat as needed for throat irritation / pain.   polyethylene glycol 17 g packet Commonly known as: MIRALAX  / GLYCOLAX  Take 17 g by mouth daily.  potassium chloride  10 MEQ CR capsule Commonly known as: MICRO-K  Take 2 capsules (20 mEq total) by mouth daily for 7 days.   Pradaxa  150 MG Caps capsule Generic drug: dabigatran  TAKE ONE CAPSULE BY MOUTH TWICE DAILY   pravastatin  20 MG tablet Commonly known as: PRAVACHOL  Take 20 mg daily by mouth.   tadalafil  5 MG tablet Commonly known as: CIALIS  Take 1 tablet by mouth daily.   zoledronic acid 4 MG/5ML injection Commonly known as: ZOMETA Inject 4 mg into the vein once. Yearly         Allergies  Allergen Reactions   Proair  Hfa [Albuterol ] Palpitations    Only use in emergency due to has heart problems    Past Medical  History:  Diagnosis Date   Allergy    Arrhythmia    Atrial fibrillation (HCC)    Barrett esophagus    Barrett's esophagus    Blood transfusion without reported diagnosis    BPH (benign prostatic hyperplasia)    Cervical stenosis of spine    Chronic obstructive asthma    Dementia (HCC)    Difficult intubation 13 or 14 years ago, at [facility] long   airway was up near nostrils with intubation per patient with bowel surgery   ED (erectile dysfunction)    GERD (gastroesophageal reflux disease)    Hammer toe of right foot    Hiatal hernia    Hyperlipidemia    Hypertension    OSA (obstructive sleep apnea) 08/05/10 [facility]   AHI 24   Paroxysmal atrial fibrillation (HCC)    Hospitalized on 05/27/10   Renal cell carcinoma 2009   right renal mass, followed by Dr. Roslynn   Sleep apnea     Past Surgical History:  Procedure Laterality Date   BASAL CELL CARCINOMA EXCISION     BIOPSY  12/18/2017   Procedure: BIOPSY;  Surgeon: Aneita Gwendlyn DASEN, MD;  Location: [facility];  Service: Endoscopy;;   ESOPHAGOGASTRODUODENOSCOPY (EGD) WITH PROPOFOL  N/A 08/26/2014   Procedure: ESOPHAGOGASTRODUODENOSCOPY (EGD) WITH PROPOFOL ;  Surgeon: Gwendlyn DASEN Aneita, MD;  Location: [facility];  Service: Endoscopy;  Laterality: N/A;   ESOPHAGOGASTRODUODENOSCOPY (EGD) WITH PROPOFOL  N/A 12/18/2017   Procedure: ESOPHAGOGASTRODUODENOSCOPY (EGD) WITH PROPOFOL ;  Surgeon: Aneita Gwendlyn DASEN, MD;  Location: [facility];  Service: Endoscopy;  Laterality: N/A;   EXPLORATORY LAPAROTOMY W/ BOWEL RESECTION  12/1999   segmental sbr of a hemangioma, repair of incarcerated umbilical hernia   exploratory laparotomy, segmental small bowel resection of a hemangioma, repair of incarcerated umbelical hernia  12/1999   EYE SURGERY     cataracts done   renal cancer surgery with RF Ablation     TONSILLECTOMY      Social History   Socioeconomic History   Marital status: Divorced    Spouse name: Not on file   Number of children: 4    Years of education: Not on file   Highest education level: Not on file  Occupational History   Occupation: tv producer/construction   Occupation: televison producer  Tobacco Use   Smoking status: Former    Types: Cigars    Quit date: 02/29/1980    Years since quitting: 43.5    Passive exposure: Past   Smokeless tobacco: Never   Tobacco comments:    quit when he was 83 years old  Vaping Use   Vaping status: Never Used  Substance and Sexual Activity   Alcohol use: No   Drug use: No   Sexual activity: Not Currently  Other Topics Concern  Not on file  Social History Narrative   ** Merged History Encounter ** Married. Education: [employer]. Exercise: Eleptical times a week for 45 minutes.          Social Drivers of Corporate investment banker Strain: Not on file  Food Insecurity: No Food Insecurity (05/01/2023)   Hunger Vital Sign    Worried About Running Out of Food in the Last Year: Never true    Ran Out of Food in the Last Year: Never true  Transportation Needs: No Transportation Needs (05/01/2023)   PRAPARE - Administrator, Civil Service (Medical): No    Lack of Transportation (Non-Medical): No  Physical Activity: Not on file  Stress: Not on file  Social Connections: Patient Unable To Answer (05/01/2023)   Social Connection and Isolation Panel    Frequency of Communication with Friends and Family: Patient unable to answer    Frequency of Social Gatherings with Friends and Family: Patient unable to answer    Attends Religious Services: Patient unable to answer    Active Member of Clubs or Organizations: Patient unable to answer    Attends Banker Meetings: Patient unable to answer    Marital Status: Patient unable to answer    Family History  Problem Relation Age of Onset   Stroke Mother    Diabetes Mother    Diabetes Father    Obesity Brother    Diabetes Brother    Kidney cancer Paternal Aunt    Asthma Paternal Grandmother    Heart attack  Paternal Grandfather    Colon cancer Neg Hx    Esophageal cancer Neg Hx    Rectal cancer Neg Hx    Stomach cancer Neg Hx    Dementia Neg Hx    Alzheimer's disease Neg Hx     Review of Systems: As noted in HPI.  All other systems were reviewed and are negative.  Physical Exam: There were no vitals taken for this visit. There were no vitals filed for this visit.    GENERAL:  frail appearing  NAD HEENT:  PERRL, EOMI, sclera are clear. Oropharynx is clear. NECK:  No jugular venous distention, carotid upstroke brisk and symmetric, no bruits, no thyromegaly or adenopathy LUNGS:  Clear to auscultation bilaterally CHEST:  Unremarkable HEART:  IRRR,  PMI not displaced or sustained,S1 and S2 within normal limits, no S3, no S4: no clicks, no rubs, no murmurs ABD:  Soft, nontender. BS +, no masses or bruits. No hepatomegaly, no splenomegaly EXT:  2 + pulses throughout, no edema SKIN:  Warm and dry.  No rashes NEURO:  Alert and oriented x 3. Cranial nerves II through XII intact. PSYCH:  Cognitively intact   LABORATORY DATA:  Lab Results  Component Value Date   WBC 4.3 05/05/2023   HGB 11.0 (L) 05/05/2023   HCT 31.9 (L) 05/05/2023   PLT 96 (L) 05/05/2023   GLUCOSE 107 (H) 05/05/2023   CHOL 124 11/08/2017   TRIG 60 05/02/2023   HDL 49 11/08/2017   LDLCALC 64 11/08/2017   ALT 19 05/01/2023   AST 26 05/01/2023   NA 138 05/05/2023   K 3.4 (L) 05/05/2023   CL 104 05/05/2023   CREATININE 1.08 05/05/2023   BUN 14 05/05/2023   CO2 27 05/05/2023   TSH 1.107 02/01/2023   PSA 2.79 11/19/2012   INR 1.1 05/01/2023   HGBA1C 5.3 05/01/2023   Dated 03/26/20: Hgb 11.2. WBC 2.6. plts 154k. CMET normal Dated 09/10/20:  Hgb 12.5, WBC 5.7, plts 148K. CMET normal.         Assessment / Plan: 1. Atrial fibrillation. Permanent.  HR is well controlled on Cardizem   180 mg daily. Continue Toprol  XL.  Continue  Pradaxa .  I will follow up in one year  2. CKD stage 2. Stable.  3. Multiple  myeloma. Per heme-onc  4. LE edema. Exam is normal today. Continue compression hose

## 2023-09-05 ENCOUNTER — Encounter: Payer: Self-pay | Admitting: Cardiology

## 2023-09-05 ENCOUNTER — Ambulatory Visit: Attending: Cardiology | Admitting: Cardiology

## 2023-09-05 VITALS — BP 112/56 | HR 73 | Ht 70.0 in | Wt 184.8 lb

## 2023-09-05 DIAGNOSIS — D6869 Other thrombophilia: Secondary | ICD-10-CM | POA: Diagnosis not present

## 2023-09-05 DIAGNOSIS — I482 Chronic atrial fibrillation, unspecified: Secondary | ICD-10-CM | POA: Diagnosis not present

## 2023-09-05 NOTE — Patient Instructions (Signed)
 Medication Instructions:  Continue same medications *If you need a refill on your cardiac medications before your next appointment, please call your pharmacy*  Lab Work: None ordered  Testing/Procedures: None ordered  Follow-Up: At [facility], you and your health needs are our priority.  As part of our continuing mission to provide you with exceptional heart care, our providers are all part of one team.  This team includes your primary Cardiologist (physician) and Advanced Practice Providers or APPs (Physician Assistants and Nurse Practitioners) who all work together to provide you with the care you need, when you need it.  Your next appointment:  1 year    Call in March To schedule July appointment     Provider:  Dr.Jordan   We recommend signing up for the patient portal called MyChart.  Sign up information is provided on this After Visit Summary.  MyChart is used to connect with patients for Virtual Visits (Telemedicine).  Patients are able to view lab/test results, encounter notes, upcoming appointments, etc.  Non-urgent messages can be sent to your provider as well.   To learn more about what you can do with MyChart, go to [email or web address].

## 2023-10-17 ENCOUNTER — Encounter: Payer: Self-pay | Admitting: Podiatry

## 2023-10-17 ENCOUNTER — Ambulatory Visit: Admitting: Podiatry

## 2023-10-17 DIAGNOSIS — M79675 Pain in left toe(s): Secondary | ICD-10-CM

## 2023-10-17 DIAGNOSIS — B351 Tinea unguium: Secondary | ICD-10-CM

## 2023-10-17 DIAGNOSIS — D689 Coagulation defect, unspecified: Secondary | ICD-10-CM | POA: Diagnosis not present

## 2023-10-17 DIAGNOSIS — M79674 Pain in right toe(s): Secondary | ICD-10-CM | POA: Diagnosis not present

## 2023-10-17 NOTE — Progress Notes (Signed)
 This patient returns to my office for at risk foot care.  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  Patient is taking pradaxa .This patient is unable to cut nails himself since the patient cannot reach his nails.These nails are painful walking and wearing shoes.  This patient presents for at risk foot care today.  General Appearance  Alert, conversant and in no acute stress.  Vascular  Dorsalis pedis  are weakly  palpable  bilaterally. Posterior tibial pulses are absent  Bilateral. Capillary return is within normal limits  Bilateral.  Cold feet.  Bilaterally. Absent hair noted.  Neurologic  Senn-Weinstein monofilament wire test within normal limits  bilaterally. Muscle power within normal limits bilaterally.  Nails Thick disfigured discolored nails with subungual debris  from hallux to fifth toes bilaterally. No evidence of bacterial infection or drainage bilaterally.  Orthopedic  No limitations of motion  feet .  No crepitus or effusions noted.  No bony pathology or digital deformities noted.  Hammer toes 2,3 right foot.  Skin  normotropic skin with no porokeratosis noted bilaterally.  No signs of infections or ulcers noted.   Onychomycosis  Pain in right toes  Pain in left toes  Consent was obtained for treatment procedures.   Mechanical debridement of nails 1-5  bilaterally performed with a nail nipper.  Filed with dremel without incident.    Return office visit   3 months.                  Told patient to return for periodic foot care and evaluation due to potential at risk complications.   Ruffin Cotton DPM

## 2023-12-28 ENCOUNTER — Other Ambulatory Visit: Payer: Self-pay | Admitting: Cardiology

## 2023-12-28 DIAGNOSIS — I482 Chronic atrial fibrillation, unspecified: Secondary | ICD-10-CM

## 2023-12-28 NOTE — Telephone Encounter (Signed)
 Male, age 82, wt 184#, Scr 1.08 epic 3/7/2, CrCl 59 ml/min

## 2024-01-01 ENCOUNTER — Encounter: Payer: Self-pay | Admitting: Radiology

## 2024-01-08 ENCOUNTER — Ambulatory Visit: Admitting: Podiatry

## 2024-01-11 ENCOUNTER — Ambulatory Visit: Admitting: Podiatry

## 2024-01-11 DIAGNOSIS — B351 Tinea unguium: Secondary | ICD-10-CM | POA: Diagnosis not present

## 2024-01-11 DIAGNOSIS — D689 Coagulation defect, unspecified: Secondary | ICD-10-CM | POA: Diagnosis not present

## 2024-01-11 DIAGNOSIS — L84 Corns and callosities: Secondary | ICD-10-CM

## 2024-01-11 DIAGNOSIS — M79675 Pain in left toe(s): Secondary | ICD-10-CM

## 2024-01-11 DIAGNOSIS — M79674 Pain in right toe(s): Secondary | ICD-10-CM

## 2024-01-13 NOTE — Progress Notes (Signed)
 Subjective:   Patient ID: [name]., male   DOB: 84 y.o.   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

## 2024-01-23 ENCOUNTER — Ambulatory Visit: Admitting: Podiatry

## 2024-04-12 ENCOUNTER — Ambulatory Visit: Admitting: Podiatry
